# Patient Record
Sex: Male | Born: 1950
Health system: Southern US, Academic
[De-identification: ages and names within clinical notes are randomized; demographics above are authoritative.]

## PROBLEM LIST (undated history)

## (undated) ENCOUNTER — Encounter

## (undated) ENCOUNTER — Encounter: Attending: Family | Primary: Family

## (undated) ENCOUNTER — Ambulatory Visit: Payer: MEDICARE

## (undated) ENCOUNTER — Ambulatory Visit

## (undated) ENCOUNTER — Telehealth

## (undated) ENCOUNTER — Encounter
Attending: Pharmacist Clinician (PhC)/ Clinical Pharmacy Specialist | Primary: Pharmacist Clinician (PhC)/ Clinical Pharmacy Specialist

## (undated) ENCOUNTER — Ambulatory Visit: Payer: MEDICARE | Attending: Family | Primary: Family

## (undated) ENCOUNTER — Telehealth: Attending: Family | Primary: Family

## (undated) ENCOUNTER — Telehealth
Attending: Pharmacist Clinician (PhC)/ Clinical Pharmacy Specialist | Primary: Pharmacist Clinician (PhC)/ Clinical Pharmacy Specialist

## (undated) ENCOUNTER — Ambulatory Visit
Attending: Pharmacist Clinician (PhC)/ Clinical Pharmacy Specialist | Primary: Pharmacist Clinician (PhC)/ Clinical Pharmacy Specialist

## (undated) ENCOUNTER — Encounter: Attending: Internal Medicine | Primary: Internal Medicine

## (undated) ENCOUNTER — Ambulatory Visit: Attending: Family | Primary: Family

## (undated) DIAGNOSIS — K7030 Alcoholic cirrhosis of liver without ascites: Secondary | ICD-10-CM

## (undated) DIAGNOSIS — C649 Malignant neoplasm of unspecified kidney, except renal pelvis: Secondary | ICD-10-CM

## (undated) DIAGNOSIS — G4730 Sleep apnea, unspecified: Secondary | ICD-10-CM

## (undated) DIAGNOSIS — I2510 Atherosclerotic heart disease of native coronary artery without angina pectoris: Secondary | ICD-10-CM

## (undated) DIAGNOSIS — N289 Disorder of kidney and ureter, unspecified: Secondary | ICD-10-CM

## (undated) DIAGNOSIS — D696 Thrombocytopenia, unspecified: Secondary | ICD-10-CM

## (undated) DIAGNOSIS — C801 Malignant (primary) neoplasm, unspecified: Secondary | ICD-10-CM

## (undated) DIAGNOSIS — D691 Qualitative platelet defects: Secondary | ICD-10-CM

## (undated) DIAGNOSIS — K7460 Unspecified cirrhosis of liver: Secondary | ICD-10-CM

## (undated) DIAGNOSIS — I10 Essential (primary) hypertension: Secondary | ICD-10-CM

## (undated) DIAGNOSIS — E079 Disorder of thyroid, unspecified: Secondary | ICD-10-CM

## (undated) DIAGNOSIS — J449 Chronic obstructive pulmonary disease, unspecified: Secondary | ICD-10-CM

## (undated) HISTORY — DX: Thrombocytopenia, unspecified: D69.6

## (undated) HISTORY — PX: TESTICLE REMOVAL: SHX68

## (undated) HISTORY — PX: OTHER SURGICAL HISTORY: SHX169

## (undated) MED ORDER — FUROSEMIDE 40 MG TABLET
Freq: Every day | ORAL | 0.00000 days
Start: ? — End: 2020-08-10

## (undated) MED ORDER — OLMESARTAN 5 MG TABLET
0 days
Start: ? — End: 2020-07-27

---
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
## 1998-02-18 ENCOUNTER — Ambulatory Visit (HOSPITAL_COMMUNITY): Admission: RE | Admit: 1998-02-18 | Discharge: 1998-02-18 | Payer: Self-pay | Admitting: *Deleted

## 2000-07-04 ENCOUNTER — Encounter: Payer: Self-pay | Admitting: *Deleted

## 2000-07-04 ENCOUNTER — Encounter: Admission: RE | Admit: 2000-07-04 | Discharge: 2000-07-04 | Payer: Self-pay | Admitting: *Deleted

## 2004-11-16 ENCOUNTER — Encounter: Admission: RE | Admit: 2004-11-16 | Discharge: 2004-11-16 | Payer: Self-pay | Admitting: Gastroenterology

## 2005-05-31 ENCOUNTER — Encounter: Admission: RE | Admit: 2005-05-31 | Discharge: 2005-05-31 | Payer: Self-pay | Admitting: Family Medicine

## 2010-05-08 ENCOUNTER — Ambulatory Visit: Payer: Self-pay | Admitting: Oncology

## 2010-05-18 LAB — CBC WITH DIFFERENTIAL/PLATELET
BASO%: 0.6 % (ref 0.0–2.0)
Basophils Absolute: 0 10*3/uL (ref 0.0–0.1)
EOS%: 2.1 % (ref 0.0–7.0)
Eosinophils Absolute: 0.1 10*3/uL (ref 0.0–0.5)
HCT: 41 % (ref 38.4–49.9)
HGB: 14.5 g/dL (ref 13.0–17.1)
LYMPH%: 43.7 % (ref 14.0–49.0)
MCH: 34.5 pg — ABNORMAL HIGH (ref 27.2–33.4)
MCHC: 35.4 g/dL (ref 32.0–36.0)
MCV: 97.5 fL (ref 79.3–98.0)
MONO#: 0.4 10*3/uL (ref 0.1–0.9)
MONO%: 15.9 % — ABNORMAL HIGH (ref 0.0–14.0)
NEUT#: 1 10*3/uL — ABNORMAL LOW (ref 1.5–6.5)
NEUT%: 37.7 % — ABNORMAL LOW (ref 39.0–75.0)
Platelets: 75 10*3/uL — ABNORMAL LOW (ref 140–400)
RBC: 4.21 10*6/uL (ref 4.20–5.82)
RDW: 13.2 % (ref 11.0–14.6)
WBC: 2.7 10*3/uL — ABNORMAL LOW (ref 4.0–10.3)
lymph#: 1.2 10*3/uL (ref 0.9–3.3)

## 2010-05-18 LAB — PROTHROMBIN TIME
INR: 1.04 (ref <1.50)
Prothrombin Time: 13.8 seconds (ref 11.6–15.2)

## 2010-05-18 LAB — COMPREHENSIVE METABOLIC PANEL
AST: 46 U/L — ABNORMAL HIGH (ref 0–37)
Alkaline Phosphatase: 128 U/L — ABNORMAL HIGH (ref 39–117)
BUN: 12 mg/dL (ref 6–23)
Calcium: 8.9 mg/dL (ref 8.4–10.5)
Chloride: 105 mEq/L (ref 96–112)
Creatinine, Ser: 1.12 mg/dL (ref 0.40–1.50)
Total Bilirubin: 0.7 mg/dL (ref 0.3–1.2)

## 2010-05-18 LAB — APTT: aPTT: 36 seconds (ref 24–37)

## 2010-05-18 LAB — CHCC SMEAR

## 2010-05-25 ENCOUNTER — Ambulatory Visit (HOSPITAL_COMMUNITY): Admission: RE | Admit: 2010-05-25 | Discharge: 2010-05-25 | Payer: Self-pay | Admitting: Oncology

## 2011-10-05 ENCOUNTER — Other Ambulatory Visit: Payer: Self-pay | Admitting: *Deleted

## 2011-10-05 ENCOUNTER — Other Ambulatory Visit: Payer: Self-pay | Admitting: Oncology

## 2011-10-05 ENCOUNTER — Ambulatory Visit: Payer: Self-pay

## 2011-10-05 DIAGNOSIS — D649 Anemia, unspecified: Secondary | ICD-10-CM

## 2011-12-05 ENCOUNTER — Telehealth: Payer: Self-pay | Admitting: Oncology

## 2011-12-05 NOTE — Telephone Encounter (Signed)
S/w pt re appt for 5/21 @ 3 pm w/FS. Re-establish care - 30 min.

## 2011-12-11 ENCOUNTER — Other Ambulatory Visit (HOSPITAL_BASED_OUTPATIENT_CLINIC_OR_DEPARTMENT_OTHER): Payer: Self-pay | Admitting: Lab

## 2011-12-11 ENCOUNTER — Ambulatory Visit: Payer: Self-pay

## 2011-12-11 ENCOUNTER — Telehealth: Payer: Self-pay | Admitting: Oncology

## 2011-12-11 ENCOUNTER — Encounter: Payer: Self-pay | Admitting: Oncology

## 2011-12-11 ENCOUNTER — Ambulatory Visit (HOSPITAL_BASED_OUTPATIENT_CLINIC_OR_DEPARTMENT_OTHER): Payer: Self-pay | Admitting: Oncology

## 2011-12-11 ENCOUNTER — Other Ambulatory Visit: Payer: Self-pay | Admitting: Oncology

## 2011-12-11 VITALS — BP 161/83 | HR 80 | Temp 98.0°F | Ht 68.0 in | Wt 150.1 lb

## 2011-12-11 DIAGNOSIS — D696 Thrombocytopenia, unspecified: Secondary | ICD-10-CM

## 2011-12-11 DIAGNOSIS — D709 Neutropenia, unspecified: Secondary | ICD-10-CM

## 2011-12-11 DIAGNOSIS — R7989 Other specified abnormal findings of blood chemistry: Secondary | ICD-10-CM

## 2011-12-11 HISTORY — DX: Thrombocytopenia, unspecified: D69.6

## 2011-12-11 LAB — CBC WITH DIFFERENTIAL/PLATELET
Eosinophils Absolute: 0 10*3/uL (ref 0.0–0.5)
HGB: 13.8 g/dL (ref 13.0–17.1)
LYMPH%: 40.3 % (ref 14.0–49.0)
MONO#: 0.6 10*3/uL (ref 0.1–0.9)
NEUT#: 1.6 10*3/uL (ref 1.5–6.5)
Platelets: 64 10*3/uL — ABNORMAL LOW (ref 140–400)
RBC: 4.05 10*6/uL — ABNORMAL LOW (ref 4.20–5.82)
WBC: 3.8 10*3/uL — ABNORMAL LOW (ref 4.0–10.3)
nRBC: 0 % (ref 0–0)

## 2011-12-11 LAB — COMPREHENSIVE METABOLIC PANEL
ALT: 33 U/L (ref 0–53)
CO2: 20 mEq/L (ref 19–32)
Calcium: 8.8 mg/dL (ref 8.4–10.5)
Chloride: 103 mEq/L (ref 96–112)
Creatinine, Ser: 1.28 mg/dL (ref 0.50–1.35)
Sodium: 134 mEq/L — ABNORMAL LOW (ref 135–145)
Total Protein: 7.4 g/dL (ref 6.0–8.3)

## 2011-12-11 LAB — CHCC SMEAR

## 2011-12-11 NOTE — Telephone Encounter (Signed)
appts made and printed for pt aom °

## 2011-12-11 NOTE — Progress Notes (Signed)
Hematology and Oncology Follow Up Visit  NAHJEE KARSTETTER [id number] May 28, 1951 61 y.o. 12/11/2011 4:19 PM   Principle Diagnosis: This is a 61 year old gentleman with thrombocytopenia and neutropenia most likely related to his chronic liver disease.   Interim History: Mr. Hoobler presents today for a follow-up visit.  I saw him for the first time on 04/2010 to evaluation for chronic fluctuating thrombocytopenia and mild neutropenia.  Upon my evaluation at that time, his platelet counts was 75.  His absolute neutrophil count was around 1000.  His coagulation parameters all within normal range.  His chemistries showed his AST is slightly elevated at 46 and his alkaline phosphatase is elevated at 128 and also his workup included abdominal ultrasound was done on 11/03/2011the ultrasound did not show any evidence of any splenic abnormalities.  There is no acute process.  He still has a mildly heterogenous appearance of the hepatic parenchyma without any focal abnormalities.  The size of the spleen is measuring 8 x 9 x 4 cm.  Mr. Egeland has continued to be asymptomatic from this.  He has not had any bleeding.  He did not report any major complaints.  He has not had any opportunistic infection.  He still drinks about three to four drinks at night predominantly wine. No bleeding noted since the last visit.   Medications: I have reviewed the patient's current medications. Current outpatient prescriptions:diltiazem (CARDIZEM) 90 MG tablet, Take 90 mg by mouth 2 (two) times daily., Disp: , Rfl: ;  levothyroxine (SYNTHROID, LEVOTHROID) 50 MCG tablet, Take 50 mcg by mouth Daily., Disp: , Rfl: ;  lisinopril (PRINIVIL,ZESTRIL) 40 MG tablet, Take 40 mg by mouth daily., Disp: , Rfl:   Allergies: No Known Allergies  Past Medical History, Surgical history, Social history, and Family History were reviewed and updated.  Review of Systems: Constitutional:  Negative for fever, chills, night sweats, anorexia, weight loss,  pain. Cardiovascular: no chest pain or dyspnea on exertion Respiratory: negative Neurological: negative Dermatological: negative ENT: negative Skin: Negative. Gastrointestinal: negative Genito-Urinary: negative Hematological and Lymphatic: negative Breast: negative Musculoskeletal: negative Remaining ROS negative. Physical Exam: Blood pressure 161/83, pulse 80, temperature 98 F (36.7 C), temperature source Oral, height 5\' 8"  (1.727 m), weight 150 lb 1.6 oz (68.085 kg). ECOG: 1 General appearance: alert Head: Normocephalic, without obvious abnormality, atraumatic Neck: no adenopathy, no carotid bruit, no JVD, supple, symmetrical, trachea midline and thyroid not enlarged, symmetric, no tenderness/mass/nodules Lymph nodes: Cervical, supraclavicular, and axillary nodes normal. Heart:regular rate and rhythm, S1, S2 normal, no murmur, click, rub or gallop Lung:chest clear, no wheezing, rales, normal symmetric air entry Abdomin: soft, non-tender, without masses or organomegaly EXT:no erythema, induration, or nodules   Lab Results: Lab Results  Component Value Date   WBC 3.8* 12/11/2011   HGB 13.8 12/11/2011   HCT 40.3 12/11/2011   MCV 99.5* 12/11/2011   PLT 64* 12/11/2011     Chemistry      Component Value Date/Time   NA 135 05/18/2010 1033   K 3.6 05/18/2010 1033   CL 105 05/18/2010 1033   CO2 22 05/18/2010 1033   BUN 12 05/18/2010 1033   CREATININE 1.12 05/18/2010 1033      Component Value Date/Time   CALCIUM 8.9 05/18/2010 1033   ALKPHOS 128* 05/18/2010 1033   AST 46* 05/18/2010 1033   ALT 41 05/18/2010 1033   BILITOT 0.7 05/18/2010 1033       Impression and Plan:  This is a pleasant 61 year old gentleman with the following issues:  1.  Mild neutropenia and thrombocytopenia.  The etiology at this time most likely related to his chronic alcohol consumption presumably autoimmune phenomenon as well as chronic myelosuppression related to alcohol or any other medications.   Other conditions such as myelodysplastic syndrome are unlikeliy.  For the time being we will continue observe him closely and if his counts show any deterioration, we will consider a bone marrow biopsy and for the time being I instructed him to try to cut down his drinks as much as possible and see if there is any improvement in his myelosuppression and I will follow up with him in about 6 months.   2.  Elevation in liver function test likely related to liver disease.   Zola Button, MD 5/21/20134:19 PM

## 2012-06-10 ENCOUNTER — Ambulatory Visit: Payer: PRIVATE HEALTH INSURANCE | Admitting: Oncology

## 2012-06-10 ENCOUNTER — Other Ambulatory Visit: Payer: PRIVATE HEALTH INSURANCE | Admitting: Lab

## 2014-03-10 ENCOUNTER — Other Ambulatory Visit: Payer: Self-pay | Admitting: Gastroenterology

## 2014-03-10 DIAGNOSIS — R7989 Other specified abnormal findings of blood chemistry: Secondary | ICD-10-CM

## 2014-03-10 DIAGNOSIS — R945 Abnormal results of liver function studies: Principal | ICD-10-CM

## 2014-03-16 ENCOUNTER — Ambulatory Visit
Admission: RE | Admit: 2014-03-16 | Discharge: 2014-03-16 | Disposition: A | Discharge status: Home / Self Care | Payer: Medicare Other | Source: Ambulatory Visit | Attending: Gastroenterology | Admitting: Gastroenterology

## 2014-03-16 DIAGNOSIS — R945 Abnormal results of liver function studies: Principal | ICD-10-CM

## 2014-03-16 DIAGNOSIS — R7989 Other specified abnormal findings of blood chemistry: Secondary | ICD-10-CM

## 2014-03-17 ENCOUNTER — Other Ambulatory Visit: Payer: Self-pay | Admitting: Gastroenterology

## 2014-03-17 DIAGNOSIS — R935 Abnormal findings on diagnostic imaging of other abdominal regions, including retroperitoneum: Secondary | ICD-10-CM

## 2014-03-24 ENCOUNTER — Ambulatory Visit
Admission: RE | Admit: 2014-03-24 | Discharge: 2014-03-24 | Disposition: A | Discharge status: Home / Self Care | Payer: Medicare Other | Source: Ambulatory Visit | Attending: Gastroenterology | Admitting: Gastroenterology

## 2014-03-24 DIAGNOSIS — R935 Abnormal findings on diagnostic imaging of other abdominal regions, including retroperitoneum: Secondary | ICD-10-CM

## 2014-03-24 MED ORDER — GADOBENATE DIMEGLUMINE 529 MG/ML IV SOLN
14.0000 mL | Freq: Once | INTRAVENOUS | Status: AC | PRN
  Administered 2014-03-24: 14 mL via INTRAVENOUS

## 2014-07-02 DIAGNOSIS — N179 Acute kidney failure, unspecified: Secondary | ICD-10-CM | POA: Insufficient documentation

## 2014-07-02 DIAGNOSIS — I10 Essential (primary) hypertension: Secondary | ICD-10-CM | POA: Insufficient documentation

## 2014-07-02 DIAGNOSIS — E039 Hypothyroidism, unspecified: Secondary | ICD-10-CM | POA: Insufficient documentation

## 2014-07-20 ENCOUNTER — Emergency Department (HOSPITAL_COMMUNITY)
Admission: EM | Admit: 2014-07-20 | Discharge: 2014-07-20 | Disposition: A | Discharge status: Home / Self Care | Payer: Medicare Other | Attending: Emergency Medicine | Admitting: Emergency Medicine

## 2014-07-20 ENCOUNTER — Emergency Department (HOSPITAL_COMMUNITY): Payer: Medicare Other

## 2014-07-20 ENCOUNTER — Encounter (HOSPITAL_COMMUNITY): Payer: Self-pay | Admitting: Emergency Medicine

## 2014-07-20 DIAGNOSIS — Z79899 Other long term (current) drug therapy: Secondary | ICD-10-CM | POA: Diagnosis not present

## 2014-07-20 DIAGNOSIS — Y836 Removal of other organ (partial) (total) as the cause of abnormal reaction of the patient, or of later complication, without mention of misadventure at the time of the procedure: Secondary | ICD-10-CM | POA: Diagnosis not present

## 2014-07-20 DIAGNOSIS — Z792 Long term (current) use of antibiotics: Secondary | ICD-10-CM | POA: Diagnosis not present

## 2014-07-20 DIAGNOSIS — E079 Disorder of thyroid, unspecified: Secondary | ICD-10-CM | POA: Insufficient documentation

## 2014-07-20 DIAGNOSIS — L24A9 Irritant contact dermatitis due friction or contact with other specified body fluids: Secondary | ICD-10-CM

## 2014-07-20 DIAGNOSIS — Z8669 Personal history of other diseases of the nervous system and sense organs: Secondary | ICD-10-CM | POA: Diagnosis not present

## 2014-07-20 DIAGNOSIS — T148XXA Other injury of unspecified body region, initial encounter: Secondary | ICD-10-CM

## 2014-07-20 DIAGNOSIS — Z8719 Personal history of other diseases of the digestive system: Secondary | ICD-10-CM | POA: Insufficient documentation

## 2014-07-20 DIAGNOSIS — I10 Essential (primary) hypertension: Secondary | ICD-10-CM | POA: Diagnosis not present

## 2014-07-20 DIAGNOSIS — Z87448 Personal history of other diseases of urinary system: Secondary | ICD-10-CM | POA: Insufficient documentation

## 2014-07-20 DIAGNOSIS — I2510 Atherosclerotic heart disease of native coronary artery without angina pectoris: Secondary | ICD-10-CM | POA: Insufficient documentation

## 2014-07-20 DIAGNOSIS — T8189XA Other complications of procedures, not elsewhere classified, initial encounter: Secondary | ICD-10-CM | POA: Diagnosis not present

## 2014-07-20 DIAGNOSIS — Z85528 Personal history of other malignant neoplasm of kidney: Secondary | ICD-10-CM | POA: Diagnosis not present

## 2014-07-20 HISTORY — DX: Qualitative platelet defects: D69.1

## 2014-07-20 HISTORY — DX: Unspecified cirrhosis of liver: K74.60

## 2014-07-20 HISTORY — DX: Essential (primary) hypertension: I10

## 2014-07-20 HISTORY — DX: Disorder of kidney and ureter, unspecified: N28.9

## 2014-07-20 HISTORY — DX: Malignant (primary) neoplasm, unspecified: C80.1

## 2014-07-20 HISTORY — DX: Disorder of thyroid, unspecified: E07.9

## 2014-07-20 HISTORY — DX: Atherosclerotic heart disease of native coronary artery without angina pectoris: I25.10

## 2014-07-20 HISTORY — DX: Chronic obstructive pulmonary disease, unspecified: J44.9

## 2014-07-20 HISTORY — DX: Sleep apnea, unspecified: G47.30

## 2014-07-20 LAB — CBC
HEMATOCRIT: 27.4 % — AB (ref 39.0–52.0)
Hemoglobin: 8.7 g/dL — ABNORMAL LOW (ref 13.0–17.0)
MCH: 30.2 pg (ref 26.0–34.0)
MCHC: 31.8 g/dL (ref 30.0–36.0)
MCV: 95.1 fL (ref 78.0–100.0)
PLATELETS: 238 10*3/uL (ref 150–400)
RBC: 2.88 MIL/uL — ABNORMAL LOW (ref 4.22–5.81)
RDW: 16.8 % — AB (ref 11.5–15.5)
WBC: 8.8 10*3/uL (ref 4.0–10.5)

## 2014-07-20 LAB — BASIC METABOLIC PANEL
Anion gap: 2 — ABNORMAL LOW (ref 5–15)
BUN: 27 mg/dL — ABNORMAL HIGH (ref 6–23)
CO2: 18 mmol/L — ABNORMAL LOW (ref 19–32)
Calcium: 7.7 mg/dL — ABNORMAL LOW (ref 8.4–10.5)
Chloride: 116 mEq/L — ABNORMAL HIGH (ref 96–112)
Creatinine, Ser: 2.29 mg/dL — ABNORMAL HIGH (ref 0.50–1.35)
GFR, EST AFRICAN AMERICAN: 33 mL/min — AB (ref >90)
GFR, EST NON AFRICAN AMERICAN: 28 mL/min — AB (ref >90)
Glucose, Bld: 95 mg/dL (ref 70–99)
POTASSIUM: 4.6 mmol/L (ref 3.5–5.1)
Sodium: 136 mmol/L (ref 135–145)

## 2014-07-20 NOTE — ED Notes (Signed)
Notified [facility] of transportation to for patient back to facility . Provided patient a ham sandwich and ice coke. Informed patient and pt's spouse about the wait for transportation.

## 2014-07-20 NOTE — Progress Notes (Signed)
CSW spoke with Heather(Social Worker) at [facility], who states that the patient's bed will be held until midnight.   Willette Brace [phone] ED CSW 07/20/2014 6:32 PM

## 2014-07-20 NOTE — ED Notes (Addendum)
Per EMS pt comes from [place] where he was new admit on yesterday for PT rehab to regain his strength after going through kidney removal at [facility].  Pt has JP drain sites that are draining clear fluid since they were removed.  Pt saturated 7 ABD pads over 16 hours. [facility] facility sent pt for evaluation of drainage and told EMS that they are discharging patient from there facility due not being able to provide this type of care.  Pt doesn't know where he is supposed to go after being discharged from our facility.

## 2014-07-20 NOTE — ED Notes (Signed)
Bed: [id number] Expected date:  Expected time:  Means of arrival:  Comments: EMS- abdominal drainage, recent procedure

## 2014-07-20 NOTE — ED Provider Notes (Signed)
CSN: [id number]     Arrival date & time 07/20/14  1402 History   First MD Initiated Contact with Patient 07/20/14 1504     Chief Complaint  Patient presents with  . Post-op Problem  . abd drainage      (Consider location/radiation/quality/duration/timing/severity/associated sxs/prior Treatment) Patient is a 63 y.o. male presenting with general illness. The history is provided by the patient.  Illness Location:  Abdomen Quality:  Drainage Severity:  Moderate Onset quality:  Gradual Timing:  Constant Progression:  Unchanged Chronicity:  New Context:  Had JP drains removed after nephrectomy, is now having moderate amount of serous drainage Relieved by:  Nothing Worsened by:  Nothing Associated symptoms: no abdominal pain, no cough, no fever, no nausea, no shortness of breath and no vomiting     Past Medical History  Diagnosis Date  . Thrombocytopenia 12/11/2011  . Cancer     kidney cancer  . Thyroid disease   . Hypertension   . Coronary artery disease   . COPD (chronic obstructive pulmonary disease)   . Sleep apnea   . Renal disorder   . Cirrhosis   . Thrombocytopathia    Past Surgical History  Procedure Laterality Date  . Kidney removed Right   . Testicle removal     No family history on file. History  Substance Use Topics  . Smoking status: Never Smoker   . Smokeless tobacco: Never Used  . Alcohol Use: No    Review of Systems  Constitutional: Negative for fever.  Respiratory: Negative for cough and shortness of breath.   Gastrointestinal: Negative for nausea, vomiting and abdominal pain.  All other systems reviewed and are negative.     Allergies  Review of patient's allergies indicates no known allergies.  Home Medications   Prior to Admission medications   Medication Sig Start Date End Date Taking? Authorizing Provider  acetaminophen (TYLENOL) 160 MG/5ML liquid Take 650 mg by mouth every 4 (four) hours as needed for pain.   Yes Historical  Provider, MD  cholestyramine Lucrezia Starch) 4 G packet Take 4 g by mouth 2 (two) times daily.   Yes Historical Provider, MD  ciprofloxacin (CIPRO) 500 MG tablet Take 500 mg by mouth 2 (two) times daily.   Yes Historical Provider, MD  docusate sodium (COLACE) 100 MG capsule Take 100 mg by mouth 2 (two) times daily.   Yes Historical Provider, MD  levothyroxine (SYNTHROID, LEVOTHROID) 50 MCG tablet Take 50 mcg by mouth Daily. 11/20/11  Yes Historical Provider, MD  metroNIDAZOLE (FLAGYL) 500 MG tablet Take 500 mg by mouth 3 (three) times daily.   Yes Historical Provider, MD  oxyCODONE (OXY IR/ROXICODONE) 5 MG immediate release tablet Take 5 mg by mouth every 4 (four) hours as needed for severe pain.   Yes Historical Provider, MD  pantoprazole (PROTONIX) 40 MG tablet Take 40 mg by mouth daily.   Yes Historical Provider, MD  senna (SENOKOT) 8.6 MG tablet Take 1 tablet by mouth at bedtime.   Yes Historical Provider, MD  tamsulosin (FLOMAX) 0.4 MG CAPS capsule Take 0.4 mg by mouth daily.   Yes Historical Provider, MD   BP 142/73 mmHg  Temp(Src) 98.2 F (36.8 C) (Oral)  Resp 14  SpO2 100% Physical Exam  Constitutional: He is oriented to person, place, and time. He appears well-developed and well-nourished. No distress.  HENT:  Head: Normocephalic and atraumatic.  Mouth/Throat: No oropharyngeal exudate.  Eyes: EOM are normal. Pupils are equal, round, and reactive to light.  Neck:  Normal range of motion. Neck supple.  Cardiovascular: Normal rate and regular rhythm.  Exam reveals no friction rub.   No murmur heard. Pulmonary/Chest: Effort normal and breath sounds normal. No respiratory distress. He has no wheezes. He has no rales.  Abdominal: He exhibits no distension. There is no tenderness. There is no rebound.    Musculoskeletal: Normal range of motion. He exhibits no edema.  Neurological: He is alert and oriented to person, place, and time.  Skin: He is not diaphoretic.    ED Course  Procedures  (including critical care time) Labs Review Labs Reviewed  CBC - Abnormal; Notable for the following:    RBC 2.88 (*)    Hemoglobin 8.7 (*)    HCT 27.4 (*)    RDW 16.8 (*)    All other components within normal limits  BASIC METABOLIC PANEL - Abnormal; Notable for the following:    Chloride 116 (*)    CO2 18 (*)    BUN 27 (*)    Creatinine, Ser 2.29 (*)    Calcium 7.7 (*)    GFR calc non Af Amer 28 (*)    GFR calc Af Amer 33 (*)    Anion gap 2 (*)    All other components within normal limits    Imaging Review Ct Abdomen Pelvis Wo Contrast  07/20/2014   CLINICAL DATA:  Recent right nephrectomy. Clear fluid draining from prior JP drain site.  EXAM: CT ABDOMEN AND PELVIS WITHOUT CONTRAST  TECHNIQUE: Multidetector CT imaging of the abdomen and pelvis was performed following the standard protocol without IV contrast.  COMPARISON:  03/24/2014  FINDINGS: Lower chest: No pleural effusion identified. Subsegmental atelectasis is noted within both lung bases.  Hepatobiliary: There are morphologic features of the liver compatible with cirrhosis. The gallbladder is unremarkable. No biliary dilatation.  Pancreas: Appears normal.  Spleen: Appears normal.  Adrenals/Urinary Tract: Left adrenal gland and left kidney are unremarkable. The right adrenal gland is normal. Postoperative changes from right nephrectomy identified. Low-attenuation fluid within the nephrectomy bed measures 2.8 x 5.9 x7.9 cm. This may be loculated. The urinary bladder appears normal.  Stomach/Bowel: The stomach and the small bowel loops have a normal appearance. No evidence for bowel obstruction or ileus. The appendix is visualized and appears normal. Normal appearance of the colon with mild stool burden.  Vascular/Lymphatic: Calcified atherosclerotic disease involves the abdominal aorta. No aneurysm. No enlarged retroperitoneal or mesenteric adenopathy. No enlarged pelvic or inguinal lymph nodes.  Reproductive: Prostate gland and seminal  vesicles are unremarkable.  Other: There is a mild amount of ascites overlying the right hepatic lobe, image number 16/series 2.  Musculoskeletal: Review of the visualized bony structures is unremarkable. No aggressive lytic or sclerotic bone lesions noted.  IMPRESSION: 1. Postoperative changes from right nephrectomy. 2. Nonspecific, low-attenuation fluid collection identified within the right nephrectomy site. This may represent postoperative seroma, hematoma or ascites secondary to patient's known cirrhosis. 3. Cirrhosis. A small amount of fluid extends over the anterior aspect of the liver which is likely ascites.   Electronically Signed   By: Kerby Moors M.D.   On: 07/20/2014 18:46     EKG Interpretation None      MDM   Final diagnoses:  Drainage from wound    89M s/p nephrectomy here from [place]. Had JP drains removed from his surgical wounds, and is now having some serous drainage from the wound to the R of his umbilicus. No purulent drainage. No pain, fever,  vomiting. JP was pulled yesterday, drainage from JP grew out Klebsiella and he is subsequently on cipro, flagyl until 07/29/14. Of note, patient was dischaged from his nursing home since he was having the drainage and his nursing home was unable to care for that. Social work consulted.  CT shows fluid collection in nephrectomy bed, otherwise ok. No evidence of intra-abdominal infection noted, but scan was noncontrasted. No white count, no fevers, no abdominal pain.  I spoke with Dr. Glo Herring at [facility] with Urology, who stated this draining JP site will close on it's own with patient's medium-chain fatty acid diet and with time.   I spoke directly with [facility] who was comfortable receiving patient back.  Patient can f/u with [facility] in 2 weeks as scheduled.   I have reviewed all labs and imaging and considered them in my medical decision making.   Evelina Bucy, MD 07/21/14 [phone]

## 2014-07-20 NOTE — ED Notes (Signed)
This Agricultural consultant received a call from the facility.  Sts "I think, they removed the drains too soon.  He saturated several ABD pads last night and two this morning."

## 2014-07-20 NOTE — ED Notes (Signed)
Patients abd pain that located on his right lower abd was saturated with clear liquid. Removed bandage. Using clean technique, applied an abd pad to right lower abd. Secured with paper tape. Pt tolerated it well.

## 2014-07-20 NOTE — ED Notes (Signed)
Pt's wife stated that she just got off the phone with [facility], as long as pt is discharge with no serious problems causing the drainage and can be back at [facility] before 12am, then pt can go back to their facility.

## 2014-07-20 NOTE — Progress Notes (Signed)
CSW met with patient at bedside. Wife was present. Per wife, patient has been accepted in [facility]. However, patient states he has concerns about one of their nurses. CSW called [facility] to speak with the social worker on duty. However, there was no answer. CSW left voicemail informing social worker to call back.  Willette Brace [phone] ED CSW 07/20/2014 5:56 PM

## 2014-07-21 NOTE — Progress Notes (Signed)
  CARE MANAGEMENT ED NOTE 07/21/2014  Patient:  Stephen Grimes, Stephen Grimes   Account Number:  [id number]  Date Initiated:  07/21/2014  Documentation initiated by:  Livia Snellen  Subjective/Objective Assessment:   Patient presents to Ed with increased abdominal drainage post drain removal     Subjective/Objective Assessment Detail:     Action/Plan:   Action/Plan Detail:   Anticipated DC Date:  07/21/2014     Status Recommendation to Physician:   Result of Recommendation:    Other ED [place]  Other  PCP issues    Choice offered to / List presented to:            Status of service:  Completed, signed off  ED Comments:   ED Comments Detail:  Patient from [facility].  Patent reports his pcp is Dr. Jerilynn Birkenhead of [facility]. System updated.

## 2014-07-22 ENCOUNTER — Emergency Department (HOSPITAL_COMMUNITY)
Admission: EM | Admit: 2014-07-22 | Discharge: 2014-07-22 | Disposition: A | Discharge status: Home / Self Care | Payer: Medicare Other | Attending: Emergency Medicine | Admitting: Emergency Medicine

## 2014-07-22 ENCOUNTER — Encounter (HOSPITAL_COMMUNITY): Payer: Self-pay | Admitting: Emergency Medicine

## 2014-07-22 DIAGNOSIS — Z8719 Personal history of other diseases of the digestive system: Secondary | ICD-10-CM | POA: Diagnosis not present

## 2014-07-22 DIAGNOSIS — Z792 Long term (current) use of antibiotics: Secondary | ICD-10-CM | POA: Diagnosis not present

## 2014-07-22 DIAGNOSIS — Z79899 Other long term (current) drug therapy: Secondary | ICD-10-CM | POA: Diagnosis not present

## 2014-07-22 DIAGNOSIS — Z905 Acquired absence of kidney: Secondary | ICD-10-CM | POA: Diagnosis not present

## 2014-07-22 DIAGNOSIS — Z8669 Personal history of other diseases of the nervous system and sense organs: Secondary | ICD-10-CM | POA: Insufficient documentation

## 2014-07-22 DIAGNOSIS — T814XXD Infection following a procedure, subsequent encounter: Secondary | ICD-10-CM | POA: Insufficient documentation

## 2014-07-22 DIAGNOSIS — Z862 Personal history of diseases of the blood and blood-forming organs and certain disorders involving the immune mechanism: Secondary | ICD-10-CM | POA: Diagnosis not present

## 2014-07-22 DIAGNOSIS — J449 Chronic obstructive pulmonary disease, unspecified: Secondary | ICD-10-CM | POA: Diagnosis not present

## 2014-07-22 DIAGNOSIS — Y836 Removal of other organ (partial) (total) as the cause of abnormal reaction of the patient, or of later complication, without mention of misadventure at the time of the procedure: Secondary | ICD-10-CM | POA: Diagnosis not present

## 2014-07-22 DIAGNOSIS — Z85528 Personal history of other malignant neoplasm of kidney: Secondary | ICD-10-CM | POA: Diagnosis not present

## 2014-07-22 DIAGNOSIS — E079 Disorder of thyroid, unspecified: Secondary | ICD-10-CM | POA: Insufficient documentation

## 2014-07-22 DIAGNOSIS — I10 Essential (primary) hypertension: Secondary | ICD-10-CM | POA: Insufficient documentation

## 2014-07-22 DIAGNOSIS — I2510 Atherosclerotic heart disease of native coronary artery without angina pectoris: Secondary | ICD-10-CM | POA: Diagnosis not present

## 2014-07-22 DIAGNOSIS — Z87448 Personal history of other diseases of urinary system: Secondary | ICD-10-CM | POA: Insufficient documentation

## 2014-07-22 DIAGNOSIS — T8189XD Other complications of procedures, not elsewhere classified, subsequent encounter: Secondary | ICD-10-CM

## 2014-07-22 NOTE — Discharge Instructions (Signed)
The patient's dressing needs to be a bulky absorbent dressing may need to be changed 2-3 times per shift.  There is no signs of infection at this time.  The patient will need to follow up with his doctors at [facility] as well

## 2014-07-22 NOTE — ED Notes (Signed)
Pt from [facility] on [address]. States he had R kidney removed recently due to kidney cancer and his JP drain has been putting out more fluid than expected. Sent in for eval. Alert and oriented.

## 2014-07-22 NOTE — ED Notes (Addendum)
Called and gave report to  Hurley, LPN at [facility]. Verbalized understanding.

## 2014-07-22 NOTE — ED Notes (Signed)
Awake. Verbally responsive. A/O x4. Resp even and unlabored. No audible adventitious breath sounds noted. ABC's intact. Bulky dsg intact. Family at bedside.

## 2014-07-22 NOTE — ED Notes (Signed)
Awake. Verbally responsive. A/O x4. Resp even and unlabored. No audible adventitious breath sounds noted. ABC's intact. NAD noted. 

## 2014-07-22 NOTE — ED Provider Notes (Signed)
CSN: [id number]     Arrival date & time 07/22/14  1744 History   First MD Initiated Contact with Patient 07/22/14 1809     Chief Complaint  Patient presents with  . Post-op Problem     (Consider location/radiation/quality/duration/timing/severity/associated sxs/prior Treatment) HPI Patient was sent back to the emergency department for reevaluation of a draining a wound to his abdomen.  The patient had a recent removal of a kidney and he had a Jackson-Pratt drain in that area is still draining a small amount of clear fluid.  The staff at the nursing facility feel like it is draining too much and they are uncomfortable A.  She has no complaints at this time.  He denies fevers, nausea, vomiting, chest pain, shortness breath, weakness, dizziness, headache, blurred vision, or syncope.  They do not change the dressing as often as they stated they did patient states that is due to follow-up at [name] on January 15 Past Medical History  Diagnosis Date  . Thrombocytopenia 12/11/2011  . Cancer     kidney cancer  . Thyroid disease   . Hypertension   . Coronary artery disease   . COPD (chronic obstructive pulmonary disease)   . Sleep apnea   . Renal disorder   . Cirrhosis   . Thrombocytopathia    Past Surgical History  Procedure Laterality Date  . Kidney removed Right   . Testicle removal     History reviewed. No pertinent family history. History  Substance Use Topics  . Smoking status: Never Smoker   . Smokeless tobacco: Never Used  . Alcohol Use: No    Review of Systems All other systems negative except as documented in the HPI. All pertinent positives and negatives as reviewed in the HPI.   Allergies  Review of patient's allergies indicates no known allergies.  Home Medications   Prior to Admission medications   Medication Sig Start Date End Date Taking? Authorizing Provider  acetaminophen (TYLENOL) 160 MG/5ML liquid Take 650 mg by mouth every 4 (four) hours as needed for pain.    Yes Historical Provider, MD  cholestyramine Lucrezia Starch) 4 G packet Take 4 g by mouth 2 (two) times daily.   Yes Historical Provider, MD  ciprofloxacin (CIPRO) 500 MG tablet Take 500 mg by mouth 2 (two) times daily.   Yes Historical Provider, MD  docusate sodium (COLACE) 100 MG capsule Take 100 mg by mouth 2 (two) times daily.   Yes Historical Provider, MD  levothyroxine (SYNTHROID, LEVOTHROID) 50 MCG tablet Take 50 mcg by mouth Daily. 11/20/11  Yes Historical Provider, MD  metroNIDAZOLE (FLAGYL) 500 MG tablet Take 500 mg by mouth 3 (three) times daily.   Yes Historical Provider, MD  oxyCODONE (OXY IR/ROXICODONE) 5 MG immediate release tablet Take 5 mg by mouth every 4 (four) hours as needed for severe pain.   Yes Historical Provider, MD  pantoprazole (PROTONIX) 40 MG tablet Take 40 mg by mouth daily.   Yes Historical Provider, MD  senna (SENOKOT) 8.6 MG tablet Take 1 tablet by mouth at bedtime.   Yes Historical Provider, MD  tamsulosin (FLOMAX) 0.4 MG CAPS capsule Take 0.4 mg by mouth daily.   Yes Historical Provider, MD   BP 134/72 mmHg  Pulse 91  Temp(Src) 98.7 F (37.1 C) (Oral)  Resp 16  SpO2 100% Physical Exam  Constitutional: He is oriented to person, place, and time. He appears well-developed and well-nourished. No distress.  HENT:  Head: Normocephalic and atraumatic.  Mouth/Throat: Oropharynx is clear  and moist.  Cardiovascular: Normal rate and regular rhythm.  Exam reveals no gallop and no friction rub.   No murmur heard. Pulmonary/Chest: Effort normal and breath sounds normal. No respiratory distress.  Abdominal: Bowel sounds are normal. There is no tenderness. There is no rigidity, no rebound and no guarding.    Musculoskeletal: Normal range of motion. He exhibits no edema.  Neurological: He is alert and oriented to person, place, and time. He exhibits normal muscle tone. Coordination normal.  Skin: Skin is warm and dry. No erythema.  Psychiatric: He has a normal mood and  affect.    ED Course  Procedures (including critical care time) Labs Review Labs Reviewed - No data to display  Imaging Review No results found. I reviewed the patient's visit 2 nights ago and do not feel this time he warrants any further workup and is well-appearing and in no acute distress.  He has normal vital signs.  I will give explicit instructions have the dressing changed 2-3 times per shift as needed.    Brent General, PA-C 07/22/14 2008  Richarda Blade, MD 07/22/14 2034

## 2014-07-22 NOTE — ED Notes (Signed)
MD at bedside. EDPA CHRIS PRESENT

## 2014-07-22 NOTE — ED Notes (Signed)
Bed: [id number] Expected date:  Expected time:  Means of arrival:  Comments: Ems

## 2016-03-19 DIAGNOSIS — N529 Male erectile dysfunction, unspecified: Secondary | ICD-10-CM | POA: Insufficient documentation

## 2016-03-19 DIAGNOSIS — C649 Malignant neoplasm of unspecified kidney, except renal pelvis: Secondary | ICD-10-CM | POA: Insufficient documentation

## 2016-03-19 DIAGNOSIS — K7030 Alcoholic cirrhosis of liver without ascites: Secondary | ICD-10-CM | POA: Insufficient documentation

## 2016-07-24 ENCOUNTER — Other Ambulatory Visit (HOSPITAL_COMMUNITY): Payer: Self-pay | Admitting: Internal Medicine

## 2016-07-24 DIAGNOSIS — I10 Essential (primary) hypertension: Secondary | ICD-10-CM

## 2016-07-25 ENCOUNTER — Ambulatory Visit (HOSPITAL_COMMUNITY)
Admission: RE | Admit: 2016-07-25 | Discharge: 2016-07-25 | Disposition: A | Discharge status: Home / Self Care | Payer: Medicare Other | Source: Ambulatory Visit | Attending: Family Medicine | Admitting: Family Medicine

## 2016-07-25 DIAGNOSIS — I10 Essential (primary) hypertension: Secondary | ICD-10-CM | POA: Diagnosis not present

## 2016-08-31 DIAGNOSIS — C649 Malignant neoplasm of unspecified kidney, except renal pelvis: Secondary | ICD-10-CM | POA: Insufficient documentation

## 2016-08-31 DIAGNOSIS — C779 Secondary and unspecified malignant neoplasm of lymph node, unspecified: Secondary | ICD-10-CM

## 2017-01-21 ENCOUNTER — Ambulatory Visit
Admission: RE | Admit: 2017-01-21 | Discharge: 2017-01-21 | Disposition: A | Discharge status: Home / Self Care | Payer: MEDICARE

## 2017-01-21 ENCOUNTER — Ambulatory Visit
Admission: RE | Admit: 2017-01-21 | Discharge: 2017-01-21 | Disposition: A | Discharge status: Home / Self Care | Payer: MEDICARE | Attending: Family | Admitting: Family

## 2017-01-21 ENCOUNTER — Ambulatory Visit: Admission: RE | Admit: 2017-01-21 | Discharge: 2017-01-21 | Disposition: A | Discharge status: Home / Self Care

## 2017-01-21 DIAGNOSIS — C779 Secondary and unspecified malignant neoplasm of lymph node, unspecified: Principal | ICD-10-CM

## 2017-01-21 DIAGNOSIS — C649 Malignant neoplasm of unspecified kidney, except renal pelvis: Secondary | ICD-10-CM

## 2017-01-21 MED ORDER — LISINOPRIL 10 MG TABLET: 10 mg | tablet | Freq: Every day | 3 refills | 0 days

## 2017-01-21 MED ORDER — LISINOPRIL 10 MG TABLET
ORAL_TABLET | Freq: Every day | ORAL | 3 refills | 0.00000 days
Start: 2017-01-21 — End: 2017-01-21

## 2017-02-06 ENCOUNTER — Ambulatory Visit
Admission: RE | Admit: 2017-02-06 | Discharge: 2017-02-06 | Disposition: A | Discharge status: Home / Self Care | Payer: MEDICARE

## 2017-02-06 ENCOUNTER — Ambulatory Visit
Admission: RE | Admit: 2017-02-06 | Discharge: 2017-02-06 | Disposition: A | Discharge status: Home / Self Care | Payer: MEDICARE | Attending: Family | Admitting: Family

## 2017-02-06 DIAGNOSIS — C649 Malignant neoplasm of unspecified kidney, except renal pelvis: Secondary | ICD-10-CM

## 2017-02-06 DIAGNOSIS — C779 Secondary and unspecified malignant neoplasm of lymph node, unspecified: Principal | ICD-10-CM

## 2017-02-20 ENCOUNTER — Ambulatory Visit
Admission: RE | Admit: 2017-02-20 | Discharge: 2017-02-20 | Disposition: A | Discharge status: Home / Self Care | Payer: MEDICARE

## 2017-02-20 ENCOUNTER — Ambulatory Visit
Admission: RE | Admit: 2017-02-20 | Discharge: 2017-02-20 | Disposition: A | Discharge status: Home / Self Care | Payer: MEDICARE | Attending: Family | Admitting: Family

## 2017-02-20 DIAGNOSIS — C649 Malignant neoplasm of unspecified kidney, except renal pelvis: Secondary | ICD-10-CM

## 2017-02-20 DIAGNOSIS — C779 Secondary and unspecified malignant neoplasm of lymph node, unspecified: Principal | ICD-10-CM

## 2017-02-20 DIAGNOSIS — Z5112 Encounter for antineoplastic immunotherapy: Principal | ICD-10-CM

## 2017-03-05 ENCOUNTER — Ambulatory Visit: Admission: RE | Admit: 2017-03-05 | Discharge: 2017-03-05 | Disposition: A | Discharge status: Home / Self Care

## 2017-03-05 DIAGNOSIS — C649 Malignant neoplasm of unspecified kidney, except renal pelvis: Principal | ICD-10-CM

## 2017-03-06 ENCOUNTER — Ambulatory Visit
Admission: RE | Admit: 2017-03-06 | Discharge: 2017-03-06 | Disposition: A | Discharge status: Home / Self Care | Payer: MEDICARE

## 2017-03-06 ENCOUNTER — Ambulatory Visit
Admission: RE | Admit: 2017-03-06 | Discharge: 2017-03-06 | Disposition: A | Discharge status: Home / Self Care | Payer: MEDICARE | Attending: Family | Admitting: Family

## 2017-03-06 DIAGNOSIS — C779 Secondary and unspecified malignant neoplasm of lymph node, unspecified: Principal | ICD-10-CM

## 2017-03-06 DIAGNOSIS — C649 Malignant neoplasm of unspecified kidney, except renal pelvis: Secondary | ICD-10-CM

## 2017-03-06 MED ORDER — OMEPRAZOLE MAGNESIUM 20 MG TABLET,DELAYED RELEASE
ORAL_TABLET | Freq: Every day | ORAL | 3 refills | 0.00000 days | Status: CP
Start: 2017-03-06 — End: 2017-07-29

## 2017-03-20 ENCOUNTER — Ambulatory Visit
Admission: RE | Admit: 2017-03-20 | Discharge: 2017-03-20 | Disposition: A | Discharge status: Home / Self Care | Payer: MEDICARE

## 2017-03-20 ENCOUNTER — Ambulatory Visit
Admission: RE | Admit: 2017-03-20 | Discharge: 2017-03-20 | Disposition: A | Discharge status: Home / Self Care | Payer: MEDICARE | Attending: Family | Admitting: Family

## 2017-03-20 DIAGNOSIS — C779 Secondary and unspecified malignant neoplasm of lymph node, unspecified: Principal | ICD-10-CM

## 2017-03-20 DIAGNOSIS — C649 Malignant neoplasm of unspecified kidney, except renal pelvis: Secondary | ICD-10-CM

## 2017-03-20 MED ORDER — VITAMIN B COMPLEX-VITAMIN C-FOLIC ACID 0.8 MG TABLET
ORAL_TABLET | Freq: Every morning | ORAL | 3 refills | 0 days | Status: CP
Start: 2017-03-20 — End: 2017-08-01

## 2017-03-25 MED ORDER — AMLODIPINE 5 MG TABLET
ORAL_TABLET | 3 refills | 0 days | Status: CP
Start: 2017-03-25 — End: 2017-07-17

## 2017-04-02 ENCOUNTER — Ambulatory Visit: Admission: RE | Admit: 2017-04-02 | Discharge: 2017-04-02 | Disposition: A | Discharge status: Home / Self Care

## 2017-04-02 DIAGNOSIS — C649 Malignant neoplasm of unspecified kidney, except renal pelvis: Principal | ICD-10-CM

## 2017-04-03 ENCOUNTER — Ambulatory Visit
Admission: RE | Admit: 2017-04-03 | Discharge: 2017-04-03 | Disposition: A | Discharge status: Home / Self Care | Payer: MEDICARE | Attending: Family | Admitting: Family

## 2017-04-03 ENCOUNTER — Ambulatory Visit
Admission: RE | Admit: 2017-04-03 | Discharge: 2017-04-03 | Disposition: A | Discharge status: Home / Self Care | Payer: MEDICARE

## 2017-04-03 DIAGNOSIS — C779 Secondary and unspecified malignant neoplasm of lymph node, unspecified: Principal | ICD-10-CM

## 2017-04-03 DIAGNOSIS — C649 Malignant neoplasm of unspecified kidney, except renal pelvis: Secondary | ICD-10-CM

## 2017-04-24 ENCOUNTER — Ambulatory Visit
Admission: RE | Admit: 2017-04-24 | Discharge: 2017-04-24 | Disposition: A | Discharge status: Home / Self Care | Payer: MEDICARE

## 2017-04-24 ENCOUNTER — Ambulatory Visit
Admission: RE | Admit: 2017-04-24 | Discharge: 2017-04-24 | Disposition: A | Discharge status: Home / Self Care | Payer: MEDICARE | Attending: Family | Admitting: Family

## 2017-04-24 ENCOUNTER — Ambulatory Visit: Admission: RE | Admit: 2017-04-24 | Discharge: 2017-04-24 | Disposition: A | Discharge status: Home / Self Care

## 2017-04-24 DIAGNOSIS — C649 Malignant neoplasm of unspecified kidney, except renal pelvis: Secondary | ICD-10-CM

## 2017-04-24 DIAGNOSIS — C779 Secondary and unspecified malignant neoplasm of lymph node, unspecified: Principal | ICD-10-CM

## 2017-05-06 ENCOUNTER — Ambulatory Visit
Admission: RE | Admit: 2017-05-06 | Discharge: 2017-05-06 | Disposition: A | Discharge status: Home / Self Care | Payer: MEDICARE

## 2017-05-16 ENCOUNTER — Ambulatory Visit
Admission: RE | Admit: 2017-05-16 | Discharge: 2017-05-16 | Disposition: A | Discharge status: Home / Self Care | Payer: MEDICARE

## 2017-05-16 ENCOUNTER — Ambulatory Visit
Admission: RE | Admit: 2017-05-16 | Discharge: 2017-05-16 | Disposition: A | Discharge status: Home / Self Care | Payer: MEDICARE | Attending: Family | Admitting: Family

## 2017-05-16 DIAGNOSIS — C779 Secondary and unspecified malignant neoplasm of lymph node, unspecified: Principal | ICD-10-CM

## 2017-05-16 DIAGNOSIS — C649 Malignant neoplasm of unspecified kidney, except renal pelvis: Secondary | ICD-10-CM

## 2017-05-29 ENCOUNTER — Ambulatory Visit
Admission: RE | Admit: 2017-05-29 | Discharge: 2017-05-29 | Disposition: A | Discharge status: Home / Self Care | Payer: MEDICARE

## 2017-05-29 ENCOUNTER — Ambulatory Visit
Admission: RE | Admit: 2017-05-29 | Discharge: 2017-05-29 | Disposition: A | Discharge status: Home / Self Care | Payer: MEDICARE | Attending: Family | Admitting: Family

## 2017-05-29 DIAGNOSIS — Z79899 Other long term (current) drug therapy: Principal | ICD-10-CM

## 2017-05-29 DIAGNOSIS — C779 Secondary and unspecified malignant neoplasm of lymph node, unspecified: Principal | ICD-10-CM

## 2017-05-29 DIAGNOSIS — C649 Malignant neoplasm of unspecified kidney, except renal pelvis: Secondary | ICD-10-CM

## 2017-06-24 ENCOUNTER — Ambulatory Visit: Admission: RE | Admit: 2017-06-24 | Discharge: 2017-06-24 | Disposition: A | Discharge status: Home / Self Care

## 2017-06-24 DIAGNOSIS — C649 Malignant neoplasm of unspecified kidney, except renal pelvis: Principal | ICD-10-CM

## 2017-06-25 MED ORDER — CABOZANTINIB 40 MG TABLET: 40 mg | tablet | Freq: Every day | 0 refills | 0 days | Status: AC

## 2017-06-25 MED ORDER — CABOZANTINIB 40 MG TABLET
ORAL_TABLET | Freq: Every day | ORAL | 0 refills | 0.00000 days | Status: CP
Start: 2017-06-25 — End: 2017-07-31

## 2017-06-26 ENCOUNTER — Ambulatory Visit
Admission: RE | Admit: 2017-06-26 | Discharge: 2017-06-26 | Disposition: A | Discharge status: Home / Self Care | Payer: MEDICARE | Attending: Family | Admitting: Family

## 2017-06-26 ENCOUNTER — Ambulatory Visit
Admission: RE | Admit: 2017-06-26 | Discharge: 2017-06-26 | Disposition: A | Discharge status: Home / Self Care | Payer: MEDICARE

## 2017-06-26 DIAGNOSIS — C649 Malignant neoplasm of unspecified kidney, except renal pelvis: Secondary | ICD-10-CM

## 2017-06-26 DIAGNOSIS — C779 Secondary and unspecified malignant neoplasm of lymph node, unspecified: Secondary | ICD-10-CM

## 2017-06-26 DIAGNOSIS — Z79899 Other long term (current) drug therapy: Principal | ICD-10-CM

## 2017-06-26 LAB — CBC W/ AUTO DIFF
EOSINOPHILS ABSOLUTE COUNT: 0.4 10*9/L (ref 0.0–0.4)
HEMATOCRIT: 37.5 % — ABNORMAL LOW (ref 41.0–53.0)
HEMOGLOBIN: 12.6 g/dL — ABNORMAL LOW (ref 13.5–17.5)
LARGE UNSTAINED CELLS: 4 % (ref 0–4)
LYMPHOCYTES ABSOLUTE COUNT: 1.3 10*9/L — ABNORMAL LOW (ref 1.5–5.0)
MEAN CORPUSCULAR HEMOGLOBIN CONC: 33.5 g/dL (ref 31.0–37.0)
MEAN CORPUSCULAR HEMOGLOBIN: 30.5 pg (ref 26.0–34.0)
MEAN CORPUSCULAR VOLUME: 91.1 fL (ref 80.0–100.0)
MEAN PLATELET VOLUME: 7.3 fL (ref 7.0–10.0)
MONOCYTES ABSOLUTE COUNT: 0.5 10*9/L (ref 0.2–0.8)
NEUTROPHILS ABSOLUTE COUNT: 2.7 10*9/L (ref 2.0–7.5)
RED BLOOD CELL COUNT: 4.12 10*12/L — ABNORMAL LOW (ref 4.50–5.90)
RED CELL DISTRIBUTION WIDTH: 14.8 % (ref 12.0–15.0)

## 2017-06-26 LAB — COMPREHENSIVE METABOLIC PANEL
ALBUMIN: 4.3 g/dL (ref 3.5–5.0)
ALKALINE PHOSPHATASE: 106 U/L (ref 38–126)
ALT (SGPT): 35 U/L (ref 19–72)
AST (SGOT): 47 U/L (ref 19–55)
BILIRUBIN TOTAL: 0.6 mg/dL (ref 0.0–1.2)
BLOOD UREA NITROGEN: 21 mg/dL (ref 7–21)
BUN / CREAT RATIO: 13
CALCIUM: 9.7 mg/dL (ref 8.5–10.2)
CHLORIDE: 109 mmol/L — ABNORMAL HIGH (ref 98–107)
CO2: 20 mmol/L — ABNORMAL LOW (ref 22.0–30.0)
EGFR MDRD AF AMER: 53 mL/min/{1.73_m2} — ABNORMAL LOW (ref >=60)
GLUCOSE RANDOM: 96 mg/dL (ref 65–179)
POTASSIUM: 4.8 mmol/L (ref 3.5–5.0)
PROTEIN TOTAL: 7.6 g/dL (ref 6.5–8.3)
SODIUM: 140 mmol/L (ref 135–145)

## 2017-06-26 LAB — EGFR MDRD NON AF AMER
Glomerular filtration rate/1.73 sq M.predicted.non black:ArVRat:Pt:Ser/Plas/Bld:Qn:Creatinine-based formula (MDRD): 44 — ABNORMAL LOW

## 2017-06-26 LAB — WBC ADJUSTED: Lab: 5.2

## 2017-06-26 NOTE — Unmapped (Addendum)
You were seen in the [facility] today. We will plan to:    1. At this time we will stop treatment with Nivolumab via OMNIVORE clinical trial in favor of changing treatment to Cabozantinib based on CT imaging that reported progression of disease.   2. Cabozantinib 40mg  has been ordered. You will start taking once delivered to you home. Please contact us and inform us to the date you begin   3. Call if you experience any issues            [name], FNP-BC  [facility]  [email or web address]          Contact information:    In the event of an emergency call 911    Cancer Hospital:  Phone: [phone]  Fax: [phone]    After hours/nights/weekends:  Hospital Operator: [phone]           cabozantinib  Pronunciation:  KA boe ZAN ti nib  Brand:  Cabometyx, Cometriq  What is the most important information I should know about cabozantinib?  Cabozantinib may cause a perforation (a hole or tear) or a fistula (an abnormal passageway) within your stomach or intestines. Cabozantinib can also increase your risk of serious bleeding.  Call your doctor if you have:  severe stomach pain, choking or gagging when you eat or drink, unusual bleeding, bloody or tarry stools, heavy menstrual bleeding, or if you cough up blood.  What is cabozantinib?  Cabozantinib interferes with the growth of some cancer cells.  Cabozantinib is used to treat advanced kidney cancer, or thyroid cancer that has spread to other parts of the body.  Cabozantinib may also be used for purposes not listed in this medication guide.  What should I discuss with my healthcare provider before taking cabozantinib?  You should not use cabozantinib if you are allergic to it.  Tell your doctor if you have:  ?? an open wound or sore on your skin;  ?? high blood pressure;  ?? bleeding problems;  ?? liver disease; o  ?? a pre-existing dental problem.  Cabozantinib may harm an unborn baby. Use effective birth control to prevent pregnancy while you are using this medicine and for at least 4 months after your last dose.  You should not breast-feed while using this medicine, and for at least 4 months after your last dose.  How should I take cabozantinib?  Follow all directions on your prescription label and read all medication guides or instruction sheets. Use the medicine exactly as directed.  Do not use cabozantinib tablets in place of cabozantinib capsules. Take only the pill form your doctor has prescribed. Avoid medication errors by always checking the medicine you receive at the pharmacy.  Take cabozantinib on an empty stomach, at least 1 hour before or 2 hours after you eat anything.  Take this medicine with a full glass of water.  Do not crush, chew, or break a tablet, and do not open a capsule. Swallow the pill whole.  Your blood pressure will need to be checked often.  If you need surgery or dental work, stop taking cabozantinib at least 28 days ahead of time.  If you have stopped taking cabozantinib for any reason,  talk with your doctor before you start taking it again.  Store at room temperature away from moisture and heat.  What happens if I miss a dose?  Use the medicine as soon as you can, but skip the missed dose if your next  dose is due in less than 12 hours. Do not use two doses at one time.  What happens if I overdose?  Seek emergency medical attention or call the Poison Help line at [phone].  What should I avoid while taking cabozantinib?  Grapefruit may interact with cabozantinib and lead to unwanted side effects. Avoid the use of grapefruit products while taking cabozantinib.  This medicine can pass into body fluids (urine, feces, vomit). Caregivers should wear rubber gloves while cleaning up a patient's body fluids, handling contaminated trash or laundry or changing diapers. Wash hands before and after removing gloves. Wash soiled clothing and linens separately from other laundry.  What are the possible side effects of cabozantinib?  Get emergency medical help if you have signs of an allergic reaction: hives; difficult breathing; swelling of your face, lips, tongue, or throat.  Cabozantinib may cause a perforation (a hole or tear) or a fistula (an abnormal passageway) within your stomach or intestines. Call your doctor if you have severe stomach pain, or if you feel like you are choking and gagging when you eat or drink.  Call your doctor at once if you have:  ?? severe or ongoing vomiting or diarrhea;  ?? bloody or tarry stools, coughing up blood or vomit that looks like coffee grounds;  ?? heavy menstrual bleeding, or any other bleeding that will not stop;  ?? pain, tenderness, redness, swelling, blistering, or peeling skin on the palms of your hands or the soles of your feet;  ?? a light-headed feeling, like you might pass out;  ?? confusion, change in mental status, seizure (convulsions);  ?? increased blood pressure --severe headache, blurred vision, pounding in your neck or ears;  ?? heart attack symptoms --chest pain or pressure, pain spreading to your jaw or shoulder, sweating;  ?? signs of a stroke --sudden numbness or weakness (especially on one side of the body), sudden severe headache, slurred speech, problems with vision or balance; or  ?? signs of a blood clot --chest pain, cough, trouble breathing, swelling or warmth in your arms or legs.  Your future doses of cabozantinib may be delayed or permanently discontinued if you have certain side effects.  Common side effects may include:  ?? nausea, vomiting, stomach pain, loss of appetite;  ?? diarrhea, constipation;  ?? mouth sores or redness, changes in your sense of taste;  ?? feeling tired;  ?? weight loss; or  ?? abnormal liver function tests or other blood tests.  This is not a complete list of side effects and others may occur. Call your doctor for medical advice about side effects. You may report side effects to FDA at 1-800-FDA-1088.  What other drugs will affect cabozantinib? Sometimes it is not safe to use certain medications at the same time. Some drugs can affect your blood levels of other drugs you take, which may increase side effects or make the medications less effective.  Other drugs may interact with cabozantinib, including prescription and over-the-counter medicines, vitamins, and herbal products. Tell your doctor about all your current medicines and any medicine you start or stop using.  Where can I get more information?  Your pharmacist can provide more information about cabozantinib.    Remember, keep this and all other medicines out of the reach of children, never share your medicines with others, and use this medication only for the indication prescribed.  Every effort has been made to ensure that the information provided by [employer] ('[employer]') is accurate, up-to-date, and complete,  but no guarantee is made to that effect. Drug information contained herein may be time sensitive. [employer] information has been compiled for use by healthcare practitioners and consumers in the [place] and therefore [employer] does not warrant that uses outside of the [place] are appropriate, unless specifically indicated otherwise. Mult[employer]'s drug information does not endorse drugs, diagnose patients or recommend therapy. [employer]'s drug information is an Investment [employer], corporate to assist licensed healthcare practitioners in caring for their patients and/or to serve consumers viewing this service as a supplement to, and not a substitute for, the expertise, skill, knowledge and judgment of healthcare practitioners. The absence of a warning for a given drug or drug combination in no way should be construed to indicate that the drug or drug combination is safe, effective or appropriate for any given patient. Mult[employer] does not assume any responsibility for any aspect of healthcare administered with the aid of information Mult[employer] provides. The information contained herein is not intended to cover all possible uses, directions, precautions, warnings, drug interactions, allergic reactions, or adverse effects. If you have questions about the drugs you are taking, check with your doctor, nurse or pharmacist.  Copyright [phone] [employer] Version: 2.01. Revision date: 08/17/2016.  Care instructions adapted under license by [facility]. If you have questions about a medical condition or this instruction, always ask your healthcare professional. [employer] disclaims any warranty or liability for your use of this information.  Make sure udderly smooth cream with 20% Urea (Extra Care 20) or any cream with Urea 20% apply to hands and feet twice daily

## 2017-06-26 NOTE — Unmapped (Signed)
Lab drawn and sent for analysis.

## 2017-06-26 NOTE — Unmapped (Signed)
Clinical Pharmacist Practitioner: [facility]    New Start Cabozantinib Education    Sergio Horton is a 66 y.o. male with mRCC who I am counseling today on initiation of oral chemotherapy.    Oral chemotherapy regimen: Cabozantinib 40 mg daily, starting at a dose reduction due to history of cirrhosis    Sergio Horton was previously on sunitinib and experienced hand foot syndrome. He did not experience any other side effects but this was very bothersome for him.     Side effects discussed included but were not limited to: fatigue, diarrhea, HTN, hand foot syndrome, myelosuppression, hepatotoxicity, N/V, lyte abnormalities, bleeding, cardiac effects, thrombotic events as well as others. Side effect prevention and management were also reviewed including CBC and LFT monitoring periodically during treatment.    Administration: I reviewed the importance of taking without food i.e. 1h before of 2h after a meal, oral chemotherapy handling precautions, as well as the importance of medication adherence.    Drug Interactions: Instructed the patient that this medication has potential drug interactions. The patient should inform me of any new prescriptions so that I may evaluate for potential drug interactions.other medications reviewed and up to date in Epic.  No drug interactions identified.    Storage requirements: this medicine should be stored at room temperature.     Handling precautions reviewed    Comorbidities/Allergies: reviewed and up to date in Epic.    Handout provided: Chemotherapy handout from [facility]    Patient verbalized understanding of the above information as well as how to contact the Team with any questions/concerns.    Assessment/ Plan:  1. Cabozantinib monitoring and side effect management- will start Urea 20% cream once starting on the medication. He will also monitor blood pressure at home and report if BP's are above 150/90. Currently takes lisinopril 10 mg and amlodipine 5 mg with good blood pressure control with home measurements in the 130/80s.    2. Cabozantinib access- will require manufacturer assistance. [name] will see Sergio Horton today for MAP services.     3. Medication Management- reviewed and updated med list.     F/u: Will see at 1 month f/u visit.     Time with patient: 10 min    Laverna Peace PharmD, BCOP, CPP  Hematology/Oncology Pharmacist  P: [phone]

## 2017-06-26 NOTE — Unmapped (Signed)
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 Will plan to start cabozantinb at this time given POD noted on recent CT scan.     1. ccRCC: On treatment via Omnivore clinical trial, Arm B (ipi + nivo x 2 cycles with return to single agent Nivo). He has done well on treatment. No additional reportable SE's. At this time we will end treatment on trial 2/2 POD. We reviewed his recent imaging performed on 06/24/2017 that reported interval growth by RECIST measurement in the soft tissue mass in the nephrectomy bed, as well as an iliac LN and abdominal LN. After reviewing the results, we had a lengthy discussion regarding treatment recommendations.    I explained that Cabozantinib is indicated in patients with advanced clear cell RCC after prior treatment with a TKI. I reviewed the significant improvement in PFS compared to everolimus (7.4 mo vs 3.8 mo, HR=0.58 (95% CI: 0.45-0.74); p<0.0001), improvement in overall survival (21.4 mo vs 16.5 mo, HR=0.66 (95% CI: 0.53-0.83), p=0.0003) and objective response rate (17% vs 3%, p<0.0001). I discussed potential side effects including but not limited to diarrhea, fatigue, nausea, decreased appetite, PPES, HTN, vomiting, weight loss, and constipation. I discussed the potential need for dose reduction (60%) and dose withholds (70%) and the discontinuation rate of 10%.     I also explained that we would start dosing at 40mg  daily given liver disease and monitor LFT's closely.     I explained the need to take without food - no food 2 hours prior and 1 hour after. I also reviewed other potential therapies including everolimus, axitinib and lenvatinib and everolimus.         2. Cirrhosis: Has intermittent baseline leukopenia and thrombocytopia, although reports no episodes of decompensated cirrhosis and currently in normal range. Stable and well compensated.    3. HTN: better controled. Currently on amlodipine only. Discontinued lisinopril previously due to cough     4. Asymmetric LE edema: No DVT per recent doppler    5. Elevated TSH: PCP increased levothyroxine recently for subclinical hypothyroidism. Well controlled with normal free T4 and will monitor.    6. Ventral hernia: easily reduces. Advised wearing abdominal binder if is becomes bothersome. He is meeting with  Dr. Carlynn Purl on 07/11/2017. I will send my note to her today.      7. Elevated sCr: To note sCr ranges from 1.2- 1.6. 1.5 today      8. GERD: prilosec          Plan:  -- Start Cabozantinib 40mg  daily   --Met with pharmacist Katina Degree for drug counseling    --RTC 2 weeks after starting [place]   --Referral placed to Dr. Carlynn Purl regarding ventral hernia  --He understands to call with any issues          History of Present Illness:    Sergio Horton is a 66 y.o. male with h/o right nephrectomy and caval thrombectomy for ccRCC who is seen in follow-up for locally recurrent RCC, on treatment via Omnivore     He originally presented in 2015 with a large right renal mass. He underwent nephrectomy in 06/2014 and  pathology showed a 11.5cm grade 2 ccRCC with invasion into the perirenal adipose tissue (pT3a). He also had a retrocaval mass resected that also was ccRCC. Post-op course was complicated by respiratory failure requiring ECMO. He was in the hospital for a month after his surgery. Routine imaging demonstrated likely local recurrence and biopsy confirmed RCC. He initiated sunitinib on 07/27/16 and dose reduced 08/22/16 due to neutropenia, restarted 09/03/16, the dose reduced again for HFS. CT on 12/03/16 showed POD and sunitinib was stopped. He transitioned to treatment via OMNIVORE trial (01/07/2017-06/24/2017), that utilized single agent nivolumab, until POD was noted, then he received 2 cycles of Ipi + Nivo. Repeat imaging reported POD.     Given POD he will start Cabozantinib.     Interval History:  The patient is here in follow up to review his most recent images. He continues to work and remain active, his appetite is good, weight is stable. His reflux has improved with the prilosec. No diarrhea, CP, SOB, HA, vision changes      Past Medical History:  Past Medical History:   Diagnosis Date   ??? Cirrhosis of liver (CMS-HCC)    ??? Hypertension    ??? Hypothyroidism    ??? Renal cancer (CMS-HCC)    ??? Thrombocytopathia (CMS-HCC)    Alcoholic Cirrhosis - denies any ascites or h/o encephalopathy  HTN   No history of autoimmune disorder.    Medications:    Current Outpatient Prescriptions on File Prior to Visit   Medication Sig Dispense Refill   ??? amLODIPine (NORVASC) 5 MG tablet take 1 tablet by mouth once daily 30 tablet 3   ??? B complex-vitamin C-folic acid (RENA-VITE) 0.8 mg Tab Take 1 tablet by mouth every morning. 30 tablet 3   ??? levothyroxine (SYNTHROID, LEVOTHROID) 100 MCG tablet Take 100 mcg by mouth daily at 0600.     ??? lisinopril (PRINIVIL,ZESTRIL) 10 MG tablet Take 1 tablet (10 mg total) by mouth daily. 90 tablet 3   ??? omeprazole (PRILOSEC OTC) 20 MG tablet Take 1 tablet (20 mg total) by mouth daily. 90 tablet 3   ??? cabozantinib 40 mg Tab Take 40 mg by mouth daily at 0600. (Patient not taking: Reported on 06/26/2017) 30 tablet 0   ??? omeprazole (PRILOSEC) 20 MG capsule Take 20 mg by mouth daily.  0     No current facility-administered medications on file prior to visit.      Allergies:  No Known Allergies    Social History:  Lives in [place] with wife. He has a h/o cirrhosis from alcohol use but does not currently use alcohol. No tobacco use. No other drugs. Works as a Financial risk analyst.    Review of Systems: A 10 system review was completed and was negative except per HPI.    Physical Exam:  Vitals:    06/26/17 1009   Pulse: 60   Temp: 36.4 ??C (97.6 ??F)   SpO2: 97%     ECOG PS 1    General:   No acute distress, alert and interactive   Eyes:    Extra ocular muscles intact.  Sclera not icteric.   ENT:  Oropharynx clear.   Neck:  Supple   Lymph Nodes:  No cervical adenopathy   Cardiovascular:  RRR without murmurs, rubs, gallops   Lungs:  Clear to auscultation bilaterally, without wheezes/crackles/rhonchi.   Skin:    Healing hand blisters, no other rash.    Abdomen:   Abdomen soft, not tender and not distended, +  incisional hernia, well-healed incision   Extremities:   Warm and well-perfused with L>R edema.   Neurological:  Alert and oriented to person, place and time.     Labs:  Reviewed in Epic    Imaging:    05/25/16 CT CAP:  -- Status post right nephrectomy.  -- Irregular soft tissue density foci with peripheral enhancement in the right nephrectomy bed and extending inferiorly along the psoas muscle, concerning for recurrent malignancy.  -- Aortocaval lymphadenopathy.    07/04/16 Bone scan  -No evidence of metastatic osseous disease.    07/20/16 Echo:  ?? Normal left ventricular systolic function, ejection fraction > 55%  ?? Diastolic dysfunction - grade I (normal filling pressures)  ?? Normal right ventricular systolic function      09/24/16 CT CAP:  -- Status post right nephrectomy. There has been interval response to therapy, with decreasing size of recurrent tumor within the nephrectomy bed, and decreasing aortocaval nodal tissue.  -- Unchanged ultrasound morphology of liver compatible with history of cirrhosis.    12/03/16 CT CAP:  -Worsening metastatic disease in the right nephrectomy bed with enlarging aortocaval nodes/nodules and new perihepatic implant.    For reference pericaval nodes/nodules now measure up to 2 cm, previously 1.5 cm (2:93). There is new enhancing lesion in/along the right psoas musculature measuring up to 1.8 cm (2:99-106). 03/05/2017 CT CAP s/p 4C Nivo on Omnivore:  - Increase appearance of metastatic implants along the inferior right hepatic lobe. Increased size of nodularity in the right nephrectomy and increased pericaval node/nodule compatible with worsening metastatic disease.  --Increased size of pericaval nodes/nodules measure up to 2.4 cm (2:93), previously 2.0 cm  --Slightly increased size of nodularity in the right nephrectomy bed measuring 3.6 by 3.4 x 2.0 cm (2:90)  -Small-volume ascites.      06/24/2017 CT CAP  -Progressive disease with increasing nephrectomy bed soft tissue, adjacent retroperitoneal adenopathy, right psoas/paraspinal soft tissue implants, and posterior perihepatic implant. No evidence of metastatic disease in the chest.  -Small amount of nonspecific fluid/small collection adjacent to the ventral abdominal hernia sac on the right measuring 1.6 cm. ??This appears increased compared to 04/02/17 but similar to 03/05/2017.  ????-Mild circumferential bladder wall thickening which may be due to contraction versus cystitis. Recommend correlation with urinalysis.  -Cirrhotic liver morphology.      Pathology:    06/29/14:  Diagnosis:  A: ??Liver, segment 5, core needle biopsy  - Cirrhosis, etiology uncertain (see Light Microscopy) ??    B: ??Kidney and adrenal, right, radical nephrectomy, adrenalectomy, and caval  thrombectomy   Tumor histologic type/subtype: ??clear cell carcinoma  Sarcomatoid features:?????????? not identified   Histologic grade: Grade 2 (of 4) Fuhrman classification??????????  Tumor size: 11.5 cm diameter   Tumor focality: unifocal     Extent of invasion:??????????  ???? ?? Extra-capsular invasion into perirenal adipose tissue: not identified  ???? ?? Gerota's fascia: not involved   ???? ?? Renal sinus: not involved ??????????  ???? ?? Major veins (renal vein or segmental branches, IVC): involved (macro and  micro)  ???? ?? Ureter: not involved   ???? ?? Venous: ??involved   ???? ?? Lymphatic:??????????not involved   ???? ??   Histologic assessment of surgical margins:??????????  ???? ?? Peri-nephric adipose tissue margin (partial nephrectomy only): negative   ???? ?? Gerota's fascia: negative   ???? ?? Renal vein margin:??????????no adherent carcinoma identified on initial or  recuts of en face margin (B2) ??  ???? ?? Ureter margin: ??negative  Adrenal gland:?????????? negative   Lymph nodes:??????????none received     Pathologic findings in non-neoplastic kidney: focal global glomerulosclerosis   AJCC Stage (renal primary): ??pT3b (intrahepatic caval involvement) ?? pNx ?? pMx    C: ??Mass, retrocaval, excision   - Renal clear cell carcinoma, partially encapsulated, presumed confined by  Gerota's fascia, extending to uninked tissue edges    06/27/16:   Right nephrectomy bed, core biopsy and touch preparation:  - Diagnostic of malignancy.  - Morphology consistent with recurrence of patient's known clear cell renal cell carcinoma (Grade 2 of 4 Fuhrman Nuclear Grade).

## 2017-06-26 NOTE — Unmapped (Signed)
Per test claim for Cabometyx at the [facility], patient needs Medication Assistance Program for High Copay.

## 2017-06-27 NOTE — Unmapped (Signed)
Medication Assistance Program Update:    Met with patient in clinic today.   ??  Introduced myself and spoke with patient about MAP services.    Had patient sign a signature waiver to keep on file.     Also provided patient with welcome packet, which includes my contact information, a list of necessary documents to start gathering now should we need manufacturer assistance, as well as an envelope addressed to me should he need it to mail in documents.    Signature waiver can be found in patient's media tab.    Time: 10 min  ??  [name]  Pharmacy Transition Specialist  Medication Assistance Program

## 2017-06-28 NOTE — Unmapped (Signed)
Per test claim for Cabometyx at the [facility], approved for $ 0 with grant until 07/11/17. PA for medication approved for $939.27, new assistance will need to be obtained after 07/11/17.

## 2017-07-01 NOTE — Unmapped (Signed)
Protocol/Clinical Study:DFCI17-064/OMNIVORE: Phase II study of Optimized Management of NIVOlumab based on REsponse in patients with advanced renal cell carcinoma  Study Treatment: Arm B   Cycle / Day: End of Treatment  Date: 06/26/2017  Performance Status:ECOG=0  ??  Adverse Event Log  AE ?? Date/Time Started ?? Date/Time Ended ?? Grade ?? Causality to StudyTx (Unr/Unlikely/  Poss/Prob/Def) ?? Action Taken ?? Clinically significant? (yes/no)   ANC decreased 06 Feb 2017 ??03/20/2017 1 ??probable none no   ??AST increased 06 Feb 2017 ??20 Feb 2017 1 ??probable none no   Gastroesophageal reflux disease 06 Mar 2017 ongoing 1 ??unlikely none no   Creatinine increased 15Aug2018 05/29/2017 1 possible Dose held yes   ANC decreased 04/24/2017 05/16/2017 1 possible CTM no   ??  Clinical significance of labs: Unless otherwise noted, all labs are to be considered not clinically significant.  ??  Narrative: Patient presents in clinic today to discuss CT results with provider and complete EOT procedures. Patient CT results confirmed disease progression. Pt stated he feels okay; denies any AEs or changes to medications since last clinic visit.   ??   Assessment: Physical exam completed by [name], NP; labs reviewed. MH/AEs and ConMeds reviewed and updated.   ??  Plan: Patient will complete EOT study procedures for DFCI17-064/Omnivore. Pt will initiate Cabozantinib 40 mg regimen per provider report. Coordinator plan to contact pt in 6 months for follow-up. Patient advised to call if any??questions/concerns arise in the interim.   ??  Concomitant Medication Log  Medication ?? Dose ?? Frequency ?? Route ?? Start Date ?? Stop Date ?? Indication (related AE or MH) ??   ??amLODIPine (Norvasc) 10mg  QD PO 08/13/2016 ?? HTN   amLODIPine (Norvasc) 5mg  QD PO 08/13/2016 ?? HTN   RENA-VITE 0.8mg  QD PO Jan2016 ?? MH   ??levothyroxine QD PO ??04/29/2014 ?? MH/Thyroid   lisinopril 10mg  QD PO 01/21/2017 ?? HTN   priolosec 20mg  QD PO 03/06/2017 ?? GERD   ??  Medical History Medical History ?? Date Started ?? Date Ended ?? Grade Medication ??   ??cirrhosis ??[id number] ??ongoing n/a baseline   ??hypertension ??[id number] ongoing 1 ??amlodipine   Asymmetric LE edema 08/27/2016 ongoing 1 ??   thyroid disorder 04/29/2014 ongoing 1 synthyroid/levothyroxine    anemia [id number] ongoing 1 ??   ??  Bernardo Heater [phone]

## 2017-07-02 NOTE — Unmapped (Signed)
Note from July says pt discontinued d/t cough, but was also prescribed that same day. Please send if appropriate

## 2017-07-03 MED FILL — CABOMETYX/40MG/TABS: CABOMETYX/40MG/TABS | 30 days supply | Qty: 30 | Fill #0

## 2017-07-03 NOTE — Unmapped (Signed)
[name] with [facility] was calling because they are receiving a denial for the Lisinopril. She can be reached at [phone].  Thanks in advance,  [name]  [facility]  [phone]

## 2017-07-03 NOTE — Unmapped (Signed)
[facility] Onboarding (see previous note for clinical onboarding)    Specialty Medication(s): Cabometyx 40 mg daily  Diagnosis: mRCC  Refills: 30 day    Sergio Horton, DOB: 30-Aug-1950  Phone: [phone] (home) [phone] (work)  Confirmed Shipping address: [address]  [name]  [address]  All above HIPAA information was verified with patient.    Next Scheduled Delivery Date: 07/04/17 from [facility]     Financial/Shipment   Primary Billing: Medicare  Secondary Billing [name], Medicaid, CoPay Card, Secondary Commercial): [name]  Anticipated copay of $0 reviewed with patient (See full details under Referrals tab in EPIC).    Verified delivery address in FSI and reviewed medication storage requirement.    The following was explained to the patient:  Advised patient of the following:  -A Welcome packet will be sent to the patient   -Assignment of Benefit for the patient to review and return before next refill  -Arrangement of payment method can be done by contacting the pharmacy  -Take medications with during travel, have doctor's appointments, or if being admitted to the hospital.    Advised patient of refill order process:  Specialty pharmacy process for medication shipment was also reviewed.  Discussed the service provided by [facility] with respects to monthly outbound calls to the patient to set up refill deliveries 7-10 days prior to their subsequent needed refill.  Emphasized need for patient to be reachable in order to schedule medication shipment and that shipment will be shipped to the deliverable address provided via UPS.  Informed patient that welcome packet will be sent.        Provided the [facility] pharmacy contact information below:  [facility] [phone], option 4)    Medication Education   Patient was counseled on this medication previously please see prior note.     Patient verbalized understanding of the above information. Answered all patient questions.  [facility] team will follow up with patient monthly for standard refill processing and delivery.      Laverna Peace PharmD, BCOP, CPP  Hematology/Oncology Pharmacist  P: [phone]

## 2017-07-04 MED ORDER — LISINOPRIL 10 MG TABLET: 10 mg | tablet | 3 refills | 0 days

## 2017-07-04 MED ORDER — LISINOPRIL 10 MG TABLET
ORAL_TABLET | Freq: Every day | ORAL | 3 refills | 0.00000 days | Status: CP
Start: 2017-07-04 — End: 2019-01-08

## 2017-07-05 NOTE — Unmapped (Signed)
Spoke to pt. He says he has been taking the lisinopril since his 12/5 appt with [facility], but he also confirms that it is still coughing. He will stop taking it now unless he hears otherwise or any further instructions from [place].

## 2017-07-05 NOTE — Unmapped (Signed)
See refill encounter dated 12/11.

## 2017-07-05 NOTE — Unmapped (Signed)
Patient called today and left voicemail at 10:28 stating that he was returning a call from [employer].

## 2017-07-09 NOTE — Unmapped (Signed)
Called patient to clarify what BP medications he was taking. He said currently he is taking amlodipine 5 mg daily. He had been taking lisiniopril 10 mg daily for several years and has had a dry cough the entire time. He has stopped since he was instructed to on 12/14.     He just received the cabozantinib from the company the other day and today is day 2 of treatment.     He is retuning to clinic on 07/17/17 for follow up with [name], NP.     Laverna Peace PharmD, BCOP, CPP  Hematology/Oncology Pharmacist  P: [phone]

## 2017-07-09 NOTE — Unmapped (Deleted)
Return Visit Exam   66 y.o. male ventral incisional hernia eval at the request of  Dr. Nicanor Horton of [facility]. 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 His post-operative course following his nephrectomy in 2015 was complicated by AKI, septic shock and three days on ECMO. Omnivore clinical trial. Please see note dated 06/26/17 by Sergio Natal FNP for most recent onc hx.         Assessment:             Plan:             Subjective:       Sergio Horton is seen in followup for the evaluation of .  He was last seen in the clinic on 09/06/16    History of Present Illness:  Sergio Horton is a 66 y.o. year old male      Objective:   Objective   Past Medical History:  Past Medical History:   Diagnosis Date   ??? Cirrhosis of liver (CMS-HCC)    ??? Hypertension    ??? Hypothyroidism    ??? Renal cancer (CMS-HCC)    ??? Thrombocytopathia (CMS-HCC)        Past Surgical History:  Past Surgical History:   Procedure Laterality Date   ??? partial orchiectomy Left     for infection   ??? PR EXPLORATORY OF ABDOMEN N/A 07/02/2014    Procedure: EXPLORATORY LAPAROTOMY, EXPLORATORY CELIOTOMY WITH OR WITHOUT BIOPSY(S);  Surgeon: Sergio Edward, MD;  Location: MAIN OR [facility];  Service: Urology   ??? PR REMV KIDNEY,RADICAL Right 06/29/2014    Procedure: NEPHRECTOMY, INCL PART URETERECT, ANY OPEN APPR W/RIB RESECT; RADICAL W/REGION LYMPHADENECT/VENA CAVA THROM;  Surgeon: Sergio Edward, MD;  Location: MAIN OR [facility];  Service: Urology       Medications:  @ACTMED @    Allergies:  No Known Allergies    Family History:  Family History   Problem Relation Age of Onset   ??? GU problems Neg Hx    ??? Kidney cancer Neg Hx    ??? Kidney disease Neg Hx    ??? Cancer Neg Hx    ??? Liver disease Neg Hx        Social History:  Social History   Substance Use Topics   ??? Smoking status: Never Smoker   ??? Smokeless tobacco: Never Used   ??? Alcohol use No      Comment: Pt quit drinking 3 months ago.  He was drinking 28 glasses of wine a week.       Review of Systems:        Physical Exam:   VITAL SIGNS: There were no vitals filed for this visit.

## 2017-07-11 ENCOUNTER — Ambulatory Visit: Admission: RE | Admit: 2017-07-11 | Discharge: 2017-07-11 | Payer: MEDICARE | Attending: Surgery | Admitting: Surgery

## 2017-07-11 DIAGNOSIS — K439 Ventral hernia without obstruction or gangrene: Principal | ICD-10-CM

## 2017-07-11 NOTE — Unmapped (Signed)
You were seen at the [facility] by Dr. Thomes Cake for evaluation of hernia.    ?? Follow up with your oncology doctor   ?? Wear abdominal binder for comfort  ?? Call us to schedule a follow-up appointment            If you have any questions, please don't hesitate to call our office:  [facility] Department of Surgery, Division of General and Acute Care Surgery  [address]  [place], [place]  [address]  Phone: [phone]

## 2017-07-11 NOTE — Unmapped (Signed)
[facility] HERNIA CENTER FOLLOW-UP APPOINTMENT  07/11/2017    Patient: Sergio Horton  DOB: [birth year]  Previously seen in clinic on: 09/06/16  Primary Care Provider:  Deirdre Peer POLITE, MD    HPI: This is a 66 y.o. M w/hx of pT3a RCC s/p R nephrectomy in 06/2014, who presents for a discussion of elective repair of his ventral hernia.     Interval events:   Pt was on OMNIVORE trial for local recurrence but found to have progression of disease on recent visit to Dr. Leticia Clas office and has since been initiated on Cabozantinib as part of an experimental trial. He has taken 4 doses of it and feels well. Is due for another assessment in the office 4 weeks after starting it to re-evaluate for conitnuation of therapy vs progression. He understands he would need to hold this medication in the immediate perioperative period and up to 6 weeks afterwards. His hernia currently mainly bothers him because of the way his abdomen looks. No pain, obstructive symptoms or associated skin changes.     BP 182/89 (BP Site: L Arm, BP Position: Sitting, BP Cuff Size: Medium)  - Pulse 60  - Temp 36.4 ??C (97.5 ??F) (Oral)  - Ht 170.2 cm (5' 7)  - Wt 73.6 kg (162 lb 4.8 oz)  - SpO2 98%  - BMI 25.42 kg/m??       Wt Readings from Last 12 Encounters:   07/11/17 73.6 kg (162 lb 4.8 oz)   06/26/17 76.2 kg (167 lb 14.4 oz)   05/29/17 75.5 kg (166 lb 7.2 oz)   05/29/17 75 kg (165 lb 5 oz)   05/16/17 76.1 kg (167 lb 12.8 oz)   04/24/17 76.2 kg (167 lb 15.9 oz)   04/24/17 75.8 kg (167 lb)   04/03/17 77.9 kg (171 lb 11.8 oz)   04/03/17 76.8 kg (169 lb 5 oz)   03/20/17 78.1 kg (172 lb 2.9 oz)   03/20/17 77.6 kg (171 lb 1.6 oz)   03/06/17 77.2 kg (170 lb 1.4 oz)     GEN: AOx3, pleasant  HEENT: Normocephalic, atraumatic, trachea midline.   NEURO: CN II-XII grossly intact.   CV: RRR, no peripheral edema, 2+DP pulses bilaterally  PULM: EWOB on RA, diminished BS @ bases  ABD: soft, nt/nd, right hemi-chevron incision well healed, easily palpable fascial defect and a reducible ventral hernia associated with the midline portion of the incision. Defect approximately 7 cm , +BS, no rebound/guarding  GU: Normal external male genitalia.     Assessment & Plan:    We discussed with Mr. Deardorff that his cancer needs to take precedence over hernia repair at this point, as fixing it would require holding his treatment in the perioperative period. Especially given the purely cosmetic nature of this process at this point, we believe it would be difficult to justify putting his oncologic outcome at risk and perform the surgery today. He understands and concurs. He will return to see [place] after his appointment with the urology clinic after he has a better sense of his progression and tolerance of the medication.     Kristen Loader, MD, MPH   PGY 5, General Surgery  [phone]  9:52 PM          ATTESTATION signed by Kristopher Oppenheim, MD, MPH at 07/12/17 1:21 PM   I saw and evaluated the patient with the resident after the initial interview.   I reviewed the resident???s note, have edited where appropriate, and agree  with the resident???s findings and plan.  -- Foster Simpson, MD, MPH    Patient with mildly symptomatic but nonobstructive ventral incisional hernia.  He is currently on an experimental trial of chemotherapy due to progression of disease.  Patient should continue to focus on treatment for his cancer, and we will hold off on any intervention for his hernia repair at this time.  Patient should continue to use an abdominal binder or compression garment for comfort.

## 2017-07-17 ENCOUNTER — Ambulatory Visit
Admission: RE | Admit: 2017-07-17 | Discharge: 2017-07-17 | Disposition: A | Discharge status: Home / Self Care | Payer: MEDICARE

## 2017-07-17 ENCOUNTER — Ambulatory Visit
Admission: RE | Admit: 2017-07-17 | Discharge: 2017-07-17 | Disposition: A | Discharge status: Home / Self Care | Payer: MEDICARE | Attending: Family | Admitting: Family

## 2017-07-17 DIAGNOSIS — E039 Hypothyroidism, unspecified: Principal | ICD-10-CM

## 2017-07-17 DIAGNOSIS — C649 Malignant neoplasm of unspecified kidney, except renal pelvis: Principal | ICD-10-CM

## 2017-07-17 DIAGNOSIS — C779 Secondary and unspecified malignant neoplasm of lymph node, unspecified: Secondary | ICD-10-CM

## 2017-07-17 LAB — THYROID STIMULATING HORMONE: Thyrotropin:ACnc:Pt:Ser/Plas:Qn:: 1.017

## 2017-07-17 LAB — COMPREHENSIVE METABOLIC PANEL
ALBUMIN: 4.5 g/dL (ref 3.5–5.0)
ALKALINE PHOSPHATASE: 127 U/L — ABNORMAL HIGH (ref 38–126)
ALT (SGPT): 58 U/L (ref 19–72)
ANION GAP: 11 mmol/L (ref 9–15)
AST (SGOT): 85 U/L — ABNORMAL HIGH (ref 19–55)
BLOOD UREA NITROGEN: 19 mg/dL (ref 7–21)
BUN / CREAT RATIO: 14
CALCIUM: 9.4 mg/dL (ref 8.5–10.2)
CHLORIDE: 103 mmol/L (ref 98–107)
CO2: 23 mmol/L (ref 22.0–30.0)
CREATININE: 1.32 mg/dL — ABNORMAL HIGH (ref 0.70–1.30)
EGFR MDRD AF AMER: >=60 mL/min/{1.73_m2} (ref >=60)
EGFR MDRD NON AF AMER: 54 mL/min/{1.73_m2} — ABNORMAL LOW (ref >=60)
GLUCOSE RANDOM: 112 mg/dL (ref 65–179)
PROTEIN TOTAL: 7.9 g/dL (ref 6.5–8.3)

## 2017-07-17 LAB — CBC W/ AUTO DIFF
BASOPHILS ABSOLUTE COUNT: 0 10*9/L (ref 0.0–0.1)
EOSINOPHILS ABSOLUTE COUNT: 0.2 10*9/L (ref 0.0–0.4)
HEMATOCRIT: 38.3 % — ABNORMAL LOW (ref 41.0–53.0)
HEMOGLOBIN: 13 g/dL — ABNORMAL LOW (ref 13.5–17.5)
LYMPHOCYTES ABSOLUTE COUNT: 0.7 10*9/L — ABNORMAL LOW (ref 1.5–5.0)
MEAN CORPUSCULAR HEMOGLOBIN CONC: 33.9 g/dL (ref 31.0–37.0)
MEAN CORPUSCULAR HEMOGLOBIN: 30.5 pg (ref 26.0–34.0)
MEAN CORPUSCULAR VOLUME: 89.9 fL (ref 80.0–100.0)
MEAN PLATELET VOLUME: 7.5 fL (ref 7.0–10.0)
MONOCYTES ABSOLUTE COUNT: 0.3 10*9/L (ref 0.2–0.8)
NEUTROPHILS ABSOLUTE COUNT: 5.4 10*9/L (ref 2.0–7.5)
RED BLOOD CELL COUNT: 4.26 10*12/L — ABNORMAL LOW (ref 4.50–5.90)
RED CELL DISTRIBUTION WIDTH: 14.9 % (ref 12.0–15.0)
WBC ADJUSTED: 6.8 10*9/L (ref 4.5–11.0)

## 2017-07-17 LAB — FREE T4: Thyroxine.free:MCnc:Pt:Ser/Plas:Qn:: 1.73 — ABNORMAL HIGH

## 2017-07-17 LAB — ANION GAP: Anion gap 3:SCnc:Pt:Ser/Plas:Qn:: 11

## 2017-07-17 LAB — MONOCYTES ABSOLUTE COUNT: Lab: 0.3

## 2017-07-17 MED ORDER — BENZONATATE 100 MG CAPSULE
ORAL_CAPSULE | Freq: Four times a day (QID) | ORAL | 1 refills | 0 days | Status: CP | PRN
Start: 2017-07-17 — End: 2017-08-14

## 2017-07-17 MED ORDER — AMLODIPINE 10 MG TABLET
ORAL_TABLET | Freq: Every day | ORAL | 6 refills | 0 days | Status: CP
Start: 2017-07-17 — End: 2017-10-16

## 2017-07-17 NOTE — Unmapped (Addendum)
Plan:  --Tessalon perels sent to you local pharmacy for cough. AVOID pseudophed given BP elevation.   --You may take benadryl as directed   --Afrin x 3 days for nasal congestion  --Motrin 600mg  every 6hours as needed for cough  --Call if no improvement or if you experience fever/chills  --Repeat imaging in 6 weeks  --RTC in 2 weeks with labs

## 2017-07-17 NOTE — Unmapped (Signed)
Genitourinary Oncology Clinic Follow-Up Note    Patient Name: Sergio Horton  Encounter Date: 07/17/2017    Referring Physician: Renford Dills, [place], [place] [facility])  Urologist: Gean Quint    Assessment/Plan:    66 y.o.M with alcoholic cirrhosis, HTN, and h/o pT3a ccRCC s/p 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 He has progressed through Sunitnib, Nivolumab via OMNIVORE clinical trial, and is now on treatment via Cabozantinb 40mg  (      1. ccRCC: On treatment via Cabozantinib 40mg  (started at 40 given liver disease)  ?? Previously treated with Sunitnib (07/27/2016-12/03/2016) Nivolumab and Ipilimumab + Nivo via OMNIVORE clinical trial (01/21/2017-05/29/2017)  ?? Cabozantinib (07/03/2017-present)        2. Cirrhosis: Has intermittent baseline leukopenia and thrombocytopia, although reports no episodes of decompensated cirrhosis and currently in normal range. Stable and well compensated.   ?? Grade 1 transaminitis today, patient asymptomatic-- will CTM closely, as he may need a dose reduction if LFTs continue to increase   ?? Educated again on the importance of avoiding tylenol    3. HTN: better controled. Currently on amlodipine only. Increased to 10mg  today given elevated BP. Will CTM.  ?? Discontinued lisinopril previously due to cough     4. Asymmetric LE edema: No DVT per recent doppler    5. Elevated TSH: PCP increased levothyroxine recently for subclinical hypothyroidism. Well controlled with normal free T4 and will monitor.    6. Ventral hernia: easily reduces. Advised wearing abdominal binder if is becomes bothersome. He is meeting with  Dr. Carlynn Purl on 07/11/2017. I will send my note to her today.      7. Elevated sCr: To note sCr ranges from 1.2- 1.6. 1.5 today  1.32    8. GERD: prilosec    9. URI: symptoms began 2 days ago, no fever/chills. Dry cough, sinus pressure and congestion. Taking OTC medications.          Plan:  -- Continue treatment on Cabozantib 40mg   -- CTM LFT's closely as patient may need dose reduction   -- Amlodipine increased to 10mg  daily  -- He will continue to use urea cream as prophylaxis to prevent PPE  -- He will call if URI does not improve  -- RTC in 2 weeks with labs        History of Present Illness:    MARCIO Horton is a 66 y.o. male with h/o right nephrectomy and caval thrombectomy for ccRCC who is seen in follow-up for locally recurrent RCC, on treatment via Omnivore     He originally presented in 2015 with a large right renal mass. He underwent nephrectomy in 06/2014 and pathology showed a 11.5cm grade 2 ccRCC with invasion into the perirenal adipose tissue (pT3a). He also had a retrocaval mass resected that also was ccRCC. Post-op course was complicated by respiratory failure requiring ECMO. He was in the hospital for a month after his surgery. Routine imaging demonstrated likely local recurrence and biopsy confirmed RCC. He initiated sunitinib on 07/27/16 and dose reduced 08/22/16 due to neutropenia, restarted 09/03/16, the dose reduced again for HFS. CT on 12/03/16 showed POD and sunitinib was stopped.  He transitioned to treatment via OMNIVORE trial (01/07/2017-06/24/2017), that utilized single agent nivolumab, until POD was noted, then he received 2 cycles of Ipi + Nivo. Repeat imaging reported POD.     Given POD he started Cabozantinib on 07/03/2017.     Interval History:  The patient is here in follow up after starting Cabozantinib. He denies any SE's related  to [place], but does endorse a 2 day hx of an URI. He denies fever/chills/body aches. (+) Sinus congestion and pressure, fatigue. BP is elevated today, he has been on amlodipine 5mg  daily.       Past Medical History:  Past Medical History:   Diagnosis Date   ??? Cirrhosis of liver (CMS-HCC)    ??? Hypertension    ??? Hypothyroidism    ??? Renal cancer (CMS-HCC)    ??? Thrombocytopathia (CMS-HCC)    Alcoholic Cirrhosis - denies any ascites or h/o encephalopathy  HTN   No history of autoimmune disorder.    Medications:    Current Outpatient Prescriptions on File Prior to Visit   Medication Sig Dispense Refill   ??? B complex-vitamin C-folic acid (RENA-VITE) 0.8 mg Tab Take 1 tablet by mouth every morning. 30 tablet 3   ??? cabozantinib 40 mg Tab Take 40 mg by mouth daily at 0600. 30 tablet 0   ??? levothyroxine (SYNTHROID, LEVOTHROID) 100 MCG tablet Take 100 mcg by mouth daily at 0600.     ??? omeprazole (PRILOSEC OTC) 20 MG tablet Take 1 tablet (20 mg total) by mouth daily. 90 tablet 3   ??? omeprazole (PRILOSEC) 20 MG capsule Take 20 mg by mouth daily.  0     No current facility-administered medications on file prior to visit.      Allergies:  No Known Allergies    Social History:  Lives in [place] with wife. He has a h/o cirrhosis from alcohol use but does not currently use alcohol. No tobacco use. No other drugs. Works as a Financial risk analyst.    Review of Systems: A 10 system review was completed and was negative except per HPI.    Physical Exam:  Vitals:    07/17/17 0914   BP: 160/97   Pulse:    Resp:    Temp:    SpO2:      ECOG PS 1    General:   No acute distress, alert and interactive   Eyes:    Extra ocular muscles intact.  Sclera not icteric.   ENT:  Oropharynx clear.   Neck:  Supple   Lymph Nodes:  No cervical adenopathy   Cardiovascular:  RRR without murmurs, rubs, gallops   Lungs:  Clear to auscultation bilaterally, without wheezes/crackles/rhonchi.   Skin:    Healing hand blisters, no other rash.    Abdomen:   Abdomen soft, not tender and not distended, +incisional hernia, well-healed incision   Extremities:   Warm and well-perfused with L>R edema.   Neurological:  Alert and oriented to person, place and time.     Labs:  Reviewed in Epic    Imaging:    05/25/16 CT CAP:  -- Status post right nephrectomy.  -- Irregular soft tissue density foci with peripheral enhancement in the right nephrectomy bed and extending inferiorly along the psoas muscle, concerning for recurrent malignancy.  -- Aortocaval lymphadenopathy.    07/04/16 Bone scan  -No evidence of metastatic osseous disease.    07/20/16 Echo:  ?? Normal left ventricular systolic function, ejection fraction > 55%  ?? Diastolic dysfunction - grade I (normal filling pressures)  ?? Normal right ventricular systolic function      09/24/16 CT CAP:  -- Status post right nephrectomy. There has been interval response to therapy, with decreasing size of recurrent tumor within the nephrectomy bed, and decreasing aortocaval nodal tissue.  -- Unchanged ultrasound morphology of liver compatible with history of cirrhosis.  12/03/16 CT CAP:  -Worsening metastatic disease in the right nephrectomy bed with enlarging aortocaval nodes/nodules and new perihepatic implant.    For reference pericaval nodes/nodules now measure up to 2 cm, previously 1.5 cm (2:93). There is new enhancing lesion in/along the right psoas musculature measuring up to 1.8 cm (2:99-106).     03/05/2017 CT CAP s/p 4C Nivo on Omnivore:  - Increase appearance of metastatic implants along the inferior right hepatic lobe. Increased size of nodularity in the right nephrectomy and increased pericaval node/nodule compatible with worsening metastatic disease.  --Increased size of pericaval nodes/nodules measure up to 2.4 cm (2:93), previously 2.0 cm  --Slightly increased size of nodularity in the right nephrectomy bed measuring 3.6 by 3.4 x 2.0 cm (2:90)  -Small-volume ascites.      06/24/2017 CT CAP  -Progressive disease with increasing nephrectomy bed soft tissue, adjacent retroperitoneal adenopathy, right psoas/paraspinal soft tissue implants, and posterior perihepatic implant. No evidence of metastatic disease in the chest.  -Small amount of nonspecific fluid/small collection adjacent to the ventral abdominal hernia sac on the right measuring 1.6 cm. ??This appears increased compared to 04/02/17 but similar to 03/05/2017.  ????-Mild circumferential bladder wall thickening which may be due to contraction versus cystitis. Recommend correlation with urinalysis.  -Cirrhotic liver morphology.      Pathology:    06/29/14:  Diagnosis:  A: ??Liver, segment 5, core needle biopsy  - Cirrhosis, etiology uncertain (see Light Microscopy) ??    B: ??Kidney and adrenal, right, radical nephrectomy, adrenalectomy, and caval  thrombectomy   Tumor histologic type/subtype: ??clear cell carcinoma  Sarcomatoid features:?????????? not identified   Histologic grade: Grade 2 (of 4) Fuhrman classification??????????  Tumor size: 11.5 cm diameter   Tumor focality: unifocal     Extent of invasion:??????????  ???? ?? Extra-capsular invasion into perirenal adipose tissue: not identified  ???? ?? Gerota's fascia: not involved   ???? ?? Renal sinus: not involved ??????????  ???? ?? Major veins (renal vein or segmental branches, IVC): involved (macro and  micro)  ???? ?? Ureter: not involved   ???? ?? Venous: ??involved   ???? ?? Lymphatic:??????????not involved   ???? ??   Histologic assessment of surgical margins:??????????  ???? ?? Peri-nephric adipose tissue margin (partial nephrectomy only): negative   ???? ?? Gerota's fascia: negative   ???? ?? Renal vein margin:??????????no adherent carcinoma identified on initial or  recuts of en face margin (B2) ??  ???? ?? Ureter margin: ??negative   Adrenal gland:?????????? negative   Lymph nodes:??????????none received     Pathologic findings in non-neoplastic kidney: focal global glomerulosclerosis   AJCC Stage (renal primary): ??pT3b (intrahepatic caval involvement) ?? pNx ?? pMx    C: ??Mass, retrocaval, excision   - Renal clear cell carcinoma, partially encapsulated, presumed confined by  Gerota's fascia, extending to uninked tissue edges    06/27/16:   Right nephrectomy bed, core biopsy and touch preparation:  - Diagnostic of malignancy.  - Morphology consistent with recurrence of patient's known clear cell renal cell carcinoma (Grade 2 of 4 Fuhrman Nuclear Grade).

## 2017-07-17 NOTE — Unmapped (Signed)
Clinical Pharmacist Practitioner: [facility]    Oral Chemotherapy Follow-Up    Assessment and recommendations:  1. Cabozantinib monitoring and side effect management- Sergio Horton is doing well since seeing starting cabozantinib on 07/08/17. He has been taking his tablets daily and not missed any doses. Reports some tenderness is left bunion. He is using creams on his hands and feet daily.   - Continue cabozantinib 40 mg daily  - Continue applying cream to hands and feet daily    2. Hypertension- BP today elevated to 160/97. Has been uncontolled the last few visits. Unlikely due to cabozantinib at this time. He currently is only taking amlodipine 5 mg daily. He reports inconsistent home BP readings.   - Increase amlodipine to 10 mg daily    3. Grade 1 AST elevation- 1.5 x ULN  - Continue to monitor with h/o cirrhosis.     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 Will plan to start cabozantinb at this time given POD noted on recent CT scan.     Interim History: Sergio Horton is doing well so far on cabozantinib, he does not note any new symptoms since starting. He is paying close attention to his hands and feet since he did have PPE when he was on [place]. He does have a cold today with cough and congestion. He started feeling poorly about 2 days ago. With regards to his blood pressure he is no longer taking lisinopril as instructed and is only taking amlodipine 5 mg daily. He says he checks his blood pressures at home and reports they have been high but can't really tell me what they were.     Adherence: Takes 1 tablet with water about 7 am and waits 1-2 hours later before he eats breakfast, he reports no missed doses, his routine in the morning helps remind him to take his medicine.     Toxicities: none    Drug-Drug Interactions: none    Medications:  Current Outpatient Prescriptions   Medication Sig Dispense Refill   ??? B complex-vitamin C-folic acid (RENA-VITE) 0.8 mg Tab Take 1 tablet by mouth every morning. 30 tablet 3   ??? cabozantinib 40 mg Tab Take 40 mg by mouth daily at 0600. 30 tablet 0   ??? levothyroxine (SYNTHROID, LEVOTHROID) 100 MCG tablet Take 100 mcg by mouth daily at 0600.     ??? omeprazole (PRILOSEC OTC) 20 MG tablet Take 1 tablet (20 mg total) by mouth daily. 90 tablet 3   ??? amLODIPine (NORVASC) 10 MG tablet Take 1 tablet (10 mg total) by mouth daily. 30 tablet 6   ??? benzonatate (TESSALON PERLES) 100 MG capsule Take 1 capsule (100 mg total) by mouth every six (6) hours as needed for cough. 30 capsule 1   ??? omeprazole (PRILOSEC) 20 MG capsule Take 20 mg by mouth daily.  0     No current facility-administered medications for this visit.      I spent 10 minutes with SergioMalmstrom in direct patient care.     Laverna Peace PharmD, BCOP, CPP  Hematology/Oncology Pharmacist  P: [phone]

## 2017-07-17 NOTE — Unmapped (Signed)
Labs drawn and sent for analysis.  Care provided by  Adolph Pollack, RN

## 2017-07-22 NOTE — Unmapped (Signed)
Refill filled on 07/17/2017 by Daylene Posey CPP

## 2017-07-29 ENCOUNTER — Ambulatory Visit: Admit: 2017-07-29 | Discharge: 2017-07-30 | Payer: MEDICARE

## 2017-07-29 ENCOUNTER — Ambulatory Visit: Admit: 2017-07-29 | Discharge: 2017-07-30 | Payer: MEDICARE | Attending: Family | Primary: Family

## 2017-07-29 DIAGNOSIS — C779 Secondary and unspecified malignant neoplasm of lymph node, unspecified: Principal | ICD-10-CM

## 2017-07-29 DIAGNOSIS — C649 Malignant neoplasm of unspecified kidney, except renal pelvis: Secondary | ICD-10-CM

## 2017-07-29 DIAGNOSIS — E039 Hypothyroidism, unspecified: Secondary | ICD-10-CM

## 2017-07-29 LAB — CBC W/ AUTO DIFF
BASOPHILS ABSOLUTE COUNT: 0 10*9/L (ref 0.0–0.1)
EOSINOPHILS ABSOLUTE COUNT: 0.1 10*9/L (ref 0.0–0.4)
HEMATOCRIT: 40.9 % — ABNORMAL LOW (ref 41.0–53.0)
HEMOGLOBIN: 13.6 g/dL (ref 13.5–17.5)
LARGE UNSTAINED CELLS: 3 % (ref 0–4)
LYMPHOCYTES ABSOLUTE COUNT: 1.5 10*9/L (ref 1.5–5.0)
MEAN CORPUSCULAR HEMOGLOBIN CONC: 33.3 g/dL (ref 31.0–37.0)
MEAN CORPUSCULAR VOLUME: 91 fL (ref 80.0–100.0)
MEAN PLATELET VOLUME: 7.3 fL (ref 7.0–10.0)
MONOCYTES ABSOLUTE COUNT: 0.3 10*9/L (ref 0.2–0.8)
NEUTROPHILS ABSOLUTE COUNT: 1.8 10*9/L — ABNORMAL LOW (ref 2.0–7.5)
PLATELET COUNT: 171 10*9/L (ref 150–440)
RED BLOOD CELL COUNT: 4.49 10*12/L — ABNORMAL LOW (ref 4.50–5.90)
RED CELL DISTRIBUTION WIDTH: 15.8 % — ABNORMAL HIGH (ref 12.0–15.0)

## 2017-07-29 LAB — BASIC METABOLIC PANEL
ANION GAP: 10 mmol/L (ref 9–15)
BLOOD UREA NITROGEN: 12 mg/dL (ref 7–21)
BUN / CREAT RATIO: 11
CALCIUM: 9.2 mg/dL (ref 8.5–10.2)
CHLORIDE: 104 mmol/L (ref 98–107)
CO2: 25 mmol/L (ref 22.0–30.0)
CREATININE: 1.13 mg/dL (ref 0.70–1.30)
EGFR MDRD AF AMER: >=60 mL/min/{1.73_m2} (ref >=60)
EGFR MDRD NON AF AMER: >=60 mL/min/{1.73_m2} (ref >=60)
GLUCOSE RANDOM: 95 mg/dL (ref 65–179)

## 2017-07-29 LAB — HEPATIC FUNCTION PANEL
ALBUMIN: 4.2 g/dL (ref 3.5–5.0)
ALT (SGPT): 70 U/L (ref 19–72)
BILIRUBIN DIRECT: 0.2 mg/dL (ref 0.00–0.40)
BILIRUBIN TOTAL: 0.5 mg/dL (ref 0.0–1.2)
PROTEIN TOTAL: 7.4 g/dL (ref 6.5–8.3)

## 2017-07-29 LAB — CALCIUM: Calcium:MCnc:Pt:Ser/Plas:Qn:: 9.2

## 2017-07-29 LAB — HEMATOCRIT: Lab: 40.9 — ABNORMAL LOW

## 2017-07-29 LAB — LACTATE DEHYDROGENASE: Lactate dehydrogenase:CCnc:Pt:Ser/Plas:Qn:: 992 — ABNORMAL HIGH

## 2017-07-29 LAB — BILIRUBIN DIRECT: Bilirubin.glucuronidated:MCnc:Pt:Ser/Plas:Qn:: 0.2

## 2017-07-29 MED ORDER — UREA 40 % TOPICAL CREAM
1 refills | 0 days | Status: CP
Start: 2017-07-29 — End: 2017-12-11

## 2017-07-29 NOTE — Unmapped (Signed)
[facility]itour[facility] Oncology Clinic Follow-Up Note    Patient Name: Sergio Horton  Encounter Date: 07/29/2017    Referring Physician: Renford Dills, [place], [place] [facility])  Urologist: Gean Quint    Assessment/Plan:    67 y.o.M with alcoholic cirrhosis, HTN, and h/o pT3a ccRCC s/p 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 He has progressed through Sunitnib, Nivolumab via OMNIVORE clinical trial, and is now on treatment via Cabozantinb 40mg        1. ccRCC: On treatment via Cabozantinib 40mg  (started at 40 given liver disease)  ?? Previously treated with Sunitnib (07/27/2016-12/03/2016) Nivolumab and Ipilimumab + Nivo via OMNIVORE clinical trial (01/21/2017-05/29/2017)  ?? Cabozantinib 40mg  (07/08/2017-present)        2. Cirrhosis: Has intermittent baseline leukopenia and thrombocytopia, although reports no episodes of decompensated cirrhosis and currently in normal range. Stable and well compensated.   ?? Grade 1 transaminitis today, patient asymptomatic-- will CTM closely, as he may need a dose reduction if LFTs continue to increase   ?? Educated again on the importance of avoiding tylenol    3. HTN: better controled. Currently on amlodipine only. Increased to 10mg  today given elevated BP. Will CTM.  ?? Discontinued lisinopril previously due to cough     4. Asymmetric LE edema: No DVT per recent doppler    5. Elevated TSH: PCP increased levothyroxine recently for subclinical hypothyroidism. Well controlled with normal free T4 and will monitor.    6. Ventral hernia: easily reduces. Advised wearing abdominal binder if is becomes bothersome. He is meeting with  Dr. Carlynn Purl on 07/11/2017. I will send my note to her today.      7. Elevated sCr: continue    8. GERD: prilosec    9. URI: Resolved     10. Health Maintenance: Has received Flu and pneumovax this year         Plan:  -- Continue treatment on Cabozantib 40mg   -- CTM LFT's closely as patient may need dose reduction   -- Continue Amlodipine 10mg  daily  -- He will continue to use urea cream as prophylaxis to prevent PPE  -- RTC in 2.5 weeks with labs        History of Present Illness:    Sergio Horton is a 67 y.o. male with h/o right nephrectomy and caval thrombectomy for ccRCC who is seen in follow-up for locally recurrent RCC, on treatment via cabozantinib.     He originally presented in 2015 with a large right renal mass. He underwent nephrectomy in 06/2014 and pathology showed a 11.5cm grade 2 ccRCC with invasion into the perirenal adipose tissue (pT3a). He also had a retrocaval mass resected that also was ccRCC. Post-op course was complicated by respiratory failure requiring ECMO. He was in the hospital for a month after his surgery. Routine imaging demonstrated likely local recurrence and biopsy confirmed RCC. He initiated sunitinib on 07/27/16 and dose reduced 08/22/16 due to neutropenia, restarted 09/03/16, the dose reduced again for HFS. CT on 12/03/16 showed POD and sunitinib was stopped.  He transitioned to treatment via OMNIVORE trial (01/07/2017-06/24/2017), that utilized single agent nivolumab, until POD was noted, then he received 2 cycles of Ipi + Nivo. Repeat imaging reported POD.     Given POD he started Cabozantinib on 07/03/2017.     Interval History:  The patient is here in follow up. He is doing well on his Cabozantinib. His BP is better controlled on 10mg  amlodipine, and his foot soreness has decreased with urea cream use. He denies diarrhea, mucositis, rash,  or focal pain. No HA or vision changes. He continues to work, and is recovering well from recent URI.       Past Medical History:  Past Medical History:   Diagnosis Date   ??? Cirrhosis of liver (CMS-HCC)    ??? Hypertension    ??? Hypothyroidism    ??? Renal cancer (CMS-HCC)    ??? Thrombocytopathia (CMS-HCC)    Alcoholic Cirrhosis - denies any ascites or h/o encephalopathy  HTN   No history of autoimmune disorder.    Medications:    Current Outpatient Prescriptions on File Prior to Visit   Medication Sig Dispense Refill   ??? amLODIPine (NORVASC) 10 MG tablet Take 1 tablet (10 mg total) by mouth daily. 30 tablet 6   ??? B complex-vitamin C-folic acid (RENA-VITE) 0.8 mg Tab Take 1 tablet by mouth every morning. 30 tablet 3   ??? benzonatate (TESSALON PERLES) 100 MG capsule Take 1 capsule (100 mg total) by mouth every six (6) hours as needed for cough. 30 capsule 1   ??? cabozantinib 40 mg Tab Take 40 mg by mouth daily at 0600. 30 tablet 0   ??? levothyroxine (SYNTHROID, LEVOTHROID) 100 MCG tablet Take 100 mcg by mouth daily at 0600.     ??? omeprazole (PRILOSEC) 20 MG capsule Take 20 mg by mouth daily.  0   ??? [DISCONTINUED] omeprazole (PRILOSEC OTC) 20 MG tablet Take 1 tablet (20 mg total) by mouth daily. (Patient not taking: Reported on 07/29/2017) 90 tablet 3     No current facility-administered medications on file prior to visit.      Allergies:  No Known Allergies    Social History:  Lives in [place] with wife. He has a h/o cirrhosis from alcohol use but does not currently use alcohol. No tobacco use. No other drugs. Works as a Financial risk analyst.    Review of Systems: A 10 system review was completed and was negative except per HPI.    Physical Exam:  Vitals:    07/29/17 0952   BP: 160/88   Pulse: 75   Resp: 16   Temp: 37.2 ??C (98.9 ??F)   SpO2: 95%     ECOG PS 1    General:   No acute distress, alert and interactive   Eyes:    Extra ocular muscles intact.  Sclera not icteric.   ENT:  Oropharynx clear.   Neck:  Supple   Lymph Nodes:  No cervical adenopathy   Cardiovascular:  RRR without murmurs, rubs, gallops   Lungs:  Clear to auscultation bilaterally, without wheezes/crackles/rhonchi.   Skin:    Healing hand blisters, no other rash.    Abdomen:   Abdomen soft, not tender and not distended, +incisional hernia, well-healed incision   Extremities:   Warm and well-perfused with L>R edema.   Neurological:  Alert and oriented to person, place and time.     Labs:  Reviewed in Epic    Imaging:    05/25/16 CT CAP:  -- Status post right nephrectomy.  -- Irregular soft tissue density foci with peripheral enhancement in the right nephrectomy bed and extending inferiorly along the psoas muscle, concerning for recurrent malignancy.  -- Aortocaval lymphadenopathy.    07/04/16 Bone scan  -No evidence of metastatic osseous disease.    07/20/16 Echo:  ?? Normal left ventricular systolic function, ejection fraction > 55%  ?? Diastolic dysfunction - grade I (normal filling pressures)  ?? Normal right ventricular systolic function      09/24/16  CT CAP:  -- Status post right nephrectomy. There has been interval response to therapy, with decreasing size of recurrent tumor within the nephrectomy bed, and decreasing aortocaval nodal tissue.  -- Unchanged ultrasound morphology of liver compatible with history of cirrhosis.    12/03/16 CT CAP:  -Worsening metastatic disease in the right nephrectomy bed with enlarging aortocaval nodes/nodules and new perihepatic implant.    For reference pericaval nodes/nodules now measure up to 2 cm, previously 1.5 cm (2:93). There is new enhancing lesion in/along the right psoas musculature measuring up to 1.8 cm (2:99-106).     03/05/2017 CT CAP s/p 4C Nivo on Omnivore:  - Increase appearance of metastatic implants along the inferior right hepatic lobe. Increased size of nodularity in the right nephrectomy and increased pericaval node/nodule compatible with worsening metastatic disease.  --Increased size of pericaval nodes/nodules measure up to 2.4 cm (2:93), previously 2.0 cm  --Slightly increased size of nodularity in the right nephrectomy bed measuring 3.6 by 3.4 x 2.0 cm (2:90)  -Small-volume ascites.      06/24/2017 CT CAP  -Progressive disease with increasing nephrectomy bed soft tissue, adjacent retroperitoneal adenopathy, right psoas/paraspinal soft tissue implants, and posterior perihepatic implant. No evidence of metastatic disease in the chest.  -Small amount of nonspecific fluid/small collection adjacent to the ventral abdominal hernia sac on the right measuring 1.6 cm. ??This appears increased compared to 04/02/17 but similar to 03/05/2017.  ????-Mild circumferential bladder wall thickening which may be due to contraction versus cystitis. Recommend correlation with urinalysis.  -Cirrhotic liver morphology.      Pathology:    06/29/14:  Diagnosis:  A: ??Liver, segment 5, core needle biopsy  - Cirrhosis, etiology uncertain (see Light Microscopy) ??    B: ??Kidney and adrenal, right, radical nephrectomy, adrenalectomy, and caval  thrombectomy   Tumor histologic type/subtype: ??clear cell carcinoma  Sarcomatoid features:?????????? not identified   Histologic grade: Grade 2 (of 4) Fuhrman classification??????????  Tumor size: 11.5 cm diameter   Tumor focality: unifocal     Extent of invasion:??????????  ???? ?? Extra-capsular invasion into perirenal adipose tissue: not identified  ???? ?? Gerota's fascia: not involved   ???? ?? Renal sinus: not involved ??????????  ???? ?? Major veins (renal vein or segmental branches, IVC): involved (macro and  micro)  ???? ?? Ureter: not involved   ???? ?? Venous: ??involved   ???? ?? Lymphatic:??????????not involved   ???? ??   Histologic assessment of surgical margins:??????????  ???? ?? Peri-nephric adipose tissue margin (partial nephrectomy only): negative   ???? ?? Gerota's fascia: negative   ???? ?? Renal vein margin:??????????no adherent carcinoma identified on initial or  recuts of en face margin (B2) ??  ???? ?? Ureter margin: ??negative   Adrenal gland:?????????? negative   Lymph nodes:??????????none received     Pathologic findings in non-neoplastic kidney: focal global glomerulosclerosis   AJCC Stage (renal primary): ??pT3b (intrahepatic caval involvement) ?? pNx ?? pMx    C: ??Mass, retrocaval, excision   - Renal clear cell carcinoma, partially encapsulated, presumed confined by  Gerota's fascia, extending to uninked tissue edges    06/27/16:   Right nephrectomy bed, core biopsy and touch preparation:  - Diagnostic of malignancy.  - Morphology consistent with recurrence of patient's known clear cell renal cell carcinoma (Grade 2 of 4 Fuhrman Nuclear Grade).

## 2017-07-29 NOTE — Unmapped (Addendum)
Plan:   --Continue Cabo 40mg  daily  --RTC on 1/23 with labs  --will Check your Thyroid level on your follow up   --Call with any issues      Lab Results   Component Value Date    WBC 3.9 (L) 07/29/2017    HGB 13.6 07/29/2017    HCT 40.9 (L) 07/29/2017    PLT 171 07/29/2017       Lab Results   Component Value Date    NA 139 07/29/2017    K 4.8 07/29/2017    CL 104 07/29/2017    CO2 25.0 07/29/2017    BUN 12 07/29/2017    CREATININE 1.13 07/29/2017    CALCIUM 9.2 07/29/2017    MG 1.8 07/19/2014    PHOS 3.5 07/19/2014     ENDOCRINE  TSH 1.017  Free T4 1.7

## 2017-07-31 MED ORDER — CABOZANTINIB 40 MG TABLET: 40 mg | each | 2 refills | 0 days

## 2017-07-31 MED ORDER — CABOZANTINIB 40 MG TABLET: 40 mg | tablet | Freq: Every day | 2 refills | 0 days | Status: AC

## 2017-07-31 MED ORDER — CABOZANTINIB 40 MG TABLET
Freq: Every day | ORAL | 2 refills | 0.00000 days | Status: CP
Start: 2017-07-31 — End: 2017-07-31

## 2017-07-31 NOTE — Unmapped (Signed)
[facility] Refill Coordination Note  Specialty Medication(s): CABOMETYX 40MG   Additional Medications shipped: NO    Sergio Horton, DOB: 12-12-1950  Phone: [phone] (home) [phone] (work), Alternate phone contact: N/A  Phone or address changes today?: No  All above HIPAA information was verified with patient.  Shipping Address: [address]  [name] [address]   Insurance changes? No    Completed refill call assessment today to schedule patient's medication shipment from the [facility] [phone]).      Confirmed the medication and dosage are correct and have not changed: Yes, regimen is correct and unchanged.    Confirmed patient started or stopped the following medications in the past month:  No, there are no changes reported at this time.    Are you tolerating your medication?:  Sergio Horton reports side effects of muscle cramps and sore feet. He has made clinic aware..    ADHERENCE    Did you miss any doses in the past 4 weeks? No missed doses reported.    FINANCIAL/SHIPPING    Delivery Scheduled: Yes, Expected medication delivery date: 08/02/17     Sergio Horton did not have any additional questions at this time.    Delivery address validated in [facility] scheduling system: Yes, address listed in [facility] is correct.    We will follow up with patient monthly for standard refill processing and delivery.      Thank you,  Marletta Lor   [facility] Specialty Pharmacist

## 2017-08-01 MED FILL — CABOMETYX/40MG/TABS: CABOMETYX/40MG/TABS | 30 days supply | Qty: 30 | Fill #0

## 2017-08-02 MED ORDER — RENA-VITE 0.8 MG TABLET
ORAL_TABLET | 3 refills | 0 days | Status: CP
Start: 2017-08-02 — End: 2017-09-11

## 2017-08-13 NOTE — Unmapped (Signed)
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 He has progressed through Sunitnib, Nivolumab + Ipilimumab via OMNIVORE clinical trial, and is now on treatment via Cabozantinb 40mg .       1. ccRCC: On treatment via Cabozantinib 40mg  (started at 40 mg given liver disease)  ?? Previously treated with Sunitnib (07/27/2016-12/03/2016), Nivolumab + Ipilimumab + Nivo via OMNIVORE clinical trial (01/21/2017-05/29/2017)  ?? Cabozantinib 40mg  (07/08/2017-present). SE's reported: PPE to bilateral toes (right worse than left), bilateral finger creases, decreased appetite, BP elevating, and LFT's mildly elevated. See below for management  ?? Will repeat imaging week of February 18      2. Cirrhosis: Has intermittent baseline leukopenia and thrombocytopia, although reports no episodes of decompensated cirrhosis and currently in normal range. Stable and well compensated.   ?? Grade 1 transaminitis stable, patient asymptomatic-- will CTM closely, as he may need a dose reduction if LFTs continue to increase   ?? Educated again on the importance of avoiding tylenol    3. HTN: better controled. Currently on amlodipine 10mg   only. Home reads 170-180/90's  ?? Discontinued lisinopril previously due to cough   ?? Will start losartan 25mg  daily for better BP control     4. Asymmetric LE edema: No DVT per recent doppler    5. Elevated TSH: Will increase synthroid to daily and repeat TSH in 4-6 weeks    6. Ventral hernia: easily reduces. Advised wearing abdominal binder if is becomes bothersome. He is meeting with  Dr. Carlynn Purl on 07/11/2017.   7. Elevated sCr: continue    8. GERD: prilosec    9. Health Maintenance: Has received Flu and pneumovax this year     10. PPE grade 2  ?? Will hold cabo x 5-7 days  ?? Continue urea cream  ?? Will send a prescription in for Diclofenac gel for discomfort        Plan:  -- Hold Cabozantinib until BP normalizes and blisters on feet improve  -- CTM LFT's closely as patient may need dose reduction   -- Continue Amlodipine 10mg  daily and will start losartan for better BP control  -- CTM BP closely at home  -- He will continue to use urea cream to blisters and will add diclofenac for pain relief  -- RTC in 2  weeks with labs and follow up        History of Present Illness:    Sergio Horton is a 67 y.o. male with h/o right nephrectomy and caval thrombectomy for ccRCC who is seen in follow-up for locally recurrent RCC, on treatment via cabozantinib.     He originally presented in 2015 with a large right renal mass. He underwent nephrectomy in 06/2014 and pathology showed a 11.5cm grade 2 ccRCC with invasion into the perirenal adipose tissue (pT3a). He also had a retrocaval mass resected that also was ccRCC. Post-op course was complicated by respiratory failure requiring ECMO. He was in the hospital for a month after his surgery. Routine imaging demonstrated likely local recurrence and biopsy confirmed RCC. He initiated sunitinib on 07/27/16 and dose reduced 08/22/16 due to neutropenia, restarted 09/03/16, the dose reduced again for HFS. CT on 12/03/16 showed POD and sunitinib was stopped.  He transitioned to treatment  via OMNIVORE trial (01/07/2017-06/24/2017), that utilized single agent nivolumab, until POD was noted, then he received 2 cycles of Ipi + Nivo. Repeat imaging reported POD.     Given POD he started Cabozantinib on 07/03/2017. He had been tolerating treatment well until the last week. He has noticed some elevation in his blood pressure as well as PPE to his right foot and bilateral hands. He is continuing on his antihypertensives as prescribed, and he continues to use the urea cream to the blister areas. He also reports decreased appetite, but his weight is stable. He denies diarrhea, HA, vision change, focal bone pain, SOB, or CP.     Interval History:  The patient is here in follow up on cabozantinib.      Past Medical History:  Past Medical History:   Diagnosis Date   ??? Cirrhosis of liver (CMS-HCC)    ??? Hypertension    ??? Hypothyroidism    ??? Renal cancer (CMS-HCC)    ??? Thrombocytopathia (CMS-HCC)    Alcoholic Cirrhosis - denies any ascites or h/o encephalopathy  HTN   No history of autoimmune disorder.    Medications:    Current Outpatient Prescriptions on File Prior to Visit   Medication Sig Dispense Refill   ??? amLODIPine (NORVASC) 10 MG tablet Take 1 tablet (10 mg total) by mouth daily. 30 tablet 6   ??? cabozantinib 40 mg Tab Take 40 mg by mouth daily. 30 tablet 2   ??? RENA-VITE 0.8 mg Tab take 1 tablet by mouth once daily every morning 30 tablet 3   ??? urea (CARMOL) 40 % cream Apply twice daily to sore areas on hands and feet 198.6 g 1   ??? [DISCONTINUED] levothyroxine (SYNTHROID, LEVOTHROID) 100 MCG tablet Take 100 mcg by mouth daily at 0600.     ??? [DISCONTINUED] benzonatate (TESSALON PERLES) 100 MG capsule Take 1 capsule (100 mg total) by mouth every six (6) hours as needed for cough. 30 capsule 1   ??? [DISCONTINUED] omeprazole (PRILOSEC) 20 MG capsule Take 20 mg by mouth daily.  0     No current facility-administered medications on file prior to visit.      Allergies:  No Known Allergies    Social History:  Lives in [place] with wife. He has a h/o cirrhosis from alcohol use but does not currently use alcohol. No tobacco use. No other drugs. Works as a Financial risk analyst.    Review of Systems: A 10 system review was completed and was negative except per HPI.    Physical Exam:  Vitals:    08/14/17 1055   BP: 176/92   Pulse:    Resp:    Temp:    SpO2:      ECOG PS 1    General:   No acute distress, alert and interactive   Eyes:    Extra ocular muscles intact.  Sclera not icteric.   ENT:  Oropharynx clear.   Neck:  Supple   Lymph Nodes:  No cervical adenopathy Cardiovascular:  RRR without murmurs, rubs, gallops   Lungs:  Clear to auscultation bilaterally, without wheezes/crackles/rhonchi.   Skin:    Healing hand blisters, no other rash.    Abdomen:   Abdomen soft, not tender and not distended, +incisional hernia, well-healed incision   Extremities:   Warm and well-perfused with L>R edema.   Neurological:  Alert and oriented to person, place and time.     Labs:  Reviewed in Epic    Imaging:  05/25/16 CT CAP:  -- Status post right nephrectomy.  -- Irregular soft tissue density foci with peripheral enhancement in the right nephrectomy bed and extending inferiorly along the psoas muscle, concerning for recurrent malignancy.  -- Aortocaval lymphadenopathy.    07/04/16 Bone scan  -No evidence of metastatic osseous disease.    07/20/16 Echo:  ?? Normal left ventricular systolic function, ejection fraction > 55%  ?? Diastolic dysfunction - grade I (normal filling pressures)  ?? Normal right ventricular systolic function      09/24/16 CT CAP:  -- Status post right nephrectomy. There has been interval response to therapy, with decreasing size of recurrent tumor within the nephrectomy bed, and decreasing aortocaval nodal tissue.  -- Unchanged ultrasound morphology of liver compatible with history of cirrhosis.    12/03/16 CT CAP:  -Worsening metastatic disease in the right nephrectomy bed with enlarging aortocaval nodes/nodules and new perihepatic implant.    For reference pericaval nodes/nodules now measure up to 2 cm, previously 1.5 cm (2:93). There is new enhancing lesion in/along the right psoas musculature measuring up to 1.8 cm (2:99-106).     03/05/2017 CT CAP s/p 4C Nivo on Omnivore:  - Increase appearance of metastatic implants along the inferior right hepatic lobe. Increased size of nodularity in the right nephrectomy and increased pericaval node/nodule compatible with worsening metastatic disease.  --Increased size of pericaval nodes/nodules measure up to 2.4 cm (2:93), previously 2.0 cm  --Slightly increased size of nodularity in the right nephrectomy bed measuring 3.6 by 3.4 x 2.0 cm (2:90)  -Small-volume ascites.      06/24/2017 CT CAP  -Progressive disease with increasing nephrectomy bed soft tissue, adjacent retroperitoneal adenopathy, right psoas/paraspinal soft tissue implants, and posterior perihepatic implant. No evidence of metastatic disease in the chest.  -Small amount of nonspecific fluid/small collection adjacent to the ventral abdominal hernia sac on the right measuring 1.6 cm. ??This appears increased compared to 04/02/17 but similar to 03/05/2017.  ????-Mild circumferential bladder wall thickening which may be due to contraction versus cystitis. Recommend correlation with urinalysis.  -Cirrhotic liver morphology.      Pathology:    06/29/14:  Diagnosis:  A: ??Liver, segment 5, core needle biopsy  - Cirrhosis, etiology uncertain (see Light Microscopy) ??    B: ??Kidney and adrenal, right, radical nephrectomy, adrenalectomy, and caval  thrombectomy   Tumor histologic type/subtype: ??clear cell carcinoma  Sarcomatoid features:?????????? not identified   Histologic grade: Grade 2 (of 4) Fuhrman classification??????????  Tumor size: 11.5 cm diameter   Tumor focality: unifocal     Extent of invasion:??????????  ???? ?? Extra-capsular invasion into perirenal adipose tissue: not identified  ???? ?? Gerota's fascia: not involved   ???? ?? Renal sinus: not involved ??????????  ???? ?? Major veins (renal vein or segmental branches, IVC): involved (macro and  micro)  ???? ?? Ureter: not involved   ???? ?? Venous: ??involved   ???? ?? Lymphatic:??????????not involved   ???? ??   Histologic assessment of surgical margins:??????????  ???? ?? Peri-nephric adipose tissue margin (partial nephrectomy only): negative   ???? ?? Gerota's fascia: negative   ???? ?? Renal vein margin:??????????no adherent carcinoma identified on initial or  recuts of en face margin (B2) ??  ???? ?? Ureter margin: ??negative   Adrenal gland:?????????? negative   Lymph nodes:??????????none received Pathologic findings in non-neoplastic kidney: focal global glomerulosclerosis   AJCC Stage (renal primary): ??pT3b (intrahepatic caval involvement) ?? pNx ?? pMx    C: ??Mass, retrocaval, excision   -  Renal clear cell carcinoma, partially encapsulated, presumed confined by  Gerota's fascia, extending to uninked tissue edges    06/27/16:   Right nephrectomy bed, core biopsy and touch preparation:  - Diagnostic of malignancy.  - Morphology consistent with recurrence of patient's known clear cell renal cell carcinoma (Grade 2 of 4 Fuhrman Nuclear Grade).

## 2017-08-14 ENCOUNTER — Other Ambulatory Visit: Admit: 2017-08-14 | Discharge: 2017-08-15 | Payer: MEDICARE

## 2017-08-14 ENCOUNTER — Ambulatory Visit: Admit: 2017-08-14 | Discharge: 2017-08-15 | Payer: MEDICARE | Attending: Family | Primary: Family

## 2017-08-14 DIAGNOSIS — C649 Malignant neoplasm of unspecified kidney, except renal pelvis: Secondary | ICD-10-CM

## 2017-08-14 DIAGNOSIS — C779 Secondary and unspecified malignant neoplasm of lymph node, unspecified: Principal | ICD-10-CM

## 2017-08-14 DIAGNOSIS — E039 Hypothyroidism, unspecified: Secondary | ICD-10-CM

## 2017-08-14 LAB — BASIC METABOLIC PANEL
ANION GAP: 9 mmol/L (ref 9–15)
BLOOD UREA NITROGEN: 17 mg/dL (ref 7–21)
BUN / CREAT RATIO: 15
CALCIUM: 9.4 mg/dL (ref 8.5–10.2)
CHLORIDE: 105 mmol/L (ref 98–107)
CO2: 22 mmol/L (ref 22.0–30.0)
CREATININE: 1.13 mg/dL (ref 0.70–1.30)
EGFR MDRD AF AMER: >=60 mL/min/{1.73_m2} (ref >=60)
POTASSIUM: 4.5 mmol/L (ref 3.5–5.0)
SODIUM: 136 mmol/L (ref 135–145)

## 2017-08-14 LAB — HEPATIC FUNCTION PANEL
ALT (SGPT): 65 U/L (ref 19–72)
AST (SGOT): 76 U/L — ABNORMAL HIGH (ref 19–55)
BILIRUBIN DIRECT: 0.3 mg/dL (ref 0.00–0.40)
BILIRUBIN TOTAL: 0.7 mg/dL (ref 0.0–1.2)
PROTEIN TOTAL: 7.4 g/dL (ref 6.5–8.3)

## 2017-08-14 LAB — WBC ADJUSTED: Lab: 4.4 — ABNORMAL LOW

## 2017-08-14 LAB — CBC W/ AUTO DIFF
BASOPHILS ABSOLUTE COUNT: 0 10*9/L (ref 0.0–0.1)
EOSINOPHILS ABSOLUTE COUNT: 0.1 10*9/L (ref 0.0–0.4)
HEMATOCRIT: 40.2 % — ABNORMAL LOW (ref 41.0–53.0)
HEMOGLOBIN: 13.9 g/dL (ref 13.5–17.5)
LYMPHOCYTES ABSOLUTE COUNT: 1.4 10*9/L — ABNORMAL LOW (ref 1.5–5.0)
MEAN CORPUSCULAR HEMOGLOBIN CONC: 34.5 g/dL (ref 31.0–37.0)
MEAN CORPUSCULAR HEMOGLOBIN: 31.6 pg (ref 26.0–34.0)
MEAN CORPUSCULAR VOLUME: 91.6 fL (ref 80.0–100.0)
MONOCYTES ABSOLUTE COUNT: 0.2 10*9/L (ref 0.2–0.8)
NEUTROPHILS ABSOLUTE COUNT: 2.6 10*9/L (ref 2.0–7.5)
PLATELET COUNT: 118 10*9/L — ABNORMAL LOW (ref 150–440)
RED BLOOD CELL COUNT: 4.39 10*12/L — ABNORMAL LOW (ref 4.50–5.90)
RED CELL DISTRIBUTION WIDTH: 18.1 % — ABNORMAL HIGH (ref 12.0–15.0)

## 2017-08-14 LAB — THYROID STIMULATING HORMONE: Thyrotropin:ACnc:Pt:Ser/Plas:Qn:: 13.45 — ABNORMAL HIGH

## 2017-08-14 LAB — ALBUMIN: Albumin:MCnc:Pt:Ser/Plas:Qn:: 4.2

## 2017-08-14 LAB — CHLORIDE: Chloride:SCnc:Pt:Ser/Plas:Qn:: 105

## 2017-08-14 LAB — FREE T4: Thyroxine.free:MCnc:Pt:Ser/Plas:Qn:: 1.19

## 2017-08-14 MED ORDER — LOSARTAN 25 MG TABLET
ORAL_TABLET | Freq: Every day | ORAL | 3 refills | 0 days | Status: CP
Start: 2017-08-14 — End: 2018-08-12

## 2017-08-14 MED ORDER — LEVOTHYROXINE 125 MCG TABLET
ORAL_TABLET | Freq: Every day | ORAL | 11 refills | 0.00000 days | Status: CP
Start: 2017-08-14 — End: 2017-09-11

## 2017-08-14 MED ORDER — DICLOFENAC 1 % TOPICAL GEL
Freq: Four times a day (QID) | TOPICAL | 0 refills | 0.00000 days | Status: CP | PRN
Start: 2017-08-14 — End: 2018-08-14

## 2017-08-14 NOTE — Unmapped (Signed)
Clinical Pharmacist Practitioner: [facility]    Oral Chemotherapy Follow-Up    Assessment and recommendations:  1. Cabozantinib monitoring and side effect management- Sergio Horton is not feeling well today. Reports pain on both heels and sores on hands. He is using urea cream on his hands and feet daily.   - Hold cabozantinib 40 mg daily for 1 week    2. Hypertension- BP today elevated to 182/98 and repeat 176/92. He currently is only taking amlodipine 10 mg daily. He reports home BP readings in the 160's/90's.   - Continue amlodpine 10 mg daily  - Start losartan 25 mg daily  - Monitor BP at home    3. Grade 2 Palmer-plantar erythrodysesthesia: edema and erythema with pain when walking. Some erythema on the hands with minimal pain.   - Holding cabozantinib, continue cream to hands and feet  - Started diclofenac gel for symptomatic pain relief on feet every 6 hour prn    4. Grade 1 AST elevation- 1.5 x ULN  - Continue to monitor with h/o cirrhosis    Follow- up: Call in one week to assess symptoms and will see at next follow up visit 08/28/17    ______________________________________________________________________    Oral Chemotherapy: Cabozantinib 40 mg daily (empirically dose adjusted due to h/o cirrhosis), held 08/15/17 for hypertension and PPE  Start date: 07/08/17    HPI: 67 y.o.M with alcoholic cirrhosis, HTN, and h/o pT3a ccRCC s/p right nephrectomy 06/2014, with local recurrence, was started on sunitinib 07/27/16-12/03/16, transitioined to treatment via OMNIVORE trial (01/07/2017-06/24/2017) with POD. Will plan to start cabozantinb at this time given POD noted on recent CT scan.     Interim History: Sergio Horton is not feeling well today. His feet are very painful especially the heels of his feet. He also has some red spots on both hands which are tender. He reports his pain is 7-8 on his feet. He says he had diarrhea for a few days last week which resolved on its own with no intervention, he had about 3 loose stools per day. He also reports what he describes as muscle spasms. He says they come in his hands and his legs and is more like a cramp. His blood pressure has been high at home on home blood pressure checks.     Adherence: Takes 1 tablet with water about 7 am and waits 1-2 hours later before he eats breakfast, he reports no missed doses, his routine in the morning helps remind him to take his medicine.      Toxicities: none    Drug-Drug Interactions: none    Medications:  Current Outpatient Prescriptions   Medication Sig Dispense Refill   ??? amLODIPine (NORVASC) 10 MG tablet Take 1 tablet (10 mg total) by mouth daily. 30 tablet 6   ??? cabozantinib 40 mg Tab Take 40 mg by mouth daily. 30 tablet 2   ??? RENA-VITE 0.8 mg Tab take 1 tablet by mouth once daily every morning 30 tablet 3   ??? urea (CARMOL) 40 % cream Apply twice daily to sore areas on hands and feet 198.6 g 1   ??? diclofenac sodium (VOLTAREN) 1 % gel Apply 2 g topically every six (6) hours as needed for arthritis. 100 g 0   ??? levothyroxine (SYNTHROID) 125 MCG tablet Take 1 tablet (125 mcg total) by mouth daily. 30 tablet 11   ??? losartan (COZAAR) 25 MG tablet Take 1 tablet (25 mg total) by mouth daily. 90 tablet 3  No current facility-administered medications for this visit.      I spent 10 minutes with Sergio Horton in direct patient care.     Laverna Peace PharmD, BCOP, CPP  Hematology/Oncology Pharmacist  P: [phone]

## 2017-08-14 NOTE — Unmapped (Addendum)
Plan:  --HOLD Cabozantinib  --Continue Amlodipine 10mg  daily  --Prescriptions for Losartan 25mg  daily (blood pressure medication), Voltaren gel 1% (pain relief cream for blisters), and increased synthroid dose daily, were sent to your local pharmacacy  --Continue to monitor blood pressure readings at home and notify us when they return to < 150/ < 90. If they do no decrease also notify us.   --RTC in 2 weeks for labs and Blood pressure check  --Will plan for repeat CT scan the week of Feb 18

## 2017-08-28 ENCOUNTER — Ambulatory Visit: Admit: 2017-08-28 | Discharge: 2017-08-29 | Payer: MEDICARE | Attending: Family | Primary: Family

## 2017-08-28 ENCOUNTER — Other Ambulatory Visit: Admit: 2017-08-28 | Discharge: 2017-08-29 | Payer: MEDICARE

## 2017-08-28 DIAGNOSIS — C779 Secondary and unspecified malignant neoplasm of lymph node, unspecified: Secondary | ICD-10-CM

## 2017-08-28 DIAGNOSIS — C649 Malignant neoplasm of unspecified kidney, except renal pelvis: Principal | ICD-10-CM

## 2017-08-28 DIAGNOSIS — I10 Essential (primary) hypertension: Secondary | ICD-10-CM

## 2017-08-28 DIAGNOSIS — C652 Malignant neoplasm of left renal pelvis: Secondary | ICD-10-CM

## 2017-08-28 DIAGNOSIS — E039 Hypothyroidism, unspecified: Secondary | ICD-10-CM

## 2017-08-28 LAB — COMPREHENSIVE METABOLIC PANEL
ALBUMIN: 3.7 g/dL (ref 3.5–5.0)
ALT (SGPT): 45 U/L (ref 19–72)
ANION GAP: 8 mmol/L — ABNORMAL LOW (ref 9–15)
AST (SGOT): 53 U/L (ref 19–55)
BLOOD UREA NITROGEN: 13 mg/dL (ref 7–21)
BUN / CREAT RATIO: 9
CALCIUM: 9.4 mg/dL (ref 8.5–10.2)
CHLORIDE: 106 mmol/L (ref 98–107)
CO2: 24 mmol/L (ref 22.0–30.0)
CREATININE: 1.37 mg/dL — ABNORMAL HIGH (ref 0.70–1.30)
EGFR MDRD AF AMER: >=60 mL/min/{1.73_m2} (ref >=60)
EGFR MDRD NON AF AMER: 52 mL/min/{1.73_m2} — ABNORMAL LOW (ref >=60)
GLUCOSE RANDOM: 95 mg/dL (ref 65–179)
POTASSIUM: 4.5 mmol/L (ref 3.5–5.0)
PROTEIN TOTAL: 6.6 g/dL (ref 6.5–8.3)
SODIUM: 138 mmol/L (ref 135–145)

## 2017-08-28 LAB — NEUTROPHILS ABSOLUTE COUNT: Lab: 2.2

## 2017-08-28 LAB — CBC W/ AUTO DIFF
BASOPHILS ABSOLUTE COUNT: 0 10*9/L (ref 0.0–0.1)
EOSINOPHILS ABSOLUTE COUNT: 0.1 10*9/L (ref 0.0–0.4)
HEMATOCRIT: 36.3 % — ABNORMAL LOW (ref 41.0–53.0)
HEMOGLOBIN: 12.2 g/dL — ABNORMAL LOW (ref 13.5–17.5)
LYMPHOCYTES ABSOLUTE COUNT: 1 10*9/L — ABNORMAL LOW (ref 1.5–5.0)
MEAN CORPUSCULAR HEMOGLOBIN CONC: 33.6 g/dL (ref 31.0–37.0)
MEAN CORPUSCULAR HEMOGLOBIN: 31.9 pg (ref 26.0–34.0)
MONOCYTES ABSOLUTE COUNT: 0.3 10*9/L (ref 0.2–0.8)
NEUTROPHILS ABSOLUTE COUNT: 2.2 10*9/L (ref 2.0–7.5)
PLATELET COUNT: 159 10*9/L (ref 150–440)
RED BLOOD CELL COUNT: 3.82 10*12/L — ABNORMAL LOW (ref 4.50–5.90)
RED CELL DISTRIBUTION WIDTH: 19.5 % — ABNORMAL HIGH (ref 12.0–15.0)
WBC ADJUSTED: 3.8 10*9/L — ABNORMAL LOW (ref 4.5–11.0)

## 2017-08-28 LAB — HEPATIC FUNCTION PANEL
ALBUMIN: 3.7 g/dL (ref 3.5–5.0)
BILIRUBIN DIRECT: 0.2 mg/dL (ref 0.00–0.40)
BILIRUBIN TOTAL: 0.5 mg/dL (ref 0.0–1.2)
PROTEIN TOTAL: 6.5 g/dL (ref 6.5–8.3)

## 2017-08-28 LAB — ALBUMIN: Albumin:MCnc:Pt:Ser/Plas:Qn:: 3.7

## 2017-08-28 LAB — PROTEIN TOTAL: Protein:MCnc:Pt:Ser/Plas:Qn:: 6.6

## 2017-08-28 MED ORDER — CABOZANTINIB 20 MG TABLET
Freq: Every day | ORAL | 3 refills | 0.00000 days | Status: CP
Start: 2017-08-28 — End: 2017-08-28

## 2017-08-28 MED ORDER — CABOZANTINIB 20 MG TABLET: each | 3 refills | 0 days

## 2017-08-28 NOTE — Unmapped (Signed)
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 He has progressed through Sunitnib, Nivolumab + Ipilimumab via OMNIVORE clinical trial, and is now on treatment via Cabozantinb 40mg .       1. ccRCC: On treatment via Cabozantinib 40mg  (started at 40 mg given liver disease)  ?? Previously treated with Sunitnib (07/27/2016-12/03/2016), Nivolumab + Ipilimumab + Nivo via OMNIVORE clinical trial (01/21/2017-05/29/2017)  ?? Cabozantinib 40mg  (07/08/2017-present). Dose reduced 20mg  given constellation of SE's including PPE, fatigue, HTN, mild transaminitis.   ?? Will repeat imaging week of February 18      2. Cirrhosis: Has intermittent baseline leukopenia and thrombocytopia, although reports no episodes of decompensated cirrhosis and currently in normal range. Stable and well compensated.   ?? Grade 1 transaminitis stable, patient asymptomatic-- will CTM closely, as he may need a dose reduction if LFTs continue to increase   ?? Educated again on the importance of avoiding tylenol    3. HTN: better controled. Currently on amlodipine 10mg   only. Home reads 170-180/90's  ?? Discontinued lisinopril previously due to cough   ?? Will start losartan 25mg  daily for better BP control     4. Asymmetric LE edema: No DVT per recent doppler    5. Elevated TSH: Will increase synthroid to daily and repeat TSH in 4-6 weeks    6. Ventral hernia: easily reduces. Advised wearing abdominal binder if is becomes bothersome. He is meeting with  Dr. Carlynn Purl on 07/11/2017.   7. Elevated sCr: continue    8. GERD: prilosec    9. Health Maintenance: Has received Flu and pneumovax this year     10. PPE --resoved  ?? Continue urea cream          Plan:  --Restart Cabozantinib 20mg  daily   -- CTM LFT's closely as patient may need dose reduction   -- Continue Amlodipine 10mg  daily and will increase losartan to 50mg  daily for better BP control  -- CTM BP closely at home  -- He will continue to use urea cream to hands and feet  -- RTC in 2  weeks with labs and follow up with CT scan         History of Present Illness:    Sergio Horton is a 67 y.o. male with h/o right nephrectomy and caval thrombectomy for ccRCC who is seen in follow-up for locally recurrent RCC, on treatment via cabozantinib.     He originally presented in 2015 with a large right renal mass. He underwent nephrectomy in 06/2014 and pathology showed a 11.5cm grade 2 ccRCC with invasion into the perirenal adipose tissue (pT3a). He also had a retrocaval mass resected that also was ccRCC. Post-op course was complicated by respiratory failure requiring ECMO. He was in the hospital for a month after his surgery. Routine imaging demonstrated likely local recurrence and biopsy confirmed RCC. He initiated sunitinib on 07/27/16 and dose reduced 08/22/16 due to neutropenia, restarted 09/03/16, the dose reduced again for HFS. CT on 12/03/16 showed POD and sunitinib was stopped.  He transitioned to treatment via OMNIVORE trial (01/07/2017-06/24/2017), that utilized single agent nivolumab, until POD was noted, then he received 2 cycles of Ipi + Nivo. Repeat imaging reported POD.     Given POD he started  Cabozantinib on 07/03/2017. He had been tolerating treatment well until the last week. He has noticed some elevation in his blood pressure as well as PPE to his right foot and bilateral hands. He is continuing on his antihypertensives as prescribed, and he continues to use the urea cream to the blister areas. He also reports decreased appetite, but his weight is stable. He denies diarrhea, HA, vision change, focal bone pain, SOB, or CP.     Interval History:  The patient is here in follow up on cabozantinib. His cabo has been on hold x 7 days, and he has had much improvement in previously reported SEs (PPE, HTN, elevated LFT's). His blisters have resolve, skin is peeling, but grossly intact. His appetite remains good, weight is stable. URI has resolved. No issues with diarrhea. No new issues to report.       Past Medical History:  Past Medical History:   Diagnosis Date   ??? Cirrhosis of liver (CMS-HCC)    ??? Hypertension    ??? Hypothyroidism    ??? Renal cancer (CMS-HCC)    ??? Thrombocytopathia (CMS-HCC)    Alcoholic Cirrhosis - denies any ascites or h/o encephalopathy  HTN   No history of autoimmune disorder.    Medications:    Current Outpatient Prescriptions on File Prior to Visit   Medication Sig Dispense Refill   ??? amLODIPine (NORVASC) 10 MG tablet Take 1 tablet (10 mg total) by mouth daily. 30 tablet 6   ??? levothyroxine (SYNTHROID) 125 MCG tablet Take 1 tablet (125 mcg total) by mouth daily. 30 tablet 11   ??? losartan (COZAAR) 25 MG tablet Take 1 tablet (25 mg total) by mouth daily. 90 tablet 3   ??? RENA-VITE 0.8 mg Tab take 1 tablet by mouth once daily every morning 30 tablet 3   ??? diclofenac sodium (VOLTAREN) 1 % gel Apply 2 g topically every six (6) hours as needed for arthritis. (Patient not taking: Reported on 08/28/2017) 100 g 0   ??? urea (CARMOL) 40 % cream Apply twice daily to sore areas on hands and feet (Patient not taking: Reported on 08/28/2017) 198.6 g 1     No current facility-administered medications on file prior to visit.      Allergies:  No Known Allergies    Social History:  Lives in [place] with wife. He has a h/o cirrhosis from alcohol use but does not currently use alcohol. No tobacco use. No other drugs. Works as a Financial risk analyst.    Review of Systems: A 10 system review was completed and was negative except per HPI.    Physical Exam:  Vitals:    08/28/17 0829   BP: 153/82   Pulse: 62   Resp: 16   Temp: 36.6 ??C (97.8 ??F)   SpO2: 96%     ECOG PS 1    General:   No acute distress, alert and interactive   Eyes:    Extra ocular muscles intact.  Sclera not icteric.   ENT:  Oropharynx clear.   Neck:  Supple   Lymph Nodes:  No cervical adenopathy   Cardiovascular:  RRR without murmurs, rubs, gallops   Lungs:  Clear to auscultation bilaterally, without wheezes/crackles/rhonchi.   Skin:    Healing hand blisters, no other rash.    Abdomen:   Abdomen soft, not tender and not distended, +incisional hernia, well-healed incision   Extremities:   Warm and well-perfused with L>R edema.   Neurological:  Alert and oriented to person,  place and time.     Labs:  Reviewed in Epic    Imaging:    05/25/16 CT CAP:  -- Status post right nephrectomy.  -- Irregular soft tissue density foci with peripheral enhancement in the right nephrectomy bed and extending inferiorly along the psoas muscle, concerning for recurrent malignancy.  -- Aortocaval lymphadenopathy.    07/04/16 Bone scan  -No evidence of metastatic osseous disease.    07/20/16 Echo:  ?? Normal left ventricular systolic function, ejection fraction > 55%  ?? Diastolic dysfunction - grade I (normal filling pressures)  ?? Normal right ventricular systolic function      09/24/16 CT CAP:  -- Status post right nephrectomy. There has been interval response to therapy, with decreasing size of recurrent tumor within the nephrectomy bed, and decreasing aortocaval nodal tissue.  -- Unchanged ultrasound morphology of liver compatible with history of cirrhosis.    12/03/16 CT CAP:  -Worsening metastatic disease in the right nephrectomy bed with enlarging aortocaval nodes/nodules and new perihepatic implant.    For reference pericaval nodes/nodules now measure up to 2 cm, previously 1.5 cm (2:93). There is new enhancing lesion in/along the right psoas musculature measuring up to 1.8 cm (2:99-106).     03/05/2017 CT CAP s/p 4C Nivo on Omnivore:  - Increase appearance of metastatic implants along the inferior right hepatic lobe. Increased size of nodularity in the right nephrectomy and increased pericaval node/nodule compatible with worsening metastatic disease.  --Increased size of pericaval nodes/nodules measure up to 2.4 cm (2:93), previously 2.0 cm  --Slightly increased size of nodularity in the right nephrectomy bed measuring 3.6 by 3.4 x 2.0 cm (2:90)  -Small-volume ascites.      06/24/2017 CT CAP  -Progressive disease with increasing nephrectomy bed soft tissue, adjacent retroperitoneal adenopathy, right psoas/paraspinal soft tissue implants, and posterior perihepatic implant. No evidence of metastatic disease in the chest.  -Small amount of nonspecific fluid/small collection adjacent to the ventral abdominal hernia sac on the right measuring 1.6 cm. ??This appears increased compared to 04/02/17 but similar to 03/05/2017.  ????-Mild circumferential bladder wall thickening which may be due to contraction versus cystitis. Recommend correlation with urinalysis.  -Cirrhotic liver morphology.      Pathology:    06/29/14:  Diagnosis:  A: ??Liver, segment 5, core needle biopsy  - Cirrhosis, etiology uncertain (see Light Microscopy) ??    B: ??Kidney and adrenal, right, radical nephrectomy, adrenalectomy, and caval  thrombectomy   Tumor histologic type/subtype: ??clear cell carcinoma  Sarcomatoid features:?????????? not identified   Histologic grade: Grade 2 (of 4) Fuhrman classification??????????  Tumor size: 11.5 cm diameter   Tumor focality: unifocal     Extent of invasion:??????????  ???? ?? Extra-capsular invasion into perirenal adipose tissue: not identified  ???? ?? Gerota's fascia: not involved   ???? ?? Renal sinus: not involved ??????????  ???? ?? Major veins (renal vein or segmental branches, IVC): involved (macro and  micro)  ???? ?? Ureter: not involved   ???? ?? Venous: ??involved   ???? ?? Lymphatic:??????????not involved   ???? ??   Histologic assessment of surgical margins:??????????  ???? ?? Peri-nephric adipose tissue margin (partial nephrectomy only): negative   ???? ?? Gerota's fascia: negative   ???? ?? Renal vein margin:??????????no adherent carcinoma identified on initial or  recuts of en face margin (B2) ???? ?? Ureter margin: ??negative   Adrenal gland:?????????? negative   Lymph nodes:??????????none received     Pathologic findings in non-neoplastic kidney: focal global glomerulosclerosis  AJCC Stage (renal primary): ??pT3b (intrahepatic caval involvement) ?? pNx ?? pMx    C: ??Mass, retrocaval, excision   - Renal clear cell carcinoma, partially encapsulated, presumed confined by  Gerota's fascia, extending to uninked tissue edges    06/27/16:   Right nephrectomy bed, core biopsy and touch preparation:  - Diagnostic of malignancy.  - Morphology consistent with recurrence of patient's known clear cell renal cell carcinoma (Grade 2 of 4 Fuhrman Nuclear Grade).

## 2017-08-28 NOTE — Unmapped (Signed)
Clinical Pharmacist Practitioner: [facility]    Oral Chemotherapy Follow-Up    Assessment and recommendations:  1. Cabozantinib monitoring and side effect management- Mr. Hohmann is feeling well today. Since stopping his cabozantinib his feet have felt much improved and his blood pressure has decreased.   - Restart cabozantinib 20 mg daily    2. Hypertension- BP today still slightly elevated to 153/82. He currently is taking amlodipine 10 mg daily and losartan 25 mg daily. He reports home BP readings within range  - Continue amlodpine 10 mg daily  - Increase losartan to 50 mg daily  - Monitor BP at home    3. Grade 2 Palmer-plantar erythrodysesthesia: resolved while holding cabozantinib   - Continues urea cream twice daily  - He was unable to pick up diclofenac gel for symptomatic pain relief, will follow up with pharmacy so he can get this medication for the future.     4. Grade 1 AST elevation- 1.5 x ULN  - Continue to monitor with h/o cirrhosis    Follow- up: Returning to clinic 09/11/17    ______________________________________________________________________    Oral Chemotherapy: Cabozantinib 40 mg daily (empirically dose adjusted due to h/o cirrhosis), held 08/15/17-08/28/17 for hypertension and PPE restarting at 20 mg daily   Start date: 07/08/17    HPI: 67 y.o.M with alcoholic cirrhosis, HTN, and h/o pT3a ccRCC s/p right nephrectomy 06/2014, with local recurrence, was started on sunitinib 07/27/16-12/03/16, transitioined to treatment via OMNIVORE trial (01/07/2017-06/24/2017) with POD. Will plan to start cabozantinb at this time given POD noted on recent CT scan.     Interim History: Mr. Poffenberger is feeling much better today. His The pain in his feet has resolved, all of the callus blisters have dried up he has continues to use urea cream on hands and feet. He reports his home blood pressure readings have been improved since stopping the cabozantinib.     Adherence: He will resume his cabozantinib 20 mg (1 tablet) each morning on a empty stomach      Toxicities: HTN, PPE    Drug-Drug Interactions: none    Medications:  Current Outpatient Prescriptions   Medication Sig Dispense Refill   ??? amLODIPine (NORVASC) 10 MG tablet Take 1 tablet (10 mg total) by mouth daily. 30 tablet 6   ??? levothyroxine (SYNTHROID) 125 MCG tablet Take 1 tablet (125 mcg total) by mouth daily. 30 tablet 11   ??? losartan (COZAAR) 25 MG tablet Take 1 tablet (25 mg total) by mouth daily. 90 tablet 3   ??? RENA-VITE 0.8 mg Tab take 1 tablet by mouth once daily every morning 30 tablet 3   ??? cabozantinib 20 mg Tab Take 20 mg by mouth daily at 0600. 30 tablet 3   ??? diclofenac sodium (VOLTAREN) 1 % gel Apply 2 g topically every six (6) hours as needed for arthritis. (Patient not taking: Reported on 08/28/2017) 100 g 0   ??? urea (CARMOL) 40 % cream Apply twice daily to sore areas on hands and feet (Patient not taking: Reported on 08/28/2017) 198.6 g 1     No current facility-administered medications for this visit.      I spent 10 minutes with Mr.Marsiglia in direct patient care.     Laverna Peace PharmD, BCOP, CPP  Hematology/Oncology Pharmacist  P: [phone]

## 2017-08-28 NOTE — Unmapped (Signed)
[facility] Specialty Medication Referral: No PA required    Medication (Brand/Generic): Cabometyx 20mg     Initial FSI Test Claim completed with resulted information below:  No PA required  Patient ABLE to fill at [facility]  Insurance Company:  [employer] / grant  Anticipated Copay: $0    As Co-pay is under $100 defined limit, per policy there will be no further investigation of need for financial assistance at this time unless patient requests. This referral has been communicated to the provider and handed off to the [facility] team for further processing and filling of prescribed medication.   ______________________________________________________________________  Please utilize this referral for viewing purposes as it will serve as the central location for all relevant documentation and updates.

## 2017-08-28 NOTE — Unmapped (Signed)
Labs drawn and sent for analysis.  Care provided by  D Maxwell, RN

## 2017-08-28 NOTE — Unmapped (Addendum)
Plan:  -- Increase Losartan 50mg  daily  -- Continue Synthroid daily  -- Continue Cabozantinib 40mg  daily  -- RTC in 2 weeks with CT scan and labs      Lab Results   Component Value Date    WBC 3.8 (L) 08/28/2017    HGB 12.2 (L) 08/28/2017    HCT 36.3 (L) 08/28/2017    PLT 159 08/28/2017       Lab Results   Component Value Date    NA 138 08/28/2017    K 4.5 08/28/2017    CL 106 08/28/2017    CO2 24.0 08/28/2017    BUN 13 08/28/2017    CREATININE 1.37 (H) 08/28/2017    CALCIUM 9.4 08/28/2017    MG 1.8 07/19/2014    PHOS 3.5 07/19/2014

## 2017-08-29 MED FILL — CABOMETYX 20MG TAB/20MG/TABS: CABOMETYX 20MG TAB/20MG/TABS | 30 days supply | Qty: 30 | Fill #0

## 2017-08-29 NOTE — Unmapped (Signed)
[facility] Refill Coordination Note  Specialty Medication(s): Cabometyx 20mg   Additional Medications shipped: none    Sergio Horton, DOB: 04/22/1951  Phone: [phone] (home) [phone] (work), Alternate phone contact: N/A  Phone or address changes today?: No  All above HIPAA information was verified with patient.  Shipping Address: [address]  [name] [address]   Insurance changes? No    Completed refill call assessment today to schedule patient's medication shipment from the [facility] [phone]).      Confirmed the medication and dosage are correct and have not changed: No, patient reports changes to the regimen as follows: new dose is 20mg  daily, pt is aware    Confirmed patient started or stopped the following medications in the past month:  No, there are no changes reported at this time.    Are you tolerating your medication?:  Sergio Horton reports tolerating the medication.    ADHERENCE    Did you miss any doses in the past 4 weeks? No missed doses reported.    FINANCIAL/SHIPPING    Delivery Scheduled: Yes, Expected medication delivery date: 08/30/17     Sergio Horton did not have any additional questions at this time.    Delivery address validated in FSI scheduling system: Yes, address listed in FSI is correct.    We will follow up with patient monthly for standard refill processing and delivery.      Thank you,  Sergio Horton   [facility] Specialty Pharmacist

## 2017-09-09 ENCOUNTER — Ambulatory Visit: Admit: 2017-09-09 | Discharge: 2017-09-09 | Payer: MEDICARE

## 2017-09-09 DIAGNOSIS — C649 Malignant neoplasm of unspecified kidney, except renal pelvis: Secondary | ICD-10-CM

## 2017-09-09 DIAGNOSIS — C652 Malignant neoplasm of left renal pelvis: Principal | ICD-10-CM

## 2017-09-11 ENCOUNTER — Other Ambulatory Visit: Admit: 2017-09-11 | Discharge: 2017-09-12 | Payer: MEDICARE

## 2017-09-11 ENCOUNTER — Ambulatory Visit: Admit: 2017-09-11 | Discharge: 2017-09-12 | Payer: MEDICARE | Attending: Family | Primary: Family

## 2017-09-11 DIAGNOSIS — C779 Secondary and unspecified malignant neoplasm of lymph node, unspecified: Principal | ICD-10-CM

## 2017-09-11 DIAGNOSIS — E079 Disorder of thyroid, unspecified: Secondary | ICD-10-CM

## 2017-09-11 DIAGNOSIS — C649 Malignant neoplasm of unspecified kidney, except renal pelvis: Secondary | ICD-10-CM

## 2017-09-11 LAB — CBC W/ AUTO DIFF
BASOPHILS ABSOLUTE COUNT: 0 10*9/L (ref 0.0–0.1)
BASOPHILS RELATIVE PERCENT: 0.7 %
EOSINOPHILS ABSOLUTE COUNT: 0.4 10*9/L (ref 0.0–0.4)
EOSINOPHILS RELATIVE PERCENT: 7.2 %
HEMATOCRIT: 40.2 % — ABNORMAL LOW (ref 41.0–53.0)
LARGE UNSTAINED CELLS: 4 % (ref 0–4)
LYMPHOCYTES ABSOLUTE COUNT: 1.6 10*9/L (ref 1.5–5.0)
LYMPHOCYTES RELATIVE PERCENT: 33.6 %
MEAN CORPUSCULAR HEMOGLOBIN CONC: 33.1 g/dL (ref 31.0–37.0)
MEAN CORPUSCULAR HEMOGLOBIN: 32 pg (ref 26.0–34.0)
MEAN CORPUSCULAR VOLUME: 96.7 fL (ref 80.0–100.0)
MEAN PLATELET VOLUME: 7.7 fL (ref 7.0–10.0)
MONOCYTES ABSOLUTE COUNT: 0.3 10*9/L (ref 0.2–0.8)
MONOCYTES RELATIVE PERCENT: 6.3 %
NEUTROPHILS ABSOLUTE COUNT: 2.3 10*9/L (ref 2.0–7.5)
NEUTROPHILS RELATIVE PERCENT: 47.9 %
PLATELET COUNT: 213 10*9/L (ref 150–440)
RED CELL DISTRIBUTION WIDTH: 18.3 % — ABNORMAL HIGH (ref 12.0–15.0)
WBC ADJUSTED: 4.8 10*9/L (ref 4.5–11.0)

## 2017-09-11 LAB — BASIC METABOLIC PANEL
ANION GAP: 11 mmol/L (ref 9–15)
BLOOD UREA NITROGEN: 13 mg/dL (ref 7–21)
BUN / CREAT RATIO: 10
CALCIUM: 9.1 mg/dL (ref 8.5–10.2)
CHLORIDE: 102 mmol/L (ref 98–107)
CO2: 25 mmol/L (ref 22.0–30.0)
CREATININE: 1.26 mg/dL (ref 0.70–1.30)
EGFR MDRD AF AMER: >=60 mL/min/{1.73_m2} (ref >=60)
GLUCOSE RANDOM: 79 mg/dL (ref 65–179)
POTASSIUM: 4.8 mmol/L (ref 3.5–5.0)
SODIUM: 138 mmol/L (ref 135–145)

## 2017-09-11 LAB — EOSINOPHILS RELATIVE PERCENT: Lab: 7.2

## 2017-09-11 LAB — HEPATIC FUNCTION PANEL
ALBUMIN: 4.2 g/dL (ref 3.5–5.0)
ALKALINE PHOSPHATASE: 95 U/L (ref 38–126)
ALT (SGPT): 50 U/L (ref 19–72)
AST (SGOT): 50 U/L (ref 19–55)
BILIRUBIN DIRECT: 0.3 mg/dL (ref 0.00–0.40)

## 2017-09-11 LAB — ALBUMIN: Albumin:MCnc:Pt:Ser/Plas:Qn:: 4.2

## 2017-09-11 LAB — THYROID STIMULATING HORMONE: Thyrotropin:ACnc:Pt:Ser/Plas:Qn:: 0.457 — ABNORMAL LOW

## 2017-09-11 LAB — ANION GAP: Anion gap 3:SCnc:Pt:Ser/Plas:Qn:: 11

## 2017-09-11 MED ORDER — VITAMIN B COMPLEX-VITAMIN C-FOLIC ACID 0.8 MG TABLET
ORAL_TABLET | Freq: Every day | ORAL | 12 refills | 0.00000 days | Status: CP
Start: 2017-09-11 — End: 2017-12-11

## 2017-09-11 MED ORDER — LEVOTHYROXINE 100 MCG TABLET
ORAL_TABLET | Freq: Every day | ORAL | 11 refills | 0.00000 days | Status: CP
Start: 2017-09-11 — End: 2018-01-08

## 2017-09-11 MED ORDER — VITAMIN B COMPLEX-VITAMIN C-FOLIC ACID 0.8 MG TABLET: 1 | tablet | Freq: Every day | 6 refills | 0 days | Status: AC

## 2017-09-11 MED ORDER — VITAMIN B COMPLEX-VITAMIN C-FOLIC ACID 0.8 MG TABLET: tablet | 12 refills | 0 days | Status: AC

## 2017-09-11 NOTE — Unmapped (Signed)
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 He has progressed through Sunitnib, Nivolumab + Ipilimumab via OMNIVORE clinical trial, and is now on treatment via Cabozantinb 40mg .       1. ccRCC: On treatment via Cabozantinib 40mg  (started at 40 mg given liver disease)  ?? Previously treated with Sunitnib (07/27/2016-12/03/2016), Nivolumab + Ipilimumab + Nivo via OMNIVORE clinical trial (01/21/2017-05/29/2017)  ?? Cabozantinib 40mg  (07/08/2017-present). Dose reduced 20mg  given constellation of SE's including PPE, fatigue, HTN, mild transaminitis.   ?? We reviewed his most recent imaging today from 2/18 reporting overall disease stability with some interval decrease noted in adenopathy and retroperitoneal mass. We discussed that this is a good initial response to Cabozantinib      2. Cirrhosis: Has intermittent baseline leukopenia and thrombocytopia, although reports no episodes of decompensated cirrhosis and currently in normal range. Stable and well compensated.   ?? Grade 1 transaminitis stable, patient asymptomatic-- will CTM closely, as he may need a dose reduction if LFTs continue to increase   ?? Educated again on the importance of avoiding tylenol    3. HTN: better controled. Currently on amlodipine 10mg   only. Home reads 170-180/90's  ?? Discontinued lisinopril previously due to cough   ?? Will start losartan 25mg  daily for better BP control     4. Asymmetric LE edema: No DVT per recent doppler    5. Elevated TSH: TSH decreased to 0.4 today. Will decrease synthroid    6. Ventral hernia: easily reduces. Advised wearing abdominal binder if is becomes bothersome. He is met with  Dr. Carlynn Purl on 07/11/2017, who advised holding off on surgery until he is in a stable place in regards to renal cancer response to treatment, as he will have to hold drug before and after surgery.     7. Elevated sCr: stable; CTM     8. GERD: prilosec    9. Health Maintenance: Has received Flu and pneumovax this year     10. PPE --resoved  ?? Continue urea cream          Plan:  -- Continue Cabozantinib 20mg  daily   -- CTM LFT's closely   -- Decrease synthroid to -- new prescription sent. Will CTM TSH/ free T4  -- Renewed Rena vite  -- CTM BP closely at home  -- He will continue to use urea cream to hands and feet  -- RTC in 4 weeks with labs        History of Present Illness:    Sergio Horton is a 67 y.o. male with h/o right nephrectomy and caval thrombectomy for ccRCC who is seen in follow-up for locally recurrent RCC, on treatment via cabozantinib.     He originally presented in 2015 with a large right renal mass. He underwent nephrectomy in 06/2014 and pathology showed a 11.5cm grade 2 ccRCC with invasion into the perirenal adipose tissue (pT3a). He also had a retrocaval mass resected that also was ccRCC. Post-op course was complicated by respiratory failure requiring ECMO. He was in the hospital for a month after his surgery. Routine imaging demonstrated likely local recurrence and biopsy confirmed RCC. He initiated sunitinib on 07/27/16 and dose reduced 08/22/16 due to neutropenia, restarted 09/03/16, the dose reduced again for  HFS. CT on 12/03/16 showed POD and sunitinib was stopped.  He transitioned to treatment via OMNIVORE trial (01/07/2017-06/24/2017), that utilized single agent nivolumab, until POD was noted, then he received 2 cycles of Ipi + Nivo. Repeat imaging reported POD.     Given POD he started Cabozantinib on 07/03/2017. He had been tolerating treatment well until the last week. He has noticed some elevation in his blood pressure as well as PPE to his right foot and bilateral hands. He is continuing on his antihypertensives as prescribed, and he continues to use the urea cream to the blister areas. He also reports decreased appetite, but his weight is stable. He denies diarrhea, HA, vision change, focal bone pain, SOB, or CP.     Interval History:  The patient is here in follow up on cabozantinib. He is doing well with no reportable SEs today.       Past Medical History:  Past Medical History:   Diagnosis Date   ??? Cirrhosis of liver (CMS-HCC)    ??? Hypertension    ??? Hypothyroidism    ??? Renal cancer (CMS-HCC)    ??? Thrombocytopathia (CMS-HCC)    Alcoholic Cirrhosis - denies any ascites or h/o encephalopathy  HTN   No history of autoimmune disorder.    Medications:    Current Outpatient Prescriptions on File Prior to Visit   Medication Sig Dispense Refill   ??? amLODIPine (NORVASC) 10 MG tablet Take 1 tablet (10 mg total) by mouth daily. 30 tablet 6   ??? cabozantinib 20 mg Tab Take 20 mg by mouth daily at 0600. 30 tablet 3   ??? levothyroxine (SYNTHROID) 125 MCG tablet Take 1 tablet (125 mcg total) by mouth daily. 30 tablet 11   ??? losartan (COZAAR) 25 MG tablet Take 1 tablet (25 mg total) by mouth daily. 90 tablet 3   ??? urea (CARMOL) 40 % cream Apply twice daily to sore areas on hands and feet 198.6 g 1   ??? [DISCONTINUED] RENA-VITE 0.8 mg Tab take 1 tablet by mouth once daily every morning 30 tablet 3   ??? diclofenac sodium (VOLTAREN) 1 % gel Apply 2 g topically every six (6) hours as needed for arthritis. (Patient not taking: Reported on 08/28/2017) 100 g 0     No current facility-administered medications on file prior to visit.      Allergies:  No Known Allergies    Social History:  Lives in [place] with wife. He has a h/o cirrhosis from alcohol use but does not currently use alcohol. No tobacco use. No other drugs. Works as a Financial risk analyst.    Review of Systems: A 10 system review was completed and was negative except per HPI.    Physical Exam:  Vitals:    09/11/17 0920   BP: 125/72   Pulse: 62   Resp: 16   Temp: 36.5 ??C (97.7 ??F)   SpO2: 96%     ECOG PS 1    General:   No acute distress, alert and interactive   Eyes:    Extra ocular muscles intact.  Sclera not icteric.   ENT:  Oropharynx clear.   Neck:  Supple   Lymph Nodes:  No cervical adenopathy   Cardiovascular:  RRR without murmurs, rubs, gallops   Lungs:  Clear to auscultation bilaterally, without wheezes/crackles/rhonchi.   Skin:    Healing hand blisters, no other rash.    Abdomen:   Abdomen soft, not tender and not distended, +incisional hernia, well-healed incision  Extremities:   Warm and well-perfused with L>R edema.   Neurological:  Alert and oriented to person, place and time.     Labs:  Reviewed in Epic    Imaging:    05/25/16 CT CAP:  -- Status post right nephrectomy.  -- Irregular soft tissue density foci with peripheral enhancement in the right nephrectomy bed and extending inferiorly along the psoas muscle, concerning for recurrent malignancy.  -- Aortocaval lymphadenopathy.    07/04/16 Bone scan  -No evidence of metastatic osseous disease.    07/20/16 Echo:  ?? Normal left ventricular systolic function, ejection fraction > 55%  ?? Diastolic dysfunction - grade I (normal filling pressures)  ?? Normal right ventricular systolic function      09/24/16 CT CAP:  -- Status post right nephrectomy. There has been interval response to therapy, with decreasing size of recurrent tumor within the nephrectomy bed, and decreasing aortocaval nodal tissue.  -- Unchanged ultrasound morphology of liver compatible with history of cirrhosis.    12/03/16 CT CAP:  -Worsening metastatic disease in the right nephrectomy bed with enlarging aortocaval nodes/nodules and new perihepatic implant.    For reference pericaval nodes/nodules now measure up to 2 cm, previously 1.5 cm (2:93). There is new enhancing lesion in/along the right psoas musculature measuring up to 1.8 cm (2:99-106).     03/05/2017 CT CAP s/p 4C Nivo on Omnivore:  - Increase appearance of metastatic implants along the inferior right hepatic lobe. Increased size of nodularity in the right nephrectomy and increased pericaval node/nodule compatible with worsening metastatic disease.  --Increased size of pericaval nodes/nodules measure up to 2.4 cm (2:93), previously 2.0 cm  --Slightly increased size of nodularity in the right nephrectomy bed measuring 3.6 by 3.4 x 2.0 cm (2:90)  -Small-volume ascites.      06/24/2017 CT CAP  -Progressive disease with increasing nephrectomy bed soft tissue, adjacent retroperitoneal adenopathy, right psoas/paraspinal soft tissue implants, and posterior perihepatic implant. No evidence of metastatic disease in the chest.  -Small amount of nonspecific fluid/small collection adjacent to the ventral abdominal hernia sac on the right measuring 1.6 cm. ??This appears increased compared to 04/02/17 but similar to 03/05/2017.  ????-Mild circumferential bladder wall thickening which may be due to contraction versus cystitis. Recommend correlation with urinalysis.  -Cirrhotic liver morphology.    09/11/2017 CT CAP s/p 8 weeks Cabozantinib  1. Stable to slight interval decrease in necrotic metastatic implants in the right renal fossa and necrotic aortocaval adenopathy. Stable perihepatic implant.   **Centrally necrotic 2.4 x 2.1 cm mass abutting and likely invading the adjacent right psoas muscle. This is not significantly changed from prior examination which time it measured 2.5 x 2.3 cm on remeasurement.   **3.1 x 1.9 cm necrotic mass (1:37), previously previously 3.8 x 2.2 cm.   **Aortocaval adenopathy measures up to 2.6 cm in short axis, previously 2.8 cm (1:30). A more inferior aortocaval node measures 2.5 cm, previously 2.7 cm (1:44).   2. Small amount of fluid along the right lateral aspect of the small and large bowel containing ventral hernia, nonspecific. No evidence of obstruction at the hernia site.-- he has seen a general surgeon recently regarding possible repair  3. Cirrhotic liver with small caliber gastrohepatic and esophageal varices.  4. No evidence of thoracic metastatic disease.      Pathology:    06/29/14:  Diagnosis:  A: ??Liver, segment 5, core needle biopsy  - Cirrhosis, etiology uncertain (see Light Microscopy) ??    B: ??Kidney and  adrenal, right, radical nephrectomy, adrenalectomy, and caval  thrombectomy   Tumor histologic type/subtype: ??clear cell carcinoma  Sarcomatoid features:?????????? not identified   Histologic grade: Grade 2 (of 4) Fuhrman classification??????????  Tumor size: 11.5 cm diameter   Tumor focality: unifocal     Extent of invasion:??????????  ???? ?? Extra-capsular invasion into perirenal adipose tissue: not identified  ???? ?? Gerota's fascia: not involved   ???? ?? Renal sinus: not involved ??????????  ???? ?? Major veins (renal vein or segmental branches, IVC): involved (macro and  micro)  ???? ?? Ureter: not involved   ???? ?? Venous: ??involved   ???? ?? Lymphatic:??????????not involved   ???? ??   Histologic assessment of surgical margins:??????????  ???? ?? Peri-nephric adipose tissue margin (partial nephrectomy only): negative   ???? ?? Gerota's fascia: negative   ???? ?? Renal vein margin:??????????no adherent carcinoma identified on initial or  recuts of en face margin (B2) ??  ???? ?? Ureter margin: ??negative   Adrenal gland:?????????? negative   Lymph nodes:??????????none received     Pathologic findings in non-neoplastic kidney: focal global glomerulosclerosis   AJCC Stage (renal primary): ??pT3b (intrahepatic caval involvement) ?? pNx ?? pMx    C: ??Mass, retrocaval, excision   - Renal clear cell carcinoma, partially encapsulated, presumed confined by  Gerota's fascia, extending to uninked tissue edges    06/27/16:   Right nephrectomy bed, core biopsy and touch preparation:  - Diagnostic of malignancy.  - Morphology consistent with recurrence of patient's known clear cell renal cell carcinoma (Grade 2 of 4 Fuhrman Nuclear Grade).

## 2017-09-11 NOTE — Unmapped (Addendum)
You were seen in the [facility] Oncology Clinic today. We will plan to:    1. Continue cabozantinib 20mg  daily  2. RTC in 4 weeks for follow up and labs  3. We will repeat imaging in 8 weeks          [name], FNP-BC  GU Medical Oncology  [email or web address]          Contact information:    In the event of an emergency call 911    [facility]:  Phone: [phone]  Fax: [phone]    After hours/nights/weekends:  Hospital Operator: [phone]    Lab Results   Component Value Date    WBC 4.8 09/11/2017    HGB 13.3 (L) 09/11/2017    HCT 40.2 (L) 09/11/2017    PLT 213 09/11/2017       Lab Results   Component Value Date    NA 138 09/11/2017    K 4.8 09/11/2017    CL 102 09/11/2017    CO2 25.0 09/11/2017    BUN 13 09/11/2017    CREATININE 1.26 09/11/2017    CALCIUM 9.1 09/11/2017    MG 1.8 07/19/2014    PHOS 3.5 07/19/2014

## 2017-09-11 NOTE — Unmapped (Signed)
Labs drawn and sent for analysis.  Care provided by  Wyman Songster, RN

## 2017-09-17 NOTE — Unmapped (Signed)
[facility] Refill Coordination Note  Specialty Medication(s): Cabometyx 20mg       HOA DERISO, DOB: 1950/09/02  Phone: [phone] (home) [phone] (work), Alternate phone contact: N/A  Phone or address changes today?: No  All above HIPAA information was verified with patient.  Shipping Address: [address]  [name] [address]   Insurance changes? No    Completed refill call assessment today to schedule patient's medication shipment from the [facility] [phone]).      Confirmed the medication and dosage are correct and have not changed: Yes, regimen is correct and unchanged.    Confirmed patient started or stopped the following medications in the past month:  No, there are no changes reported at this time.    Are you tolerating your medication?:  Yvonne reports tolerating the medication.    ADHERENCE    Is this medicine transplant or covered by Medicare Part B? No.        Did you miss any doses in the past 4 weeks? No missed doses reported.    FINANCIAL/SHIPPING    Delivery Scheduled: Yes, Expected medication delivery date: 09/24/17     The patient will receive an FSI print out for each medication shipped and additional FDA Medication Guides as required.  Patient education from [place] or Robet Leu may also be included in the shipment.    Suede did not have any additional questions at this time.    Delivery address validated in FSI scheduling system: Yes, address listed in FSI is correct.    We will follow up with patient monthly for standard refill processing and delivery.      Thank you,  [employer]   [facility] Specialty Pharmacist

## 2017-09-23 MED FILL — CABOMETYX 20MG TAB/20MG/TABS: CABOMETYX 20MG TAB/20MG/TABS | 30 days supply | Qty: 30 | Fill #1

## 2017-10-16 ENCOUNTER — Other Ambulatory Visit: Admit: 2017-10-16 | Discharge: 2017-10-16 | Payer: MEDICARE

## 2017-10-16 ENCOUNTER — Ambulatory Visit: Admit: 2017-10-16 | Discharge: 2017-10-16 | Payer: MEDICARE | Attending: Family | Primary: Family

## 2017-10-16 DIAGNOSIS — I10 Essential (primary) hypertension: Secondary | ICD-10-CM

## 2017-10-16 DIAGNOSIS — C649 Malignant neoplasm of unspecified kidney, except renal pelvis: Secondary | ICD-10-CM

## 2017-10-16 DIAGNOSIS — C779 Secondary and unspecified malignant neoplasm of lymph node, unspecified: Principal | ICD-10-CM

## 2017-10-16 DIAGNOSIS — E039 Hypothyroidism, unspecified: Secondary | ICD-10-CM

## 2017-10-16 LAB — COMPREHENSIVE METABOLIC PANEL
ALBUMIN: 4.1 g/dL (ref 3.5–5.0)
ALKALINE PHOSPHATASE: 97 U/L (ref 38–126)
ANION GAP: 10 mmol/L (ref 9–15)
AST (SGOT): 65 U/L — ABNORMAL HIGH (ref 19–55)
BILIRUBIN TOTAL: 0.6 mg/dL (ref 0.0–1.2)
BLOOD UREA NITROGEN: 13 mg/dL (ref 7–21)
BUN / CREAT RATIO: 12
CALCIUM: 9 mg/dL (ref 8.5–10.2)
CHLORIDE: 108 mmol/L — ABNORMAL HIGH (ref 98–107)
CO2: 21 mmol/L — ABNORMAL LOW (ref 22.0–30.0)
CREATININE: 1.11 mg/dL (ref 0.70–1.30)
EGFR MDRD AF AMER: >=60 mL/min/{1.73_m2} (ref >=60)
GLUCOSE RANDOM: 98 mg/dL (ref 65–179)
POTASSIUM: 4.2 mmol/L (ref 3.5–5.0)
PROTEIN TOTAL: 7.3 g/dL (ref 6.5–8.3)
SODIUM: 139 mmol/L (ref 135–145)

## 2017-10-16 LAB — CBC W/ AUTO DIFF
BASOPHILS ABSOLUTE COUNT: 0 10*9/L (ref 0.0–0.1)
EOSINOPHILS ABSOLUTE COUNT: 0.3 10*9/L (ref 0.0–0.4)
EOSINOPHILS RELATIVE PERCENT: 6.7 %
HEMATOCRIT: 42.1 % (ref 41.0–53.0)
HEMOGLOBIN: 14.3 g/dL (ref 13.5–17.5)
LYMPHOCYTES ABSOLUTE COUNT: 1.3 10*9/L — ABNORMAL LOW (ref 1.5–5.0)
MEAN CORPUSCULAR HEMOGLOBIN CONC: 33.9 g/dL (ref 31.0–37.0)
MEAN CORPUSCULAR HEMOGLOBIN: 33 pg (ref 26.0–34.0)
MEAN CORPUSCULAR VOLUME: 97.5 fL (ref 80.0–100.0)
MEAN PLATELET VOLUME: 7.8 fL (ref 7.0–10.0)
MONOCYTES ABSOLUTE COUNT: 0.3 10*9/L (ref 0.2–0.8)
MONOCYTES RELATIVE PERCENT: 5.6 %
NEUTROPHILS ABSOLUTE COUNT: 2.3 10*9/L (ref 2.0–7.5)
NEUTROPHILS RELATIVE PERCENT: 51.4 %
PLATELET COUNT: 186 10*9/L (ref 150–440)
RED BLOOD CELL COUNT: 4.32 10*12/L — ABNORMAL LOW (ref 4.50–5.90)
RED CELL DISTRIBUTION WIDTH: 18 % — ABNORMAL HIGH (ref 12.0–15.0)
WBC ADJUSTED: 4.4 10*9/L — ABNORMAL LOW (ref 4.5–11.0)

## 2017-10-16 LAB — CREATININE: Creatinine:MCnc:Pt:Ser/Plas:Qn:: 1.11

## 2017-10-16 LAB — RED BLOOD CELL COUNT: Lab: 4.32 — ABNORMAL LOW

## 2017-10-16 MED ORDER — AMLODIPINE 10 MG TABLET
ORAL_TABLET | Freq: Every day | ORAL | 6 refills | 0 days | Status: CP
Start: 2017-10-16 — End: 2018-05-28

## 2017-10-16 NOTE — Unmapped (Addendum)
You were seen in the [facility] Oncology Clinic today. We will plan to:    1. Continue Cabozantinib 20mg  daily   2. RTC in 1 month with CT scan   3. Call with any questions or concerns        [name], FNP-BC  [facility] Medical Oncology  [email or web address]          Contact information:    In the event of an emergency call 911    [facility] Hospital:  Phone: [phone]  Fax: [phone]    After hours/nights/weekends:  Hospital Operator: [phone]    Lab Results   Component Value Date    WBC 4.4 (L) 10/16/2017    HGB 14.3 10/16/2017    HCT 42.1 10/16/2017    PLT 186 10/16/2017       Lab Results   Component Value Date    NA 139 10/16/2017    K 4.2 10/16/2017    CL 108 (H) 10/16/2017    CO2 21.0 (L) 10/16/2017    BUN 13 10/16/2017    CREATININE 1.11 10/16/2017    CALCIUM 9.0 10/16/2017    MG 1.8 07/19/2014    PHOS 3.5 07/19/2014       Lab Results   Component Value Date    PT 12.4 12/24/2016    INR 1.06 12/24/2016    APTT 31.2 12/24/2016

## 2017-10-16 NOTE — Unmapped (Signed)
Labs drawn and sent for analysis.  Care provided by  Leota Sauers.

## 2017-10-16 NOTE — Unmapped (Signed)
Genitourinary Oncology Clinic Follow-Up Note    Patient Name: Sergio Horton  Encounter Date: 10/16/2017    Referring Physician: Renford Dills, [place], [place] [facility])  Urologist: Gean Quint    Assessment/Plan:    67 y.o.M with alcoholic cirrhosis, HTN, and h/o pT3a ccRCC s/p 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 He has progressed through Sunitnib, Nivolumab + Ipilimumab via OMNIVORE clinical trial, and is now on treatment via Cabozantinb 20mg .       1. ccRCC: On treatment via Cabozantinib 40mg  (started at 40 mg given liver disease)  ?? Previously treated with Sunitnib (07/27/2016-12/03/2016), Nivolumab + Ipilimumab + Nivo via OMNIVORE clinical trial (01/21/2017-05/29/2017)  ?? Cabozantinib 40mg  (07/08/2017-present). Dose reduced 20mg  given constellation of SE's including PPE, fatigue, HTN, mild transaminitis.   ?? We reviewed his most recent imaging today from 2/18 reporting overall disease stability with some interval decrease noted in adenopathy and retroperitoneal mass. We discussed that this is a good initial response to Cabozantinib      2. Cirrhosis: Has intermittent baseline leukopenia and thrombocytopia, although reports no episodes of decompensated cirrhosis and currently in normal range. Stable and well compensated.   ?? Grade 1 transaminitis stable, patient asymptomatic-- will CTM closely, as he may need a dose reduction if LFTs continue to increase   ?? Educated again on the importance of avoiding tylenol    3. HTN: better controled. Currently on amlodipine 10mg   only. Home reads 170-180/90's  ?? Discontinued lisinopril previously due to cough   ?? Will start losartan 25mg  daily for better BP control     4. Asymmetric LE edema: No DVT per recent doppler    5. Elevated TSH: TSH decreased to 0.4 today. Will decrease synthroid    6. Ventral hernia: easily reduces. Advised wearing abdominal binder if is becomes bothersome. He is met with  Dr. Carlynn Purl on 07/11/2017, who advised holding off on surgery until he is in a stable place in regards to renal cancer response to treatment, as he will have to hold drug before and after surgery.     7. Elevated sCr: stable; CTM     8. GERD: prilosec    9. Health Maintenance: Has received Flu and pneumovax this year     10. PPE --resoved  ?? Continue urea cream          Plan:  -- Continue Cabozantinib 20mg  daily   -- CTM LFT's closely   -- Decrease synthroid to -- new prescription sent. Will CTM TSH/ free T4  -- Renewed amlodipine today   -- CTM BP closely at home  -- He will continue to use urea cream to hands and feet  -- RTC in 4 weeks with labs and CT scan         History of Present Illness:    Sergio Horton is a 67 y.o. male with h/o right nephrectomy and caval thrombectomy for ccRCC who is seen in follow-up for locally recurrent RCC, on treatment via cabozantinib.     He originally presented in 2015 with a large right renal mass. He underwent nephrectomy in 06/2014 and pathology showed a 11.5cm grade 2 ccRCC with invasion into the perirenal adipose tissue (pT3a). He also had a retrocaval mass resected that also was ccRCC. Post-op course was complicated by respiratory failure requiring ECMO. He was in the hospital for a month after his surgery. Routine imaging demonstrated likely local recurrence and biopsy confirmed RCC. He initiated sunitinib on 07/27/16 and dose reduced 08/22/16 due to neutropenia, restarted 09/03/16,  the dose reduced again for HFS. CT on 12/03/16 showed POD and sunitinib was stopped.  He transitioned to treatment via OMNIVORE trial (01/07/2017-06/24/2017), that utilized single agent nivolumab, until POD was noted, then he received 2 cycles of Ipi + Nivo. Repeat imaging reported POD.     Given POD he started Cabozantinib on 07/03/2017. He had been tolerating treatment well until the last week. He has noticed some elevation in his blood pressure as well as PPE to his right foot and bilateral hands. He is continuing on his antihypertensives as prescribed, and he continues to use the urea cream to the blister areas. He also reports decreased appetite, but his weight is stable. He denies diarrhea, HA, vision change, focal bone pain, SOB, or CP.     Interval History:  The patient is here in follow up on cabozantinib. He is doing well with no reportable SEs today.       Past Medical History:  Past Medical History:   Diagnosis Date   ??? Cirrhosis of liver (CMS-HCC)    ??? Hypertension    ??? Hypothyroidism    ??? Renal cancer (CMS-HCC)    ??? Thrombocytopathia (CMS-HCC)    Alcoholic Cirrhosis - denies any ascites or h/o encephalopathy  HTN   No history of autoimmune disorder.    Medications:    Current Outpatient Prescriptions on File Prior to Visit   Medication Sig Dispense Refill   ??? B complex-vitamin C-folic acid (RENA-VITE) 0.8 mg Tab Take 1 tablet daily 30 tablet 12   ??? B complex-vitamin C-folic acid (RENA-VITE) 0.8 mg Tab Take 1 tablet by mouth daily. 90 tablet 0   ??? cabozantinib 20 mg Tab Take 20 mg by mouth daily at 0600. 30 tablet 3   ??? diclofenac sodium (VOLTAREN) 1 % gel Apply 2 g topically every six (6) hours as needed for arthritis. 100 g 0   ??? levothyroxine (SYNTHROID) 100 MCG tablet Take 1 tablet (100 mcg total) by mouth daily. 30 tablet 11   ??? losartan (COZAAR) 25 MG tablet Take 1 tablet (25 mg total) by mouth daily. 90 tablet 3   ??? urea (CARMOL) 40 % cream Apply twice daily to sore areas on hands and feet 198.6 g 1   ??? [DISCONTINUED] amLODIPine (NORVASC) 10 MG tablet Take 1 tablet (10 mg total) by mouth daily. 30 tablet 6   ??? B complex-vitamin C-folic acid (RENA-VITE) 0.8 mg Tab Take 1 tablet by mouth daily. 90 tablet 6     No current facility-administered medications on file prior to visit.      Allergies:  No Known Allergies    Social History:  Lives in [place] with wife. He has a h/o cirrhosis from alcohol use but does not currently use alcohol. No tobacco use. No other drugs. Works as a Financial risk analyst.    Review of Systems: A 10 system review was completed and was negative except per HPI.    Physical Exam:  Vitals:    10/16/17 0840   BP: 138/78   Pulse: 59   Resp: 16   Temp: 36.4 ??C (97.6 ??F)   SpO2: 99%     ECOG PS 1    General:   No acute distress, alert and interactive   Eyes:    Extra ocular muscles intact.  Sclera not icteric.   ENT:  Oropharynx clear.   Neck:  Supple   Lymph Nodes:  No cervical adenopathy   Cardiovascular:  RRR without murmurs, rubs, gallops  Lungs:  Clear to auscultation bilaterally, without wheezes/crackles/rhonchi.   Skin:    Healing hand blisters, no other rash.    Abdomen:   Abdomen soft, not tender and not distended, +incisional hernia, well-healed incision   Extremities:   Warm and well-perfused with L>R edema.   Neurological:  Alert and oriented to person, place and time.     Labs:  Reviewed in Epic    Imaging:    05/25/16 CT CAP:  -- Status post right nephrectomy.  -- Irregular soft tissue density foci with peripheral enhancement in the right nephrectomy bed and extending inferiorly along the psoas muscle, concerning for recurrent malignancy.  -- Aortocaval lymphadenopathy.    07/04/16 Bone scan  -No evidence of metastatic osseous disease.    07/20/16 Echo:  ?? Normal left ventricular systolic function, ejection fraction > 55%  ?? Diastolic dysfunction - grade I (normal filling pressures)  ?? Normal right ventricular systolic function      09/24/16 CT CAP:  -- Status post right nephrectomy. There has been interval response to therapy, with decreasing size of recurrent tumor within the nephrectomy bed, and decreasing aortocaval nodal tissue.  -- Unchanged ultrasound morphology of liver compatible with history of cirrhosis.    12/03/16 CT CAP:  -Worsening metastatic disease in the right nephrectomy bed with enlarging aortocaval nodes/nodules and new perihepatic implant.    For reference pericaval nodes/nodules now measure up to 2 cm, previously 1.5 cm (2:93). There is new enhancing lesion in/along the right psoas musculature measuring up to 1.8 cm (2:99-106).     03/05/2017 CT CAP s/p 4C Nivo on Omnivore:  - Increase appearance of metastatic implants along the inferior right hepatic lobe. Increased size of nodularity in the right nephrectomy and increased pericaval node/nodule compatible with worsening metastatic disease.  --Increased size of pericaval nodes/nodules measure up to 2.4 cm (2:93), previously 2.0 cm  --Slightly increased size of nodularity in the right nephrectomy bed measuring 3.6 by 3.4 x 2.0 cm (2:90)  -Small-volume ascites.      06/24/2017 CT CAP  -Progressive disease with increasing nephrectomy bed soft tissue, adjacent retroperitoneal adenopathy, right psoas/paraspinal soft tissue implants, and posterior perihepatic implant. No evidence of metastatic disease in the chest.  -Small amount of nonspecific fluid/small collection adjacent to the ventral abdominal hernia sac on the right measuring 1.6 cm. ??This appears increased compared to 04/02/17 but similar to 03/05/2017.  ????-Mild circumferential bladder wall thickening which may be due to contraction versus cystitis. Recommend correlation with urinalysis.  -Cirrhotic liver morphology.    09/11/2017 CT CAP s/p 8 weeks Cabozantinib  1. Stable to slight interval decrease in necrotic metastatic implants in the right renal fossa and necrotic aortocaval adenopathy. Stable perihepatic implant.   **Centrally necrotic 2.4 x 2.1 cm mass abutting and likely invading the adjacent right psoas muscle. This is not significantly changed from prior examination which time it measured 2.5 x 2.3 cm on remeasurement.   **3.1 x 1.9 cm necrotic mass (1:37), previously previously 3.8 x 2.2 cm.   **Aortocaval adenopathy measures up to 2.6 cm in short axis, previously 2.8 cm (1:30). A more inferior aortocaval node measures 2.5 cm, previously 2.7 cm (1:44).   2. Small amount of fluid along the right lateral aspect of the small and large bowel containing ventral hernia, nonspecific. No evidence of obstruction at the hernia site.-- he has seen a general surgeon recently regarding possible repair  3. Cirrhotic liver with small caliber gastrohepatic and esophageal varices.  4. No evidence  of thoracic metastatic disease.      Pathology:    06/29/14:  Diagnosis:  A: ??Liver, segment 5, core needle biopsy  - Cirrhosis, etiology uncertain (see Light Microscopy) ??    B: ??Kidney and adrenal, right, radical nephrectomy, adrenalectomy, and caval  thrombectomy   Tumor histologic type/subtype: ??clear cell carcinoma  Sarcomatoid features:?????????? not identified   Histologic grade: Grade 2 (of 4) Fuhrman classification??????????  Tumor size: 11.5 cm diameter   Tumor focality: unifocal     Extent of invasion:??????????  ???? ?? Extra-capsular invasion into perirenal adipose tissue: not identified  ???? ?? Gerota's fascia: not involved   ???? ?? Renal sinus: not involved ??????????  ???? ?? Major veins (renal vein or segmental branches, IVC): involved (macro and  micro)  ???? ?? Ureter: not involved   ???? ?? Venous: ??involved   ???? ?? Lymphatic:??????????not involved   ???? ??   Histologic assessment of surgical margins:??????????  ???? ?? Peri-nephric adipose tissue margin (partial nephrectomy only): negative   ???? ?? Gerota's fascia: negative   ???? ?? Renal vein margin:??????????no adherent carcinoma identified on initial or  recuts of en face margin (B2) ??  ???? ?? Ureter margin: ??negative   Adrenal gland:?????????? negative   Lymph nodes:??????????none received     Pathologic findings in non-neoplastic kidney: focal global glomerulosclerosis   AJCC Stage (renal primary): ??pT3b (intrahepatic caval involvement) ?? pNx ?? pMx    C: ??Mass, retrocaval, excision   - Renal clear cell carcinoma, partially encapsulated, presumed confined by  Gerota's fascia, extending to uninked tissue edges    06/27/16:   Right nephrectomy bed, core biopsy and touch preparation:  - Diagnostic of malignancy.  - Morphology consistent with recurrence of patient's known clear cell renal cell carcinoma (Grade 2 of 4 Fuhrman Nuclear Grade).

## 2017-10-17 NOTE — Unmapped (Signed)
[facility] Refill Coordination Note  Specialty Medication(s): Cabometyx 20mg     Sergio Horton, DOB: Sep 26, 1950  Phone: [phone] (home) [phone] (work), Alternate phone contact: N/A  Phone or address changes today?: No  All above HIPAA information was verified with patient.  Shipping Address: [address]  [place] [address]   Insurance changes? No    Completed refill call assessment today to schedule patient's medication shipment from the [facility] [phone]).      Confirmed the medication and dosage are correct and have not changed: Yes, regimen is correct and unchanged.    Confirmed patient started or stopped the following medications in the past month:  No, there are no changes reported at this time.    Are you tolerating your medication?:  Sergio Horton reports tolerating the medication.    ADHERENCE    Did you miss any doses in the past 4 weeks? No missed doses reported.    FINANCIAL/SHIPPING    Delivery Scheduled: Yes, Expected medication delivery date: 10/22/17     The patient will receive an FSI print out for each medication shipped and additional FDA Medication Guides as required.  Patient education from [place] or Robet Leu may also be included in the shipment    [place] did not have any additional questions at this time.    Delivery address validated in FSI scheduling system: Yes, address listed in FSI is correct.    We will follow up with patient monthly for standard refill processing and delivery.      Thank you,  [employer]   [facility] Specialty Pharmacist

## 2017-10-21 MED FILL — CABOMETYX 20MG TAB/20MG/TABS: CABOMETYX 20MG TAB/20MG/TABS | 30 days supply | Qty: 30 | Fill #2

## 2017-11-13 ENCOUNTER — Ambulatory Visit: Admit: 2017-11-13 | Discharge: 2017-11-13 | Payer: MEDICARE | Attending: Family | Primary: Family

## 2017-11-13 ENCOUNTER — Other Ambulatory Visit: Admit: 2017-11-13 | Discharge: 2017-11-13 | Payer: MEDICARE

## 2017-11-13 DIAGNOSIS — C779 Secondary and unspecified malignant neoplasm of lymph node, unspecified: Secondary | ICD-10-CM

## 2017-11-13 DIAGNOSIS — C649 Malignant neoplasm of unspecified kidney, except renal pelvis: Secondary | ICD-10-CM

## 2017-11-13 DIAGNOSIS — E039 Hypothyroidism, unspecified: Secondary | ICD-10-CM

## 2017-11-13 DIAGNOSIS — C651 Malignant neoplasm of right renal pelvis: Secondary | ICD-10-CM

## 2017-11-13 LAB — COMPREHENSIVE METABOLIC PANEL
ALBUMIN: 3.5 g/dL (ref 3.5–5.0)
ALKALINE PHOSPHATASE: 81 U/L (ref 38–126)
ALT (SGPT): 49 U/L (ref 19–72)
ANION GAP: 6 mmol/L — ABNORMAL LOW (ref 9–15)
AST (SGOT): 55 U/L (ref 19–55)
BILIRUBIN TOTAL: 0.5 mg/dL (ref 0.0–1.2)
BUN / CREAT RATIO: 11
CALCIUM: 8.8 mg/dL (ref 8.5–10.2)
CHLORIDE: 109 mmol/L — ABNORMAL HIGH (ref 98–107)
CO2: 21 mmol/L — ABNORMAL LOW (ref 22.0–30.0)
EGFR MDRD AF AMER: >=60 mL/min/{1.73_m2} (ref >=60)
EGFR MDRD NON AF AMER: >=60 mL/min/{1.73_m2} (ref >=60)
GLUCOSE RANDOM: 102 mg/dL (ref 65–179)
POTASSIUM: 4 mmol/L (ref 3.5–5.0)
PROTEIN TOTAL: 6.6 g/dL (ref 6.5–8.3)
SODIUM: 136 mmol/L (ref 135–145)

## 2017-11-13 LAB — CBC W/ AUTO DIFF
BASOPHILS ABSOLUTE COUNT: 0 10*9/L (ref 0.0–0.1)
EOSINOPHILS ABSOLUTE COUNT: 0.2 10*9/L (ref 0.0–0.4)
EOSINOPHILS RELATIVE PERCENT: 4.5 %
HEMATOCRIT: 38.2 % — ABNORMAL LOW (ref 41.0–53.0)
HEMOGLOBIN: 13.1 g/dL — ABNORMAL LOW (ref 13.5–17.5)
LARGE UNSTAINED CELLS: 3 % (ref 0–4)
LYMPHOCYTES ABSOLUTE COUNT: 1.5 10*9/L (ref 1.5–5.0)
LYMPHOCYTES RELATIVE PERCENT: 34.1 %
MEAN CORPUSCULAR HEMOGLOBIN CONC: 34.3 g/dL (ref 31.0–37.0)
MEAN CORPUSCULAR HEMOGLOBIN: 33.7 pg (ref 26.0–34.0)
MEAN CORPUSCULAR VOLUME: 98.2 fL (ref 80.0–100.0)
MEAN PLATELET VOLUME: 7.5 fL (ref 7.0–10.0)
MONOCYTES ABSOLUTE COUNT: 0.3 10*9/L (ref 0.2–0.8)
MONOCYTES RELATIVE PERCENT: 7.5 %
NEUTROPHILS ABSOLUTE COUNT: 2.2 10*9/L (ref 2.0–7.5)
NEUTROPHILS RELATIVE PERCENT: 50.7 %
PLATELET COUNT: 178 10*9/L (ref 150–440)
RED BLOOD CELL COUNT: 3.89 10*12/L — ABNORMAL LOW (ref 4.50–5.90)
RED CELL DISTRIBUTION WIDTH: 17.6 % — ABNORMAL HIGH (ref 12.0–15.0)

## 2017-11-13 LAB — CO2: Carbon dioxide:SCnc:Pt:Ser/Plas:Qn:: 21 — ABNORMAL LOW

## 2017-11-13 LAB — RED BLOOD CELL COUNT: Lab: 3.89 — ABNORMAL LOW

## 2017-11-13 LAB — THYROID STIMULATING HORMONE: Thyrotropin:ACnc:Pt:Ser/Plas:Qn:: 6.52 — ABNORMAL HIGH

## 2017-11-13 LAB — FREE T4: Thyroxine.free:MCnc:Pt:Ser/Plas:Qn:: 1.32

## 2017-11-13 LAB — T4, FREE: FREE T4: 1.32 ng/dL (ref 0.71–1.40)

## 2017-11-13 NOTE — Unmapped (Signed)
Clinical Pharmacist Practitioner: [facility]    Oral Chemotherapy Follow-Up  Assessment and recommendations:    1. Cabozantinib monitoring and side effect management- Mr. Nyce is tolerating the lower dose of cabozantinib 20 mg well. - Continue cabozantinib 20 mg daily     2. Hypertension- BP today elevated at 156/82. Repeat BP 142/78. Home BP readings WNL at 130s/70s though sometimes elevated at 170s/80s. Losartan 25 mg was not increased to 50 mg as recommended previously.   - Continue amlodipine 10 mg daily  - Continue losartan 25 mg daily  - Monitor BP at home     3. Grade 1 Palmer-plantar erythrodysesthesia: resolved with decreased dose of cabozantinib   - Continue urea cream twice daily  - Continue diclofenac gel PRN for symptomatic pain relief    4. Diarrhea- Unlikely diarrhea related to cabozantinib administration given restart date of 08/28/17 is earlier than his reported onset of diarrhea episodes.   - Instructed patient to contact the clinic if diarrhea returns.     5. Monitoring previous AST elevation- WNL  - Continue to monitor with h/o cirrhosis    Follow- up: Returning to clinic 12/11/17    ______________________________________________________________________    Oral Chemotherapy: Cabozantinib 40 mg daily (empirically dose adjusted due to h/o cirrhosis), held 08/15/17-08/28/17 for hypertension and PPE restarting at 20 mg daily   Start date: 07/08/17    HPI: 67 y.o.M with alcoholic cirrhosis, HTN, and h/o pT3a ccRCC s/p right nephrectomy 06/2014, with local recurrence, was started on sunitinib 07/27/16-12/03/16, transitioined to treatment via OMNIVORE trial (01/07/2017-06/24/2017) with POD. Will plan to start cabozantinb at this time given POD noted on recent CT scan.     Interm History: Mr. Pargas reports he is doing well today. He did experience a week of diarrhea episodes (3-5 loose BM/daily) earlier this month that has independently resolved. This was accompanied by sharp stomach pain. He reports the pain is localized in the stomach area (not esophagus pain) and now occurs at a decreased frequency. There have been no changes in his appetite or diet. The tenderness/blistering in his hands and feet have resolved. He continues to use urea cream daily as well as diclofenac gel PRN for mild soreness. He reports home blood pressure readings typically in the 130-140/70s range with a few readings elevated at 170/80. Of note, the patient continues to take losartan 25 mg despite previous recommendations to increase to losartan 50 mg.      Adherence: Takes cabozantinib 20 mg (1 tablet) each morning (7AM) on an empty stomach. Reports no missed doses.    Toxicities: HTN, PPE    Drug-Drug Interactions: none    Medications:  Current Outpatient Medications   Medication Sig Dispense Refill   ??? amLODIPine (NORVASC) 10 MG tablet Take 1 tablet (10 mg total) by mouth daily. 30 tablet 6   ??? B complex-vitamin C-folic acid (RENA-VITE) 0.8 mg Tab Take 1 tablet daily 30 tablet 12   ??? cabozantinib 20 mg Tab Take 20 mg by mouth daily at 0600. 30 tablet 3   ??? diclofenac sodium (VOLTAREN) 1 % gel Apply 2 g topically every six (6) hours as needed for arthritis. 100 g 0   ??? levothyroxine (SYNTHROID) 100 MCG tablet Take 1 tablet (100 mcg total) by mouth daily. 30 tablet 11   ??? losartan (COZAAR) 25 MG tablet Take 1 tablet (25 mg total) by mouth daily. 90 tablet 3   ??? omeprazole 20 mg tablet Take 20 mg by mouth daily.     ???  urea (CARMOL) 40 % cream Apply twice daily to sore areas on hands and feet 198.6 g 1     No current facility-administered medications for this visit.      I spent 10 minutes with Mr.Zbikowski in direct patient care.    Entered by Carole Binning, PharmD Candidate, acting as scribe for Girtha Rm, PharmD CPP 69 24, 2019 2:31 PM    The documentation recorded by the scribe accurately reflects the service I personally performed and the decisions made by me.    Laverna Peace PharmD, BCOP, CPP  Hematology/Oncology Pharmacist P: [phone]

## 2017-11-13 NOTE — Unmapped (Signed)
0830:  Labs drawn and sent for analysis.  Care provided by  Will Bonnet.

## 2017-11-13 NOTE — Unmapped (Signed)
Genitourinary Oncology Clinic Follow-Up Note    Patient Name: Sergio Horton  Encounter Date: 11/13/2017    Referring Physician: Renford Dills, [place], [place] [facility])  Urologist: Gean Quint    Assessment/Plan:    67 y.o.M with alcoholic cirrhosis, HTN, and h/o pT3a ccRCC s/p 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 He has progressed through Sunitnib, Nivolumab + Ipilimumab via OMNIVORE clinical trial, and is now on treatment via Cabozantinb 20mg .       1. ccRCC: On treatment via Cabozantinib 40mg  (started at 40 mg given liver disease)  ?? Previously treated with Sunitnib (07/27/2016-12/03/2016), Nivolumab + Ipilimumab + Nivo via OMNIVORE clinical trial (01/21/2017-05/29/2017)  ?? Cabozantinib 40mg  (07/08/2017-present). Dose reduced 20mg  given constellation of SE's including PPE, fatigue, HTN, mild transaminitis.   ?? Most recent imaging  from 09/09/2017 reported overall disease stability with some interval decrease noted in adenopathy and retroperitoneal mass. We previously discussed that this is a good initial response to Cabozantinib. We will repeat imaging every 8 weeks      2. Cirrhosis: Has intermittent baseline leukopenia and thrombocytopia, although reports no episodes of decompensated cirrhosis and currently in normal range. Stable and well compensated.   ?? Grade 1 transaminitis--resolved  ?? Educated again on the importance of avoiding tylenol    3. HTN: better controled. Currently on amlodipine 10mg   only. Home reads 170-180/90's  ?? Discontinued lisinopril previously due to cough   ?? Will start losartan 25mg  daily for better BP control     4. Asymmetric LE edema: No DVT per recent doppler    5. Elevated TSH: On synthroid, CTM    6. Ventral hernia: easily reduces. Advised wearing abdominal binder if is becomes bothersome. He is met with  Dr. Carlynn Purl on 07/11/2017, who advised holding off on surgery until he is in a stable place in regards to renal cancer response to treatment, as he will have to hold drug before and after surgery.     7. Elevated sCr: stable; CTM     8. GERD: prilosec    9. Health Maintenance: Has received Flu and pneumovax this year     10. PPE --resoved  ?? Continue urea cream          Plan:  -- Continue Cabozantinib 20mg  daily   -- CTM LFT's and TSH closely   -- CTM BP closely at home  -- He will continue to use urea cream to hands and feet  -- RTC in 4 weeks with labs and CT scan         History of Present Illness:    Sergio Horton is a 67 y.o. male with h/o right nephrectomy and caval thrombectomy for ccRCC who is seen in follow-up for locally recurrent RCC, on treatment via cabozantinib.     He originally presented in 2015 with a large right renal mass. He underwent nephrectomy in 06/2014 and pathology showed a 11.5cm grade 2 ccRCC with invasion into the perirenal adipose tissue (pT3a). He also had a retrocaval mass resected that also was ccRCC. Post-op course was complicated by respiratory failure requiring ECMO. He was in the hospital for a month after his surgery. Routine imaging demonstrated likely local recurrence and biopsy confirmed RCC. He initiated sunitinib on 07/27/16 and dose reduced 08/22/16 due to neutropenia, restarted 09/03/16, the dose reduced again for HFS. CT on 12/03/16 showed POD and sunitinib was stopped.  He transitioned to treatment via OMNIVORE trial (01/07/2017-06/24/2017), that utilized single agent nivolumab, until POD was noted, then he received 2 cycles  of Ipi + Nivo. Repeat imaging reported POD.     Given POD he started Cabozantinib on 07/03/2017.     Interval History:  The patient is here in follow up on cabozantinib. He continues to do well, with resolution of all SEs. BP is stable, he denies diarrhea, rash, PPE. Parastomal hernia is still the biggest complaint.       Past Medical History:  Past Medical History:   Diagnosis Date   ??? Cirrhosis of liver (CMS-HCC)    ??? Hypertension    ??? Hypothyroidism    ??? Renal cancer (CMS-HCC)    ??? Thrombocytopathia (CMS-HCC)    Alcoholic Cirrhosis - denies any ascites or h/o encephalopathy  HTN   No history of autoimmune disorder.    Medications:    Current Outpatient Medications on File Prior to Visit   Medication Sig Dispense Refill   ??? amLODIPine (NORVASC) 10 MG tablet Take 1 tablet (10 mg total) by mouth daily. 30 tablet 6   ??? B complex-vitamin C-folic acid (RENA-VITE) 0.8 mg Tab Take 1 tablet daily 30 tablet 12   ??? cabozantinib 20 mg Tab Take 20 mg by mouth daily at 0600. 30 tablet 3   ??? diclofenac sodium (VOLTAREN) 1 % gel Apply 2 g topically every six (6) hours as needed for arthritis. 100 g 0   ??? levothyroxine (SYNTHROID) 100 MCG tablet Take 1 tablet (100 mcg total) by mouth daily. 30 tablet 11   ??? losartan (COZAAR) 25 MG tablet Take 1 tablet (25 mg total) by mouth daily. 90 tablet 3   ??? omeprazole 20 mg tablet Take 20 mg by mouth daily.     ??? urea (CARMOL) 40 % cream Apply twice daily to sore areas on hands and feet 198.6 g 1     No current facility-administered medications on file prior to visit.      Allergies:  No Known Allergies    Social History:  Lives in [place] with wife. He has a h/o cirrhosis from alcohol use but does not currently use alcohol. No tobacco use. No other drugs. Works as a Financial risk analyst.    Review of Systems: A 10 system review was completed and was negative except per HPI.    Physical Exam:  Vitals:    11/13/17 0917   BP: 156/82   Pulse: 58   Resp: 16   Temp: 36.7 ??C (98.1 ??F)   SpO2: 98%     ECOG PS 1    General:   No acute distress, alert and interactive   Eyes:    Extra ocular muscles intact.  Sclera not icteric.   ENT:  Oropharynx clear.   Neck:  Supple   Lymph Nodes:  No cervical adenopathy   Cardiovascular:  RRR without murmurs, rubs, gallops   Lungs:  Clear to auscultation bilaterally, without wheezes/crackles/rhonchi.   Skin:    Healing hand blisters, no other rash.    Abdomen:   Abdomen soft, not tender and not distended, +incisional hernia, well-healed incision   Extremities:   Warm and well-perfused with L>R edema.   Neurological:  Alert and oriented to person, place and time.     Labs:  Reviewed in Epic    Imaging:    05/25/16 CT CAP:  -- Status post right nephrectomy.  -- Irregular soft tissue density foci with peripheral enhancement in the right nephrectomy bed and extending inferiorly along the psoas muscle, concerning for recurrent malignancy.  -- Aortocaval lymphadenopathy.    07/04/16 Bone  scan  -No evidence of metastatic osseous disease.    07/20/16 Echo:  ?? Normal left ventricular systolic function, ejection fraction > 55%  ?? Diastolic dysfunction - grade I (normal filling pressures)  ?? Normal right ventricular systolic function      09/24/16 CT CAP:  -- Status post right nephrectomy. There has been interval response to therapy, with decreasing size of recurrent tumor within the nephrectomy bed, and decreasing aortocaval nodal tissue.  -- Unchanged ultrasound morphology of liver compatible with history of cirrhosis.    12/03/16 CT CAP:  -Worsening metastatic disease in the right nephrectomy bed with enlarging aortocaval nodes/nodules and new perihepatic implant.    For reference pericaval nodes/nodules now measure up to 2 cm, previously 1.5 cm (2:93). There is new enhancing lesion in/along the right psoas musculature measuring up to 1.8 cm (2:99-106).     03/05/2017 CT CAP s/p 4C Nivo on Omnivore:  - Increase appearance of metastatic implants along the inferior right hepatic lobe. Increased size of nodularity in the right nephrectomy and increased pericaval node/nodule compatible with worsening metastatic disease.  --Increased size of pericaval nodes/nodules measure up to 2.4 cm (2:93), previously 2.0 cm  --Slightly increased size of nodularity in the right nephrectomy bed measuring 3.6 by 3.4 x 2.0 cm (2:90)  -Small-volume ascites.      06/24/2017 CT CAP  -Progressive disease with increasing nephrectomy bed soft tissue, adjacent retroperitoneal adenopathy, right psoas/paraspinal soft tissue implants, and posterior perihepatic implant. No evidence of metastatic disease in the chest.  -Small amount of nonspecific fluid/small collection adjacent to the ventral abdominal hernia sac on the right measuring 1.6 cm. ??This appears increased compared to 04/02/17 but similar to 03/05/2017.  ????-Mild circumferential bladder wall thickening which may be due to contraction versus cystitis. Recommend correlation with urinalysis.  -Cirrhotic liver morphology.    09/11/2017 CT CAP s/p 8 weeks Cabozantinib  1. Stable to slight interval decrease in necrotic metastatic implants in the right renal fossa and necrotic aortocaval adenopathy. Stable perihepatic implant.   **Centrally necrotic 2.4 x 2.1 cm mass abutting and likely invading the adjacent right psoas muscle. This is not significantly changed from prior examination which time it measured 2.5 x 2.3 cm on remeasurement.   **3.1 x 1.9 cm necrotic mass (1:37), previously previously 3.8 x 2.2 cm.   **Aortocaval adenopathy measures up to 2.6 cm in short axis, previously 2.8 cm (1:30). A more inferior aortocaval node measures 2.5 cm, previously 2.7 cm (1:44).   2. Small amount of fluid along the right lateral aspect of the small and large bowel containing ventral hernia, nonspecific. No evidence of obstruction at the hernia site.-- he has seen a general surgeon recently regarding possible repair  3. Cirrhotic liver with small caliber gastrohepatic and esophageal varices.  4. No evidence of thoracic metastatic disease.      Pathology:    06/29/14:  Diagnosis:  A: ??Liver, segment 5, core needle biopsy  - Cirrhosis, etiology uncertain (see Light Microscopy) ??    B: ??Kidney and adrenal, right, radical nephrectomy, adrenalectomy, and caval  thrombectomy   Tumor histologic type/subtype: ??clear cell carcinoma  Sarcomatoid features:?????????? not identified   Histologic grade: Grade 2 (of 4) Fuhrman classification??????????  Tumor size: 11.5 cm diameter   Tumor focality: unifocal     Extent of invasion: Extra-capsular invasion into perirenal adipose tissue: not identified  ???? ?? Gerota's fascia: not involved   ???? ?? Renal sinus: not involved ??????????  ???? ?? Major veins (renal vein  or segmental branches, IVC): involved (macro and  micro)  ???? ?? Ureter: not involved   ???? ?? Venous: ??involved   ???? ?? Lymphatic:??????????not involved   ???? ??   Histologic assessment of surgical margins:??????????  ???? ?? Peri-nephric adipose tissue margin (partial nephrectomy only): negative   ???? ?? Gerota's fascia: negative   ???? ?? Renal vein margin:??????????no adherent carcinoma identified on initial or  recuts of en face margin (B2) ??  ???? ?? Ureter margin: ??negative   Adrenal gland:?????????? negative   Lymph nodes:??????????none received     Pathologic findings in non-neoplastic kidney: focal global glomerulosclerosis   AJCC Stage (renal primary): ??pT3b (intrahepatic caval involvement) ?? pNx ?? pMx    C: ??Mass, retrocaval, excision   - Renal clear cell carcinoma, partially encapsulated, presumed confined by  Gerota's fascia, extending to uninked tissue edges    06/27/16:   Right nephrectomy bed, core biopsy and touch preparation:  - Diagnostic of malignancy.  - Morphology consistent with recurrence of patient's known clear cell renal cell carcinoma (Grade 2 of 4 Fuhrman Nuclear Grade).

## 2017-11-13 NOTE — Unmapped (Addendum)
You were seen in the [facility] On[facility] Clinic today. We will plan to:    1. Continue Cabozantinib 20mg  daily   2. RTC in 4 weeks with CT scan             [name], FNP-BC  GU Medical Oncology  [email or web address]        Contact information:    In the event of an emergency call 911    [facility]:  Phone: [phone]  Fax: [phone]    After hours/nights/weekends:  Hospital Operator: [phone]    Lab Results   Component Value Date    WBC 4.4 (L) 11/13/2017    HGB 13.1 (L) 11/13/2017    HCT 38.2 (L) 11/13/2017    PLT 178 11/13/2017       Lab Results   Component Value Date    NA 136 11/13/2017    K 4.0 11/13/2017    CL 109 (H) 11/13/2017    CO2 21.0 (L) 11/13/2017    BUN 13 11/13/2017    CREATININE 1.16 11/13/2017    CALCIUM 8.8 11/13/2017    MG 1.8 07/19/2014    PHOS 3.5 07/19/2014

## 2017-11-14 NOTE — Unmapped (Signed)
Patient reports the 20mg  is much more tolerable than the 40mg  tablets.   He's having much less side effects on this dosing    [facility] Refill Coordination Note    Specialty Medication(s) to be Shipped:   Hematology/Oncology: Cabometyx 20mg     Other medication(s) to be shipped: n/a     Sergio Horton, DOB: [birth year]  Phone: [phone] (home) [phone] (work)  Shipping Address: [address]  [name] [address]    All above HIPAA information was verified with patient.     Completed refill call assessment today to schedule patient's medication shipment from the [facility] [phone]).       Specialty medication(s) and dose(s) confirmed: Regimen is correct and unchanged.   Changes to medications: Sergio Horton reports no changes reported at this time.  Changes to insurance: No  Questions for the pharmacist: No    The patient will receive an FSI print out for each medication shipped and additional FDA Medication Guides as required.  Patient education from [place] or Robet Leu may also be included in the shipment.    DISEASE-SPECIFIC INFORMATION        patient didn't relaize he was running low said to send it out when i thought it was best. sending out based on last refill date in FSI    ADHERENCE     Medication Adherence    Patient reported X missed doses in the last month:  0  Specialty Medication:  CABOMETYX 20MG   Patient is on additional specialty medications:  No  Patient is on more than two specialty medications:  No  Any gaps in refill history greater than 2 weeks in the last 3 months:  no  Demonstrates understanding of importance of adherence:  yes  Informant:  patient  Adherence tools used:  directed education   Other adherence tool:  morning routine   Support network for adherence:  family member  Confirmed plan for next specialty medication refill:  delivery by pharmacy  Refills needed for supportive medications:  not needed          Refill Coordination    Has the Patients' Contact Information Changed:  No  Is the Shipping Address Different:  No         SHIPPING     Shipping address confirmed in FSI.     Delivery Scheduled: Yes, Expected medication delivery date: 11/19/17 tuesday via UPS or courier.     Sergio Horton   [facility] Specialty Technician

## 2017-11-17 MED FILL — CABOMETYX 20MG TAB/20MG/TABS: CABOMETYX 20MG TAB/20MG/TABS | 30 days supply | Qty: 30 | Fill #3

## 2017-12-03 ENCOUNTER — Ambulatory Visit: Admit: 2017-12-03 | Discharge: 2017-12-03 | Payer: MEDICARE

## 2017-12-03 DIAGNOSIS — E039 Hypothyroidism, unspecified: Secondary | ICD-10-CM

## 2017-12-03 DIAGNOSIS — C651 Malignant neoplasm of right renal pelvis: Principal | ICD-10-CM

## 2017-12-05 NOTE — Unmapped (Signed)
Pt reports having 25 tablets at home (takes one daily).  Rescheduled refill call for 5/29.

## 2017-12-11 ENCOUNTER — Ambulatory Visit: Admit: 2017-12-11 | Discharge: 2017-12-11 | Payer: MEDICARE | Attending: Family | Primary: Family

## 2017-12-11 ENCOUNTER — Other Ambulatory Visit: Admit: 2017-12-11 | Discharge: 2017-12-11 | Payer: MEDICARE

## 2017-12-11 DIAGNOSIS — E039 Hypothyroidism, unspecified: Principal | ICD-10-CM

## 2017-12-11 DIAGNOSIS — C779 Secondary and unspecified malignant neoplasm of lymph node, unspecified: Principal | ICD-10-CM

## 2017-12-11 DIAGNOSIS — C649 Malignant neoplasm of unspecified kidney, except renal pelvis: Secondary | ICD-10-CM

## 2017-12-11 LAB — COMPREHENSIVE METABOLIC PANEL
ALBUMIN: 3.2 g/dL — ABNORMAL LOW (ref 3.5–5.0)
ALKALINE PHOSPHATASE: 76 U/L (ref 38–126)
ALT (SGPT): 41 U/L (ref 19–72)
ANION GAP: 9 mmol/L (ref 9–15)
AST (SGOT): 61 U/L — ABNORMAL HIGH (ref 19–55)
BILIRUBIN TOTAL: 0.6 mg/dL (ref 0.0–1.2)
BLOOD UREA NITROGEN: 8 mg/dL (ref 7–21)
BUN / CREAT RATIO: 8
CALCIUM: 8.7 mg/dL (ref 8.5–10.2)
CHLORIDE: 107 mmol/L (ref 98–107)
CO2: 22 mmol/L (ref 22.0–30.0)
EGFR MDRD NON AF AMER: >=60 mL/min/{1.73_m2} (ref >=60)
GLUCOSE RANDOM: 85 mg/dL (ref 65–179)
POTASSIUM: 4.6 mmol/L (ref 3.5–5.0)
PROTEIN TOTAL: 6 g/dL — ABNORMAL LOW (ref 6.5–8.3)
SODIUM: 138 mmol/L (ref 135–145)

## 2017-12-11 LAB — CBC W/ AUTO DIFF
BASOPHILS ABSOLUTE COUNT: 0 10*9/L (ref 0.0–0.1)
BASOPHILS RELATIVE PERCENT: 0.3 %
EOSINOPHILS RELATIVE PERCENT: 3.4 %
HEMATOCRIT: 38.3 % — ABNORMAL LOW (ref 41.0–53.0)
HEMOGLOBIN: 12.8 g/dL — ABNORMAL LOW (ref 13.5–17.5)
LARGE UNSTAINED CELLS: 3 % (ref 0–4)
LYMPHOCYTES ABSOLUTE COUNT: 1.3 10*9/L — ABNORMAL LOW (ref 1.5–5.0)
LYMPHOCYTES RELATIVE PERCENT: 30.3 %
MEAN CORPUSCULAR HEMOGLOBIN CONC: 33.6 g/dL (ref 31.0–37.0)
MEAN CORPUSCULAR HEMOGLOBIN: 33.7 pg (ref 26.0–34.0)
MEAN CORPUSCULAR VOLUME: 100.5 fL — ABNORMAL HIGH (ref 80.0–100.0)
MEAN PLATELET VOLUME: 7.7 fL (ref 7.0–10.0)
MONOCYTES RELATIVE PERCENT: 8.4 %
NEUTROPHILS ABSOLUTE COUNT: 2.4 10*9/L (ref 2.0–7.5)
NEUTROPHILS RELATIVE PERCENT: 54.4 %
PLATELET COUNT: 170 10*9/L (ref 150–440)
RED BLOOD CELL COUNT: 3.81 10*12/L — ABNORMAL LOW (ref 4.50–5.90)

## 2017-12-11 LAB — CO2: Carbon dioxide:SCnc:Pt:Ser/Plas:Qn:: 22

## 2017-12-11 LAB — MAGNESIUM: Magnesium:MCnc:Pt:Ser/Plas:Qn:: 1.9

## 2017-12-11 LAB — MACROCYTES

## 2017-12-11 MED ORDER — VITAMIN B COMPLEX-VITAMIN C-FOLIC ACID 0.8 MG TABLET
ORAL_TABLET | 12 refills | 0 days | Status: CP
Start: 2017-12-11 — End: ?

## 2017-12-11 MED ORDER — UREA 40 % TOPICAL CREAM
1 refills | 0 days | Status: CP
Start: 2017-12-11 — End: 2018-05-28

## 2017-12-11 MED ORDER — UREA 40 % LOTION
Freq: Every day | TOPICAL | 2 refills | 0 days | Status: CP
Start: 2017-12-11 — End: 2018-01-08

## 2017-12-11 NOTE — Unmapped (Signed)
0840:  Labs drawn and sent for analysis.  Care provided by  Will Bonnet

## 2017-12-11 NOTE — Unmapped (Signed)
Clinical Pharmacist Practitioner: [facility] Oncology Clinic    Oral Chemotherapy Follow-Up  Assessment and recommendations:    1. Cabozantinib monitoring and side effect management- Sergio Horton is tolerating the lower dose of cabozantinib 20 mg, but has experienced some blisters on hands and diarrhea (noted below).   - Continue cabozantinib 20 mg daily     2. Hypertension- BP today in clinic was 138/73. Home BP readings WNL.   - Continue amlodipine 10 mg daily  - Continue losartan 25 mg daily  - Monitor BP at home     3. 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 Diarrhea- A few weeks ago, he was experiencing 4-5 loose stools a day, but this has subsided without intervention, and he is now back to his baseline of having ~ 2 bowel movements a day.   - Instructed patient to contact the clinic if diarrhea returns.     5. Monitoring previous AST elevation- LFTs from 5/22 mostly WNL (AST slightly elevated)  - With history of cirrhosis, will continue to monitor    Follow- up: Returning to clinic 01/08/18 with [name].    ______________________________________________________________________    Oral Chemotherapy: Cabozantinib 40 mg daily (empirically dose adjusted due to h/o cirrhosis), held 08/15/17-08/28/17 for hypertension and PPE, and restarted at 20 mg daily on 08/28/17  Start date: 07/08/17    HPI: 67 y.o.m with alcoholic cirrhosis, HTN, and h/o pT3a ccRCC s/p right nephrectomy 06/2014, with local recurrence, was started on sunitinib 07/27/16-12/03/16, transitioined to treatment via OMNIVORE trial (01/07/2017-06/24/2017) with POD. On cabozantinib since 07/08/17, with plan to continue at reduced dose of 20 mg daily.    Interm History:  Sergio Horton reports feeling well today in clinic. He continues to take only 1 tablet of cabozantinib each morning on an empty stomach. His BP in clinic today is controlled, and he checks it at home regularly (which typically runs 130s/70s). A few weeks ago he starting experiencing 4-5 episodes of diarrhea a day, but this resolved on it's own. Lastly, he has some blisters on his hands that are improving. They don't cause much pain and he is still able to perform his daily functions. 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 We reviewed that consistently holding a paintbrush and wearing gloves (potentially causing friction) are likely precipitating the blisters.      Adherence: Takes cabozantinib 20 mg (1 tablet) each morning on an empty stomach.    Toxicities: PPE, diarrhea    Drug-Drug Interactions: none    Medications:  Current Outpatient Medications   Medication Sig Dispense Refill   ??? amLODIPine (NORVASC) 10 MG tablet Take 1 tablet (10 mg total) by mouth daily. 30 tablet 6   ??? B complex-vitamin C-folic acid (RENA-VITE) 0.8 mg Tab Take 1 tablet daily 30 tablet 12   ??? cabozantinib 20 mg Tab Take 20 mg by mouth daily at 0600. 30 tablet 3   ??? diclofenac sodium (VOLTAREN) 1 % gel Apply 2 g topically every six (6) hours as needed for arthritis. 100 g 0   ??? levothyroxine (SYNTHROID) 100 MCG tablet Take 1 tablet (100 mcg total) by mouth daily. 30 tablet 11   ??? losartan (COZAAR) 25 MG tablet Take 1 tablet (  25 mg total) by mouth daily. 90 tablet 3   ??? omeprazole 20 mg tablet Take 20 mg by mouth daily.     ??? urea (CARMOL) 40 % cream Apply twice daily to sore areas on hands and feet 198.6 g 1   ??? urea 40 % Lotn Apply 1 application topically daily. 236.6 Bottle 2     No current facility-administered medications for this visit.      I spent 20 minutes with SergioApgar in direct patient care.    Rozanna Boer, PharmD, MSPS  PGY2 Hematology/Oncology Pharmacy Resident  Pager: [phone]

## 2017-12-11 NOTE — Unmapped (Addendum)
Plan:  -- Continue with Cabozantinib 20mg  daily  -- Continue urea cream to hands and feet  -- RTC in 1 month  -- We will plan to repeat imaging in 8 weeks    Lab Results   Component Value Date    WBC 4.3 (L) 12/11/2017    HGB 12.8 (L) 12/11/2017    HCT 38.3 (L) 12/11/2017    PLT 170 12/11/2017       Lab Results   Component Value Date    NA 138 12/11/2017    K 4.6 12/11/2017    CL 107 12/11/2017    CO2 22.0 12/11/2017    BUN 8 12/11/2017    CREATININE 1.03 12/11/2017    CALCIUM 8.7 12/11/2017    MG 1.9 12/11/2017    PHOS 3.5 07/19/2014       CT CAP 12/03/2017  -- Implant in the right psoas muscle measures 2.2 x 1.5 cm (1:49), previously 2.4 x 2.1 cm  -- Implant posterior to the right hepatic lobe measures 3.6 x 2.2 cm (1:42), previously 4.3 x 2.2 cm  -- Aortocaval lymph node measures 3.5 x 2.1 cm (1:44), previously 4.4 x 2.5 cm  -- More inferior necrotic appearing retrocaval lymph node measures 2.1 x 1.4 cm (1:55), previously 2.2 x 1.8 cm.

## 2017-12-11 NOTE — Unmapped (Signed)
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 He has progressed through Sunitnib, Nivolumab + Ipilimumab via OMNIVORE clinical trial, and is now on treatment via Cabozantinb 20mg .       1. ccRCC: On treatment via Cabozantinib 40mg  (started at 40 mg given liver disease)  ?? Previously treated with Sunitnib (07/27/2016-12/03/2016), Nivolumab + Ipilimumab + Nivo via OMNIVORE clinical trial (01/21/2017-05/29/2017)  ?? Cabozantinib 40mg  (07/08/2017-present). Dose reduced 20mg  given constellation of SE's including PPE, fatigue, HTN, mild transaminitis. HE is tolerating 20mg  well at this time and overall continues to have a good response  ?? Most recent imaging from 12/03/2017 reviewed today reporting overall decrease in all disease sites.     2. Cirrhosis: Has intermittent baseline leukopenia and thrombocytopia, although reports no episodes of decompensated cirrhosis and currently in normal range. Stable and well compensated.   ?? Grade 1 transaminitis--resolved  ?? Avoids tylenol    3. HTN: better controled. Currently on amlodipine 10mg   only. Home reads 170-180/90's  ?? Discontinued lisinopril previously due to cough   ?? Will start losartan 25mg  daily for better BP control     4. Asymmetric LE edema: No DVT per recent doppler    5. Elevated TSH: On synthroid, CTM    6. Ventral hernia: easily reduces. Advised wearing abdominal binder if is becomes bothersome. He is met with  Dr. Carlynn Purl on 07/11/2017, who advised holding off on surgery until he is in a stable place in regards to renal cancer response to treatment, as he will have to hold drug before and after surgery.     7. Elevated sCr: stable; CTM     8. GERD: prilosec    9. Health Maintenance: Has received Flu and pneumovax this year     10. PPE -- returned 2 weeks ago, but resolved today  ?? Continue urea cream          Plan:  -- Continue Cabozantinib 20mg  daily   -- CTM LFT's and TSH closely   -- CTM BP closely at home  -- He will continue to use urea cream to hands and feet  -- RTC in 4 weeks with follow up visit and labs    Greater than 50% of today's 60 minute office visit was dedicated to face-to-face counseling on test results, diagnosis, treatment and prognosis-- [name], FNP-BC    History of Present Illness:    Sergio Horton is a 67 y.o. male with h/o right nephrectomy and caval thrombectomy for ccRCC who is seen in follow-up for locally recurrent RCC, on treatment via cabozantinib.     He originally presented in 2015 with a large right renal mass. He underwent nephrectomy in 06/2014 and pathology showed a 11.5cm grade 2 ccRCC with invasion into the perirenal adipose tissue (pT3a). He also had a retrocaval mass resected that also was ccRCC. Post-op course was complicated by respiratory failure requiring ECMO. He was in the hospital for a month after his surgery. Routine imaging demonstrated likely local recurrence and biopsy confirmed RCC. He initiated sunitinib on 07/27/16 and dose reduced 08/22/16 due to neutropenia, restarted 09/03/16, the dose reduced again for HFS. CT on 12/03/16 showed POD and sunitinib was stopped.  He transitioned to treatment via OMNIVORE trial (01/07/2017-06/24/2017), that  utilized single agent nivolumab, until POD was noted, then he received 2 cycles of Ipi + Nivo. Repeat imaging reported POD.     Given POD he started Cabozantinib on 07/03/2017.     Interval History:  The patient is here in follow up on cabozantinib. He continues to do well, with resolution of all SEs. BP is stable, he denies diarrhea, rash, PPE. Parastomal hernia is still the biggest complaint.       Past Medical History:  Past Medical History:   Diagnosis Date   ??? Cirrhosis of liver (CMS-HCC)    ??? Hypertension    ??? Hypothyroidism    ??? Renal cancer (CMS-HCC)    ??? Thrombocytopathia (CMS-HCC)    Alcoholic Cirrhosis - denies any ascites or h/o encephalopathy  HTN   No history of autoimmune disorder.    Medications:    Current Outpatient Medications on File Prior to Visit   Medication Sig Dispense Refill   ??? amLODIPine (NORVASC) 10 MG tablet Take 1 tablet (10 mg total) by mouth daily. 30 tablet 6   ??? diclofenac sodium (VOLTAREN) 1 % gel Apply 2 g topically every six (6) hours as needed for arthritis. 100 g 0   ??? levothyroxine (SYNTHROID) 100 MCG tablet Take 1 tablet (100 mcg total) by mouth daily. 30 tablet 11   ??? losartan (COZAAR) 25 MG tablet Take 1 tablet (25 mg total) by mouth daily. 90 tablet 3   ??? omeprazole 20 mg tablet Take 20 mg by mouth daily.       No current facility-administered medications on file prior to visit.      Allergies:  No Known Allergies    Social History:  Lives in [place] with wife. He has a h/o cirrhosis from alcohol use but does not currently use alcohol. No tobacco use. No other drugs. Works as a Financial risk analyst.    Review of Systems: A 10 system review was completed and was negative except per HPI.    Physical Exam:  Vitals:    12/11/17 0937   BP: 138/73   Pulse: 55   Resp: 16   Temp: 36.6 ??C (97.9 ??F)     ECOG PS 1    General:   No acute distress, alert and interactive   Eyes:    Extra ocular muscles intact.  Sclera not icteric.   ENT:  Oropharynx clear.   Neck:  Supple   Lymph Nodes:  No cervical adenopathy   Cardiovascular:  RRR without murmurs, rubs, gallops   Lungs:  Clear to auscultation bilaterally, without wheezes/crackles/rhonchi.   Skin:    Healing hand blisters, no other rash.    Abdomen:   Abdomen soft, not tender and not distended, +incisional hernia, well-healed incision   Extremities:   Warm and well-perfused with L>R edema.   Neurological:  Alert and oriented to person, place and time.     Labs:  Reviewed in Epic    Imaging:    05/25/16 CT CAP:  -- Status post right nephrectomy. -- Irregular soft tissue density foci with peripheral enhancement in the right nephrectomy bed and extending inferiorly along the psoas muscle, concerning for recurrent malignancy.  -- Aortocaval lymphadenopathy.    07/04/16 Bone scan  -No evidence of metastatic osseous disease.    07/20/16 Echo:  ?? Normal left ventricular systolic function, ejection fraction > 55%  ?? Diastolic dysfunction - grade I (normal filling pressures)  ?? Normal right ventricular systolic function      09/24/16 CT CAP:  --  Status post right nephrectomy. There has been interval response to therapy, with decreasing size of recurrent tumor within the nephrectomy bed, and decreasing aortocaval nodal tissue.  -- Unchanged ultrasound morphology of liver compatible with history of cirrhosis.    12/03/16 CT CAP:  -Worsening metastatic disease in the right nephrectomy bed with enlarging aortocaval nodes/nodules and new perihepatic implant.    For reference pericaval nodes/nodules now measure up to 2 cm, previously 1.5 cm (2:93). There is new enhancing lesion in/along the right psoas musculature measuring up to 1.8 cm (2:99-106).     03/05/2017 CT CAP s/p 4C Nivo on Omnivore:  - Increase appearance of metastatic implants along the inferior right hepatic lobe. Increased size of nodularity in the right nephrectomy and increased pericaval node/nodule compatible with worsening metastatic disease.  --Increased size of pericaval nodes/nodules measure up to 2.4 cm (2:93), previously 2.0 cm  --Slightly increased size of nodularity in the right nephrectomy bed measuring 3.6 by 3.4 x 2.0 cm (2:90)  -Small-volume ascites.      06/24/2017 CT CAP  -Progressive disease with increasing nephrectomy bed soft tissue, adjacent retroperitoneal adenopathy, right psoas/paraspinal soft tissue implants, and posterior perihepatic implant. No evidence of metastatic disease in the chest.  -Small amount of nonspecific fluid/small collection adjacent to the ventral abdominal hernia sac on the right measuring 1.6 cm. ??This appears increased compared to 04/02/17 but similar to 03/05/2017.  ????-Mild circumferential bladder wall thickening which may be due to contraction versus cystitis. Recommend correlation with urinalysis.  -Cirrhotic liver morphology.    09/11/2017 CT CAP s/p 8 weeks Cabozantinib  1. Stable to slight interval decrease in necrotic metastatic implants in the right renal fossa and necrotic aortocaval adenopathy. Stable perihepatic implant.   **Centrally necrotic 2.4 x 2.1 cm mass abutting and likely invading the adjacent right psoas muscle. This is not significantly changed from prior examination which time it measured 2.5 x 2.3 cm on remeasurement.   **3.1 x 1.9 cm necrotic mass (1:37), previously previously 3.8 x 2.2 cm.   **Aortocaval adenopathy measures up to 2.6 cm in short axis, previously 2.8 cm (1:30). A more inferior aortocaval node measures 2.5 cm, previously 2.7 cm (1:44).   2. Small amount of fluid along the right lateral aspect of the small and large bowel containing ventral hernia, nonspecific. No evidence of obstruction at the hernia site.-- he has seen a general surgeon recently regarding possible repair  3. Cirrhotic liver with small caliber gastrohepatic and esophageal varices.  4. No evidence of thoracic metastatic disease.    12/03/2017 CT CAP on Cabozantinib  1. Stable to slight interval decrease in necrotic metastatic implants in the right renal fossa and necrotic aortocaval adenopathy. Stable perihepatic implant.  2. Small amount of fluid along the right lateral aspect of the small and large bowel containing ventral hernia, nonspecific. No evidence of obstruction at the hernia site.  3. Cirrhotic liver with small caliber gastrohepatic and esophageal varices.  4. No evidence of thoracic metastatic disease.      Pathology:    06/29/14:  Diagnosis:  A: ??Liver, segment 5, core needle biopsy  - Cirrhosis, etiology uncertain (see Light Microscopy) ??    B: ??Kidney and adrenal, right, radical nephrectomy, adrenalectomy, and caval  thrombectomy   Tumor histologic type/subtype: ??clear cell carcinoma  Sarcomatoid features:?????????? not identified   Histologic grade: Grade 2 (of 4) Fuhrman classification??????????  Tumor size: 11.5 cm diameter   Tumor focality: unifocal     Extent of invasion:??????????  ???? ??  Extra-capsular invasion into perirenal adipose tissue: not identified  ???? ?? Gerota's fascia: not involved   ???? ?? Renal sinus: not involved ??????????  ???? ?? Major veins (renal vein or segmental branches, IVC): involved (macro and  micro)  ???? ?? Ureter: not involved   ???? ?? Venous: ??involved   ???? ?? Lymphatic:??????????not involved   ???? ??   Histologic assessment of surgical margins:??????????  ???? ?? Peri-nephric adipose tissue margin (partial nephrectomy only): negative   ???? ?? Gerota's fascia: negative   ???? ?? Renal vein margin:??????????no adherent carcinoma identified on initial or  recuts of en face margin (B2) ??  ???? ?? Ureter margin: ??negative   Adrenal gland:?????????? negative   Lymph nodes:??????????none received     Pathologic findings in non-neoplastic kidney: focal global glomerulosclerosis   AJCC Stage (renal primary): ??pT3b (intrahepatic caval involvement) ?? pNx ?? pMx    C: ??Mass, retrocaval, excision   - Renal clear cell carcinoma, partially encapsulated, presumed confined by  Gerota's fascia, extending to uninked tissue edges    06/27/16:   Right nephrectomy bed, core biopsy and touch preparation:  - Diagnostic of malignancy.  - Morphology consistent with recurrence of patient's known clear cell renal cell carcinoma (Grade 2 of 4 Fuhrman Nuclear Grade).

## 2017-12-20 MED ORDER — CABOZANTINIB 20 MG TABLET: 20 mg | tablet | Freq: Every day | 3 refills | 0 days | Status: AC

## 2017-12-20 MED ORDER — CABOZANTINIB 20 MG TABLET
ORAL_TABLET | Freq: Every day | ORAL | 3 refills | 0.00000 days | Status: CP
Start: 2017-12-20 — End: 2017-12-20

## 2017-12-20 MED ORDER — CABOZANTINIB 20 MG TABLET: each | 3 refills | 0 days

## 2017-12-20 NOTE — Unmapped (Signed)
[facility] Refill Coordination Note  Specialty Medication(s): CABOMETYX 20MG   Additional Medications shipped: NO    Sergio Horton, DOB: [birth year]  Phone: [phone] (home) [phone] (work), Alternate phone contact: N/A  Phone or address changes today?: No  All above HIPAA information was verified with patient.  Shipping Address: [address]  [name] [address]   Insurance changes? No    Completed refill call assessment today to schedule patient's medication shipment from the [facility] [phone]).      Confirmed the medication and dosage are correct and have not changed: Yes, regimen is correct and unchanged.    Confirmed patient started or stopped the following medications in the past month:  No, there are no changes reported at this time.    Are you tolerating your medication?:  Sergio Horton reports tolerating the medication.    ADHERENCE    Did you miss any doses in the past 4 weeks? No missed doses reported.    FINANCIAL/SHIPPING    Delivery Scheduled: Yes, Expected medication delivery date: 12/24/17.  However, Rx request for refills was sent to the provider as there are none remaining.     The patient will receive an FSI print out for each medication shipped and additional FDA Medication Guides as required.  Patient education from [place] or Sergio Horton may also be included in the shipment    [name] did not have any additional questions at this time.    Delivery address validated in FSI scheduling system: Yes, address listed in FSI is correct.    We will follow up with patient monthly for standard refill processing and delivery.      Thank you,  Sergio Horton   [facility] Specialty Pharmacist

## 2017-12-21 MED FILL — CABOMETYX 20MG TAB/20MG/TABS: CABOMETYX 20MG TAB/20MG/TABS | 30 days supply | Qty: 30 | Fill #0

## 2018-01-08 ENCOUNTER — Ambulatory Visit: Admit: 2018-01-08 | Discharge: 2018-01-08 | Payer: MEDICARE | Attending: Family | Primary: Family

## 2018-01-08 ENCOUNTER — Other Ambulatory Visit: Admit: 2018-01-08 | Discharge: 2018-01-08 | Payer: MEDICARE

## 2018-01-08 DIAGNOSIS — C779 Secondary and unspecified malignant neoplasm of lymph node, unspecified: Principal | ICD-10-CM

## 2018-01-08 DIAGNOSIS — K7460 Unspecified cirrhosis of liver: Secondary | ICD-10-CM

## 2018-01-08 DIAGNOSIS — C649 Malignant neoplasm of unspecified kidney, except renal pelvis: Secondary | ICD-10-CM

## 2018-01-08 DIAGNOSIS — D691 Qualitative platelet defects: Secondary | ICD-10-CM

## 2018-01-08 LAB — COMPREHENSIVE METABOLIC PANEL
ALBUMIN: 3.3 g/dL — ABNORMAL LOW (ref 3.5–5.0)
ALKALINE PHOSPHATASE: 72 U/L (ref 38–126)
ALT (SGPT): 48 U/L (ref 19–72)
ANION GAP: 7 mmol/L — ABNORMAL LOW (ref 9–15)
AST (SGOT): 53 U/L (ref 19–55)
BLOOD UREA NITROGEN: 11 mg/dL (ref 7–21)
BUN / CREAT RATIO: 10
CALCIUM: 9 mg/dL (ref 8.5–10.2)
CHLORIDE: 106 mmol/L (ref 98–107)
CO2: 22 mmol/L (ref 22.0–30.0)
CREATININE: 1.07 mg/dL (ref 0.70–1.30)
EGFR MDRD AF AMER: >=60 mL/min/{1.73_m2} (ref >=60)
EGFR MDRD NON AF AMER: >=60 mL/min/{1.73_m2} (ref >=60)
GLUCOSE RANDOM: 121 mg/dL (ref 65–179)
POTASSIUM: 4.4 mmol/L (ref 3.5–5.0)
PROTEIN TOTAL: 6.2 g/dL — ABNORMAL LOW (ref 6.5–8.3)
SODIUM: 135 mmol/L (ref 135–145)

## 2018-01-08 LAB — CBC W/ AUTO DIFF
BASOPHILS ABSOLUTE COUNT: 0 10*9/L (ref 0.0–0.1)
EOSINOPHILS ABSOLUTE COUNT: 0.1 10*9/L (ref 0.0–0.4)
EOSINOPHILS RELATIVE PERCENT: 3.4 %
HEMATOCRIT: 38.8 % — ABNORMAL LOW (ref 41.0–53.0)
HEMOGLOBIN: 12.8 g/dL — ABNORMAL LOW (ref 13.5–17.5)
LYMPHOCYTES ABSOLUTE COUNT: 1.5 10*9/L (ref 1.5–5.0)
LYMPHOCYTES RELATIVE PERCENT: 38.2 %
MEAN CORPUSCULAR HEMOGLOBIN CONC: 33.1 g/dL (ref 31.0–37.0)
MEAN CORPUSCULAR HEMOGLOBIN: 33.5 pg (ref 26.0–34.0)
MEAN CORPUSCULAR VOLUME: 101.3 fL — ABNORMAL HIGH (ref 80.0–100.0)
MEAN PLATELET VOLUME: 7.8 fL (ref 7.0–10.0)
MONOCYTES ABSOLUTE COUNT: 0.3 10*9/L (ref 0.2–0.8)
MONOCYTES RELATIVE PERCENT: 7.6 %
NEUTROPHILS ABSOLUTE COUNT: 1.8 10*9/L — ABNORMAL LOW (ref 2.0–7.5)
NEUTROPHILS RELATIVE PERCENT: 46.3 %
PLATELET COUNT: 179 10*9/L (ref 150–440)
RED BLOOD CELL COUNT: 3.83 10*12/L — ABNORMAL LOW (ref 4.50–5.90)
RED CELL DISTRIBUTION WIDTH: 15.4 % — ABNORMAL HIGH (ref 12.0–15.0)
WBC ADJUSTED: 3.8 10*9/L — ABNORMAL LOW (ref 4.5–11.0)

## 2018-01-08 LAB — LACTATE DEHYDROGENASE: Lactate dehydrogenase:CCnc:Pt:Ser/Plas:Qn:: 631 — ABNORMAL HIGH

## 2018-01-08 LAB — MAGNESIUM: Magnesium:MCnc:Pt:Ser/Plas:Qn:: 1.9

## 2018-01-08 LAB — POTASSIUM: Potassium:SCnc:Pt:Ser/Plas:Qn:: 4.4

## 2018-01-08 LAB — EOSINOPHILS RELATIVE PERCENT: Lab: 3.4

## 2018-01-08 MED ORDER — UREA 40 % LOTION
Freq: Every day | TOPICAL | 2 refills | 0 days | Status: CP
Start: 2018-01-08 — End: 2018-04-02

## 2018-01-08 NOTE — Unmapped (Signed)
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 He has progressed through Sunitnib, Nivolumab + Ipilimumab via OMNIVORE clinical trial, and is now on treatment via Cabozantinb 20mg .       1. ccRCC: On treatment via Cabozantinib 40mg  (started at 40 mg given liver disease)  ?? Previously treated with Sunitnib (07/27/2016-12/03/2016), Nivolumab + Ipilimumab + Nivo via OMNIVORE clinical trial (01/21/2017-05/29/2017)  ?? Cabozantinib 40mg  (07/08/2017-present). Dose reduced 20mg  given constellation of SE's including PPE, fatigue, HTN, mild transaminitis. HE is tolerating 20mg  well at this time and overall continues to have a good response  ?? Most recent imaging from 12/03/2017 reported overall decrease in all disease sites.     2. Cirrhosis: Has intermittent baseline leukopenia and thrombocytopia, although reports no episodes of decompensated cirrhosis and currently in normal range. Stable and well compensated.   ?? Grade 1 transaminitis--resolved  ?? Avoids tylenol    3. HTN: better controled. Currently on amlodipine 10mg  and losartan 25mg .   ?? Home readings 130-140's/80's  ?? Discontinued lisinopril previously due to cough     4. Asymmetric LE edema: No DVT per recent doppler    5. Elevated TSH: On synthroid, dose changed to 112 mcg on 12/27/2017 by PCP    6. Ventral hernia: easily reduces. Advised wearing abdominal binder if is becomes bothersome. He is met with  Dr. Carlynn Purl on 07/11/2017, who advised holding off on surgery until he is in a stable place in regards to renal cancer response to treatment, as he will have to hold drug before and after surgery.     7. Elevated sCr: stable; CTM     8. GERD: prilosec    9. Health Maintenance: Has received Flu and pneumovax this year 10. PPE --mild to left heel and bilateral fingers, manageable.  ?? Continue urea cream      Plan:  -- Continue Cabozantinib 20mg  daily   -- CTM LFT's and TSH closely   -- CTM BP closely at home  -- He will continue to use urea cream to hands and feet  -- RTC in 4 weeks with follow up visit and labs  -- Repeat imaging in 6-8 weeks     Greater than 50% of today's 60 minute office visit was dedicated to face-to-face counseling on test results, diagnosis, treatment and prognosis-- [name], FNP-BC    History of Present Illness:    Sergio Horton is a 67 y.o. male with h/o right nephrectomy and caval thrombectomy for ccRCC who is seen in follow-up for locally recurrent RCC, on treatment via cabozantinib.     He originally presented in 2015 with a large right renal mass. He underwent nephrectomy in 06/2014 and pathology showed a 11.5cm grade 2 ccRCC with invasion into the perirenal adipose tissue (pT3a). He also had a retrocaval mass resected that also was ccRCC. Post-op course was complicated by respiratory failure requiring ECMO. He was in the hospital for a month after his surgery. Routine imaging demonstrated likely local recurrence and biopsy confirmed RCC. He initiated sunitinib on 07/27/16 and dose reduced 08/22/16 due to neutropenia, restarted 09/03/16, the dose reduced again for HFS. CT on 12/03/16 showed POD and sunitinib was stopped.  He transitioned to treatment via OMNIVORE trial (01/07/2017-06/24/2017), that utilized single  agent nivolumab, until POD was noted, then he received 2 cycles of Ipi + Nivo. Repeat imaging reported POD.     Given POD he started Cabozantinib on 07/03/2017.     Interval History:  The patient is here in follow up on cabozantinib. He continues to do well, with resolution of all SEs. BP is stable, he denies diarrhea, rash, PPE. Parastomal hernia is still the biggest complaint.       Past Medical History:  Past Medical History:   Diagnosis Date   ??? Cirrhosis of liver (CMS-HCC)    ??? Hypertension    ??? Hypothyroidism    ??? Renal cancer (CMS-HCC)    ??? Thrombocytopathia (CMS-HCC)    Alcoholic Cirrhosis - denies any ascites or h/o encephalopathy  HTN   No history of autoimmune disorder.    Medications:    Current Outpatient Medications on File Prior to Visit   Medication Sig Dispense Refill   ??? amLODIPine (NORVASC) 10 MG tablet Take 1 tablet (10 mg total) by mouth daily. 30 tablet 6   ??? B complex-vitamin C-folic acid (RENA-VITE) 0.8 mg Tab Take 1 tablet daily 30 tablet 12   ??? cabozantinib 20 mg Tab Take 20 mg by mouth daily at 0600. 30 tablet 3   ??? diclofenac sodium (VOLTAREN) 1 % gel Apply 2 g topically every six (6) hours as needed for arthritis. 100 g 0   ??? losartan (COZAAR) 25 MG tablet Take 1 tablet (25 mg total) by mouth daily. 90 tablet 3   ??? omeprazole 20 mg tablet Take 20 mg by mouth daily.     ??? urea (CARMOL) 40 % cream Apply twice daily to sore areas on hands and feet 198.6 g 1   ??? levothyroxine (SYNTHROID, LEVOTHROID) 112 MCG tablet TK 1 T PO QAM ON AN EMPTY STOMACH  1     No current facility-administered medications on file prior to visit.      Allergies:  No Known Allergies    Social History:  Lives in [place] with wife. He has a h/o cirrhosis from alcohol use but does not currently use alcohol. No tobacco use. No other drugs. Works as a Financial risk analyst.    Review of Systems: A 10 system review was completed and was negative except per HPI.    Physical Exam:  Vitals:    01/08/18 0928   BP: 145/71   Pulse: 54   Resp: 16   Temp: 36.4 ??C (97.5 ??F)   SpO2: 97%     ECOG PS 1    General:   No acute distress, alert and interactive   Eyes:    Extra ocular muscles intact.  Sclera not icteric.   ENT:  Oropharynx clear.   Neck:  Supple   Lymph Nodes:  No cervical adenopathy   Cardiovascular:  RRR without murmurs, rubs, gallops   Lungs:  Clear to auscultation bilaterally, without wheezes/crackles/rhonchi.   Skin:    Healing hand blisters, no other rash.    Abdomen:   Abdomen soft, not tender and not distended, +incisional hernia, well-healed incision   Extremities:   Warm and well-perfused with L>R edema.   Neurological:  Alert and oriented to person, place and time.     Labs:  Reviewed in Epic    Imaging:    05/25/16 CT CAP:  -- Status post right nephrectomy.  -- Irregular soft tissue density foci with peripheral enhancement in the right nephrectomy bed and extending inferiorly along the psoas muscle, concerning for  recurrent malignancy.  -- Aortocaval lymphadenopathy.    07/04/16 Bone scan  -No evidence of metastatic osseous disease.    07/20/16 Echo:  ?? Normal left ventricular systolic function, ejection fraction > 55%  ?? Diastolic dysfunction - grade I (normal filling pressures)  ?? Normal right ventricular systolic function      09/24/16 CT CAP:  -- Status post right nephrectomy. There has been interval response to therapy, with decreasing size of recurrent tumor within the nephrectomy bed, and decreasing aortocaval nodal tissue.  -- Unchanged ultrasound morphology of liver compatible with history of cirrhosis.    12/03/16 CT CAP:  -Worsening metastatic disease in the right nephrectomy bed with enlarging aortocaval nodes/nodules and new perihepatic implant.    For reference pericaval nodes/nodules now measure up to 2 cm, previously 1.5 cm (2:93). There is new enhancing lesion in/along the right psoas musculature measuring up to 1.8 cm (2:99-106).     03/05/2017 CT CAP s/p 4C Nivo on Omnivore:  - Increase appearance of metastatic implants along the inferior right hepatic lobe. Increased size of nodularity in the right nephrectomy and increased pericaval node/nodule compatible with worsening metastatic disease.  --Increased size of pericaval nodes/nodules measure up to 2.4 cm (2:93), previously 2.0 cm  --Slightly increased size of nodularity in the right nephrectomy bed measuring 3.6 by 3.4 x 2.0 cm (2:90)  -Small-volume ascites.      06/24/2017 CT CAP  -Progressive disease with increasing nephrectomy bed soft tissue, adjacent retroperitoneal adenopathy, right psoas/paraspinal soft tissue implants, and posterior perihepatic implant. No evidence of metastatic disease in the chest.  -Small amount of nonspecific fluid/small collection adjacent to the ventral abdominal hernia sac on the right measuring 1.6 cm. ??This appears increased compared to 04/02/17 but similar to 03/05/2017.  ????-Mild circumferential bladder wall thickening which may be due to contraction versus cystitis. Recommend correlation with urinalysis.  -Cirrhotic liver morphology.    09/11/2017 CT CAP s/p 8 weeks Cabozantinib  1. Stable to slight interval decrease in necrotic metastatic implants in the right renal fossa and necrotic aortocaval adenopathy. Stable perihepatic implant.   **Centrally necrotic 2.4 x 2.1 cm mass abutting and likely invading the adjacent right psoas muscle. This is not significantly changed from prior examination which time it measured 2.5 x 2.3 cm on remeasurement.   **3.1 x 1.9 cm necrotic mass (1:37), previously previously 3.8 x 2.2 cm.   **Aortocaval adenopathy measures up to 2.6 cm in short axis, previously 2.8 cm (1:30). A more inferior aortocaval node measures 2.5 cm, previously 2.7 cm (1:44).   2. Small amount of fluid along the right lateral aspect of the small and large bowel containing ventral hernia, nonspecific. No evidence of obstruction at the hernia site.-- he has seen a general surgeon recently regarding possible repair  3. Cirrhotic liver with small caliber gastrohepatic and esophageal varices.  4. No evidence of thoracic metastatic disease.    12/03/2017 CT CAP on Cabozantinib  1. Stable to slight interval decrease in necrotic metastatic implants in the right renal fossa and necrotic aortocaval adenopathy. Stable perihepatic implant.  2. Small amount of fluid along the right lateral aspect of the small and large bowel containing ventral hernia, nonspecific. No evidence of obstruction at the hernia site.  3. Cirrhotic liver with small caliber gastrohepatic and esophageal varices.  4. No evidence of thoracic metastatic disease.      Pathology:    06/29/14:  Diagnosis:  A: ??Liver, segment 5, core needle biopsy  - Cirrhosis, etiology uncertain (see  Light Microscopy) ??    B: ??Kidney and adrenal, right, radical nephrectomy, adrenalectomy, and caval  thrombectomy   Tumor histologic type/subtype: ??clear cell carcinoma  Sarcomatoid features:?????????? not identified   Histologic grade: Grade 2 (of 4) Fuhrman classification??????????  Tumor size: 11.5 cm diameter   Tumor focality: unifocal     Extent of invasion:??????????  ???? ?? Extra-capsular invasion into perirenal adipose tissue: not identified  ???? ?? Gerota's fascia: not involved   ???? ?? Renal sinus: not involved ??????????  ???? ?? Major veins (renal vein or segmental branches, IVC): involved (macro and  micro)  ???? ?? Ureter: not involved   ???? ?? Venous: ??involved   ???? ?? Lymphatic:??????????not involved   ???? ??   Histologic assessment of surgical margins:??????????  ???? ?? Peri-nephric adipose tissue margin (partial nephrectomy only): negative   ???? ?? Gerota's fascia: negative   ???? ?? Renal vein margin:??????????no adherent carcinoma identified on initial or  recuts of en face margin (B2) ??  ???? ?? Ureter margin: ??negative   Adrenal gland:?????????? negative   Lymph nodes:??????????none received     Pathologic findings in non-neoplastic kidney: focal global glomerulosclerosis   AJCC Stage (renal primary): ??pT3b (intrahepatic caval involvement) ?? pNx ?? pMx    C: ??Mass, retrocaval, excision   - Renal clear cell carcinoma, partially encapsulated, presumed confined by  Gerota's fascia, extending to uninked tissue edges    06/27/16:   Right nephrectomy bed, core biopsy and touch preparation:  - Diagnostic of malignancy.  - Morphology consistent with recurrence of patient's known clear cell renal cell carcinoma (Grade 2 of 4 Fuhrman Nuclear Grade).

## 2018-01-08 NOTE — Unmapped (Signed)
1610:  Labs drawn and sent for analysis.  Care provided by  Will Bonnet.

## 2018-01-08 NOTE — Unmapped (Addendum)
You were seen in the [facility] today. We will plan to:    1. RTC in 4 weeks with labs (July 15)  2. Imaging due in 6-8 weeks ( week of Aug 5th), follow up in clinic on Aug 7.  3. Call with any issues      [name], FNP-BC  [facility]  [email or web address]      Contact information:    In the event of an emergency call 911    [facility]:  Phone: [phone]  Fax: [phone]    After hours/nights/weekends:  Hospital Operator: [phone]

## 2018-01-16 NOTE — Unmapped (Signed)
[facility] Refill Coordination Note  Specialty Medication(s): Cabometyx 20mg       Sergio Horton, DOB: [birth year]  Phone: [phone] (home) [phone] (work), Alternate phone contact: N/A  Phone or address changes today?: No  All above HIPAA information was verified with patient.  Shipping Address: [address]  [place] [address]   Insurance changes? No    Completed refill call assessment today to schedule patient's medication shipment from the [facility] [phone]).      Confirmed the medication and dosage are correct and have not changed: Yes, regimen is correct and unchanged.    Confirmed patient started or stopped the following medications in the past month:  No, there are no changes reported at this time.    Are you tolerating your medication?:  [name] reports tolerating the medication.    ADHERENCE    Is this medicine transplant or covered by Medicare Part B? No.        Did you miss any doses in the past 4 weeks? No missed doses reported.    FINANCIAL/SHIPPING    Delivery Scheduled: Yes, Expected medication delivery date: 01/21/18     The patient will receive an FSI print out for each medication shipped and additional FDA Medication Guides as required.  Patient education from [place] or Robet Leu may also be included in the shipment.    Acie did not have any additional questions at this time.    Delivery address validated in FSI scheduling system: Yes, address listed in FSI is correct.    We will follow up with patient monthly for standard refill processing and delivery.      Thank you,  [employer]   [facility] Specialty Pharmacist

## 2018-01-20 MED FILL — CABOMETYX 20MG TAB/20MG/TABS: CABOMETYX 20MG TAB/20MG/TABS | 30 days supply | Qty: 30 | Fill #1

## 2018-02-03 ENCOUNTER — Other Ambulatory Visit: Admit: 2018-02-03 | Discharge: 2018-02-04 | Payer: MEDICARE

## 2018-02-03 ENCOUNTER — Ambulatory Visit: Admit: 2018-02-03 | Discharge: 2018-02-04 | Payer: MEDICARE | Attending: Family | Primary: Family

## 2018-02-03 DIAGNOSIS — C649 Malignant neoplasm of unspecified kidney, except renal pelvis: Secondary | ICD-10-CM

## 2018-02-03 DIAGNOSIS — C779 Secondary and unspecified malignant neoplasm of lymph node, unspecified: Principal | ICD-10-CM

## 2018-02-03 DIAGNOSIS — K7460 Unspecified cirrhosis of liver: Secondary | ICD-10-CM

## 2018-02-03 DIAGNOSIS — D691 Qualitative platelet defects: Secondary | ICD-10-CM

## 2018-02-03 LAB — COMPREHENSIVE METABOLIC PANEL
ALBUMIN: 3.2 g/dL — ABNORMAL LOW (ref 3.5–5.0)
ALKALINE PHOSPHATASE: 73 U/L (ref 38–126)
ALT (SGPT): 43 U/L (ref 19–72)
ANION GAP: 6 mmol/L — ABNORMAL LOW (ref 9–15)
AST (SGOT): 49 U/L (ref 19–55)
BILIRUBIN TOTAL: 0.5 mg/dL (ref 0.0–1.2)
BLOOD UREA NITROGEN: 9 mg/dL (ref 7–21)
BUN / CREAT RATIO: 9
CALCIUM: 8.9 mg/dL (ref 8.5–10.2)
CHLORIDE: 105 mmol/L (ref 98–107)
CREATININE: 1.03 mg/dL (ref 0.70–1.30)
EGFR CKD-EPI AA MALE: 85 mL/min/{1.73_m2} (ref >=60)
EGFR CKD-EPI NON-AA MALE: 74 mL/min/{1.73_m2} (ref >=60)
GLUCOSE RANDOM: 101 mg/dL (ref 65–179)
POTASSIUM: 4.1 mmol/L (ref 3.5–5.0)
PROTEIN TOTAL: 6.1 g/dL — ABNORMAL LOW (ref 6.5–8.3)
SODIUM: 135 mmol/L (ref 135–145)

## 2018-02-03 LAB — CBC W/ AUTO DIFF
BASOPHILS RELATIVE PERCENT: 0.2 %
EOSINOPHILS ABSOLUTE COUNT: 0.1 10*9/L (ref 0.0–0.4)
EOSINOPHILS RELATIVE PERCENT: 3.5 %
HEMOGLOBIN: 12.8 g/dL — ABNORMAL LOW (ref 13.5–17.5)
LARGE UNSTAINED CELLS: 3 % (ref 0–4)
LYMPHOCYTES ABSOLUTE COUNT: 1.1 10*9/L — ABNORMAL LOW (ref 1.5–5.0)
LYMPHOCYTES RELATIVE PERCENT: 29.1 %
MEAN CORPUSCULAR HEMOGLOBIN CONC: 32.9 g/dL (ref 31.0–37.0)
MEAN CORPUSCULAR HEMOGLOBIN: 33.6 pg (ref 26.0–34.0)
MEAN CORPUSCULAR VOLUME: 102 fL — ABNORMAL HIGH (ref 80.0–100.0)
MEAN PLATELET VOLUME: 8 fL (ref 7.0–10.0)
MONOCYTES ABSOLUTE COUNT: 0.3 10*9/L (ref 0.2–0.8)
MONOCYTES RELATIVE PERCENT: 9 %
NEUTROPHILS ABSOLUTE COUNT: 2 10*9/L (ref 2.0–7.5)
NEUTROPHILS RELATIVE PERCENT: 55.4 %
PLATELET COUNT: 155 10*9/L (ref 150–440)
RED BLOOD CELL COUNT: 3.83 10*12/L — ABNORMAL LOW (ref 4.50–5.90)
RED CELL DISTRIBUTION WIDTH: 16.3 % — ABNORMAL HIGH (ref 12.0–15.0)
WBC ADJUSTED: 3.7 10*9/L — ABNORMAL LOW (ref 4.5–11.0)

## 2018-02-03 LAB — ALT (SGPT): Alanine aminotransferase:CCnc:Pt:Ser/Plas:Qn:: 43

## 2018-02-03 LAB — SMEAR REVIEW

## 2018-02-03 LAB — LACTATE DEHYDROGENASE: Lactate dehydrogenase:CCnc:Pt:Ser/Plas:Qn:: 645 — ABNORMAL HIGH

## 2018-02-03 LAB — ANISOCYTOSIS

## 2018-02-03 NOTE — Unmapped (Signed)
6045:  Labs drawn and sent for analysis.  Care provided by  Will Bonnet.

## 2018-02-03 NOTE — Unmapped (Signed)
Plan:  Continue cabozantinib  RTC as scheduled

## 2018-02-03 NOTE — Unmapped (Signed)
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 He has progressed through Sunitnib, Nivolumab + Ipilimumab via OMNIVORE clinical trial, and is now on treatment via Cabozantinb 20mg .       1. ccRCC: On treatment via Cabozantinib 40mg  (started at 40 mg given liver disease)  ?? Previously treated with Sunitnib (07/27/2016-12/03/2016), Nivolumab + Ipilimumab + Nivo via OMNIVORE clinical trial (01/21/2017-05/29/2017)  ?? Cabozantinib 40mg  (07/08/2017-present). Dose reduced 20mg  given constellation of SE's including PPE, fatigue, HTN, mild transaminitis. HE is tolerating 20mg  well at this time and overall continues to have a good response  ?? Most recent imaging from 12/03/2017 reported overall decrease in all disease sites. Imaging due again prior to next appt. Scheduled for 8/7    2. Cirrhosis: Has intermittent baseline leukopenia and thrombocytopia, although reports no episodes of decompensated cirrhosis and currently in normal range. Stable and well compensated.   ?? Avoids tylenol    3. HTN: better controled. Currently on amlodipine 10mg  and losartan 25mg .   ?? Home readings 130-140's/80's  ?? Discontinued lisinopril previously due to cough     4. Asymmetric LE edema: No DVT per recent doppler    5. Elevated TSH: On synthroid, dose changed to 112 mcg on 12/27/2017 by PCP    6. Ventral hernia: easily reduces. Advised wearing abdominal binder if is becomes bothersome. He is met with  Dr. Carlynn Purl on 07/11/2017, who advised holding off on surgery until he is in a stable place in regards to renal cancer response to treatment, as he will have to hold drug before and after surgery.     7. Elevated sCr: stable; CTM     8. GERD: prilosec    9. Health Maintenance: Has received Flu and pneumovax this year     10. PPE --mild to left heel and bilateral fingers, manageable.  ?? Continue urea cream      Plan:  -- Continue Cabozantinib 20mg  daily   -- CTM LFT's and TSH closely   -- CTM BP closely at home  -- He will continue to use urea cream to hands and feet  -- RTC in 4 weeks with follow up visit and labs  -- Imaging scheduled for 8/7        History of Present Illness:    Sergio Horton is a 67 y.o. male with h/o right nephrectomy and caval thrombectomy for ccRCC who is seen in follow-up for locally recurrent RCC, on treatment via cabozantinib.     He originally presented in 2015 with a large right renal mass. He underwent nephrectomy in 06/2014 and pathology showed a 11.5cm grade 2 ccRCC with invasion into the perirenal adipose tissue (pT3a). He also had a retrocaval mass resected that also was ccRCC. Post-op course was complicated by respiratory failure requiring ECMO. He was in the hospital for a month after his surgery. Routine imaging demonstrated likely local recurrence and biopsy confirmed RCC. He initiated sunitinib on 07/27/16 and dose reduced 08/22/16 due to neutropenia, restarted 09/03/16, the dose reduced again for HFS. CT on 12/03/16 showed POD and sunitinib was stopped.  He transitioned to treatment via OMNIVORE trial (01/07/2017-06/24/2017), that utilized single agent nivolumab, until POD was noted, then he received 2 cycles of Ipi + Nivo. Repeat imaging  reported POD.     Given POD he started Cabozantinib on 07/03/2017.     Interval History:  The patient is here in follow up on cabozantinib. He continues to do well. Diarrhea last week x 2 episodes, but this has resolved. BP stable, PPE to hands similar to previous appt.. No blisters, skin peeling in the finger creases. Using urea cream BID. No sores noted on feet. No mucositis. Appetite is mildly decreased, weight is down some from previous visit, but patient states he is trying to lose weight.    Past Medical History:  Past Medical History: Diagnosis Date   ??? Cirrhosis of liver (CMS-HCC)    ??? Hypertension    ??? Hypothyroidism    ??? Renal cancer (CMS-HCC)    ??? Thrombocytopathia (CMS-HCC)    Alcoholic Cirrhosis - denies any ascites or h/o encephalopathy  HTN   No history of autoimmune disorder.    Medications:    Current Outpatient Medications on File Prior to Visit   Medication Sig Dispense Refill   ??? amLODIPine (NORVASC) 10 MG tablet Take 1 tablet (10 mg total) by mouth daily. 30 tablet 6   ??? B complex-vitamin C-folic acid (RENA-VITE) 0.8 mg Tab Take 1 tablet daily 30 tablet 12   ??? cabozantinib 20 mg Tab Take 20 mg by mouth daily at 0600. 30 tablet 3   ??? diclofenac sodium (VOLTAREN) 1 % gel Apply 2 g topically every six (6) hours as needed for arthritis. 100 g 0   ??? levothyroxine (SYNTHROID, LEVOTHROID) 112 MCG tablet TK 1 T PO QAM ON AN EMPTY STOMACH  1   ??? losartan (COZAAR) 25 MG tablet Take 1 tablet (25 mg total) by mouth daily. 90 tablet 3   ??? omeprazole 20 mg tablet Take 20 mg by mouth daily.     ??? urea (CARMOL) 40 % cream Apply twice daily to sore areas on hands and feet 198.6 g 1   ??? urea 40 % Lotn Apply 1 application topically daily. 236.6 Bottle 2     No current facility-administered medications on file prior to visit.      Allergies:  No Known Allergies    Social History:  Lives in [place] with wife. He has a h/o cirrhosis from alcohol use but does not currently use alcohol. No tobacco use. No other drugs. Works as a Financial risk analyst.    Review of Systems: A 10 system review was completed and was negative except per HPI.    Physical Exam:  Vitals:    02/03/18 1032   BP: 138/68   Pulse: 52   Resp: 18   Temp: 36.4 ??C (97.5 ??F)   SpO2: 98%     ECOG PS 1    General:   No acute distress, alert and interactive   Eyes:    Extra ocular muscles intact.  Sclera not icteric.   ENT:  Oropharynx clear.   Neck:  Supple   Lymph Nodes:  No cervical adenopathy   Cardiovascular:  RRR without murmurs, rubs, gallops   Lungs:  Clear to auscultation bilaterally, without wheezes/crackles/rhonchi.   Skin:    Healing hand blisters, no other rash.    Abdomen:   Abdomen soft, not tender and not distended, +incisional hernia, well-healed incision   Extremities:   Warm and well-perfused with L>R edema.   Neurological:  Alert and oriented to person, place and time.     Labs:  Reviewed in Epic    Imaging:    05/25/16 CT  CAP:  -- Status post right nephrectomy.  -- Irregular soft tissue density foci with peripheral enhancement in the right nephrectomy bed and extending inferiorly along the psoas muscle, concerning for recurrent malignancy.  -- Aortocaval lymphadenopathy.    07/04/16 Bone scan  -No evidence of metastatic osseous disease.    07/20/16 Echo:  ?? Normal left ventricular systolic function, ejection fraction > 55%  ?? Diastolic dysfunction - grade I (normal filling pressures)  ?? Normal right ventricular systolic function      09/24/16 CT CAP:  -- Status post right nephrectomy. There has been interval response to therapy, with decreasing size of recurrent tumor within the nephrectomy bed, and decreasing aortocaval nodal tissue.  -- Unchanged ultrasound morphology of liver compatible with history of cirrhosis.    12/03/16 CT CAP:  -Worsening metastatic disease in the right nephrectomy bed with enlarging aortocaval nodes/nodules and new perihepatic implant.    For reference pericaval nodes/nodules now measure up to 2 cm, previously 1.5 cm (2:93). There is new enhancing lesion in/along the right psoas musculature measuring up to 1.8 cm (2:99-106).     03/05/2017 CT CAP s/p 4C Nivo on Omnivore:  - Increase appearance of metastatic implants along the inferior right hepatic lobe. Increased size of nodularity in the right nephrectomy and increased pericaval node/nodule compatible with worsening metastatic disease.  --Increased size of pericaval nodes/nodules measure up to 2.4 cm (2:93), previously 2.0 cm  --Slightly increased size of nodularity in the right nephrectomy bed measuring 3.6 by 3.4 x 2.0 cm (2:90)  -Small-volume ascites.      06/24/2017 CT CAP  -Progressive disease with increasing nephrectomy bed soft tissue, adjacent retroperitoneal adenopathy, right psoas/paraspinal soft tissue implants, and posterior perihepatic implant. No evidence of metastatic disease in the chest.  -Small amount of nonspecific fluid/small collection adjacent to the ventral abdominal hernia sac on the right measuring 1.6 cm. ??This appears increased compared to 04/02/17 but similar to 03/05/2017.  ????-Mild circumferential bladder wall thickening which may be due to contraction versus cystitis. Recommend correlation with urinalysis.  -Cirrhotic liver morphology.    09/11/2017 CT CAP s/p 8 weeks Cabozantinib  1. Stable to slight interval decrease in necrotic metastatic implants in the right renal fossa and necrotic aortocaval adenopathy. Stable perihepatic implant.   **Centrally necrotic 2.4 x 2.1 cm mass abutting and likely invading the adjacent right psoas muscle. This is not significantly changed from prior examination which time it measured 2.5 x 2.3 cm on remeasurement.   **3.1 x 1.9 cm necrotic mass (1:37), previously previously 3.8 x 2.2 cm.   **Aortocaval adenopathy measures up to 2.6 cm in short axis, previously 2.8 cm (1:30). A more inferior aortocaval node measures 2.5 cm, previously 2.7 cm (1:44).   2. Small amount of fluid along the right lateral aspect of the small and large bowel containing ventral hernia, nonspecific. No evidence of obstruction at the hernia site.-- he has seen a general surgeon recently regarding possible repair  3. Cirrhotic liver with small caliber gastrohepatic and esophageal varices.  4. No evidence of thoracic metastatic disease.    12/03/2017 CT CAP on Cabozantinib  1. Stable to slight interval decrease in necrotic metastatic implants in the right renal fossa and necrotic aortocaval adenopathy. Stable perihepatic implant.  2. Small amount of fluid along the right lateral aspect of the small and large bowel containing ventral hernia, nonspecific. No evidence of obstruction at the hernia site.  3. Cirrhotic liver with small caliber gastrohepatic and esophageal varices.  4. No  evidence of thoracic metastatic disease.      Pathology:    06/29/14:  Diagnosis:  A: ??Liver, segment 5, core needle biopsy  - Cirrhosis, etiology uncertain (see Light Microscopy) ??    B: ??Kidney and adrenal, right, radical nephrectomy, adrenalectomy, and caval  thrombectomy   Tumor histologic type/subtype: ??clear cell carcinoma  Sarcomatoid features:?????????? not identified   Histologic grade: Grade 2 (of 4) Fuhrman classification??????????  Tumor size: 11.5 cm diameter   Tumor focality: unifocal     Extent of invasion:??????????  ???? ?? Extra-capsular invasion into perirenal adipose tissue: not identified  ???? ?? Gerota's fascia: not involved   ???? ?? Renal sinus: not involved ??????????  ???? ?? Major veins (renal vein or segmental branches, IVC): involved (macro and  micro)  ???? ?? Ureter: not involved   ???? ?? Venous: ??involved   ???? ?? Lymphatic:??????????not involved   ???? ??   Histologic assessment of surgical margins:??????????  ???? ?? Peri-nephric adipose tissue margin (partial nephrectomy only): negative   ???? ?? Gerota's fascia: negative   ???? ?? Renal vein margin:??????????no adherent carcinoma identified on initial or  recuts of en face margin (B2) ??  ???? ?? Ureter margin: ??negative   Adrenal gland:?????????? negative   Lymph nodes:??????????none received     Pathologic findings in non-neoplastic kidney: focal global glomerulosclerosis   AJCC Stage (renal primary): ??pT3b (intrahepatic caval involvement) ?? pNx ?? pMx    C: ??Mass, retrocaval, excision   - Renal clear cell carcinoma, partially encapsulated, presumed confined by  Gerota's fascia, extending to uninked tissue edges    06/27/16:   Right nephrectomy bed, core biopsy and touch preparation:  - Diagnostic of malignancy.  - Morphology consistent with recurrence of patient's known clear cell renal cell carcinoma (Grade 2 of 4 Fuhrman Nuclear Grade).

## 2018-02-12 NOTE — Unmapped (Signed)
[facility] Refill Coordination Note  Specialty Medication(s): Cabometyx  Additional Medications shipped: n/a    Sergio Horton, DOB: [birth year]  Phone: [phone] (home) [phone] (work), Alternate phone contact: N/A  Phone or address changes today?: No  All above HIPAA information was verified with patient.  Shipping Address: [address]  [name] [address]   Insurance changes? No    Completed refill call assessment today to schedule patient's medication shipment from the [facility] [phone]).      Confirmed the medication and dosage are correct and have not changed: Yes, regimen is correct and unchanged.    Confirmed patient started or stopped the following medications in the past month:  No, there are no changes reported at this time.    Are you tolerating your medication?:  Maximiano reports side effects of manageble dry skin .    ADHERENCE    Is this medicine transplant or covered by Medicare Part B? No.        Did you miss any doses in the past 4 weeks? No missed doses reported.    FINANCIAL/SHIPPING    Delivery Scheduled: Yes, Expected medication delivery date: 01/20/18     The patient will receive an FSI print out for each medication shipped and additional FDA Medication Guides as required.  Patient education from [place] or Robet Leu may also be included in the shipment.    [place]ain did not have any additional questions at this time.    Delivery address validated in FSI scheduling system: Yes, address listed in FSI is correct.    We will follow up with patient monthly for standard refill processing and delivery.      Thank you,  Genevieve Arbaugh Vangie Bicker   [facility] Specialty Pharmacist

## 2018-02-19 MED FILL — CABOMETYX 20MG TAB/20MG/TABS: CABOMETYX 20MG TAB/20MG/TABS | 30 days supply | Qty: 30 | Fill #2

## 2018-02-26 ENCOUNTER — Ambulatory Visit: Admit: 2018-02-26 | Discharge: 2018-02-26 | Payer: MEDICARE | Attending: Family | Primary: Family

## 2018-02-26 ENCOUNTER — Other Ambulatory Visit: Admit: 2018-02-26 | Discharge: 2018-02-26 | Payer: MEDICARE

## 2018-02-26 ENCOUNTER — Ambulatory Visit: Admit: 2018-02-26 | Discharge: 2018-02-26 | Payer: MEDICARE

## 2018-02-26 DIAGNOSIS — C779 Secondary and unspecified malignant neoplasm of lymph node, unspecified: Principal | ICD-10-CM

## 2018-02-26 DIAGNOSIS — C649 Malignant neoplasm of unspecified kidney, except renal pelvis: Secondary | ICD-10-CM

## 2018-02-26 LAB — CBC W/ AUTO DIFF
BASOPHILS ABSOLUTE COUNT: 0 10*9/L (ref 0.0–0.1)
BASOPHILS RELATIVE PERCENT: 0.4 %
EOSINOPHILS ABSOLUTE COUNT: 0.2 10*9/L (ref 0.0–0.4)
EOSINOPHILS RELATIVE PERCENT: 3.8 %
HEMATOCRIT: 41.6 % (ref 41.0–53.0)
HEMOGLOBIN: 13.8 g/dL (ref 13.5–17.5)
LARGE UNSTAINED CELLS: 6 % — ABNORMAL HIGH (ref 0–4)
LYMPHOCYTES ABSOLUTE COUNT: 1.6 10*9/L (ref 1.5–5.0)
LYMPHOCYTES RELATIVE PERCENT: 37.4 %
MEAN CORPUSCULAR HEMOGLOBIN CONC: 33.2 g/dL (ref 31.0–37.0)
MEAN CORPUSCULAR HEMOGLOBIN: 33.4 pg (ref 26.0–34.0)
MEAN CORPUSCULAR VOLUME: 100.6 fL — ABNORMAL HIGH (ref 80.0–100.0)
MEAN PLATELET VOLUME: 7.7 fL (ref 7.0–10.0)
MONOCYTES RELATIVE PERCENT: 8.1 %
NEUTROPHILS ABSOLUTE COUNT: 1.9 10*9/L — ABNORMAL LOW (ref 2.0–7.5)
NEUTROPHILS RELATIVE PERCENT: 44.6 %
RED BLOOD CELL COUNT: 4.13 10*12/L — ABNORMAL LOW (ref 4.50–5.90)
RED CELL DISTRIBUTION WIDTH: 15.3 % — ABNORMAL HIGH (ref 12.0–15.0)
WBC ADJUSTED: 4.2 10*9/L — ABNORMAL LOW (ref 4.5–11.0)

## 2018-02-26 LAB — PROTEIN TOTAL: Protein:MCnc:Pt:Ser/Plas:Qn:: 0

## 2018-02-26 LAB — COMPREHENSIVE METABOLIC PANEL
ANION GAP: 7 mmol/L — ABNORMAL LOW (ref 9–15)
BUN / CREAT RATIO: 9
CALCIUM: 9.3 mg/dL (ref 8.5–10.2)
CHLORIDE: 100 mmol/L (ref 98–107)
CREATININE: 1 mg/dL (ref 0.70–1.30)
EGFR CKD-EPI AA MALE: 88 mL/min/{1.73_m2} (ref >=60)
EGFR CKD-EPI NON-AA MALE: 76 mL/min/{1.73_m2} (ref >=60)
GLUCOSE RANDOM: 79 mg/dL (ref 65–179)
SODIUM: 132 mmol/L — ABNORMAL LOW (ref 135–145)

## 2018-02-26 LAB — EOSINOPHILS ABSOLUTE COUNT: Lab: 0.2

## 2018-02-26 NOTE — Unmapped (Addendum)
Plan:  RTC in 4 weeks with labs and in 8 weeks with CT scan

## 2018-02-26 NOTE — Unmapped (Signed)
1220:  Labs drawn and sent for analysis.  Care provided by  Niel Hummer, RN

## 2018-02-26 NOTE — Unmapped (Signed)
Genitourinary Oncology Clinic Follow-Up Note    Patient Name: Sergio Horton  Encounter Date: 02/26/2018    Referring Physician: Renford Dills, [place], [place] [facility])  Urologist: Gean Quint    Assessment/Plan:    67 y.o.M with alcoholic cirrhosis, HTN, and h/o pT3a ccRCC s/p 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 He has progressed through Sunitnib, Nivolumab + Ipilimumab via OMNIVORE clinical trial, and is now on treatment via Cabozantinb 20mg .       1. ccRCC: On treatment via Cabozantinib 40mg  (started at 40 mg given liver disease)  ?? Previously treated with Sunitnib (07/27/2016-12/03/2016), Nivolumab + Ipilimumab + Nivo via OMNIVORE clinical trial (01/21/2017-05/29/2017)  ?? Cabozantinib 40mg  (07/08/2017-present). Dose reduced 20mg  given constellation of SE's including PPE, fatigue, HTN, mild transaminitis. HE is tolerating 20mg  well at this time and overall continues to have a good response  ?? Imaging from this morning reviewed with overall disease stability, no new areas of concern.     2. Cirrhosis: Has intermittent baseline leukopenia and thrombocytopia, although reports no episodes of decompensated cirrhosis and currently in normal range. Stable and well compensated.   ?? Avoids tylenol    3. HTN: better controled. Currently on amlodipine 10mg  and losartan 25mg .   ?? Home readings 130-140's/80's  ?? Discontinued lisinopril previously due to cough     4. Asymmetric LE edema: No DVT per recent doppler    5. Elevated TSH: On synthroid, dose changed to 112 mcg on 12/27/2017 by PCP    6. Ventral hernia: easily reduces. Advised wearing abdominal binder if is becomes bothersome. He is met with  Dr. Carlynn Purl on 07/11/2017, who advised holding off on surgery until he is in a stable place in regards to renal cancer response to treatment, as he will have to hold drug before and after surgery.     7. Elevated sCr: stable; CTM     8. GERD: prilosec    9. Health Maintenance: Has received Flu and pneumovax this year     10. PPE --mild to left heel and bilateral fingers, manageable.  ?? Continue urea cream      Plan:  -- Continue Cabozantinib 20mg  daily   -- CTM LFT's and TSH closely   -- CTM BP closely at home  -- He will continue to use urea cream to hands and feet  -- RTC in 4 weeks with follow up visit and labs  -- Repeat imaging in 8 weeks    Greater than 50% of today's 30 minute office visit was dedicated to face-to-face counseling on test results, diagnosis, treatment and prognosis-- [name], FNP- BC    History of Present Illness:    Sergio Horton is a 67 y.o. male with h/o right nephrectomy and caval thrombectomy for ccRCC who is seen in follow-up for locally recurrent RCC, on treatment via cabozantinib.     He originally presented in 2015 with a large right renal mass. He underwent nephrectomy in 06/2014 and pathology showed a 11.5cm grade 2 ccRCC with invasion into the perirenal adipose tissue (pT3a). He also had a retrocaval mass resected that also was ccRCC. Post-op course was complicated by respiratory failure requiring ECMO. He was in the hospital for a month after his surgery. Routine imaging demonstrated likely local recurrence and biopsy confirmed RCC. He initiated sunitinib on 07/27/16 and dose reduced 08/22/16 due to neutropenia, restarted 09/03/16, the dose reduced again for HFS. CT on 12/03/16 showed POD and sunitinib was stopped.  He transitioned to treatment via OMNIVORE trial (01/07/2017-06/24/2017), that utilized  single agent nivolumab, until POD was noted, then he received 2 cycles of Ipi + Nivo. Repeat imaging reported POD.     Given POD he started Cabozantinib on 07/03/2017.     Interval History:  The patient is here in follow up on cabozantinib. He continues to do well. PPE has improved. No new areas. He denies any focal pain, no diarrhea. He continues to work. His appetite and weight are stable. He is planning on having his parastomal hernia repaired at the end of the year.     Past Medical History:  Past Medical History: Diagnosis Date   ??? Cirrhosis of liver (CMS-HCC)    ??? Hypertension    ??? Hypothyroidism    ??? Renal cancer (CMS-HCC)    ??? Thrombocytopathia (CMS-HCC)    Alcoholic Cirrhosis - denies any ascites or h/o encephalopathy  HTN   No history of autoimmune disorder.    Medications:    Current Outpatient Medications on File Prior to Visit   Medication Sig Dispense Refill   ??? amLODIPine (NORVASC) 10 MG tablet Take 1 tablet (10 mg total) by mouth daily. 30 tablet 6   ??? B complex-vitamin C-folic acid (RENA-VITE) 0.8 mg Tab Take 1 tablet daily 30 tablet 12   ??? cabozantinib 20 mg Tab Take 20 mg by mouth daily at 0600. 30 tablet 3   ??? diclofenac sodium (VOLTAREN) 1 % gel Apply 2 g topically every six (6) hours as needed for arthritis. 100 g 0   ??? levothyroxine (SYNTHROID, LEVOTHROID) 112 MCG tablet TK 1 T PO QAM ON AN EMPTY STOMACH  1   ??? losartan (COZAAR) 25 MG tablet Take 1 tablet (25 mg total) by mouth daily. 90 tablet 3   ??? omeprazole 20 mg tablet Take 20 mg by mouth daily.     ??? urea (CARMOL) 40 % cream Apply twice daily to sore areas on hands and feet 198.6 g 1   ??? urea 40 % Lotn Apply 1 application topically daily. 236.6 Bottle 2   ??? [DISCONTINUED] lisinopril (PRINIVIL,ZESTRIL) 10 MG tablet Take 1 tablet (10 mg total) by mouth daily. 90 tablet 3     Current Facility-Administered Medications on File Prior to Visit   Medication Dose Route Frequency Provider Last Rate Last Dose   ??? [COMPLETED] iohexol (OMNIPAQUE) 240 mg iodine/mL oral solution 50 mL  50 mL Oral Once Kennieth Francois, FNP   50 mL at 02/26/18 1000   ??? [COMPLETED] iohexol (OMNIPAQUE) 350 mg iodine/mL solution 100 mL  100 mL Intravenous Once in imaging Kennieth Francois, FNP   100 mL at 02/26/18 1037     Allergies:  No Known Allergies    Social History:  Lives in [place] with wife. He has a h/o cirrhosis from alcohol use but does not currently use alcohol. No tobacco use. No other drugs. Works as a Financial risk analyst.    Review of Systems: A 10 system review was completed and was negative except per HPI.    Physical Exam:  Vitals:    02/26/18 1236   BP: 151/78   Pulse: 52   Resp: 18   Temp: 36.9 ??C (98.4 ??F)   SpO2: 98%     ECOG PS 1    General:   No acute distress, alert and interactive   Eyes:    Extra ocular muscles intact.  Sclera not icteric.   ENT:  Oropharynx clear.   Neck:  Supple   Lymph Nodes:  No cervical adenopathy  Cardiovascular:  RRR without murmurs, rubs, gallops   Lungs:  Clear to auscultation bilaterally, without wheezes/crackles/rhonchi.   Skin:    Healing hand blisters, no other rash.    Abdomen:   Abdomen soft, not tender and not distended, +incisional hernia, well-healed incision   Extremities:   Warm and well-perfused with L>R edema.   Neurological:  Alert and oriented to person, place and time.     Labs:  Reviewed in Epic    Imaging:    05/25/16 CT CAP:  -- Status post right nephrectomy.  -- Irregular soft tissue density foci with peripheral enhancement in the right nephrectomy bed and extending inferiorly along the psoas muscle, concerning for recurrent malignancy.  -- Aortocaval lymphadenopathy.    07/04/16 Bone scan  -No evidence of metastatic osseous disease.    07/20/16 Echo:  ?? Normal left ventricular systolic function, ejection fraction > 55%  ?? Diastolic dysfunction - grade I (normal filling pressures)  ?? Normal right ventricular systolic function      09/24/16 CT CAP:  -- Status post right nephrectomy. There has been interval response to therapy, with decreasing size of recurrent tumor within the nephrectomy bed, and decreasing aortocaval nodal tissue.  -- Unchanged ultrasound morphology of liver compatible with history of cirrhosis.    12/03/16 CT CAP:  -Worsening metastatic disease in the right nephrectomy bed with enlarging aortocaval nodes/nodules and new perihepatic implant.    For reference pericaval nodes/nodules now measure up to 2 cm, previously 1.5 cm (2:93). There is new enhancing lesion in/along the right psoas musculature measuring up to 1.8 cm (2:99-106).     03/05/2017 CT CAP s/p 4C Nivo on Omnivore:  - Increase appearance of metastatic implants along the inferior right hepatic lobe. Increased size of nodularity in the right nephrectomy and increased pericaval node/nodule compatible with worsening metastatic disease.  --Increased size of pericaval nodes/nodules measure up to 2.4 cm (2:93), previously 2.0 cm  --Slightly increased size of nodularity in the right nephrectomy bed measuring 3.6 by 3.4 x 2.0 cm (2:90)  -Small-volume ascites.      06/24/2017 CT CAP  -Progressive disease with increasing nephrectomy bed soft tissue, adjacent retroperitoneal adenopathy, right psoas/paraspinal soft tissue implants, and posterior perihepatic implant. No evidence of metastatic disease in the chest.  -Small amount of nonspecific fluid/small collection adjacent to the ventral abdominal hernia sac on the right measuring 1.6 cm. ??This appears increased compared to 04/02/17 but similar to 03/05/2017.  ????-Mild circumferential bladder wall thickening which may be due to contraction versus cystitis. Recommend correlation with urinalysis.  -Cirrhotic liver morphology.    09/11/2017 CT CAP s/p 8 weeks Cabozantinib  1. Stable to slight interval decrease in necrotic metastatic implants in the right renal fossa and necrotic aortocaval adenopathy. Stable perihepatic implant.   **Centrally necrotic 2.4 x 2.1 cm mass abutting and likely invading the adjacent right psoas muscle. This is not significantly changed from prior examination which time it measured 2.5 x 2.3 cm on remeasurement.   **3.1 x 1.9 cm necrotic mass (1:37), previously previously 3.8 x 2.2 cm.   **Aortocaval adenopathy measures up to 2.6 cm in short axis, previously 2.8 cm (1:30). A more inferior aortocaval node measures 2.5 cm, previously 2.7 cm (1:44).   2. Small amount of fluid along the right lateral aspect of the small and large bowel containing ventral hernia, nonspecific. No evidence of obstruction at the hernia site.-- he has seen a general surgeon recently regarding possible repair  3. Cirrhotic liver with small  caliber gastrohepatic and esophageal varices.  4. No evidence of thoracic metastatic disease.    12/03/2017 CT CAP on Cabozantinib  1. Stable to slight interval decrease in necrotic metastatic implants in the right renal fossa and necrotic aortocaval adenopathy. Stable perihepatic implant.  2. Small amount of fluid along the right lateral aspect of the small and large bowel containing ventral hernia, nonspecific. No evidence of obstruction at the hernia site.  3. Cirrhotic liver with small caliber gastrohepatic and esophageal varices.  4. No evidence of thoracic metastatic disease.      Pathology:    06/29/14:  Diagnosis:  A: ??Liver, segment 5, core needle biopsy  - Cirrhosis, etiology uncertain (see Light Microscopy) ??    B: ??Kidney and adrenal, right, radical nephrectomy, adrenalectomy, and caval  thrombectomy   Tumor histologic type/subtype: ??clear cell carcinoma  Sarcomatoid features:?????????? not identified   Histologic grade: Grade 2 (of 4) Fuhrman classification??????????  Tumor size: 11.5 cm diameter   Tumor focality: unifocal     Extent of invasion:??????????  ???? ?? Extra-capsular invasion into perirenal adipose tissue: not identified  ???? ?? Gerota's fascia: not involved   ???? ?? Renal sinus: not involved ??????????  ???? ?? Major veins (renal vein or segmental branches, IVC): involved (macro and  micro)  ???? ?? Ureter: not involved   ???? ?? Venous: ??involved   ???? ?? Lymphatic:??????????not involved   ???? ??   Histologic assessment of surgical margins:??????????  ???? ?? Peri-nephric adipose tissue margin (partial nephrectomy only): negative   ???? ?? Gerota's fascia: negative   ???? ?? Renal vein margin:??????????no adherent carcinoma identified on initial or  recuts of en face margin (B2) ??  ???? ?? Ureter margin: ??negative   Adrenal gland:?????????? negative   Lymph nodes:??????????none received     Pathologic findings in non-neoplastic kidney: focal global glomerulosclerosis   AJCC Stage (renal primary): ??pT3b (intrahepatic caval involvement) ?? pNx ?? pMx    C: ??Mass, retrocaval, excision   - Renal clear cell carcinoma, partially encapsulated, presumed confined by  Gerota's fascia, extending to uninked tissue edges    06/27/16:   Right nephrectomy bed, core biopsy and touch preparation:  - Diagnostic of malignancy.  - Morphology consistent with recurrence of patient's known clear cell renal cell carcinoma (Grade 2 of 4 Fuhrman Nuclear Grade).

## 2018-03-20 NOTE — Unmapped (Signed)
[facility] Refill Coordination Note    Specialty Medication(s) to be Shipped: cabozantinib      Other medication(s) to be shipped:      Sergio Horton, DOB: April 27, 1951  Phone: [phone] (home) [phone] (work)  Shipping Address: [address]  [name] [address]    All above HIPAA information was verified with patient.     Completed refill call assessment today to schedule patient's medication shipment from the [facility] [phone]).       Specialty medication(s) and dose(s) confirmed: Regimen is correct and unchanged.   Changes to medications: Sergio Horton reports no changes reported at this time.  Changes to insurance: No  Questions for the pharmacist: No    The patient will receive a drug information handout for each medication shipped and additional FDA Medication Guides as required.      DISEASE/MEDICATION-SPECIFIC INFORMATION            ADHERENCE     Medication Adherence    Patient reported X missed doses in the last month:  0  Informant:  patient  Reliability of informant:  reliable  Adherence tools used:  directed education   Other adherence tool:  morning routine   Support network for adherence:  family member          Refill Coordination    Has the Patients' Contact Information Changed:  No  Is the Shipping Address Different:  No         MEDICARE PART B DOCUMENTATION         SHIPPING     Shipping address confirmed in Epic.     Delivery Scheduled: Yes, Expected medication delivery date: 03/26/18 via UPS or courier.     Sergio Horton [employer]   [facility] Specialty Technician

## 2018-03-26 MED ORDER — OMEPRAZOLE 20 MG CAPSULE,DELAYED RELEASE
ORAL_CAPSULE | 0 refills | 0 days | Status: CP
Start: 2018-03-26 — End: 2018-06-18

## 2018-03-26 MED FILL — CABOMETYX 20 MG TABLET: 30 days supply | Qty: 30 | Fill #0 | Status: AC

## 2018-03-26 MED FILL — CABOMETYX 20 MG TABLET: 30 days supply | Qty: 30 | Fill #0

## 2018-03-26 NOTE — Unmapped (Signed)
Please refill if appropriate.  THanks,  [employer]

## 2018-04-02 ENCOUNTER — Ambulatory Visit: Admit: 2018-04-02 | Discharge: 2018-04-03 | Payer: MEDICARE | Attending: Family | Primary: Family

## 2018-04-02 ENCOUNTER — Other Ambulatory Visit: Admit: 2018-04-02 | Discharge: 2018-04-03 | Payer: MEDICARE

## 2018-04-02 DIAGNOSIS — C649 Malignant neoplasm of unspecified kidney, except renal pelvis: Secondary | ICD-10-CM

## 2018-04-02 DIAGNOSIS — C779 Secondary and unspecified malignant neoplasm of lymph node, unspecified: Principal | ICD-10-CM

## 2018-04-02 LAB — CBC W/ AUTO DIFF
BASOPHILS ABSOLUTE COUNT: 0 10*9/L (ref 0.0–0.1)
BASOPHILS RELATIVE PERCENT: 0.2 %
EOSINOPHILS ABSOLUTE COUNT: 0.1 10*9/L (ref 0.0–0.4)
EOSINOPHILS RELATIVE PERCENT: 3.4 %
HEMATOCRIT: 37.6 % — ABNORMAL LOW (ref 41.0–53.0)
HEMOGLOBIN: 12.3 g/dL — ABNORMAL LOW (ref 13.5–17.5)
LYMPHOCYTES ABSOLUTE COUNT: 1.1 10*9/L — ABNORMAL LOW (ref 1.5–5.0)
LYMPHOCYTES RELATIVE PERCENT: 28.2 %
MEAN CORPUSCULAR HEMOGLOBIN CONC: 32.7 g/dL (ref 31.0–37.0)
MEAN CORPUSCULAR HEMOGLOBIN: 33.5 pg (ref 26.0–34.0)
MEAN CORPUSCULAR VOLUME: 102.5 fL — ABNORMAL HIGH (ref 80.0–100.0)
MEAN PLATELET VOLUME: 7.5 fL (ref 7.0–10.0)
MONOCYTES ABSOLUTE COUNT: 0.3 10*9/L (ref 0.2–0.8)
MONOCYTES RELATIVE PERCENT: 6.6 %
NEUTROPHILS ABSOLUTE COUNT: 2.3 10*9/L (ref 2.0–7.5)
PLATELET COUNT: 179 10*9/L (ref 150–440)
RED BLOOD CELL COUNT: 3.67 10*12/L — ABNORMAL LOW (ref 4.50–5.90)
RED CELL DISTRIBUTION WIDTH: 16.7 % — ABNORMAL HIGH (ref 12.0–15.0)
WBC ADJUSTED: 3.9 10*9/L — ABNORMAL LOW (ref 4.5–11.0)

## 2018-04-02 LAB — COMPREHENSIVE METABOLIC PANEL
ALBUMIN: 3.4 g/dL — ABNORMAL LOW (ref 3.5–5.0)
ALKALINE PHOSPHATASE: 67 U/L (ref 38–126)
ALT (SGPT): 37 U/L (ref 19–72)
ANION GAP: 7 mmol/L — ABNORMAL LOW (ref 9–15)
AST (SGOT): 45 U/L (ref 19–55)
BILIRUBIN TOTAL: 0.5 mg/dL (ref 0.0–1.2)
BLOOD UREA NITROGEN: 9 mg/dL (ref 7–21)
BUN / CREAT RATIO: 8
CALCIUM: 9.3 mg/dL (ref 8.5–10.2)
CHLORIDE: 110 mmol/L — ABNORMAL HIGH (ref 98–107)
CO2: 19 mmol/L — ABNORMAL LOW (ref 22.0–30.0)
CREATININE: 1.11 mg/dL (ref 0.70–1.30)
EGFR CKD-EPI AA MALE: 78 mL/min/{1.73_m2} (ref >=60)
EGFR CKD-EPI NON-AA MALE: 67 mL/min/{1.73_m2} (ref >=60)
GLUCOSE RANDOM: 84 mg/dL (ref 65–179)
POTASSIUM: 4.3 mmol/L (ref 3.5–5.0)
SODIUM: 136 mmol/L (ref 135–145)

## 2018-04-02 LAB — BILIRUBIN TOTAL: Bilirubin:MCnc:Pt:Ser/Plas:Qn:: 0.5

## 2018-04-02 LAB — SMEAR REVIEW

## 2018-04-02 LAB — MEAN CORPUSCULAR HEMOGLOBIN: Lab: 33.5

## 2018-04-02 MED ORDER — UREA 40 % LOTION
Freq: Every day | TOPICAL | 2 refills | 0.00000 days | Status: CP
Start: 2018-04-02 — End: 2018-04-30

## 2018-04-02 NOTE — Unmapped (Signed)
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 He has progressed through Sunitnib, Nivolumab + Ipilimumab via OMNIVORE clinical trial, and is now on treatment via Cabozantinb 20mg .       1. ccRCC: On treatment via Cabozantinib 40mg  (started at 40 mg given liver disease)  ?? Previously treated with Sunitnib (07/27/2016-12/03/2016), Nivolumab + Ipilimumab + Nivo via OMNIVORE clinical trial (01/21/2017-05/29/2017)  ?? Cabozantinib 40mg  (07/08/2017-present). Dose reduced 20mg  given constellation of SE's including PPE, fatigue, HTN, mild transaminitis. HE is tolerating 20mg  well at this time and overall continues to have a good response  ??  Previous imaging from 02/2018, with overall disease stability, no new areas of concern.     2. Cirrhosis: Has intermittent baseline leukopenia and thrombocytopia, although reports no episodes of decompensated cirrhosis and currently in normal range. Stable and well compensated.   ?? Avoids tylenol    3. HTN: better controled. Currently on amlodipine 10mg  and losartan 25mg .   ?? Home readings 130-140's/80's  ?? Discontinued lisinopril previously due to cough     4. Asymmetric LE edema: No DVT per recent doppler    5. Elevated TSH: On synthroid, dose changed to 112 mcg on 12/27/2017 by PCP    6. Ventral hernia: easily reduces. Advised wearing abdominal binder if is becomes bothersome. He is met with  Dr. Carlynn Purl and will follow up with her after imaging has been completed in October.     7. Elevated sCr: stable; CTM     8. GERD: prilosec    9. Health Maintenance: Influenza due    10. PPE --no blisters noted today   ?? Continue urea cream    11. Weight loss: He has lost 15lbs since Jan. He reports no appetite changes, and he has intentionally tried to lose weight in preparation for hernia surgery.       Plan:  -- Continue Cabozantinib 20mg  daily   -- CTM LFT's and TSH closely   -- CTM BP closely at home  -- He will continue to use urea cream to hands and feet  -- RTC in 4 weeks with follow up visit and labs  -- Repeat imaging in 4 weeks    Greater than 50% of today's 30 minute office visit was dedicated to face-to-face counseling on test results, diagnosis, treatment and prognosis-- [name], FNP- BC    History of Present Illness:    Sergio Horton is a 67 y.o. male with h/o right nephrectomy and caval thrombectomy for ccRCC who is seen in follow-up for locally recurrent RCC, on treatment via cabozantinib.     He originally presented in 2015 with a large right renal mass. He underwent nephrectomy in 06/2014 and pathology showed a 11.5cm grade 2 ccRCC with invasion into the perirenal adipose tissue (pT3a). He also had a retrocaval mass resected that also was ccRCC. Post-op course was complicated by respiratory failure requiring ECMO. He was in the hospital for a month after his surgery. Routine imaging demonstrated likely local recurrence and biopsy confirmed RCC. He initiated sunitinib on 07/27/16 and dose reduced 08/22/16 due to neutropenia, restarted 09/03/16, the dose reduced again for HFS. CT on 12/03/16 showed POD and sunitinib was stopped.  He transitioned to treatment via OMNIVORE trial (01/07/2017-06/24/2017), that  utilized single agent nivolumab, until POD was noted, then he received 2 cycles of Ipi + Nivo. Repeat imaging reported POD.     Given POD he started Cabozantinib on 07/03/2017.     Interval History:  The patient is here in follow up on cabozantinib. He continues to do well. PPE has improved. No new areas. He denies any focal pain, no diarrhea. He continues to work. His appetite and weight are stable. He is planning on having his parastomal hernia repaired at the end of the year.     Past Medical History:  Past Medical History: Diagnosis Date   ??? Cirrhosis of liver (CMS-HCC)    ??? Hypertension    ??? Hypothyroidism    ??? Renal cancer (CMS-HCC)    ??? Thrombocytopathia (CMS-HCC)    Alcoholic Cirrhosis - denies any ascites or h/o encephalopathy  HTN   No history of autoimmune disorder.    Medications:    Current Outpatient Medications on File Prior to Visit   Medication Sig Dispense Refill   ??? amLODIPine (NORVASC) 10 MG tablet Take 1 tablet (10 mg total) by mouth daily. 30 tablet 6   ??? B complex-vitamin C-folic acid (RENA-VITE) 0.8 mg Tab Take 1 tablet daily 30 tablet 12   ??? cabozantinib 20 mg Tab TAKE 1 TABLET BY MOUTH ONCE DAILY AT 6:00 AM 30 each 3   ??? diclofenac sodium (VOLTAREN) 1 % gel Apply 2 g topically every six (6) hours as needed for arthritis. 100 g 0   ??? levothyroxine (SYNTHROID, LEVOTHROID) 112 MCG tablet TK 1 T PO QAM ON AN EMPTY STOMACH  1   ??? losartan (COZAAR) 25 MG tablet Take 1 tablet (25 mg total) by mouth daily. 90 tablet 3   ??? omeprazole (PRILOSEC) 20 MG capsule TAKE 1 CAPSULE BY MOUTH ONCE DAILY 90 capsule 0   ??? omeprazole 20 mg tablet Take 20 mg by mouth daily.     ??? urea (CARMOL) 40 % cream Apply twice daily to sore areas on hands and feet 198.6 g 1   ??? [DISCONTINUED] lisinopril (PRINIVIL,ZESTRIL) 10 MG tablet Take 1 tablet (10 mg total) by mouth daily. 90 tablet 3     No current facility-administered medications on file prior to visit.      Allergies:  No Known Allergies    Social History:  Lives in [place] with wife. He has a h/o cirrhosis from alcohol use but does not currently use alcohol. No tobacco use. No other drugs. Works as a Financial risk analyst.    Review of Systems: A 10 system review was completed and was negative except per HPI.    Physical Exam:  Vitals:    04/02/18 1113   BP: 144/76   Pulse: 51   Resp: 16   Temp: 36.6 ??C (97.9 ??F)   SpO2: 99%     ECOG PS 1    General:   No acute distress, alert and interactive   Eyes:    Extra ocular muscles intact.  Sclera not icteric.   ENT:  Oropharynx clear.   Neck: Supple   Lymph Nodes:  No cervical adenopathy   Cardiovascular:  RRR without murmurs, rubs, gallops   Lungs:  Clear to auscultation bilaterally, without wheezes/crackles/rhonchi.   Skin:    Healing hand blisters, no other rash.    Abdomen:   Abdomen soft, not tender and not distended, +incisional hernia, well-healed incision   Extremities:   Warm and well-perfused with L>R edema.   Neurological:  Alert  and oriented to person, place and time.     Labs:  Reviewed in Epic    Imaging:    05/25/16 CT CAP:  -- Status post right nephrectomy.  -- Irregular soft tissue density foci with peripheral enhancement in the right nephrectomy bed and extending inferiorly along the psoas muscle, concerning for recurrent malignancy.  -- Aortocaval lymphadenopathy.    07/04/16 Bone scan  -No evidence of metastatic osseous disease.    07/20/16 Echo:  ?? Normal left ventricular systolic function, ejection fraction > 55%  ?? Diastolic dysfunction - grade I (normal filling pressures)  ?? Normal right ventricular systolic function      09/24/16 CT CAP:  -- Status post right nephrectomy. There has been interval response to therapy, with decreasing size of recurrent tumor within the nephrectomy bed, and decreasing aortocaval nodal tissue.  -- Unchanged ultrasound morphology of liver compatible with history of cirrhosis.    12/03/16 CT CAP:  -Worsening metastatic disease in the right nephrectomy bed with enlarging aortocaval nodes/nodules and new perihepatic implant.    For reference pericaval nodes/nodules now measure up to 2 cm, previously 1.5 cm (2:93). There is new enhancing lesion in/along the right psoas musculature measuring up to 1.8 cm (2:99-106).     03/05/2017 CT CAP s/p 4C Nivo on Omnivore:  - Increase appearance of metastatic implants along the inferior right hepatic lobe. Increased size of nodularity in the right nephrectomy and increased pericaval node/nodule compatible with worsening metastatic disease.  --Increased size of pericaval nodes/nodules measure up to 2.4 cm (2:93), previously 2.0 cm  --Slightly increased size of nodularity in the right nephrectomy bed measuring 3.6 by 3.4 x 2.0 cm (2:90)  -Small-volume ascites.      06/24/2017 CT CAP  -Progressive disease with increasing nephrectomy bed soft tissue, adjacent retroperitoneal adenopathy, right psoas/paraspinal soft tissue implants, and posterior perihepatic implant. No evidence of metastatic disease in the chest.  -Small amount of nonspecific fluid/small collection adjacent to the ventral abdominal hernia sac on the right measuring 1.6 cm. ??This appears increased compared to 04/02/17 but similar to 03/05/2017.  ????-Mild circumferential bladder wall thickening which may be due to contraction versus cystitis. Recommend correlation with urinalysis.  -Cirrhotic liver morphology.    09/11/2017 CT CAP s/p 8 weeks Cabozantinib  1. Stable to slight interval decrease in necrotic metastatic implants in the right renal fossa and necrotic aortocaval adenopathy. Stable perihepatic implant.   **Centrally necrotic 2.4 x 2.1 cm mass abutting and likely invading the adjacent right psoas muscle. This is not significantly changed from prior examination which time it measured 2.5 x 2.3 cm on remeasurement.   **3.1 x 1.9 cm necrotic mass (1:37), previously previously 3.8 x 2.2 cm.   **Aortocaval adenopathy measures up to 2.6 cm in short axis, previously 2.8 cm (1:30). A more inferior aortocaval node measures 2.5 cm, previously 2.7 cm (1:44).   2. Small amount of fluid along the right lateral aspect of the small and large bowel containing ventral hernia, nonspecific. No evidence of obstruction at the hernia site.-- he has seen a general surgeon recently regarding possible repair  3. Cirrhotic liver with small caliber gastrohepatic and esophageal varices.  4. No evidence of thoracic metastatic disease.    12/03/2017 CT CAP on Cabozantinib  1. Stable to slight interval decrease in necrotic metastatic implants in the right renal fossa and necrotic aortocaval adenopathy. Stable perihepatic implant.  2. Small amount of fluid along the right lateral aspect of the small and large bowel containing  ventral hernia, nonspecific. No evidence of obstruction at the hernia site.  3. Cirrhotic liver with small caliber gastrohepatic and esophageal varices.  4. No evidence of thoracic metastatic disease.      Pathology:    06/29/14:  Diagnosis:  A: ??Liver, segment 5, core needle biopsy  - Cirrhosis, etiology uncertain (see Light Microscopy) ??    B: ??Kidney and adrenal, right, radical nephrectomy, adrenalectomy, and caval  thrombectomy   Tumor histologic type/subtype: ??clear cell carcinoma  Sarcomatoid features:?????????? not identified   Histologic grade: Grade 2 (of 4) Fuhrman classification??????????  Tumor size: 11.5 cm diameter   Tumor focality: unifocal     Extent of invasion:??????????  ???? ?? Extra-capsular invasion into perirenal adipose tissue: not identified  ???? ?? Gerota's fascia: not involved   ???? ?? Renal sinus: not involved ??????????  ???? ?? Major veins (renal vein or segmental branches, IVC): involved (macro and  micro)  ???? ?? Ureter: not involved   ???? ?? Venous: ??involved   ???? ?? Lymphatic:??????????not involved   ???? ??   Histologic assessment of surgical margins:??????????  ???? ?? Peri-nephric adipose tissue margin (partial nephrectomy only): negative   ???? ?? Gerota's fascia: negative   ???? ?? Renal vein margin:??????????no adherent carcinoma identified on initial or  recuts of en face margin (B2) ??  ???? ?? Ureter margin: ??negative   Adrenal gland:?????????? negative   Lymph nodes:??????????none received     Pathologic findings in non-neoplastic kidney: focal global glomerulosclerosis   AJCC Stage (renal primary): ??pT3b (intrahepatic caval involvement) ?? pNx ?? pMx    C: ??Mass, retrocaval, excision   - Renal clear cell carcinoma, partially encapsulated, presumed confined by  Gerota's fascia, extending to uninked tissue edges    06/27/16:   Right nephrectomy bed, core biopsy and touch preparation:  - Diagnostic of malignancy.  - Morphology consistent with recurrence of patient's known clear cell renal cell carcinoma (Grade 2 of 4 Fuhrman Nuclear Grade).

## 2018-04-02 NOTE — Unmapped (Signed)
Venipuncture LAC by Adolph Pollack RN, labs drawn and sent.  To next appt.

## 2018-04-02 NOTE — Unmapped (Addendum)
You were seen in the [facility] Clinic today. We will plan to:    1. RTC in 4 weeks with labs and CT imaging  2. Call with any issues          [name], FNP-BC  GU Medical Oncology  [email or web address]          Contact information:    In the event of an emergency call 911    [facility]:  Phone: [phone]  Fax: [phone]    After hours/nights/weekends:  Hospital Operator: [phone]    Lab Results   Component Value Date    WBC 3.9 (L) 04/02/2018    HGB 12.3 (L) 04/02/2018    HCT 37.6 (L) 04/02/2018    PLT 179 04/02/2018       Lab Results   Component Value Date    NA 136 04/02/2018    K 4.3 04/02/2018    CL 110 (H) 04/02/2018    CO2 19.0 (L) 04/02/2018    BUN 9 04/02/2018    CREATININE 1.11 04/02/2018    CALCIUM 9.3 04/02/2018    MG 1.9 01/08/2018    PHOS 3.5 07/19/2014       Lab Results   Component Value Date    PT 12.4 12/24/2016    INR 1.06 12/24/2016    APTT 31.2 12/24/2016

## 2018-04-02 NOTE — Unmapped (Signed)
Clinical Pharmacist Practitioner: [facility]cology Clinic    Oral Chemotherapy Follow-Up  Assessment and recommendations:    1. Cabozantinib monitoring and side effect management- Mr. Fecher is tolerating the lower dose of cabozantinib 20 mg, but has experienced some blisters on hands and diarrhea (noted below).   - Continue cabozantinib 20 mg daily     2. Hypertension- BP today in clinic was 144/76. Checks BP at home occasionally, unable to recall average.    - Continue amlodipine 10 mg daily  - Continue losartan 25 mg daily  - Monitor BP at home     3. Grade 1 Palmer-plantar erythrodysesthesia: continues with decreased dose of cabozantinib, has blisters that are resolving, he is using his urea cream intermittently   - Use urea cream twice daily and diclofenac gel PRN for pain relief    4. Grade 1 Diarrhea- Reports 3-4 bowel movements a day for the past two weeks. No evidence of dehydration or electrolyte abnormalities.    -Recommend to stay well hydrated with 2L of water daily  - If continues without resolution consider loperamide prn    5. Monitoring previous AST elevation- LFTs from today were WNL  - With history of cirrhosis, will continue to monitor    Follow- up: 04/30/2018 with [name]  ______________________________________________________________________    Oral Chemotherapy: Cabozantinib 40 mg daily (empirically dose adjusted due to h/o cirrhosis), held 08/15/17-08/28/17 for hypertension and PPE, and restarted at 20 mg daily on 08/28/17    Start date: 07/08/17    HPI: 67 y.o.m with alcoholic cirrhosis, HTN, and h/o pT3a ccRCC s/p right nephrectomy 06/2014, with local recurrence, was started on sunitinib 07/27/16-12/03/16, transitioined to treatment via OMNIVORE trial (01/07/2017-06/24/2017) with POD. On cabozantinib since 07/08/17, with plan to continue at reduced dose of 20 mg daily.    Interm History:  Mr. Tafoya reports feeling well today in clinic. He continues to take only 1 tablet of cabozantinib each morning on an empty stomach. He denies missing any doses of medication. His BP in clinic today is 144/76, and he checks it at home occasionally but unable to report ranges. He denies any headaches or dizziness. Lastly, he has some blisters on his hands that are improving.They don't cause much pain and he is still able to perform his daily functions.  He reports using urea cream and diclofenac gel intermittently.      Adherence: Takes cabozantinib 20 mg (1 tablet) each morning on an empty stomach.    Toxicities: PPE, diarrhea    Drug-Drug Interactions: none    Medications:  Current Outpatient Medications   Medication Sig Dispense Refill   ??? amLODIPine (NORVASC) 10 MG tablet Take 1 tablet (10 mg total) by mouth daily. 30 tablet 6   ??? B complex-vitamin C-folic acid (RENA-VITE) 0.8 mg Tab Take 1 tablet daily 30 tablet 12   ??? cabozantinib 20 mg Tab TAKE 1 TABLET BY MOUTH ONCE DAILY AT 6:00 AM 30 each 3   ??? diclofenac sodium (VOLTAREN) 1 % gel Apply 2 g topically every six (6) hours as needed for arthritis. 100 g 0   ??? levothyroxine (SYNTHROID, LEVOTHROID) 112 MCG tablet TK 1 T PO QAM ON AN EMPTY STOMACH  1   ??? losartan (COZAAR) 25 MG tablet Take 1 tablet (25 mg total) by mouth daily. 90 tablet 3   ??? omeprazole (PRILOSEC) 20 MG capsule TAKE 1 CAPSULE BY MOUTH ONCE DAILY 90 capsule 0   ??? omeprazole 20 mg tablet Take 20 mg by mouth  daily.     ??? urea (CARMOL) 40 % cream Apply twice daily to sore areas on hands and feet 198.6 g 1   ??? urea 40 % Lotn Apply 1 application topically daily. 236.6 Bottle 2     No current facility-administered medications for this visit.      Entered by Britt Bolognese, PharmD Candidate, acting as scribe for Girtha Rm, PharmD.   April 02, 2018 11:55 AM    I spent 20 minutes with Mr.Barnard in direct patient care.    The documentation recorded by the scribe accurately reflects the service I personally performed and the decisions made by me. April 02, 2018 3:53 PM.    Laverna Peace PharmD, BCOP, CPP  Hematology/Oncology Pharmacist  P: [phone]

## 2018-04-16 MED ORDER — CABOZANTINIB 20 MG TABLET
5 refills | 0 days | Status: CP
Start: 2018-04-16 — End: 2018-07-24
  Filled 2018-04-24: qty 30, 30d supply, fill #0

## 2018-04-16 NOTE — Unmapped (Signed)
[facility] Refill Coordination Note    Specialty Medication(s) to be Shipped:   Hematology/Oncology: Cabozantinib 20mg      Sergio Horton, DOB: September 05, 1950  Phone: [phone] (home) [phone] (work)  Shipping Address: [address]  [name] [address]    All above HIPAA information was verified with patient.     Completed refill call assessment today to schedule patient's medication shipment from the [facility] [phone]).       Specialty medication(s) and dose(s) confirmed: Regimen is correct and unchanged.   Changes to medications: Sergio Horton reports no changes reported at this time.  Changes to insurance: No  Questions for the pharmacist: No    The patient will receive a drug information handout for each medication shipped and additional FDA Medication Guides as required.      DISEASE/MEDICATION-SPECIFIC INFORMATION        N/A    ADHERENCE     Medication Adherence    Patient reported X missed doses in the last month:  0  Adherence tools used:  directed education   Other adherence tool:  morning routine   Support network for adherence:  family member          MEDICARE PART B DOCUMENTATION     Cabozantinib 20mg : Patient has 13 tablets on hand.    SHIPPING     Shipping address confirmed in Epic.     Delivery Scheduled: Yes, Expected medication delivery date: 04/25/2018 via UPS or courier.     Sergio Horton [name]   [facility] Specialty Technician

## 2018-04-24 MED FILL — CABOMETYX 20 MG TABLET: 30 days supply | Qty: 30 | Fill #0 | Status: AC

## 2018-04-28 NOTE — Unmapped (Signed)
[name] was unavailable at time of call, left message to let [name] know that per the care team, he should wait for the new shipment to arrive; he should not take the 40mg  tablets, wait for the 20mg  tablets to arrive.

## 2018-04-28 NOTE — Unmapped (Signed)
AOC Triage Note     Patient: Sergio Horton     Reason for call: Prescriptions    Time call returned: 1413     Phone Assessment: Patient reported that he was supposed to get a delivery of the Cabozantinib on Friday, however the medication was lost in shipment and he is working on resolving this issue with pharmacy. Patient is out of the Cabozantinib 20mg  tablets, but has some 40mg  tablets left over, he would like to know if he should just take a 40 mg tablet tomorrow morning, or if he should just wait to take the medication after the delivery tomorrow afternoon.      Triage Recommendations: Will discuss with care team and let patient know what to do.     Patient Response: Verbalized understanding.     Outstanding tasks: Please advise what patient should do with his Cabozantinib.     Patient Pharmacy has been verified and primary pharmacy has been marked as preferred

## 2018-04-28 NOTE — Unmapped (Signed)
Hi,     Amilcar contacted the [employer] requesting to speak with the care team of Sergio Horton to discuss:    Clarification on the plan for his prescriptions    Please contact at [phone].    Thank you,   Vernie Ammons  [facility]   [phone]

## 2018-04-30 ENCOUNTER — Ambulatory Visit: Admit: 2018-04-30 | Discharge: 2018-04-30 | Payer: MEDICARE | Attending: Family | Primary: Family

## 2018-04-30 ENCOUNTER — Ambulatory Visit: Admit: 2018-04-30 | Discharge: 2018-04-30 | Payer: MEDICARE

## 2018-04-30 ENCOUNTER — Other Ambulatory Visit: Admit: 2018-04-30 | Discharge: 2018-04-30 | Payer: MEDICARE

## 2018-04-30 DIAGNOSIS — C779 Secondary and unspecified malignant neoplasm of lymph node, unspecified: Principal | ICD-10-CM

## 2018-04-30 DIAGNOSIS — C649 Malignant neoplasm of unspecified kidney, except renal pelvis: Secondary | ICD-10-CM

## 2018-04-30 DIAGNOSIS — E039 Hypothyroidism, unspecified: Principal | ICD-10-CM

## 2018-04-30 LAB — CBC W/ AUTO DIFF
BASOPHILS ABSOLUTE COUNT: 0 10*9/L (ref 0.0–0.1)
BASOPHILS RELATIVE PERCENT: 0.3 %
EOSINOPHILS ABSOLUTE COUNT: 0.1 10*9/L (ref 0.0–0.4)
EOSINOPHILS RELATIVE PERCENT: 2.9 %
HEMATOCRIT: 39.4 % — ABNORMAL LOW (ref 41.0–53.0)
HEMOGLOBIN: 12.9 g/dL — ABNORMAL LOW (ref 13.5–17.5)
LARGE UNSTAINED CELLS: 4 % (ref 0–4)
LYMPHOCYTES ABSOLUTE COUNT: 1.4 10*9/L — ABNORMAL LOW (ref 1.5–5.0)
LYMPHOCYTES RELATIVE PERCENT: 31.9 %
MEAN CORPUSCULAR HEMOGLOBIN CONC: 32.6 g/dL (ref 31.0–37.0)
MEAN CORPUSCULAR HEMOGLOBIN: 33.5 pg (ref 26.0–34.0)
MEAN CORPUSCULAR VOLUME: 102.6 fL — ABNORMAL HIGH (ref 80.0–100.0)
MEAN PLATELET VOLUME: 7.8 fL (ref 7.0–10.0)
MONOCYTES ABSOLUTE COUNT: 0.3 10*9/L (ref 0.2–0.8)
MONOCYTES RELATIVE PERCENT: 6.3 %
NEUTROPHILS RELATIVE PERCENT: 54.5 %
RED BLOOD CELL COUNT: 3.84 10*12/L — ABNORMAL LOW (ref 4.50–5.90)
RED CELL DISTRIBUTION WIDTH: 16 % — ABNORMAL HIGH (ref 12.0–15.0)
WBC ADJUSTED: 4.5 10*9/L (ref 4.5–11.0)

## 2018-04-30 LAB — AST (SGOT): Aspartate aminotransferase:CCnc:Pt:Ser/Plas:Qn:: 57 — ABNORMAL HIGH

## 2018-04-30 LAB — COMPREHENSIVE METABOLIC PANEL
ALBUMIN: 3.7 g/dL (ref 3.5–5.0)
ALKALINE PHOSPHATASE: 74 U/L (ref 38–126)
ALT (SGPT): 36 U/L (ref 19–72)
ANION GAP: 9 mmol/L (ref 7–15)
AST (SGOT): 57 U/L — ABNORMAL HIGH (ref 19–55)
BILIRUBIN TOTAL: 0.6 mg/dL (ref 0.0–1.2)
BLOOD UREA NITROGEN: 10 mg/dL (ref 7–21)
BUN / CREAT RATIO: 9
CALCIUM: 9.1 mg/dL (ref 8.5–10.2)
CHLORIDE: 103 mmol/L (ref 98–107)
CO2: 20 mmol/L — ABNORMAL LOW (ref 22.0–30.0)
CREATININE: 1.09 mg/dL (ref 0.70–1.30)
EGFR CKD-EPI NON-AA MALE: 69 mL/min/{1.73_m2} (ref >=60)
POTASSIUM: 4.9 mmol/L (ref 3.5–5.0)
SODIUM: 132 mmol/L — ABNORMAL LOW (ref 135–145)

## 2018-04-30 LAB — EOSINOPHILS ABSOLUTE COUNT: Lab: 0.1

## 2018-04-30 LAB — THYROID STIMULATING HORMONE: Thyrotropin:ACnc:Pt:Ser/Plas:Qn:: 5.15 — ABNORMAL HIGH

## 2018-04-30 LAB — SMEAR REVIEW

## 2018-04-30 LAB — FREE T4: Thyroxine.free:MCnc:Pt:Ser/Plas:Qn:: 1.93 — ABNORMAL HIGH

## 2018-04-30 LAB — MAGNESIUM: Magnesium:MCnc:Pt:Ser/Plas:Qn:: 1.9

## 2018-04-30 NOTE — Unmapped (Signed)
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 He has progressed through Sunitnib, Nivolumab + Ipilimumab via OMNIVORE clinical trial, and is now on treatment via Cabozantinb 20mg .       1. ccRCC: On treatment via Cabozantinib 40mg  (started at 40 mg given liver disease)  ?? Previously treated with Sunitnib (07/27/2016-12/03/2016), Nivolumab + Ipilimumab + Nivo via OMNIVORE clinical trial (01/21/2017-05/29/2017)  ?? Cabozantinib 40mg  (07/08/2017-present). Dose reduced 20mg  given constellation of SE's including PPE, fatigue, HTN, mild transaminitis. HE is tolerating 20mg  well at this time and overall continues to have a good response  ??  Previous imaging from 04/2018, with overall disease stability, no new areas of concern.     2. Cirrhosis: Has intermittent baseline leukopenia and thrombocytopia, although reports no episodes of decompensated cirrhosis and currently in normal range. Stable and well compensated.   ?? Avoids tylenol    3. HTN: better controled. Currently on amlodipine 10mg  and losartan 25mg .   ?? Home readings 130-140's/80's  ?? Discontinued lisinopril previously due to cough     4. Asymmetric LE edema: No DVT per recent doppler    5. Elevated TSH: On synthroid, dose changed to 112 mcg on 12/27/2017 by PCP    6. Ventral hernia: easily reduces. Advised wearing abdominal binder if is becomes bothersome. He is met with  Dr. Carlynn Purl and will follow up with her after imaging has been completed in October.     7. Elevated sCr: stable; CTM     8. GERD: prilosec    9. Health Maintenance: Influenza due    10. PPE --no blisters noted today   ?? Continue urea cream    11. Weight loss: He has lost 15lbs since Jan. He reports no appetite changes, and he has intentionally tried to lose weight in preparation for hernia surgery.   * To Note- If he proceeds with hernia surgery, he will need to hold cabozantinib 28 days prior to surgery.       Plan:  -- Continue Cabozantinib 20mg  daily   -- CTM LFT's and TSH closely   -- CTM BP closely at home  -- He will continue to use urea cream to hands and feet  -- RTC in 4 weeks with follow up visit and labs  -- Repeat imaging in 4 weeks    Greater than 50% of today's 30 minute office visit was dedicated to face-to-face counseling on test results, diagnosis, treatment and prognosis-- [name], FNP- BC    History of Present Illness:    Sergio Horton is a 67 y.o. male with h/o right nephrectomy and caval thrombectomy for ccRCC who is seen in follow-up for locally recurrent RCC, on treatment via cabozantinib.     He originally presented in 2015 with a large right renal mass. He underwent nephrectomy in 06/2014 and pathology showed a 11.5cm grade 2 ccRCC with invasion into the perirenal adipose tissue (pT3a). He also had a retrocaval mass resected that also was ccRCC. Post-op course was complicated by respiratory failure requiring ECMO. He was in the hospital for a month after his surgery. Routine imaging demonstrated likely local recurrence and biopsy confirmed RCC. He initiated sunitinib on 07/27/16 and dose reduced 08/22/16 due to neutropenia, restarted 09/03/16, the dose reduced  again for HFS. CT on 12/03/16 showed POD and sunitinib was stopped.  He transitioned to treatment via OMNIVORE trial (01/07/2017-06/24/2017), that utilized single agent nivolumab, until POD was noted, then he received 2 cycles of Ipi + Nivo. Repeat imaging reported POD.     Given POD he started Cabozantinib on 07/03/2017.     Interval History:  The patient is here in follow up on cabozantinib. He continues to do well. PPE has improved. No new areas. He denies any focal pain, no diarrhea. He continues to work. His appetite and weight are stable. He is planning on having his parastomal hernia repaired at the end of the year.     Past Medical History:  Past Medical History:   Diagnosis Date   ??? Cirrhosis of liver (CMS-HCC)    ??? Hypertension    ??? Hypothyroidism    ??? Renal cancer (CMS-HCC)    ??? Thrombocytopathia (CMS-HCC)    Alcoholic Cirrhosis - denies any ascites or h/o encephalopathy  HTN   No history of autoimmune disorder.    Medications:    Current Outpatient Medications on File Prior to Visit   Medication Sig Dispense Refill   ??? amLODIPine (NORVASC) 10 MG tablet Take 1 tablet (10 mg total) by mouth daily. 30 tablet 6   ??? B complex-vitamin C-folic acid (RENA-VITE) 0.8 mg Tab Take 1 tablet daily 30 tablet 12   ??? cabozantinib 20 mg Tab TAKE 1 TABLET BY MOUTH ONCE DAILY AT 6:00 AM 30 each 5   ??? levothyroxine (SYNTHROID, LEVOTHROID) 112 MCG tablet TK 1 T PO QAM ON AN EMPTY STOMACH  1   ??? losartan (COZAAR) 25 MG tablet Take 1 tablet (25 mg total) by mouth daily. 90 tablet 3   ??? omeprazole (PRILOSEC) 20 MG capsule TAKE 1 CAPSULE BY MOUTH ONCE DAILY 90 capsule 0   ??? urea (CARMOL) 40 % cream Apply twice daily to sore areas on hands and feet 198.6 g 1   ??? diclofenac sodium (VOLTAREN) 1 % gel Apply 2 g topically every six (6) hours as needed for arthritis. (Patient not taking: Reported on 04/30/2018) 100 g 0   ??? [DISCONTINUED] lisinopril (PRINIVIL,ZESTRIL) 10 MG tablet Take 1 tablet (10 mg total) by mouth daily. 90 tablet 3     No current facility-administered medications on file prior to visit.      Allergies:  No Known Allergies    Social History:  Lives in [place] with wife. He has a h/o cirrhosis from alcohol use but does not currently use alcohol. No tobacco use. No other drugs. Works as a Financial risk analyst.    Review of Systems: A 10 system review was completed and was negative except per HPI.    Physical Exam:  Vitals:    04/30/18 1141   BP: 149/73   Pulse: 56   Resp: 20   SpO2: 97%     ECOG PS 1    General:   No acute distress, alert and interactive   Eyes:    Extra ocular muscles intact.  Sclera not icteric.   ENT:  Oropharynx clear.   Neck:  Supple   Lymph Nodes:  No cervical adenopathy   Cardiovascular:  RRR without murmurs, rubs, gallops   Lungs:  Clear to auscultation bilaterally, without wheezes/crackles/rhonchi.   Skin:    Healing hand blisters, no other rash.    Abdomen:   Abdomen soft, not tender and not distended, +incisional hernia, well-healed incision   Extremities:   Warm and  well-perfused with L>R edema.   Neurological:  Alert and oriented to person, place and time.     Labs:  Reviewed in Epic    Imaging:    05/25/16 CT CAP:  -- Status post right nephrectomy.  -- Irregular soft tissue density foci with peripheral enhancement in the right nephrectomy bed and extending inferiorly along the psoas muscle, concerning for recurrent malignancy.  -- Aortocaval lymphadenopathy.    07/04/16 Bone scan  -No evidence of metastatic osseous disease.    07/20/16 Echo:  ?? Normal left ventricular systolic function, ejection fraction > 55%  ?? Diastolic dysfunction - grade I (normal filling pressures)  ?? Normal right ventricular systolic function      09/24/16 CT CAP:  -- Status post right nephrectomy. There has been interval response to therapy, with decreasing size of recurrent tumor within the nephrectomy bed, and decreasing aortocaval nodal tissue.  -- Unchanged ultrasound morphology of liver compatible with history of cirrhosis.    12/03/16 CT CAP:  -Worsening metastatic disease in the right nephrectomy bed with enlarging aortocaval nodes/nodules and new perihepatic implant.    For reference pericaval nodes/nodules now measure up to 2 cm, previously 1.5 cm (2:93). There is new enhancing lesion in/along the right psoas musculature measuring up to 1.8 cm (2:99-106).     03/05/2017 CT CAP s/p 4C Nivo on Omnivore:  - Increase appearance of metastatic implants along the inferior right hepatic lobe. Increased size of nodularity in the right nephrectomy and increased pericaval node/nodule compatible with worsening metastatic disease.  --Increased size of pericaval nodes/nodules measure up to 2.4 cm (2:93), previously 2.0 cm  --Slightly increased size of nodularity in the right nephrectomy bed measuring 3.6 by 3.4 x 2.0 cm (2:90)  -Small-volume ascites.      06/24/2017 CT CAP  -Progressive disease with increasing nephrectomy bed soft tissue, adjacent retroperitoneal adenopathy, right psoas/paraspinal soft tissue implants, and posterior perihepatic implant. No evidence of metastatic disease in the chest.  -Small amount of nonspecific fluid/small collection adjacent to the ventral abdominal hernia sac on the right measuring 1.6 cm. ??This appears increased compared to 04/02/17 but similar to 03/05/2017.  ????-Mild circumferential bladder wall thickening which may be due to contraction versus cystitis. Recommend correlation with urinalysis.  -Cirrhotic liver morphology.    09/11/2017 CT CAP s/p 8 weeks Cabozantinib  1. Stable to slight interval decrease in necrotic metastatic implants in the right renal fossa and necrotic aortocaval adenopathy. Stable perihepatic implant.   **Centrally necrotic 2.4 x 2.1 cm mass abutting and likely invading the adjacent right psoas muscle. This is not significantly changed from prior examination which time it measured 2.5 x 2.3 cm on remeasurement.   **3.1 x 1.9 cm necrotic mass (1:37), previously previously 3.8 x 2.2 cm.   **Aortocaval adenopathy measures up to 2.6 cm in short axis, previously 2.8 cm (1:30). A more inferior aortocaval node measures 2.5 cm, previously 2.7 cm (1:44).   2. Small amount of fluid along the right lateral aspect of the small and large bowel containing ventral hernia, nonspecific. No evidence of obstruction at the hernia site.-- he has seen a general surgeon recently regarding possible repair  3. Cirrhotic liver with small caliber gastrohepatic and esophageal varices.  4. No evidence of thoracic metastatic disease.    12/03/2017 CT CAP on Cabozantinib  1. Stable to slight interval decrease in necrotic metastatic implants in the right renal fossa and necrotic aortocaval adenopathy. Stable perihepatic implant.  2. Small amount of fluid along the right  lateral aspect of the small and large bowel containing ventral hernia, nonspecific. No evidence of obstruction at the hernia site.  3. Cirrhotic liver with small caliber gastrohepatic and esophageal varices.  4. No evidence of thoracic metastatic disease.      Pathology:    06/29/14:  Diagnosis:  A: ??Liver, segment 5, core needle biopsy  - Cirrhosis, etiology uncertain (see Light Microscopy) ??    B: ??Kidney and adrenal, right, radical nephrectomy, adrenalectomy, and caval  thrombectomy   Tumor histologic type/subtype: ??clear cell carcinoma  Sarcomatoid features:?????????? not identified   Histologic grade: Grade 2 (of 4) Fuhrman classification??????????  Tumor size: 11.5 cm diameter   Tumor focality: unifocal     Extent of invasion:??????????  ???? ?? Extra-capsular invasion into perirenal adipose tissue: not identified  ???? ?? Gerota's fascia: not involved   ???? ?? Renal sinus: not involved ??????????  ???? ?? Major veins (renal vein or segmental branches, IVC): involved (macro and  micro)  ???? ?? Ureter: not involved   ???? ?? Venous: ??involved   ???? ?? Lymphatic:??????????not involved   ???? ??   Histologic assessment of surgical margins:??????????  ???? ?? Peri-nephric adipose tissue margin (partial nephrectomy only): negative   ???? ?? Gerota's fascia: negative   ???? ?? Renal vein margin:??????????no adherent carcinoma identified on initial or  recuts of en face margin (B2) ??  ???? ?? Ureter margin: ??negative   Adrenal gland:?????????? negative   Lymph nodes:??????????none received     Pathologic findings in non-neoplastic kidney: focal global glomerulosclerosis   AJCC Stage (renal primary): ??pT3b (intrahepatic caval involvement) ?? pNx ?? pMx    C: ??Mass, retrocaval, excision   - Renal clear cell carcinoma, partially encapsulated, presumed confined by  Gerota's fascia, extending to uninked tissue edges 06/27/16:   Right nephrectomy bed, core biopsy and touch preparation:  - Diagnostic of malignancy.  - Morphology consistent with recurrence of patient's known clear cell renal cell carcinoma (Grade 2 of 4 Fuhrman Nuclear Grade).

## 2018-04-30 NOTE — Unmapped (Signed)
You were seen in the [facility] today. We will plan to:    1. RTC in 4 weeks with labs          [name], FNP-BC  [facility]  [email or web address]          Contact information:    In the event of an emergency call 911    [facility]:  Phone: [phone]  Fax: [phone]    After hours/nights/weekends:  Hospital Operator: [phone]    Lab Results   Component Value Date    WBC 4.5 04/30/2018    HGB 12.9 (L) 04/30/2018    HCT 39.4 (L) 04/30/2018    PLT 190 04/30/2018       Lab Results   Component Value Date    NA 132 (L) 04/30/2018    K 4.9 04/30/2018    CL 103 04/30/2018    CO2 20.0 (L) 04/30/2018    BUN 10 04/30/2018    CREATININE 1.09 04/30/2018    CALCIUM 9.1 04/30/2018    MG 1.9 04/30/2018    PHOS 3.5 07/19/2014       Lab Results   Component Value Date    PT 12.4 12/24/2016    INR 1.06 12/24/2016    APTT 31.2 12/24/2016

## 2018-04-30 NOTE — Unmapped (Signed)
Labs drawn and sent for analysis.  Care provided by  Y Cheek.

## 2018-05-16 MED FILL — CABOMETYX 20 MG TABLET: 30 days supply | Qty: 30 | Fill #1 | Status: AC

## 2018-05-16 MED FILL — CABOMETYX 20 MG TABLET: 30 days supply | Qty: 30 | Fill #1

## 2018-05-28 ENCOUNTER — Other Ambulatory Visit: Admit: 2018-05-28 | Discharge: 2018-05-28 | Payer: MEDICARE

## 2018-05-28 ENCOUNTER — Ambulatory Visit: Admit: 2018-05-28 | Discharge: 2018-05-28 | Payer: MEDICARE | Attending: Family | Primary: Family

## 2018-05-28 DIAGNOSIS — C779 Secondary and unspecified malignant neoplasm of lymph node, unspecified: Principal | ICD-10-CM

## 2018-05-28 DIAGNOSIS — C649 Malignant neoplasm of unspecified kidney, except renal pelvis: Secondary | ICD-10-CM

## 2018-05-28 DIAGNOSIS — E039 Hypothyroidism, unspecified: Secondary | ICD-10-CM

## 2018-05-28 DIAGNOSIS — I10 Essential (primary) hypertension: Secondary | ICD-10-CM

## 2018-05-28 LAB — CBC W/ AUTO DIFF
BASOPHILS RELATIVE PERCENT: 0.6 %
EOSINOPHILS ABSOLUTE COUNT: 0.1 10*9/L (ref 0.0–0.4)
EOSINOPHILS RELATIVE PERCENT: 3.4 %
HEMATOCRIT: 39.3 % — ABNORMAL LOW (ref 41.0–53.0)
LARGE UNSTAINED CELLS: 3 % (ref 0–4)
LYMPHOCYTES RELATIVE PERCENT: 29 %
MEAN CORPUSCULAR HEMOGLOBIN CONC: 32.4 g/dL (ref 31.0–37.0)
MEAN CORPUSCULAR HEMOGLOBIN: 33.5 pg (ref 26.0–34.0)
MEAN CORPUSCULAR VOLUME: 103.5 fL — ABNORMAL HIGH (ref 80.0–100.0)
MEAN PLATELET VOLUME: 8.2 fL (ref 7.0–10.0)
MONOCYTES ABSOLUTE COUNT: 0.3 10*9/L (ref 0.2–0.8)
MONOCYTES RELATIVE PERCENT: 7.2 %
NEUTROPHILS ABSOLUTE COUNT: 2.1 10*9/L (ref 2.0–7.5)
NEUTROPHILS RELATIVE PERCENT: 57.4 %
PLATELET COUNT: 169 10*9/L (ref 150–440)
RED BLOOD CELL COUNT: 3.8 10*12/L — ABNORMAL LOW (ref 4.50–5.90)
RED CELL DISTRIBUTION WIDTH: 15.9 % — ABNORMAL HIGH (ref 12.0–15.0)
WBC ADJUSTED: 3.7 10*9/L — ABNORMAL LOW (ref 4.5–11.0)

## 2018-05-28 LAB — COMPREHENSIVE METABOLIC PANEL
ALBUMIN: 3.4 g/dL — ABNORMAL LOW (ref 3.5–5.0)
ALKALINE PHOSPHATASE: 67 U/L (ref 38–126)
ALT (SGPT): 34 U/L (ref <50)
ANION GAP: 6 mmol/L — ABNORMAL LOW (ref 7–15)
AST (SGOT): 47 U/L (ref 19–55)
BILIRUBIN TOTAL: 0.4 mg/dL (ref 0.0–1.2)
BLOOD UREA NITROGEN: 11 mg/dL (ref 7–21)
CALCIUM: 9 mg/dL (ref 8.5–10.2)
CHLORIDE: 106 mmol/L (ref 98–107)
CO2: 25 mmol/L (ref 22.0–30.0)
CREATININE: 1.08 mg/dL (ref 0.70–1.30)
EGFR CKD-EPI AA MALE: 81 mL/min/{1.73_m2} (ref >=60)
GLUCOSE RANDOM: 86 mg/dL (ref 65–179)
POTASSIUM: 4.3 mmol/L (ref 3.5–5.0)
PROTEIN TOTAL: 6.3 g/dL — ABNORMAL LOW (ref 6.5–8.3)
SODIUM: 137 mmol/L (ref 135–145)

## 2018-05-28 LAB — SMEAR REVIEW

## 2018-05-28 LAB — EOSINOPHILS ABSOLUTE COUNT: Lab: 0.1

## 2018-05-28 LAB — CHLORIDE: Chloride:SCnc:Pt:Ser/Plas:Qn:: 106

## 2018-05-28 MED ORDER — UREA 40 % TOPICAL CREAM
1 refills | 0 days | Status: CP
Start: 2018-05-28 — End: 2018-08-20

## 2018-05-28 MED ORDER — AMLODIPINE 10 MG TABLET
ORAL_TABLET | Freq: Every day | ORAL | 6 refills | 0.00000 days | Status: CP
Start: 2018-05-28 — End: 2019-02-02

## 2018-05-28 NOTE — Unmapped (Addendum)
You were seen in the [facility] today. We will plan to:    1. RTC in 4 weeks with labs and CT imaging          [name], FNP-BC  [facility]  [email or web address]          Contact information:    In the event of an emergency call 911    [facility]:  Phone: [phone]  Fax: [phone]    After hours/nights/weekends:  Hospital Operator: [phone]

## 2018-05-28 NOTE — Unmapped (Signed)
Labs drawn and sent for analysis.  Care provided by  Y Nemtsev, RN

## 2018-05-28 NOTE — Unmapped (Signed)
Genitourinary Oncology Clinic Follow-Up Note    Patient Name: Sergio Horton  Encounter Date: 05/28/2018    Referring Physician: Renford Dills, [place], [place] [facility])  Urologist: Gean Quint    Assessment/Plan:    67 y.o.M with alcoholic cirrhosis, HTN, and h/o pT3a ccRCC s/p 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 He has progressed through Sunitnib, Nivolumab + Ipilimumab via OMNIVORE clinical trial, and is now on treatment via Cabozantinb 20mg .       1. ccRCC: On treatment via Cabozantinib 40mg  (started at 40 mg given liver disease)  ?? Previously treated with Sunitnib (07/27/2016-12/03/2016), Nivolumab + Ipilimumab + Nivo via OMNIVORE clinical trial (01/21/2017-05/29/2017)  ?? Cabozantinib 40mg  (07/08/2017-present). Dose reduced 20mg  given constellation of SE's including PPE, fatigue, HTN, mild transaminitis. HE is tolerating 20mg  well at this time and overall continues to have a good response  ??  Previous imaging from 04/2018, with overall disease stability, minimal increase in paraortic node, but they are also quite necrotic, likely 2/2 to treatment effects. no new areas of concern.     2. Cirrhosis: Has intermittent baseline leukopenia and thrombocytopia, although reports no episodes of decompensated cirrhosis and currently in normal range. Stable and well compensated.   ?? Avoids tylenol    3. HTN: better controled. Currently on amlodipine 10mg  and losartan 25mg .  ?? Home readings 130-140's/80's  ?? Discontinued lisinopril previously due to cough     4. Asymmetric LE edema: No DVT per recent doppler    5. Elevated TSH: On synthroid, follows with PCP    6. Ventral hernia: easily reduces. Advised wearing abdominal binder if is becomes bothersome. He has met with  Dr. Carlynn Purl and will follow up with her after imaging has been completed in October. To note, if he proceeds with surgery, he will need to HOLD cabozantinib for 28 days prior to surgery and he can restart once healed and risk of bleeding has passed.      7. Elevated sCr: stable; CTM     8. GERD: prilosec    9. Health Maintenance: Influenza given    10. PPE --no blisters, sores, peeling, noted today   ?? Continue urea cream    11. Weight loss: He has lost 15lbs since Jan. He reports no appetite changes, and he has intentionally tried to lose weight in preparation for hernia surgery.   * To Note- If he proceeds with hernia surgery, he will need to hold cabozantinib 28 days prior to surgery.       Plan:  -- Continue Cabozantinib 20mg  daily   -- CTM LFT's and TSH closely   -- CTM BP closely at home  -- He will continue to use urea cream to hands and feet  -- RTC in 4 weeks with follow up visit and labs  -- Repeat imaging in 4 weeks    Greater than 50% of today's 30 minute office visit was dedicated to face-to-face counseling on test results, diagnosis, treatment and prognosis-- [name], FNP- BC    History of Present Illness:    Sergio Horton is a 67 y.o. male with h/o right nephrectomy and caval thrombectomy for ccRCC who is seen in follow-up for locally recurrent RCC, on treatment via cabozantinib.     He originally presented in 2015 with a large right renal mass. He underwent nephrectomy in 06/2014 and pathology showed a 11.5cm grade 2 ccRCC with invasion into the perirenal adipose tissue (pT3a). He also had a retrocaval mass resected that also was ccRCC. Post-op course was complicated  by respiratory failure requiring ECMO. He was in the hospital for a month after his surgery. Routine imaging demonstrated likely local recurrence and biopsy confirmed RCC. He initiated sunitinib on 07/27/16 and dose reduced 08/22/16 due to neutropenia, restarted 09/03/16, the dose reduced again for HFS. CT on 12/03/16 showed POD and sunitinib was stopped.  He transitioned to treatment via OMNIVORE trial (01/07/2017-06/24/2017), that utilized single agent nivolumab, until POD was noted, then he received 2 cycles of Ipi + Nivo. Repeat imaging reported POD.     Given POD he started Cabozantinib on 07/03/2017.     Interval History:  The patient is here in follow up on cabozantinib. He continues to do well. PPE has improved. No new areas. He denies any focal pain, no diarrhea. He continues to work. His appetite and weight are stable.     Past Medical History:  Past Medical History:   Diagnosis Date   ??? Cirrhosis of liver (CMS-HCC)    ??? Hypertension    ??? Hypothyroidism    ??? Renal cancer (CMS-HCC)    ??? Thrombocytopathia (CMS-HCC)    Alcoholic Cirrhosis - denies any ascites or h/o encephalopathy  HTN   No history of autoimmune disorder.    Medications:    Current Outpatient Medications on File Prior to Visit   Medication Sig Dispense Refill   ??? B complex-vitamin C-folic acid (RENA-VITE) 0.8 mg Tab Take 1 tablet daily 30 tablet 12   ??? cabozantinib 20 mg Tab TAKE 1 TABLET BY MOUTH ONCE DAILY AT 6:00 AM 30 each 5   ??? levothyroxine (SYNTHROID, LEVOTHROID) 112 MCG tablet TK 1 T PO QAM ON AN EMPTY STOMACH  1   ??? losartan (COZAAR) 25 MG tablet Take 1 tablet (25 mg total) by mouth daily. 90 tablet 3   ??? omeprazole (PRILOSEC) 20 MG capsule TAKE 1 CAPSULE BY MOUTH ONCE DAILY 90 capsule 0   ??? diclofenac sodium (VOLTAREN) 1 % gel Apply 2 g topically every six (6) hours as needed for arthritis. (Patient not taking: Reported on 04/30/2018) 100 g 0   ??? [DISCONTINUED] lisinopril (PRINIVIL,ZESTRIL) 10 MG tablet Take 1 tablet (10 mg total) by mouth daily. 90 tablet 3     No current facility-administered medications on file prior to visit.      Allergies:  No Known Allergies    Social History:  Lives in [place] with wife. He has a h/o cirrhosis from alcohol use but does not currently use alcohol. No tobacco use. No other drugs. Works as a Financial risk analyst.    Review of Systems: A 10 system review was completed and was negative except per HPI.    Physical Exam:  Vitals:    05/28/18 0944   BP: 176/84   Pulse: 52   Resp: 16   Temp: 36.5 ??C (97.7 ??F)   SpO2: 99%     ECOG PS 1    General:   No acute distress, alert and interactive Eyes:    Extra ocular muscles intact.  Sclera not icteric.   ENT:  Oropharynx clear.   Neck:  Supple   Lymph Nodes:  No cervical adenopathy   Cardiovascular:  RRR without murmurs, rubs, gallops   Lungs:  Clear to auscultation bilaterally, without wheezes/crackles/rhonchi.   Skin:    Healing hand blisters, no other rash.    Abdomen:   Abdomen soft, not tender and not distended, +incisional hernia, well-healed incision   Extremities:   Warm and well-perfused with L>R edema.   Neurological:  Alert and oriented to person, place and time.     Labs:  Reviewed in Epic    Imaging:    05/25/16 CT CAP:  -- Status post right nephrectomy.  -- Irregular soft tissue density foci with peripheral enhancement in the right nephrectomy bed and extending inferiorly along the psoas muscle, concerning for recurrent malignancy.  -- Aortocaval lymphadenopathy.    07/04/16 Bone scan  -No evidence of metastatic osseous disease.    07/20/16 Echo:  ?? Normal left ventricular systolic function, ejection fraction > 55%  ?? Diastolic dysfunction - grade I (normal filling pressures)  ?? Normal right ventricular systolic function      09/24/16 CT CAP:  -- Status post right nephrectomy. There has been interval response to therapy, with decreasing size of recurrent tumor within the nephrectomy bed, and decreasing aortocaval nodal tissue.  -- Unchanged ultrasound morphology of liver compatible with history of cirrhosis.    12/03/16 CT CAP:  -Worsening metastatic disease in the right nephrectomy bed with enlarging aortocaval nodes/nodules and new perihepatic implant.    For reference pericaval nodes/nodules now measure up to 2 cm, previously 1.5 cm (2:93). There is new enhancing lesion in/along the right psoas musculature measuring up to 1.8 cm (2:99-106).     03/05/2017 CT CAP s/p 4C Nivo on Omnivore:  - Increase appearance of metastatic implants along the inferior right hepatic lobe. Increased size of nodularity in the right nephrectomy and increased pericaval node/nodule compatible with worsening metastatic disease.  --Increased size of pericaval nodes/nodules measure up to 2.4 cm (2:93), previously 2.0 cm  --Slightly increased size of nodularity in the right nephrectomy bed measuring 3.6 by 3.4 x 2.0 cm (2:90)  -Small-volume ascites.      06/24/2017 CT CAP  -Progressive disease with increasing nephrectomy bed soft tissue, adjacent retroperitoneal adenopathy, right psoas/paraspinal soft tissue implants, and posterior perihepatic implant. No evidence of metastatic disease in the chest.  -Small amount of nonspecific fluid/small collection adjacent to the ventral abdominal hernia sac on the right measuring 1.6 cm. ??This appears increased compared to 04/02/17 but similar to 03/05/2017.  ????-Mild circumferential bladder wall thickening which may be due to contraction versus cystitis. Recommend correlation with urinalysis.  -Cirrhotic liver morphology.    09/11/2017 CT CAP s/p 8 weeks Cabozantinib  1. Stable to slight interval decrease in necrotic metastatic implants in the right renal fossa and necrotic aortocaval adenopathy. Stable perihepatic implant.   **Centrally necrotic 2.4 x 2.1 cm mass abutting and likely invading the adjacent right psoas muscle. This is not significantly changed from prior examination which time it measured 2.5 x 2.3 cm on remeasurement.   **3.1 x 1.9 cm necrotic mass (1:37), previously previously 3.8 x 2.2 cm.   **Aortocaval adenopathy measures up to 2.6 cm in short axis, previously 2.8 cm (1:30). A more inferior aortocaval node measures 2.5 cm, previously 2.7 cm (1:44).   2. Small amount of fluid along the right lateral aspect of the small and large bowel containing ventral hernia, nonspecific. No evidence of obstruction at the hernia site.-- he has seen a general surgeon recently regarding possible repair  3. Cirrhotic liver with small caliber gastrohepatic and esophageal varices.  4. No evidence of thoracic metastatic disease.    12/03/2017 CT CAP on Cabozantinib  1. Stable to slight interval decrease in necrotic metastatic implants in the right renal fossa and necrotic aortocaval adenopathy. Stable perihepatic implant.  2. Small amount of fluid along the right lateral aspect of the small and large bowel  containing ventral hernia, nonspecific. No evidence of obstruction at the hernia site.  3. Cirrhotic liver with small caliber gastrohepatic and esophageal varices.  4. No evidence of thoracic metastatic disease.      Pathology:    06/29/14:  Diagnosis:  A: ??Liver, segment 5, core needle biopsy  - Cirrhosis, etiology uncertain (see Light Microscopy) ??    B: ??Kidney and adrenal, right, radical nephrectomy, adrenalectomy, and caval  thrombectomy   Tumor histologic type/subtype: ??clear cell carcinoma  Sarcomatoid features:?????????? not identified   Histologic grade: Grade 2 (of 4) Fuhrman classification??????????  Tumor size: 11.5 cm diameter   Tumor focality: unifocal     Extent of invasion:??????????  ???? ?? Extra-capsular invasion into perirenal adipose tissue: not identified  ???? ?? Gerota's fascia: not involved   ???? ?? Renal sinus: not involved ??????????  ???? ?? Major veins (renal vein or segmental branches, IVC): involved (macro and  micro)  ???? ?? Ureter: not involved   ???? ?? Venous: ??involved   ???? ?? Lymphatic:??????????not involved   ???? ??   Histologic assessment of surgical margins:??????????  ???? ?? Peri-nephric adipose tissue margin (partial nephrectomy only): negative   ???? ?? Gerota's fascia: negative   ???? ?? Renal vein margin:??????????no adherent carcinoma identified on initial or  recuts of en face margin (B2) ??  ???? ?? Ureter margin: ??negative   Adrenal gland:?????????? negative   Lymph nodes:??????????none received     Pathologic findings in non-neoplastic kidney: focal global glomerulosclerosis   AJCC Stage (renal primary): ??pT3b (intrahepatic caval involvement) ?? pNx ?? pMx    C: ??Mass, retrocaval, excision   - Renal clear cell carcinoma, partially encapsulated, presumed confined by  Gerota's fascia, extending to uninked tissue edges    06/27/16:   Right nephrectomy bed, core biopsy and touch preparation:  - Diagnostic of malignancy.  - Morphology consistent with recurrence of patient's known clear cell renal cell carcinoma (Grade 2 of 4 Fuhrman Nuclear Grade).

## 2018-06-10 ENCOUNTER — Ambulatory Visit (INDEPENDENT_AMBULATORY_CARE_PROVIDER_SITE_OTHER): Payer: Medicare Other

## 2018-06-10 ENCOUNTER — Ambulatory Visit (INDEPENDENT_AMBULATORY_CARE_PROVIDER_SITE_OTHER): Payer: Medicare Other | Admitting: Podiatry

## 2018-06-10 ENCOUNTER — Other Ambulatory Visit: Payer: Self-pay | Admitting: Podiatry

## 2018-06-10 DIAGNOSIS — M2012 Hallux valgus (acquired), left foot: Secondary | ICD-10-CM

## 2018-06-10 DIAGNOSIS — M2011 Hallux valgus (acquired), right foot: Secondary | ICD-10-CM

## 2018-06-10 DIAGNOSIS — D691 Qualitative platelet defects: Secondary | ICD-10-CM | POA: Insufficient documentation

## 2018-06-10 DIAGNOSIS — Z79899 Other long term (current) drug therapy: Secondary | ICD-10-CM | POA: Insufficient documentation

## 2018-06-10 DIAGNOSIS — Q828 Other specified congenital malformations of skin: Secondary | ICD-10-CM

## 2018-06-10 DIAGNOSIS — K7460 Unspecified cirrhosis of liver: Secondary | ICD-10-CM | POA: Insufficient documentation

## 2018-06-10 NOTE — Patient Instructions (Signed)

## 2018-06-12 MED FILL — CABOMETYX 20 MG TABLET: 30 days supply | Qty: 30 | Fill #2 | Status: AC

## 2018-06-12 MED FILL — CABOMETYX 20 MG TABLET: 30 days supply | Qty: 30 | Fill #2

## 2018-06-12 NOTE — Unmapped (Signed)
[facility] Refill Coordination Note    Specialty Medication(s) to be Shipped:   Hematology/Oncology: Sergio Horton, DOB: 02-27-1951  Phone: [phone] (home) [phone] (work)      All above HIPAA information was verified with patient.     Completed refill call assessment today to schedule patient's medication shipment from the [facility] [phone]).       Specialty medication(s) and dose(s) confirmed: Regimen is correct and unchanged.   Changes to medications: Sergio Horton reports no changes reported at this time.  Changes to insurance: No  Questions for the pharmacist: No    The patient will receive a drug information handout for each medication shipped and additional FDA Medication Guides as required.      DISEASE/MEDICATION-SPECIFIC INFORMATION        N/A    ADHERENCE     Medication Adherence    Patient reported X missed doses in the last month:  0  Specialty Medication:  CABOMETYX 20 mg   Patient is on additional specialty medications:  No  Patient is on more than two specialty medications:  No  Any gaps in refill history greater than 2 weeks in the last 3 months:  no  Demonstrates understanding of importance of adherence:  yes  Informant:  patient  Reliability of informant:  reliable      Adherence tools used:  directed education   Other adherence tool:  morning routine   Support network for adherence:  family member      Confirmed plan for next specialty medication refill:  delivery by pharmacy          Refill Coordination    Has the Patients' Contact Information Changed:  No  Is the Shipping Address Different:  No         MEDICARE PART B DOCUMENTATION     CABOMETYX 20 mg : Patient has 4 days worth on hand.    SHIPPING     Shipping address confirmed in Epic.     Delivery Scheduled: Yes, Expected medication delivery date: 06/13/2018 via UPS or courier.     Medication will be delivered via UPS to the home address in Epic WAM.    [name]   [facility] Specialty Technician

## 2018-06-12 NOTE — Progress Notes (Signed)
Subjective:   Patient ID: Stephen Grimes, male   DOB: 67 y.o.   MRN: [id number]   HPI 67 year old male presents the office with concerns of bunions to both of the callus from the right foot.  He states that he has had no recent treatment for the bunions.  He states that hurts worse living on his feet all day.  He denies any numbness or tingling.  Denies any recent injury.  Said no recent treatment.  The callus on the right foot is been ongoing for the last 6 years he has been using a callus pad which helps some.  He has no other concerns today.   Review of Systems  All other systems reviewed and are negative.  Past Medical History:  Diagnosis Date  . Cancer    kidney cancer  . Cirrhosis   . COPD (chronic obstructive pulmonary disease)   . Coronary artery disease   . Hypertension   . Renal disorder   . Sleep apnea   . Thrombocytopathia   . Thrombocytopenia 12/11/2011  . Thyroid disease     Past Surgical History:  Procedure Laterality Date  . kidney removed Right   . TESTICLE REMOVAL       Current Outpatient Medications:  .  acetaminophen (TYLENOL) 160 MG/5ML liquid, Take 650 mg by mouth every 4 (four) hours as needed for pain., Disp: , Rfl:  .  amLODipine (NORVASC) 10 MG tablet, Take by mouth., Disp: , Rfl:  .  cabozantinib (CABOMETYX) 20 MG tablet, TAKE 1 TABLET BY MOUTH ONCE DAILY AT 6:00 AM, Disp: , Rfl:  .  cholestyramine (QUESTRAN) 4 G packet, Take 4 g by mouth 2 (two) times daily., Disp: , Rfl:  .  ciprofloxacin (CIPRO) 500 MG tablet, Take 500 mg by mouth 2 (two) times daily., Disp: , Rfl:  .  diclofenac sodium (VOLTAREN) 1 % GEL, Apply topically., Disp: , Rfl:  .  docusate sodium (COLACE) 100 MG capsule, Take 100 mg by mouth 2 (two) times daily., Disp: , Rfl:  .  levothyroxine (SYNTHROID, LEVOTHROID) 112 MCG tablet, TK 1 T PO QAM ON AN EMPTY STOMACH, Disp: , Rfl: 1 .  levothyroxine (SYNTHROID, LEVOTHROID) 50 MCG tablet, Take 50 mcg by mouth Daily., Disp: , Rfl:  .   metroNIDAZOLE (FLAGYL) 500 MG tablet, Take 500 mg by mouth 3 (three) times daily., Disp: , Rfl:  .  multivitamin (RENA-VIT) TABS tablet, Take 1 tablet daily, Disp: , Rfl:  .  omeprazole (PRILOSEC) 20 MG capsule, TAKE 1 CAPSULE BY MOUTH ONCE DAILY, Disp: , Rfl:  .  oxyCODONE (OXY IR/ROXICODONE) 5 MG immediate release tablet, Take 5 mg by mouth every 4 (four) hours as needed for severe pain., Disp: , Rfl:  .  pantoprazole (PROTONIX) 40 MG tablet, Take 40 mg by mouth daily., Disp: , Rfl:  .  senna (SENOKOT) 8.6 MG tablet, Take 1 tablet by mouth at bedtime., Disp: , Rfl:  .  tamsulosin (FLOMAX) 0.4 MG CAPS capsule, Take 0.4 mg by mouth daily., Disp: , Rfl:  .  urea (CARMOL) 40 % CREA, Apply twice daily to sore areas on hands and feet, Disp: , Rfl:   No Known Allergies       Objective:  Physical Exam  General: AAO x3, NAD  Dermatological: Hyperkeratotic lesion right foot submetatarsal 2.  Upon debridement there is no underlying ulceration, drainage or any signs of infection noted today.  No open lesions identified otherwise.  Vascular: Dorsalis Pedis artery and Posterior  Tibial artery pedal pulses are 2/4 bilateral with immedate capillary fill time. There is no pain with calf compression, swelling, warmth, erythema.   Neruologic: Grossly intact via light touch bilateral. Protective threshold with Semmes Wienstein monofilament intact to all pedal sites bilateral.   Musculoskeletal: There is decreased range of motion of first MPJ bilaterally there is bunion deformity present.  Tenderness on the first MPJ bilaterally.  There is no area pinpoint bony tenderness or pain to vibratory sensation otherwise.  No other areas of tenderness.  Muscular strength 5/5 in all groups tested bilateral.  Gait: Unassisted, Nonantalgic.       Assessment:   Hallux limitus, bunion deformity bilaterally with hyperkeratotic lesions due to biomechanical changes     Plan:  -Treatment options discussed including  all alternatives, risks, and complications -Etiology of symptoms were discussed -X-rays were obtained and reviewed with the patient.  Bunion deformities present with arthritic changes present the first MPJ but there is no definitive evidence of acute fracture identified today. -In regards to the callus I sharply debrided this without any complications or bleeding.  Discussed offloading pads.  In regards to the findings we discussed both conservative as well as surgical treatment options.  Continue conservative care for now.  We discussed wearing a stiffer soled shoe as well as possibly an orthotic.  Dispensed offloading pads.  Consider steroid injection.  Trula Slade DPM

## 2018-06-18 MED ORDER — OMEPRAZOLE 20 MG CAPSULE,DELAYED RELEASE
ORAL_CAPSULE | 0 refills | 0 days | Status: CP
Start: 2018-06-18 — End: 2018-09-16

## 2018-07-05 NOTE — Unmapped (Signed)
[facility] Refill Coordination Note    Specialty Medication(s) to be Shipped:   Hematology/Oncology: Sergio Horton, DOB: 1950-08-15  Phone: [phone] (home) [phone] (work)      All above HIPAA information was verified with patient.     Completed refill call assessment today to schedule patient's medication shipment from the [facility] [phone]).       Specialty medication(s) and dose(s) confirmed: Regimen is correct and unchanged.   Changes to medications: Sequan reports no changes reported at this time.  Changes to insurance: No  Questions for the pharmacist: No    The patient will receive a drug information handout for each medication shipped and additional FDA Medication Guides as required.      DISEASE/MEDICATION-SPECIFIC INFORMATION        N/A    ADHERENCE     Medication Adherence    Patient reported X missed doses in the last month:  0  Specialty Medication:  Cabometyx 20mg   Patient is on additional specialty medications:  No  Patient is on more than two specialty medications:  No  Any gaps in refill history greater than 2 weeks in the last 3 months:  no  Demonstrates understanding of importance of adherence:  yes  Informant:  patient  Reliability of informant:  reliable      Adherence tools used:  directed education   Other adherence tool:  morning routine   Support network for adherence:  family member      Confirmed plan for next specialty medication refill:  delivery by pharmacy          Refill Coordination    Has the Patients' Contact Information Changed:  No  Is the Shipping Address Different:  No         MEDICARE PART B DOCUMENTATION     Cabometyx 20mg : Patient has 8 days worth on hand.    SHIPPING     Shipping address confirmed in Epic.     Delivery Scheduled: Yes, Expected medication delivery date: 07/10/2018 via UPS or courier.     Medication will be delivered via UPS to the home address in Epic WAM.    [name]   [facility] Specialty Technician

## 2018-07-09 ENCOUNTER — Ambulatory Visit: Admit: 2018-07-09 | Discharge: 2018-07-10 | Payer: MEDICARE | Attending: Family | Primary: Family

## 2018-07-09 ENCOUNTER — Other Ambulatory Visit: Admit: 2018-07-09 | Discharge: 2018-07-10 | Payer: MEDICARE

## 2018-07-09 ENCOUNTER — Ambulatory Visit: Admit: 2018-07-09 | Discharge: 2018-07-10 | Payer: MEDICARE

## 2018-07-09 DIAGNOSIS — C779 Secondary and unspecified malignant neoplasm of lymph node, unspecified: Principal | ICD-10-CM

## 2018-07-09 DIAGNOSIS — C649 Malignant neoplasm of unspecified kidney, except renal pelvis: Secondary | ICD-10-CM

## 2018-07-09 DIAGNOSIS — K7460 Unspecified cirrhosis of liver: Secondary | ICD-10-CM

## 2018-07-09 LAB — CBC W/ AUTO DIFF
BASOPHILS ABSOLUTE COUNT: 0 10*9/L (ref 0.0–0.1)
BASOPHILS RELATIVE PERCENT: 0.3 %
EOSINOPHILS ABSOLUTE COUNT: 0.1 10*9/L (ref 0.0–0.4)
EOSINOPHILS RELATIVE PERCENT: 2.2 %
HEMATOCRIT: 40.8 % — ABNORMAL LOW (ref 41.0–53.0)
HEMOGLOBIN: 13.2 g/dL — ABNORMAL LOW (ref 13.5–17.5)
LARGE UNSTAINED CELLS: 3 % (ref 0–4)
LYMPHOCYTES ABSOLUTE COUNT: 1.8 10*9/L (ref 1.5–5.0)
LYMPHOCYTES RELATIVE PERCENT: 34.2 %
MEAN CORPUSCULAR HEMOGLOBIN: 33.2 pg (ref 26.0–34.0)
MEAN CORPUSCULAR VOLUME: 102.4 fL — ABNORMAL HIGH (ref 80.0–100.0)
MEAN PLATELET VOLUME: 8.1 fL (ref 7.0–10.0)
MONOCYTES RELATIVE PERCENT: 7.4 %
NEUTROPHILS ABSOLUTE COUNT: 2.8 10*9/L (ref 2.0–7.5)
NEUTROPHILS RELATIVE PERCENT: 52.9 %
PLATELET COUNT: 190 10*9/L (ref 150–440)
RED BLOOD CELL COUNT: 3.99 10*12/L — ABNORMAL LOW (ref 4.50–5.90)
RED CELL DISTRIBUTION WIDTH: 16.1 % — ABNORMAL HIGH (ref 12.0–15.0)
WBC ADJUSTED: 5.3 10*9/L (ref 4.5–11.0)

## 2018-07-09 LAB — COMPREHENSIVE METABOLIC PANEL
ALBUMIN: 3.4 g/dL — ABNORMAL LOW (ref 3.5–5.0)
ALKALINE PHOSPHATASE: 71 U/L (ref 38–126)
ALT (SGPT): 29 U/L (ref <50)
ANION GAP: 8 mmol/L (ref 7–15)
AST (SGOT): 47 U/L (ref 19–55)
BILIRUBIN TOTAL: 0.4 mg/dL (ref 0.0–1.2)
BLOOD UREA NITROGEN: 12 mg/dL (ref 7–21)
CHLORIDE: 104 mmol/L (ref 98–107)
CO2: 22 mmol/L (ref 22.0–30.0)
CREATININE: 0.99 mg/dL (ref 0.70–1.30)
EGFR CKD-EPI AA MALE: 89 mL/min/{1.73_m2} (ref >=60)
GLUCOSE RANDOM: 74 mg/dL (ref 70–179)
POTASSIUM: 4.2 mmol/L (ref 3.5–5.0)
PROTEIN TOTAL: 6.3 g/dL — ABNORMAL LOW (ref 6.5–8.3)
SODIUM: 134 mmol/L — ABNORMAL LOW (ref 135–145)

## 2018-07-09 LAB — RED CELL DISTRIBUTION WIDTH: Lab: 16.1 — ABNORMAL HIGH

## 2018-07-09 LAB — SMEAR REVIEW

## 2018-07-09 LAB — CHLORIDE: Chloride:SCnc:Pt:Ser/Plas:Qn:: 104

## 2018-07-09 MED FILL — CABOMETYX 20 MG TABLET: 30 days supply | Qty: 30 | Fill #3

## 2018-07-09 MED FILL — CABOMETYX 20 MG TABLET: 30 days supply | Qty: 30 | Fill #3 | Status: AC

## 2018-07-09 NOTE — Unmapped (Signed)
Genitourinary Oncology Clinic Follow-Up Note    Patient Name: Sergio Horton  Encounter Date: 07/09/2018    Referring Physician: Renford Dills, [place], [place] [facility])  Urologist: Gean Quint    Assessment/Plan:    67 y.o.M with alcoholic cirrhosis, HTN, and h/o pT3a ccRCC s/p 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 He has progressed through Sunitnib, Nivolumab + Ipilimumab via OMNIVORE clinical trial, and is now on treatment via Cabozantinb 20mg .       1. ccRCC: On treatment via Cabozantinib 40mg  (started at 40 mg given liver disease)  ?? Previously treated with Sunitnib (07/27/2016-12/03/2016), Nivolumab + Ipilimumab + Nivo via OMNIVORE clinical trial (01/21/2017-05/29/2017)  ?? Cabozantinib 40mg  (07/08/2017-present). Dose reduced 20mg  given constellation of SE's including PPE, fatigue, HTN, mild transaminitis. He has been tolerating 20mg  well.   ?? Previous imaging from 04/2018, with overall disease stability, but there was some mild increase in paraortic nodes. We elected to continue treatment and imaging again in 8 weeks which we reviewed today reporting all over mild growth, as well as an increase in peritoneal thickening consistent with interval disease growth. I think with overall growth over the last 4 months, in conjunction with dose reduced cabozantinib lends to transitioning treatment at this time. We will review images in tumor board, but we also discussed that if treatment change is recommended, Lenvatinib + everoliums is reasonable. He has seen 2 TKI's and immunotherapy, but he has never been treated with a mTOR inhibitor. The other possibility is Axitinib with the option to add pembrolizumab. I will discuss with Dr. Okey Dupre and we will bring patient back in for treatment discussion       2. Cirrhosis: Has intermittent baseline leukopenia and thrombocytopia, although reports no episodes of decompensated cirrhosis and currently in normal range. Stable and well compensated.   ?? Avoids tylenol    3. HTN: better controled. Currently on amlodipine 10mg  and losartan 25mg .  ?? Home readings 130-140's/80's  ?? Discontinued lisinopril previously due to cough     4. Asymmetric LE edema: No DVT per recent doppler    5. Elevated TSH: On synthroid, follows with PCP    6. Ventral hernia: easily reduces. Advised wearing abdominal binder if is becomes bothersome. He has met with  Dr. Carlynn Purl and will follow up with her after imaging has been completed in October. To note, if he proceeds with surgery, he will need to HOLD cabozantinib for 28 days prior to surgery and he can restart once healed and risk of bleeding has passed. Given disease growth, I would depolarize this at this time.       7. Elevated sCr: stable; CTM     8. GERD: prilosec    9. Health Maintenance: Influenza given    10. PPE --no blisters, sores, peeling, noted today   ?? Continue urea cream    11. Weight loss: He has lost 15lbs since Jan. He reports no appetite changes, and he has intentionally tried to lose weight in preparation for hernia surgery.   * To Note- If he proceeds with hernia surgery, he will need to hold cabozantinib 28 days prior to surgery.       Plan:  ?? Discuss treatment options with Dr. Okey Dupre after images are reviewed.   ?? Plan to bring patient back to clinic to discuss treatment recommendations.   ?? He will discontinue cabozantinib  ?? He understands to call with any issues.     Greater than 50% of today's 30 minute office visit was dedicated to face-to-face counseling  on test results, diagnosis, treatment and prognosis-- [name], FNP- BC    History of Present Illness:    Sergio Horton is a 67 y.o. male with h/o right nephrectomy and caval thrombectomy for ccRCC who is seen in follow-up for locally recurrent RCC, on treatment via cabozantinib.     He originally presented in 2015 with a large right renal mass. He underwent nephrectomy in 06/2014 and pathology showed a 11.5cm grade 2 ccRCC with invasion into the perirenal adipose tissue (pT3a). He also had a retrocaval mass resected that also was ccRCC. Post-op course was complicated by respiratory failure requiring ECMO. He was in the hospital for a month after his surgery. Routine imaging demonstrated likely local recurrence and biopsy confirmed RCC. He initiated sunitinib on 07/27/16 and dose reduced 08/22/16 due to neutropenia, restarted 09/03/16, the dose reduced again for HFS. CT on 12/03/16 showed POD and sunitinib was stopped.  He transitioned to treatment via OMNIVORE trial (01/07/2017-06/24/2017), that utilized single agent nivolumab, until POD was noted, then he received 2 cycles of Ipi + Nivo. Repeat imaging reported POD.     Given POD he started Cabozantinib on 07/03/2017.     Interval History:  The patient is here in follow up on cabozantinib. He continues to do well. PPE has improved. No new areas. He denies any focal pain, no diarrhea. He continues to work. His appetite and weight are stable.     Past Medical History:  Past Medical History:   Diagnosis Date   ??? Cirrhosis of liver (CMS-HCC)    ??? Hypertension    ??? Hypothyroidism    ??? Renal cancer (CMS-HCC)    ??? Thrombocytopathia (CMS-HCC)    Alcoholic Cirrhosis - denies any ascites or h/o encephalopathy  HTN   No history of autoimmune disorder.    Medications:    Current Outpatient Medications on File Prior to Visit   Medication Sig Dispense Refill   ??? amLODIPine (NORVASC) 10 MG tablet Take 1 tablet (10 mg total) by mouth daily. 90 tablet 6   ??? B complex-vitamin C-folic acid (RENA-VITE) 0.8 mg Tab Take 1 tablet daily 30 tablet 12   ??? cabozantinib 20 mg Tab TAKE 1 TABLET BY MOUTH ONCE DAILY AT 6:00 AM 30 each 5   ??? diclofenac sodium (VOLTAREN) 1 % gel Apply 2 g topically every six (6) hours as needed for arthritis. (Patient not taking: Reported on 04/30/2018) 100 g 0   ??? levothyroxine (SYNTHROID, LEVOTHROID) 112 MCG tablet TK 1 T PO QAM ON AN EMPTY STOMACH  1   ??? losartan (COZAAR) 25 MG tablet Take 1 tablet (25 mg total) by mouth daily. 90 tablet 3   ??? omeprazole (PRILOSEC) 20 MG capsule TAKE 1 CAPSULE BY MOUTH ONCE DAILY 90 capsule 0   ??? urea (CARMOL) 40 % cream Apply twice daily to sore areas on hands and feet 198.6 g 1   ??? [DISCONTINUED] lisinopril (PRINIVIL,ZESTRIL) 10 MG tablet Take 1 tablet (10 mg total) by mouth daily. 90 tablet 3     No current facility-administered medications on file prior to visit.      Allergies:  No Known Allergies    Social History:  Lives in [place] with wife. He has a h/o cirrhosis from alcohol use but does not currently use alcohol. No tobacco use. No other drugs. Works as a Financial risk analyst.    Review of Systems: A 10 system review was completed and was negative except per HPI.    Physical Exam:  Vitals:    07/09/18 1035   BP: 151/73   Pulse: 51   Resp: 18   Temp: 36.4 ??C (97.5 ??F)   SpO2: 97%     ECOG PS 1    General:   No acute distress, alert and interactive   Eyes:    Extra ocular muscles intact.  Sclera not icteric.   ENT:  Oropharynx clear.   Neck:  Supple   Lymph Nodes:  No cervical adenopathy   Cardiovascular:  RRR without murmurs, rubs, gallops   Lungs:  Clear to auscultation bilaterally, without wheezes/crackles/rhonchi.   Skin:    Healing hand blisters, no other rash.    Abdomen:   Abdomen soft, not tender and not distended, +incisional hernia, well-healed incision   Extremities:   Warm and well-perfused with L>R edema.   Neurological:  Alert and oriented to person, place and time.     Labs:  Reviewed in Epic    Imaging:    05/25/16 CT CAP:  -- Status post right nephrectomy.  -- Irregular soft tissue density foci with peripheral enhancement in the right nephrectomy bed and extending inferiorly along the psoas muscle, concerning for recurrent malignancy.  -- Aortocaval lymphadenopathy.    07/04/16 Bone scan  -No evidence of metastatic osseous disease.    07/20/16 Echo:  ?? Normal left ventricular systolic function, ejection fraction > 55%  ?? Diastolic dysfunction - grade I (normal filling pressures)  ?? Normal right ventricular systolic function      09/24/16 CT CAP:  -- Status post right nephrectomy. There has been interval response to therapy, with decreasing size of recurrent tumor within the nephrectomy bed, and decreasing aortocaval nodal tissue.  -- Unchanged ultrasound morphology of liver compatible with history of cirrhosis.    12/03/16 CT CAP:  -Worsening metastatic disease in the right nephrectomy bed with enlarging aortocaval nodes/nodules and new perihepatic implant.    For reference pericaval nodes/nodules now measure up to 2 cm, previously 1.5 cm (2:93). There is new enhancing lesion in/along the right psoas musculature measuring up to 1.8 cm (2:99-106).     03/05/2017 CT CAP s/p 4C Nivo on Omnivore:  - Increase appearance of metastatic implants along the inferior right hepatic lobe. Increased size of nodularity in the right nephrectomy and increased pericaval node/nodule compatible with worsening metastatic disease.  --Increased size of pericaval nodes/nodules measure up to 2.4 cm (2:93), previously 2.0 cm  --Slightly increased size of nodularity in the right nephrectomy bed measuring 3.6 by 3.4 x 2.0 cm (2:90)  -Small-volume ascites.      06/24/2017 CT CAP  -Progressive disease with increasing nephrectomy bed soft tissue, adjacent retroperitoneal adenopathy, right psoas/paraspinal soft tissue implants, and posterior perihepatic implant. No evidence of metastatic disease in the chest.  -Small amount of nonspecific fluid/small collection adjacent to the ventral abdominal hernia sac on the right measuring 1.6 cm. ??This appears increased compared to 04/02/17 but similar to 03/05/2017.  ????-Mild circumferential bladder wall thickening which may be due to contraction versus cystitis. Recommend correlation with urinalysis.  -Cirrhotic liver morphology.    09/11/2017 CT CAP s/p 8 weeks Cabozantinib  1. Stable to slight interval decrease in necrotic metastatic implants in the right renal fossa and necrotic aortocaval adenopathy. Stable perihepatic implant.   **Centrally necrotic 2.4 x 2.1 cm mass abutting and likely invading the adjacent right psoas muscle. This is not significantly changed from prior examination which time it measured 2.5 x 2.3 cm on remeasurement.   **3.1 x 1.9 cm  necrotic mass (1:37), previously previously 3.8 x 2.2 cm.   **Aortocaval adenopathy measures up to 2.6 cm in short axis, previously 2.8 cm (1:30). A more inferior aortocaval node measures 2.5 cm, previously 2.7 cm (1:44).   2. Small amount of fluid along the right lateral aspect of the small and large bowel containing ventral hernia, nonspecific. No evidence of obstruction at the hernia site.-- he has seen a general surgeon recently regarding possible repair  3. Cirrhotic liver with small caliber gastrohepatic and esophageal varices.  4. No evidence of thoracic metastatic disease.    12/03/2017 CT CAP on Cabozantinib  1. Stable to slight interval decrease in necrotic metastatic implants in the right renal fossa and necrotic aortocaval adenopathy. Stable perihepatic implant.  2. Small amount of fluid along the right lateral aspect of the small and large bowel containing ventral hernia, nonspecific. No evidence of obstruction at the hernia site.  3. Cirrhotic liver with small caliber gastrohepatic and esophageal varices.  4. No evidence of thoracic metastatic disease.    CT CAP 04/30/2018  IMPRESSION:  Since 02/26/2018:  -Mildly increased size of necrotic peritoneal implants and retroperitoneal nodes with invasion into the right psoas and through the posterior abdominal wall and into the paraspinal musculature as described above.  -Focal small bowel thickening within the pelvis that is new. This may represent focal enteritis versus focal lesion. Close attention on follow-up.   -No other sites of disease identified.  -There is new small volume ascites which is likely related to cirrhosis and portal hypertension as evidenced by small caliber paraesophageal and upper abdominal varices.    Imaging reviewed from today; 07/09/2018; CT CAP  --Status post right nephrectomy.  ??  --Mild interval increase in size of right retroperitoneal metastatic implants as above.  ??  --Large anterior abdominal wall bowel-containing hernia with multiloculated rim enhancing pockets of fluid, some of them only slightly decreased but overall grossly unchanged from prior.    --LYMPH NODES: Interval progression of centrally necrotic extracapsular implant posterior to the hepatic segment 6, measuring 1.2 x 2.2 cm (previously 1.2 x 1.9 cm) (1:43).  Mildly increased right retroperitoneal centrally necrotic paraspinal and aortocaval metastases, as a reference:  -Right paraspinal lesion medial to the caudate lobe with invasion of the psoas muscle, intercostal muscle and paraspinal muscles, orbital measuring 6.8 x 2.9 cm (previously 6.0 x 2.6 cm) (1:38).   -Implant within the right psoas measures 1.8 x 2.2 cm (previously 1.7 x 2.4 cm).  -2.8 x 4.5 cm, aortocaval, previously 2.4 x 4.0 cm (1:55). The adjacent IVC is displaced laterally.    Pathology:    06/29/14:  Diagnosis:  A: ??Liver, segment 5, core needle biopsy  - Cirrhosis, etiology uncertain (see Light Microscopy) ??    B: ??Kidney and adrenal, right, radical nephrectomy, adrenalectomy, and caval  thrombectomy   Tumor histologic type/subtype: ??clear cell carcinoma  Sarcomatoid features:?????????? not identified   Histologic grade: Grade 2 (of 4) Fuhrman classification??????????  Tumor size: 11.5 cm diameter   Tumor focality: unifocal     Extent of invasion:??????????  ???? ?? Extra-capsular invasion into perirenal adipose tissue: not identified  ???? ?? Gerota's fascia: not involved   ???? ?? Renal sinus: not involved ??????????  ???? ?? Major veins (renal vein or segmental branches, IVC): involved (macro and  micro)  ???? ?? Ureter: not involved   ???? ?? Venous: ??involved   ???? ?? Lymphatic:??????????not involved   ???? ??   Histologic assessment of surgical margins:??????????  ???? ??  Peri-nephric adipose tissue margin (partial nephrectomy only): negative   ???? ?? Gerota's fascia: negative   ???? ?? Renal vein margin:??????????no adherent carcinoma identified on initial or  recuts of en face margin (B2) ??  ???? ?? Ureter margin: ??negative   Adrenal gland:?????????? negative   Lymph nodes:??????????none received     Pathologic findings in non-neoplastic kidney: focal global glomerulosclerosis   AJCC Stage (renal primary): ??pT3b (intrahepatic caval involvement) ?? pNx ?? pMx    C: ??Mass, retrocaval, excision   - Renal clear cell carcinoma, partially encapsulated, presumed confined by  Gerota's fascia, extending to uninked tissue edges    06/27/16:   Right nephrectomy bed, core biopsy and touch preparation:  - Diagnostic of malignancy.  - Morphology consistent with recurrence of patient's known clear cell renal cell carcinoma (Grade 2 of 4 Fuhrman Nuclear Grade).

## 2018-07-09 NOTE — Unmapped (Signed)
Patient labs drawn and sent for analysis. Care per T Valentino Saxon

## 2018-07-09 NOTE — Unmapped (Addendum)
Plan:  We discussed imaging and overall interval progression in disease and discussed possible treatment options including    Lenvatinib + Everolims  Axitinib with the possibility of adding on Pembrolizumab Rande Lawman)    Lab Results   Component Value Date    WBC 5.3 07/09/2018    HGB 13.2 (L) 07/09/2018    HCT 40.8 (L) 07/09/2018    PLT 190 07/09/2018       Lab Results   Component Value Date    NA 134 (L) 07/09/2018    K 4.2 07/09/2018    CL 104 07/09/2018    CO2 22.0 07/09/2018    BUN 12 07/09/2018    CREATININE 0.99 07/09/2018    CALCIUM 8.6 07/09/2018    MG 1.9 04/30/2018    PHOS 3.5 07/19/2014       Lab Results   Component Value Date    PT 12.4 12/24/2016    INR 1.06 12/24/2016    APTT 31.2 12/24/2016

## 2018-07-24 MED ORDER — EVEROLIMUS (ANTINEOPLASTIC) 10 MG TABLET
ORAL_TABLET | Freq: Every day | ORAL | 3 refills | 0 days | Status: CP
Start: 2018-07-24 — End: 2018-07-29

## 2018-07-24 MED ORDER — LENVATINIB 18 MG/DAY (10 MG X 1 AND 4 MG X 2) CAPSULE
ORAL_CAPSULE | Freq: Every day | ORAL | 3 refills | 0.00000 days | Status: CP
Start: 2018-07-24 — End: 2018-12-10
  Filled 2018-07-30: qty 90, 30d supply, fill #0

## 2018-07-28 NOTE — Unmapped (Signed)
[facility] Specialty Medication Referral: PRIOR AUTH AND FINANCIAL ASSISTANCE APPROVED      Medication (Brand/Generic): AFINITOR    Final Test Claim completed with resulted information below:    Patient ABLE to fill at [facility]  Insurance Company:  [employer]UM  Anticipated Copay: $0  Is anticipated copay with a copay card or grant? YES    Does patient's insurance plan only allow a 15 day supply for the first 6 fills in the [facility]? NO  If yes, inform patient they can request to dis-enroll from the [facility] by calling the patient help desk at N/A.      If the copay is under the $25 defined limit, per policy there will be no further investigation of need for financial assistance at this time unless patient requests. This referral has been communicated to the provider and handed off to the [facility] team for further processing and filling of prescribed medication.   ______________________________________________________________________  Please utilize this referral for viewing purposes as it will serve as the central location for all relevant documentation and updates.

## 2018-07-28 NOTE — Unmapped (Signed)
-----   Message from [facility] [place] sent at 07/28/2018  9:58 AM EST -----  Regarding: RE: relay of info  He should stop cabo now-- BB    Thanks  ----- Message -----  From: Elijah Birk, RN  Sent: 07/25/2018   9:57 AM EST  To: Kennieth Francois, FNP  Subject: Annell Greening: relay of info                                Thermon Leyland,  Does he need to stop cabozantinib now or waint when he gets new medication?  Soraya  ----- Message -----  From: Kennieth Francois, FNP  Sent: 07/24/2018   3:39 PM EST  To: Elijah Birk, RN  Subject: relay of info                                    Janalyn Harder- will you let Mr. Kilmer know that after much discussion we are recommending Lenvatinib + everolimus. Prescriptions have been sent to shared services, and we will see him 2 weeks after starting. Will make an appt for approx 3 weeks from now    Thanks-- BB

## 2018-07-28 NOTE — Unmapped (Signed)
Spoke with pt and let him know to stop his cabozantinib now. He will start his Lenvatinib and Evirolimus as soon as he receives it.

## 2018-07-28 NOTE — Unmapped (Signed)
[facility] Specialty Medication Referral: PRIOR AUTH AND FINANCIAL ASSISTANCE APPROVED     Medication (Brand/Generic): LENVIMA    Final Test Claim completed with resulted information below:    Patient ABLE to fill at [facility]  Insurance Company:  OPTUM  Anticipated Copay: $0  Is anticipated copay with a copay card or grant? YES    Does patient's insurance plan only allow a 15 day supply for the first 6 fills in the [facility]? NO  If yes, inform patient they can request to dis-enroll from the [facility] by calling the patient help desk at N/A.      If the copay is under the $25 defined limit, per policy there will be no further investigation of need for financial assistance at this time unless patient requests. This referral has been communicated to the provider and handed off to the [facility] team for further processing and filling of prescribed medication.   ______________________________________________________________________  Please utilize this referral for viewing purposes as it will serve as the central location for all relevant documentation and updates.

## 2018-07-29 MED ORDER — EVEROLIMUS (ANTINEOPLASTIC) 5 MG TABLET: 5 mg | tablet | Freq: Every day | 3 refills | 0 days | Status: AC

## 2018-07-29 MED ORDER — EVEROLIMUS (ANTINEOPLASTIC) 5 MG TABLET
ORAL_TABLET | Freq: Every day | ORAL | 3 refills | 0.00000 days | Status: CP
Start: 2018-07-29 — End: 2018-07-29
  Filled 2018-07-30: qty 28, 28d supply, fill #0

## 2018-07-29 NOTE — Unmapped (Signed)
Clinical Pharmacist Practitioner: GU Oncology Clinic Telephone Encounter    New Start Everolimus/Lenvatinib Education    Sergio Horton is a 68 y.o. male with mRCC who I am counseling today on initiation of oral targeted therapy.    Oral targeted therapy regimen: Everolimus 5 mg daily and lenvatinib 18 mg daily  Tentative Start Date: 08/01/18    Side effects discussed included but were not limited to: mucositis, diarrhea, edema, hyperglycemia, increased LDL and TG, bone marrow suppression, increased risk of infection, nephrotoxicity, HTN, hand foot syndrome, N/V, lyte abnormalities, bleeding, cardiac events, thrombotic events and pneumonitis. Side effect prevention and management were also reviewed including management of mucositis and laboratory monitoring.    Administration: I reviewed the importance of taking with a full glass of water and consistently with regard to food, as well as the importance of medication adherence.     Drug Interactions: Instructed the patient that this medication has potential drug interactions. The patient should inform me of any new prescriptions so that I may evaluate for potential drug interactions.other medications reviewed and up to date in Epic.  No drug interactions identified.    Storage requirements: this medicine should be stored at room temperature.     Handling precautions reviewed:  Patient was counseled on the hazards surrounding oral chemotherapy and will minimize contact/exposure to the medicine.    Comorbidities/Allergies: reviewed and up to date in Epic.    Handout provided: Chemotherapy handout from [facility]    Patient verbalized understanding of the above information as well as how to contact the Team with any questions/concerns.    Time with patient: 10 min    Laverna Peace PharmD, BCOP, CPP  Hematology/Oncology Pharmacist  P: [phone]    Next Scheduled Delivery Date: 07/31/18 from [facility]   Confirmed Shipping address: [address]  [place] [address]    Financial/Shipment   Primary Billing: Medicare  Secondary Billing [name], Medicaid, CoPay Card, Secondary Commercial): [employer]  Anticipated copay of $0 reviewed with patient (See full details under Referrals tab in EPIC).    Verified delivery address in FSI and reviewed medication storage requirement.    The following was explained to the patient:  Advised patient of the following:  -A Welcome packet will be sent to the patient   -Assignment of Benefit for the patient to review and return before next refill  -Arrangement of payment method can be done by contacting the pharmacy  -Take medications with during travel, have doctor's appointments, or if being admitted to the hospital.    Advised patient of refill order process:  Specialty pharmacy process for medication shipment was also reviewed.  Discussed the service provided by [facility] with respects to monthly outbound calls to the patient to set up refill deliveries 7-10 days prior to their subsequent needed refill.  Emphasized need for patient to be reachable in order to schedule medication shipment and that shipment will be shipped to the deliverable address provided via UPS.  Informed patient that welcome packet will be sent.        Provided the [facility] pharmacy contact information below:  [facility] [phone], option 4)    Patient Specific   The following items were reviewed and no issues were identified:  Complete medication list, PMH, allergies    Relevant cultural assessment and health literacy was completed as below:  Patient speaks  [place], has no other identified physical or cognitive barriers and is able to store  his/her medication as directed.  Patient prefers to have medications discussed with  Patient.  Patient is able to read and understand education materials at a high school level or above.

## 2018-07-30 MED FILL — AFINITOR 5 MG TABLET: 28 days supply | Qty: 28 | Fill #0 | Status: AC

## 2018-07-30 MED FILL — LENVIMA 18 MG/DAY (10 MG X 1 AND 4 MG X 2) CAPSULE: 30 days supply | Qty: 90 | Fill #0 | Status: AC

## 2018-08-06 ENCOUNTER — Other Ambulatory Visit: Admit: 2018-08-06 | Discharge: 2018-08-07 | Payer: MEDICARE

## 2018-08-06 ENCOUNTER — Ambulatory Visit: Admit: 2018-08-06 | Discharge: 2018-08-07 | Payer: MEDICARE | Attending: Family | Primary: Family

## 2018-08-06 DIAGNOSIS — I10 Essential (primary) hypertension: Secondary | ICD-10-CM

## 2018-08-06 DIAGNOSIS — J111 Influenza due to unidentified influenza virus with other respiratory manifestations: Secondary | ICD-10-CM

## 2018-08-06 DIAGNOSIS — C649 Malignant neoplasm of unspecified kidney, except renal pelvis: Secondary | ICD-10-CM

## 2018-08-06 DIAGNOSIS — C779 Secondary and unspecified malignant neoplasm of lymph node, unspecified: Principal | ICD-10-CM

## 2018-08-06 LAB — COMPREHENSIVE METABOLIC PANEL
ALBUMIN: 3.6 g/dL (ref 3.5–5.0)
ALKALINE PHOSPHATASE: 80 U/L (ref 38–126)
ALT (SGPT): 22 U/L (ref <50)
AST (SGOT): 41 U/L (ref 19–55)
BILIRUBIN TOTAL: 0.5 mg/dL (ref 0.0–1.2)
BLOOD UREA NITROGEN: 12 mg/dL (ref 7–21)
BUN / CREAT RATIO: 10
CALCIUM: 9.1 mg/dL (ref 8.5–10.2)
CHLORIDE: 104 mmol/L (ref 98–107)
CO2: 21 mmol/L — ABNORMAL LOW (ref 22.0–30.0)
CREATININE: 1.17 mg/dL (ref 0.70–1.30)
EGFR CKD-EPI AA MALE: 73 mL/min/{1.73_m2} (ref >=60)
EGFR CKD-EPI NON-AA MALE: 63 mL/min/{1.73_m2} (ref >=60)
GLUCOSE RANDOM: 102 mg/dL (ref 70–179)
POTASSIUM: 4 mmol/L (ref 3.5–5.0)
PROTEIN TOTAL: 6.7 g/dL (ref 6.5–8.3)
SODIUM: 135 mmol/L (ref 135–145)

## 2018-08-06 LAB — PROTEIN TOTAL: Protein:MCnc:Pt:Ser/Plas:Qn:: 6.7

## 2018-08-06 LAB — SMEAR REVIEW

## 2018-08-06 LAB — CBC W/ AUTO DIFF
BASOPHILS ABSOLUTE COUNT: 0 10*9/L (ref 0.0–0.1)
BASOPHILS RELATIVE PERCENT: 0.1 %
EOSINOPHILS ABSOLUTE COUNT: 0.1 10*9/L (ref 0.0–0.4)
EOSINOPHILS RELATIVE PERCENT: 2.1 %
HEMATOCRIT: 42 % (ref 41.0–53.0)
HEMOGLOBIN: 13.2 g/dL — ABNORMAL LOW (ref 13.5–17.5)
LYMPHOCYTES ABSOLUTE COUNT: 1.3 10*9/L — ABNORMAL LOW (ref 1.5–5.0)
LYMPHOCYTES RELATIVE PERCENT: 28.2 %
MEAN CORPUSCULAR HEMOGLOBIN CONC: 31.5 g/dL (ref 31.0–37.0)
MEAN CORPUSCULAR HEMOGLOBIN: 32.8 pg (ref 26.0–34.0)
MEAN CORPUSCULAR VOLUME: 104.1 fL — ABNORMAL HIGH (ref 80.0–100.0)
MEAN PLATELET VOLUME: 7.6 fL (ref 7.0–10.0)
MONOCYTES ABSOLUTE COUNT: 0.5 10*9/L (ref 0.2–0.8)
MONOCYTES RELATIVE PERCENT: 10 %
NEUTROPHILS ABSOLUTE COUNT: 2.6 10*9/L (ref 2.0–7.5)
NEUTROPHILS RELATIVE PERCENT: 57.4 %
PLATELET COUNT: 195 10*9/L (ref 150–440)
RED BLOOD CELL COUNT: 4.03 10*12/L — ABNORMAL LOW (ref 4.50–5.90)
WBC ADJUSTED: 4.5 10*9/L (ref 4.5–11.0)

## 2018-08-06 LAB — MAGNESIUM: Magnesium:MCnc:Pt:Ser/Plas:Qn:: 1.6

## 2018-08-06 LAB — LACTATE DEHYDROGENASE: Lactate dehydrogenase:CCnc:Pt:Ser/Plas:Qn:: 525

## 2018-08-06 LAB — ANISOCYTOSIS

## 2018-08-06 NOTE — Unmapped (Signed)
Genitourinary Oncology Clinic Follow-Up Note    Patient Name: Sergio Horton  Encounter Date: 08/06/2018    Referring Physician: Renford Dills, [place], [facility])  Urologist: Gean Quint    Assessment/Plan:    68 y.o.M with alcoholic cirrhosis, HTN, and h/o pT3a ccRCC s/p 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 He has progressed through Sunitnib, Nivolumab + Ipilimumab via OMNIVORE clinical trial, Cabozantinib, and has now started on treatment with Lenvatinib + everolimus.        1. ccRCC: on treatment with Lenvtinib + Everolimus (1/2/20120-present)    ?? Previous Treatments: Sunitnib (07/27/2016-12/03/2016), Nivolumab + Ipilimumab + Nivo via OMNIVORE clinical trial (01/21/2017-05/29/2017), and Cabozantinib (07/08/2017-07/22/2018)     ?? Previous imaging from 04/2018, with overall disease stability, but mild enlargement in paraortic nodes. We elected to continue treatment on cabozantinib given good tolerability and only small amount of growth, with plan to repeat imaging in 8 weeks. His 8 week interval scans were reviewed at his last visit and unfortunately, the scans continued to demonstrate growth, as well as, increased peritoneal thickening. We did review imaging in tumor board and it was agreed that his disease was progressing on imaging. Therefore we recommended treatment change to Lenvatinib + everoliums, as he has seen 2 TKI's and immunotherapy, but has never been treated with a mTOR inhibitor. He started the agents approx 10 days ago (07/24/2018) and returns today with no reportable SE's related to treatment      2. Cirrhosis: Has intermittent baseline leukopenia and thrombocytopia, although reports no episodes of decompensated cirrhosis and currently in normal range. Stable and well compensated.   ?? Avoids tylenol    3. HTN: better controled. Currently on amlodipine 10mg  and losartan 25mg .  ?? Home readings 130-140's/80's  ?? Discontinued lisinopril previously due to cough     4. Asymmetric LE edema: No DVT per recent doppler    5. Elevated TSH: On synthroid, follows with PCP    6. Ventral hernia: easily reduces. Advised wearing abdominal binder if is becomes bothersome. He has met with  Dr. Carlynn Purl and will follow up with her after imaging has been completed in October. Given disease growth, I would depolarize this at this time.     7. Elevated sCr: stable; CTM     8. GERD: prilosec    9. Health Maintenance: Influenza given 04/2018    10. PPE --no blisters, sores, peeling, noted today   ?? Continue urea cream    11. Diarrhea: reports significant diarrhea on Sat. But has resolved at this time. Unlikely related to treatment, but will follow closely, and educated on imodium use.     Plan:  ?? Continue Lenvatinib + Everolimus; Pharmacy Council provided today  ?? RTC in 2 weeks with labs. Will plan to see every 2 weeks x 8 weeks, then monthly if stable.   ?? Imodium for diarrhea     Greater than 50% of today's 30 minute office visit was dedicated to face-to-face counseling on test results, diagnosis, treatment and prognosis-- [name], FNP- BC    History of Present Illness:    Sergio Horton is a 68 y.o. male with h/o right nephrectomy and caval thrombectomy for ccRCC who is seen in follow-up for locally recurrent RCC, on treatment via cabozantinib.     He originally presented in 2015 with a large right renal mass. He underwent nephrectomy in 06/2014 and pathology showed a 11.5cm grade 2 ccRCC with invasion into the perirenal adipose tissue (pT3a). He also had a retrocaval mass resected  that also was ccRCC. Post-op course was complicated by respiratory failure requiring ECMO. He was in the hospital for a month after his surgery. Routine imaging demonstrated likely local recurrence and biopsy confirmed RCC. He initiated sunitinib on 07/27/16 and dose reduced 08/22/16 due to neutropenia, restarted 09/03/16, the dose reduced again for HFS. CT on 12/03/16 showed POD and sunitinib was stopped.  He transitioned to treatment via OMNIVORE trial (01/07/2017-06/24/2017), that utilized single agent nivolumab, until POD was noted, then he received 2 cycles of Ipi + Nivo. Repeat imaging reported POD.     Given POD on IO, he started Cabozantinib on 07/03/2017. To note he was started on 40mg  given significant liver disease, and was dose reduced to 20mg  after approx 6mon on treatment 2/2 persistent SE's including PPE, HTN, fatigue, appetite loss, and intermittent elevation in LFTs. He did well on 20mg , but most recent imaging from 06/2018 reported overall disease growth.     Lenvtinib + everolimus was recommended after imaging review in tumor board. He was able to start this regimen 07/24/2018.     Interval History:  Chills started yesterday, but no subjective fevers and afebrile in office today  Diarrhea started Saturday, 1 day after initiating Lenvatinib + everolimus, so likely not treatment related. It has resolved at this time.  Appetite is fair   His energy level remains low today  No new pain  No blisters noted to hands/feet. He is using urea cream       Past Medical History:  Past Medical History:   Diagnosis Date   ??? Cirrhosis of liver (CMS-HCC)    ??? Hypertension    ??? Hypothyroidism    ??? Renal cancer (CMS-HCC)    ??? Thrombocytopathia (CMS-HCC)    Alcoholic Cirrhosis - denies any ascites or h/o encephalopathy  HTN   No history of autoimmune disorder.    Medications:    Current Outpatient Medications on File Prior to Visit   Medication Sig Dispense Refill   ??? amLODIPine (NORVASC) 10 MG tablet Take 1 tablet (10 mg total) by mouth daily. 90 tablet 6   ??? B complex-vitamin C-folic acid (RENA-VITE) 0.8 mg Tab Take 1 tablet daily 30 tablet 12   ??? everolimus, antineoplastic, (AFINITOR) 5 mg tablet Take 1 tablet (5 mg total) by mouth daily. 30 tablet 3   ??? lenvatinib 18 mg/day (10 mg x 1-4 mg x2) cap Take one 10mg  capsule and two 4mg  capsules (18 mg total) by mouth daily. 360 capsule 3   ??? losartan (COZAAR) 25 MG tablet Take 1 tablet (25 mg total) by mouth daily. 90 tablet 3   ??? omeprazole (PRILOSEC) 20 MG capsule TAKE 1 CAPSULE BY MOUTH ONCE DAILY 90 capsule 0   ??? diclofenac sodium (VOLTAREN) 1 % gel Apply 2 g topically every six (6) hours as needed for arthritis. (Patient not taking: Reported on 04/30/2018) 100 g 0   ??? levothyroxine (SYNTHROID, LEVOTHROID) 112 MCG tablet TK 1 T PO QAM ON AN EMPTY STOMACH  1   ??? urea (CARMOL) 40 % cream Apply twice daily to sore areas on hands and feet 198.6 g 1   ??? [DISCONTINUED] lisinopril (PRINIVIL,ZESTRIL) 10 MG tablet Take 1 tablet (10 mg total) by mouth daily. 90 tablet 3     No current facility-administered medications on file prior to visit.      Allergies:  No Known Allergies    Social History:  Lives in [place] with wife. He has a h/o cirrhosis from alcohol use but  does not currently use alcohol. No tobacco use. No other drugs. Works as a Financial risk analyst.    Review of Systems: A 10 system review was completed and was negative except per HPI.    Physical Exam:  Vitals:    08/06/18 0845   BP: (S) 154/92   Pulse: 65   Resp: 16   Temp: 36.5 ??C (97.7 ??F)   SpO2: 99%     ECOG PS 1    General:   No acute distress, alert and interactive   Eyes:    Extra ocular muscles intact.  Sclera not icteric.   ENT:  Oropharynx clear.   Neck:  Supple   Lymph Nodes:  No cervical adenopathy   Cardiovascular:  RRR without murmurs, rubs, gallops   Lungs:  Clear to auscultation bilaterally, without wheezes/crackles/rhonchi.   Skin:    Healing hand blisters, no other rash.    Abdomen:   Abdomen soft, not tender and not distended, +incisional hernia, well-healed incision   Extremities:   Warm and well-perfused with L>R edema.   Neurological:  Alert and oriented to person, place and time.     Labs:  Reviewed in Epic    Imaging:    05/25/16 CT CAP:  -- Status post right nephrectomy.  -- Irregular soft tissue density foci with peripheral enhancement in the right nephrectomy bed and extending inferiorly along the psoas muscle, concerning for recurrent malignancy.  -- Aortocaval lymphadenopathy.    07/04/16 Bone scan  -No evidence of metastatic osseous disease.    07/20/16 Echo:  ?? Normal left ventricular systolic function, ejection fraction > 55%  ?? Diastolic dysfunction - grade I (normal filling pressures)  ?? Normal right ventricular systolic function      09/24/16 CT CAP:  -- Status post right nephrectomy. There has been interval response to therapy, with decreasing size of recurrent tumor within the nephrectomy bed, and decreasing aortocaval nodal tissue.  -- Unchanged ultrasound morphology of liver compatible with history of cirrhosis.    12/03/16 CT CAP:  -Worsening metastatic disease in the right nephrectomy bed with enlarging aortocaval nodes/nodules and new perihepatic implant.    For reference pericaval nodes/nodules now measure up to 2 cm, previously 1.5 cm (2:93). There is new enhancing lesion in/along the right psoas musculature measuring up to 1.8 cm (2:99-106).     03/05/2017 CT CAP s/p 4C Nivo on Omnivore:  - Increase appearance of metastatic implants along the inferior right hepatic lobe. Increased size of nodularity in the right nephrectomy and increased pericaval node/nodule compatible with worsening metastatic disease.  --Increased size of pericaval nodes/nodules measure up to 2.4 cm (2:93), previously 2.0 cm  --Slightly increased size of nodularity in the right nephrectomy bed measuring 3.6 by 3.4 x 2.0 cm (2:90)  -Small-volume ascites.      06/24/2017 CT CAP  -Progressive disease with increasing nephrectomy bed soft tissue, adjacent retroperitoneal adenopathy, right psoas/paraspinal soft tissue implants, and posterior perihepatic implant. No evidence of metastatic disease in the chest.  -Small amount of nonspecific fluid/small collection adjacent to the ventral abdominal hernia sac on the right measuring 1.6 cm. ??This appears increased compared to 04/02/17 but similar to 03/05/2017.  ????-Mild circumferential bladder wall thickening which may be due to contraction versus cystitis. Recommend correlation with urinalysis.  -Cirrhotic liver morphology.    09/11/2017 CT CAP s/p 8 weeks Cabozantinib  1. Stable to slight interval decrease in necrotic metastatic implants in the right renal fossa and necrotic aortocaval adenopathy. Stable perihepatic implant.   **  Centrally necrotic 2.4 x 2.1 cm mass abutting and likely invading the adjacent right psoas muscle. This is not significantly changed from prior examination which time it measured 2.5 x 2.3 cm on remeasurement.   **3.1 x 1.9 cm necrotic mass (1:37), previously previously 3.8 x 2.2 cm.   **Aortocaval adenopathy measures up to 2.6 cm in short axis, previously 2.8 cm (1:30). A more inferior aortocaval node measures 2.5 cm, previously 2.7 cm (1:44).   2. Small amount of fluid along the right lateral aspect of the small and large bowel containing ventral hernia, nonspecific. No evidence of obstruction at the hernia site.-- he has seen a general surgeon recently regarding possible repair  3. Cirrhotic liver with small caliber gastrohepatic and esophageal varices.  4. No evidence of thoracic metastatic disease.    12/03/2017 CT CAP on Cabozantinib  1. Stable to slight interval decrease in necrotic metastatic implants in the right renal fossa and necrotic aortocaval adenopathy. Stable perihepatic implant.  2. Small amount of fluid along the right lateral aspect of the small and large bowel containing ventral hernia, nonspecific. No evidence of obstruction at the hernia site.  3. Cirrhotic liver with small caliber gastrohepatic and esophageal varices.  4. No evidence of thoracic metastatic disease.    CT CAP 04/30/2018  IMPRESSION:  Since 02/26/2018:  -Mildly increased size of necrotic peritoneal implants and retroperitoneal nodes with invasion into the right psoas and through the posterior abdominal wall and into the paraspinal musculature as described above.  -Focal small bowel thickening within the pelvis that is new. This may represent focal enteritis versus focal lesion. Close attention on follow-up.   -No other sites of disease identified.  -There is new small volume ascites which is likely related to cirrhosis and portal hypertension as evidenced by small caliber paraesophageal and upper abdominal varices.    Imaging reviewed from today; 07/09/2018; CT CAP  --Status post right nephrectomy.  ??  --Mild interval increase in size of right retroperitoneal metastatic implants as above.  ??  --Large anterior abdominal wall bowel-containing hernia with multiloculated rim enhancing pockets of fluid, some of them only slightly decreased but overall grossly unchanged from prior.    --LYMPH NODES: Interval progression of centrally necrotic extracapsular implant posterior to the hepatic segment 6, measuring 1.2 x 2.2 cm (previously 1.2 x 1.9 cm) (1:43).  Mildly increased right retroperitoneal centrally necrotic paraspinal and aortocaval metastases, as a reference:  -Right paraspinal lesion medial to the caudate lobe with invasion of the psoas muscle, intercostal muscle and paraspinal muscles, orbital measuring 6.8 x 2.9 cm (previously 6.0 x 2.6 cm) (1:38).   -Implant within the right psoas measures 1.8 x 2.2 cm (previously 1.7 x 2.4 cm).  -2.8 x 4.5 cm, aortocaval, previously 2.4 x 4.0 cm (1:55). The adjacent IVC is displaced laterally.    Pathology:    06/29/14:  Diagnosis:  A: ??Liver, segment 5, core needle biopsy  - Cirrhosis, etiology uncertain (see Light Microscopy) ??    B: ??Kidney and adrenal, right, radical nephrectomy, adrenalectomy, and caval  thrombectomy   Tumor histologic type/subtype: ??clear cell carcinoma  Sarcomatoid features:?????????? not identified   Histologic grade: Grade 2 (of 4) Fuhrman classification??????????  Tumor size: 11.5 cm diameter   Tumor focality: unifocal     Extent of invasion:??????????  ???? ?? Extra-capsular invasion into perirenal adipose tissue: not identified  ???? ?? Gerota's fascia: not involved   ???? ?? Renal sinus: not involved ??????????  ???? ?? Major veins (renal vein or  segmental branches, IVC): involved (macro and  micro)  ???? ?? Ureter: not involved   ???? ?? Venous: ??involved   ???? ?? Lymphatic:??????????not involved   ???? ??   Histologic assessment of surgical margins:??????????  ???? ?? Peri-nephric adipose tissue margin (partial nephrectomy only): negative   ???? ?? Gerota's fascia: negative   ???? ?? Renal vein margin:??????????no adherent carcinoma identified on initial or  recuts of en face margin (B2) ??  ???? ?? Ureter margin: ??negative   Adrenal gland:?????????? negative   Lymph nodes:??????????none received     Pathologic findings in non-neoplastic kidney: focal global glomerulosclerosis   AJCC Stage (renal primary): ??pT3b (intrahepatic caval involvement) ?? pNx ?? pMx    C: ??Mass, retrocaval, excision   - Renal clear cell carcinoma, partially encapsulated, presumed confined by  Gerota's fascia, extending to uninked tissue edges    06/27/16:   Right nephrectomy bed, core biopsy and touch preparation:  - Diagnostic of malignancy.  - Morphology consistent with recurrence of patient's known clear cell renal cell carcinoma (Grade 2 of 4 Fuhrman Nuclear Grade).

## 2018-08-06 NOTE — Unmapped (Signed)
Influenza swab completed. Labs drawn and given to NN.

## 2018-08-06 NOTE — Unmapped (Addendum)
Instructions  1. For diarrhea start imodium (loperamide) 2 tablets (4 mg) at first bout of diarrhea, then take one tablet (2 mg) for additional loose stool, do not exceed 8 tablets per day  2. Continue Lenvatinib + everolimus  3. RTC in 2 weeks with labs        Patient Education        lenvatinib  Pronunciation:  len VA ti nib  Brand:  Lenvima  What is the most important information I should know about lenvatinib?  Some people taking lenvatinib have developed a perforation (a hole or tear) or a fistula (an abnormal passageway) within the stomach or intestines. Get emergency medical help if you have severe stomach pain, or if you feel like you are choking and gagging when you eat or drink.  Call your doctor at once if you have signs of serious side effects, including: severe chest pain, shortness of breath, swelling in your ankles, numbness or weakness, confusion, severe headache, problems with speech or vision, seizure (convulsions), unusual bleeding, coughing up blood, dark urine, clay-colored stools, or jaundice (yellowing of the skin or eyes).  What is lenvatinib?  Lenvatinib is used to treat thyroid cancer. Lenvatinib is usually given after radioactive iodine has been tried without success.  Lenvatinib is also used together with everolimus (Afinitor) to treat advanced kidney cancer when other medicines have not been effective.  Lenvatinib is also used alone to treat liver cancer that cannot be removed with surgery.  Lenvatinib may also be used for purposes not listed in this medication guide.  What should I discuss with my healthcare provider before taking lenvatinib?  You should not use lenvatinib if you are allergic to it.  Tell your doctor if you have ever had:  ?? heart disease, high blood pressure;  ?? a heart attack, heart failure, stroke, or blood clot;  ?? headaches or vision problems;  ?? bleeding problems;  ?? a perforation (a hole or tear) in your stomach or intestines;  ?? a seizure disorder;  ?? kidney disease; or  ?? liver disease.  You may need to have a negative pregnancy test before starting this treatment. Do not use lenvatinib if you are pregnant. It could harm the unborn baby. Use effective birth control to prevent pregnancy while you are using this medicine and for at least 30 days after your last dose.  This medicine may affect fertility (ability to have children) in both men and women. However, it is important to use birth control to prevent pregnancy because lenvatinib can harm an unborn baby.  It is not safe to breast-feed a baby while you are using this medicine.  Also do not breast-feed for at least 1 week after your last dose.  How should I take lenvatinib?  Lenvatinib is usually taken once per day. Follow all directions on your prescription label and read all medication guides or instruction sheets. Use the medicine exactly as directed.  Take lenvatinib at the same time each day, with or without food.  To get a full dose, you may need to take a combination of lenvatinib capsules with different amounts (strengths) of medicine in them. Follow your doctor's dosing instructions very carefully.  Swallow the capsule whole and do not crush, chew, break, or open it.  If you cannot swallow a capsule whole, dissolve the capsules in water as follows:  ?? Measure 1 tablespoon of water or apple juice and pour the liquid into a small glass.  ?? Place the capsules (whole,  not crushed or broken) into the liquid. Use only enough capsules for one dose.  ?? Allow the capsules to dissolve in the liquid for at least 10 minutes. Then, stir the mixture for at least 3 more minutes.  ?? Drink this mixture right away. Add a little more water or juice to the glass, swirl gently and drink right away.  Lenvatinib is usually given until your body no longer responds to the medication.  Call your doctor if you are sick with vomiting or severe diarrhea. Prolonged illness can lead to dehydration and kidney failure while you are taking lenvatinib.  You may need to take medicine to prevent diarrhea while you are using lenvatinib. Carefully follow your doctor's instructions about when to start taking the anti-diarrhea medicine.  You will need frequent blood and urine tests, and your blood pressure will need to be checked often.  Store at room temperature away from moisture and heat.  What happens if I miss a dose?  Use the medicine as soon as you can, but skip the missed dose if you are more than 12 hours late for the dose. Do not use two doses at one time.  What happens if I overdose?  Seek emergency medical attention or call the Poison Help line at [phone].  What should I avoid while taking lenvatinib?  Follow your doctor's instructions about any restrictions on food, beverages, or activity.  What are the possible side effects of lenvatinib?  Get emergency medical help if you have signs of an allergic reaction: hives; difficult breathing; swelling of your face, lips, tongue, or throat.  Some people taking lenvatinib have developed a perforation (a hole or tear) or a fistula (an abnormal passageway) within the stomach or intestines. Call your doctor if you have severe stomach pain, or if you feel like you are choking and gagging when you eat or drink.  Also call your doctor at once if you have:  ?? severe diarrhea;  ?? headache, confusion, change in mental status, vision loss, seizure (convulsions);  ?? little or no urination;  ?? unusual bleeding (nosebleeds, heavy menstrual bleeding), or any other bleeding that will not stop;  ?? signs of stomach bleeding --bloody or tarry stools, coughing up blood or vomit that looks like coffee grounds;  ?? heart problems --chest pain, pain in your jaw or shoulder, swelling, rapid weight gain, feeling short of breath;  ?? signs of a blood clot --sudden numbness or weakness, problems with vision or speech;  ?? liver problems --dark urine, clay-colored stools, jaundice (yellowing of the skin or eyes);  ?? low calcium level --muscle spasms or contractions, numbness or tingly feeling (around your mouth, or in your fingers and toes); or  ?? increased blood pressure --severe headache, blurred vision, pounding in your neck or ears, anxiety, nosebleed.  Your cancer treatments may be delayed or permanently discontinued if you have certain side effects.  Common side effects may include:  ?? bleeding;  ?? stomach pain, nausea, vomiting, diarrhea;  ?? loss of appetite, weight loss;  ?? abnormal thyroid function tests;  ?? muscle or joint pain;  ?? swelling in your arms and legs;  ?? mouth sores;  ?? rash;  ?? redness, itching, or peeling skin on your hands or feet;  ?? headache, tiredness; or  ?? cough, trouble breathing, hoarse voice.  This is not a complete list of side effects and others may occur. Call your doctor for medical advice about side effects. You may report side effects to  FDA at 1-800-FDA-1088.  What other drugs will affect lenvatinib?  Lenvatinib can cause a serious heart problem. Your risk may be higher if you also use certain other medicines for infections, asthma, heart problems, high blood pressure, depression, mental illness, cancer, malaria, or HIV.  Other drugs may affect lenvatinib, including prescription and over-the-counter medicines, vitamins, and herbal products. Tell your doctor about all your current medicines and any medicine you start or stop using.  Where can I get more information?  Your pharmacist can provide more information about lenvatinib.  Remember, keep this and all other medicines out of the reach of children, never share your medicines with others, and use this medication only for the indication prescribed.  Every effort has been made to ensure that the information provided by [employer] ('[employer]') is accurate, up-to-date, and complete, but no guarantee is made to that effect. Drug information contained herein may be time sensitive. [employer] information has been compiled for use by healthcare practitioners and consumers in the [place] and therefore [employer] does not warrant that uses outside of the [place] are appropriate, unless specifically indicated otherwise. [employer]'s drug information does not endorse drugs, diagnose patients or recommend therapy. [employer]'s drug information is an Investment banker, corporate to assist licensed healthcare practitioners in caring for their patients and/or to serve consumers viewing this service as a supplement to, and not a substitute for, the expertise, skill, knowledge and judgment of healthcare practitioners. The absence of a warning for a given drug or drug combination in no way should be construed to indicate that the drug or drug combination is safe, effective or appropriate for any given patient. [employer] does not assume any responsibility for any aspect of healthcare administered with the aid of information [employer] provides. The information contained herein is not intended to cover all possible uses, directions, precautions, warnings, drug interactions, allergic reactions, or adverse effects. If you have questions about the drugs you are taking, check with your doctor, nurse or pharmacist.  Copyright [phone] [employer] Version: 4.03. Revision date: 12/06/2017.  Care instructions adapted under license by [facility]. If you have questions about a medical condition or this instruction, always ask your healthcare professional. [employer] disclaims any warranty or liability for your use of this information.     Patient Education        everolimus (Afinitor)  Pronunciation:  E ver OH li mus (a FIN i tor)  Brand:  Afinitor, Systems analyst  What is the most important information I should know about Afinitor?  Afinitor can cause serious and sometimes fatal side effects. Call your doctor right away if you have: signs of infection --fever, chills, skin rash, joint pain, tiredness; lung problems --cough, chest pain, wheezing, shortness of breath; kidney problems --swelling, little or no urination; or liver problems --loss of appetite, dark urine, yellowing of your skin or eyes, or upper stomach pain.  If you have ever had hepatitis B, Afinitor can cause this condition to come back or get worse.  You may be more likely to have an allergic reaction if you take an ACE inhibitor heart or blood pressure medication while you are taking Afinitor. Tell your doctor about all your current medicines and any you start or stop using.  What is everolimus (Afinitor)?  Everolimus is a cancer medicine that interferes with the growth of cancer cells and slows their spread in the body.  The Afinitor brand of everolimus is used to treat certain types of kidney  cancer, breast cancer, or brain tumor. Afinitor is also used to treat certain types of advanced or progressive tumors of the stomach, intestines, or pancreas.  Afinitor is also used to treat certain types of seizures or non-cancerous (benign) tumors of the brain or kidney in people with a genetic condition called tuberous sclerosis complex.  This medication guide provides information about the Afinitor brand of everolimus. Zortress is another brand of everolimus used to prevent organ rejection after a kidney transplant.  Everolimus may also be used for purposes not listed in this medication guide.  What should I discuss with my healthcare provider before taking Afinitor?  You should not use this medicine if you are allergic to everolimus, sirolimus (Rapamune), or temsirolimus (Torisel).  Tell your doctor if you have ever had:  ?? an active or chronic infection;  ?? liver disease, especially hepatitis B;  ?? kidney disease;  ?? diabetes or high blood sugar;  ?? high cholesterol;  ?? if you are scheduled to receive any vaccine; or  ?? high blood pressure.  Both men and women using this medicine should use effective birth control to prevent pregnancy. Afinitor can harm an unborn baby or cause birth defects if the mother or father is using this medicine.  If you are a woman, keep using birth control for at least 8 weeks after your last dose of Afinitor. If you are a man, keep using birth control for at least 4 weeks after your last dose. Tell your doctor right away if a pregnancy occurs while either the mother or the father is using Afinitor.  This medicine may affect fertility (ability to have children) in both men and women. However, it is important to use birth control to prevent pregnancy because Afinitor may harm the baby if a pregnancy does occur.  You should not breast-feed while using this medicine and for at least 2 weeks after your last dose.  How should I take Afinitor?  Follow all directions on your prescription label and read all medication guides or instruction sheets. Your doctor may occasionally change your dose. Use the medicine exactly as directed.  Take Afinitor at the same time each day. You may take the medicine with or without food, but take it the same way each time.  Do not take an Afinitor regular tablet  together with an Afinitor dispersible tablet. Use only one form of this medicine.  Read and carefully follow any Instructions for Use provided with your medicine. Ask your doctor or pharmacist if you do not understand these instructions.  Afinitor can increase your risk of bleeding or infection by changing the way your immune system works. You will need frequent medical tests.  If you've ever had hepatitis B, using Afinitor can cause this virus to become active or get worse. You may need frequent liver function tests while using this medicine and for several months after you stop.  If you need surgery,  tell the surgeon ahead of time that you are using Afinitor. Your surgical incisions or other wounds may take longer to heal while you are taking this medicine.  Store at room temperature in the original container, away from moisture, heat, and light. Keep unused dispersible tablets in the foil blister pack.  What happens if I miss a dose?  Use the medicine as soon as you can, but skip the missed dose if you are more than 6 hours late for the dose. Do not use two doses at one time.  What happens  if I overdose?  Seek emergency medical attention or call the Poison Help line at [phone].  What should I avoid while taking Afinitor?  Do not receive a live vaccine while using Afinitor, and avoid coming into contact with anyone who has recently received a live vaccine. There is a chance that the virus could be passed on to you. Live vaccines include measles, mumps, rubella (MMR), polio, rotavirus, typhoid, yellow fever, varicella (chickenpox), and zoster (shingles).  Grapefruit may interact with Afinitor and lead to unwanted side effects. Avoid the use of grapefruit products.  If you develop mouth sores or ulcers, avoid using mouthwashes or applying medicines that contain alcohol, peroxide, iodine, or thyme. Your doctor may prescribe a steroid mouthwash if your mouth sores are severe.  This medicine can pass into body fluids (urine, feces, vomit). Caregivers should wear rubber gloves while cleaning up a patient's body fluids, handling contaminated trash or laundry or changing diapers. Wash hands before and after removing gloves. Wash soiled clothing and linens separately from other laundry.  What are the possible side effects of Afinitor?  Get emergency medical help if you have signs of an allergic reaction:  hives; chest pain, difficult breathing; swelling of your face, lips, tongue, or throat. You may be more likely to have some of these symptoms if you also take an ACE inhibitor heart or blood pressure medication.  Stop using Afinitor and call your doctor at once if you have:  ?? blisters or ulcers in your mouth, red or swollen gums, trouble swallowing;  ?? lung problems --new or worsening cough, chest pain, wheezing, feeling short of breath;  ?? signs of infection --fever, chills, tiredness, joint pain, skin rash;  ?? kidney problems --little or no urination; swelling in your feet or ankles;  ?? liver problems --nausea, loss of appetite, stomach pain (upper right side), dark urine, clay-colored stools, jaundice (yellowing of the skin or eyes);  ?? low blood cell counts --flu-like symptoms, skin sores, easy bruising, unusual bleeding, pale skin, cold hands and feet, feeling light-headed;  ?? any wound that will not heal; or  ?? a surgical incision that is red, warm, swollen, painful, bleeding, or oozing pus.  Your cancer treatments may be delayed or permanently discontinued if you have certain side effects.  Common side effects may include:  ?? fever, cough, infections, feeling weak or tired;  ?? mouth sores;  ?? nausea, loss of appetite;  ?? swelling anywhere in your body;  ?? rash;  ?? missed menstrual periods;  ?? headache; or  ?? high blood sugar --increased thirst, increased urination, dry mouth, fruity breath odor.  This is not a complete list of side effects and others may occur. Call your doctor for medical advice about side effects. You may report side effects to FDA at 1-800-FDA-1088.  What other drugs will affect Afinitor?  Tell your doctor about all your current medicines. Many drugs can affect Afinitor, especially:  ?? an antibiotic or antifungal medicine;  ?? heart or blood pressure medication;  ?? medicine to treat hepatitis C, or HIV/AIDS;  ?? seizure medicine;  ?? St. John's wort;  ?? tuberculosis medication; or  ?? drugs that weaken the immune system, such as cancer medicine, steroids, and medicines to prevent organ transplant rejection.  This list is not complete and many other drugs may affect Afinitor. This includes prescription and over-the-counter medicines, vitamins, and herbal products. Not all possible drug interactions are listed here.  Where can I get more information?  Your pharmacist  can provide more information about everolimus (Afinitor).  Remember, keep this and all other medicines out of the reach of children, never share your medicines with others, and use this medication only for the indication prescribed.  Every effort has been made to ensure that the information provided by [employer]') is accurate, up-to-date, and complete, but no guarantee is made to that effect. Drug information contained herein may be time sensitive. [employer] information has been compiled for use by healthcare practitioners and consumers in the [place] and therefore [employer] does not warrant that uses outside of the [place] are appropriate, unless specifically indicated otherwise. [employer]'s drug information does not endorse drugs, diagnose patients or recommend therapy. [employer]'s drug information is an Investment banker, corporate to assist licensed healthcare practitioners in caring for their patients and/or to serve consumers viewing this service as a supplement to, and not a substitute for, the expertise, skill, knowledge and judgment of healthcare practitioners. The absence of a warning for a given drug or drug combination in no way should be construed to indicate that the drug or drug combination is safe, effective or appropriate for any given patient. Mult[employer] does not assume any responsibility for any aspect of healthcare administered with the aid of information [employer] provides. The information contained herein is not intended to cover all possible uses, directions, precautions, warnings, drug interactions, allergic reactions, or adverse effects. If you have questions about the drugs you are taking, check with your doctor, nurse or pharmacist.  Copyright [phone] [employer] Version: 13.01. Revision date: 10/31/2016.  Care instructions adapted under license by [facility]. If you have questions about a medical condition or this instruction, always ask your healthcare professional. [employer], Incorporated disclaims any warranty or liability for your use of this information.

## 2018-08-06 NOTE — Unmapped (Signed)
Labs drawn and sent for analysis.  Care provided by  Y Cheek.

## 2018-08-06 NOTE — Unmapped (Signed)
Spoke with pt and let him know that his rapid flu is negative. Thankful for call.

## 2018-08-06 NOTE — Unmapped (Signed)
Clinical Pharmacist Practitioner: [facility]    Oral Chemotherapy Follow-Up  Assessment and recommendations:    1. mRCC- Sergio Horton started everolimus and lenvatinib last Friday. He reports severe bout of diarrhea starting Saturday evening and through Sunday. Now he reports about 5 loose BM per day. He has been feeling fatigued due to the diarrhea but otherwise no other symptoms to report.   - Continue everolimus and lenvatinib     2. Grade 1 Diarrhea- Reports diarrhea starting Saturday that was more severe but now is having around 5 BM per day. No evidence of dehydration or electrolyte abnormalities.    -Recommend to stay well hydrated with 2L of water daily  - Start loperamide as needed 4 mg with first loose stool followed by 2 mg every loose stool not to exceed 8 tablets per day    3. Hypertension- BP repeat today WNL. Checks BP at home occasionally, usually around 130/70 per patient.  - Continue amlodipine 10 mg daily  - Continue losartan 25 mg daily  - Monitor BP at home    4. 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 On cabozantinib since 07/08/17, with plan to continue at reduced dose of 20 mg daily. Discontinued due to POD on 07/09/18. Started everolimus/lenvatinib on 08/01/18.     Interm History:  Since Sergio Horton started his drug regimen last week, starting having diarrhea on Saturday evening through the night and through Sunday describes it as very severe. He now is having least 5 loose stools per day. He has been feeling drained and fatigued with the episodes of diarrhea. He denies sores in his mouth, or blisters on his hands and feet. He also reports cramps in his hands and legs.      Adherence: Takes 1 tablet of everolimus and 3 capsules of lenvatinib, has not missed any doses since he started last week    Toxicities: Diarrhea    Drug-Drug Interactions: none    Medications:  Current Outpatient Medications   Medication Sig Dispense Refill   ??? amLODIPine (NORVASC) 10 MG tablet Take 1 tablet (10 mg total) by mouth daily. 90 tablet 6   ??? B complex-vitamin C-folic acid (RENA-VITE) 0.8 mg Tab Take 1 tablet daily 30 tablet 12   ??? diclofenac sodium (VOLTAREN) 1 % gel Apply 2 g topically every six (6) hours as needed for arthritis. 100 g 0   ??? everolimus, antineoplastic, (AFINITOR) 5 mg tablet Take 1 tablet (5 mg total) by mouth daily. 30 tablet 3   ??? lenvatinib 18 mg/day (10 mg x 1-4 mg x2) cap Take one 10mg  capsule and two 4mg  capsules (18 mg total) by mouth daily. 360 capsule 3   ??? levothyroxine (SYNTHROID, LEVOTHROID) 112 MCG tablet TK 1 T PO QAM ON AN EMPTY STOMACH  1   ??? losartan (COZAAR) 25 MG tablet Take 1 tablet (25 mg total) by mouth daily. 90 tablet 3   ??? omeprazole (PRILOSEC) 20 MG capsule TAKE 1 CAPSULE BY MOUTH ONCE DAILY 90 capsule 0   ??? urea (CARMOL) 40 %  cream Apply twice daily to sore areas on hands and feet 198.6 g 1     No current facility-administered medications for this visit.      I spent 10 minutes with SergioHorton in direct patient care.    Laverna Peace PharmD, BCOP, CPP  Hematology/Oncology Pharmacist  P: [phone]

## 2018-08-07 NOTE — Unmapped (Signed)
Called pt back and he requested for income information for [employer] adjusted. Adjusted as requested.

## 2018-08-07 NOTE — Unmapped (Signed)
Hi,     Richy contacted the [employer] regarding the following:    -Stated that he need to correct information on Financial paperwork that were left with [employer].     Please contact Yadir at [phone].    Thanks in advance,    Christell Faith  [facility]   [phone]

## 2018-08-08 ENCOUNTER — Encounter: Admit: 2018-08-08 | Discharge: 2018-08-08 | Payer: MEDICARE | Attending: Family | Primary: Family

## 2018-08-08 NOTE — Unmapped (Signed)
Sent paperwork off to [employer]

## 2018-08-12 MED ORDER — LOSARTAN 25 MG TABLET
ORAL_TABLET | 3 refills | 0 days | Status: CP
Start: 2018-08-12 — End: 2019-02-02

## 2018-08-19 ENCOUNTER — Encounter: Admit: 2018-08-19 | Discharge: 2018-08-19 | Payer: MEDICARE | Attending: Family | Primary: Family

## 2018-08-19 DIAGNOSIS — C649 Malignant neoplasm of unspecified kidney, except renal pelvis: Principal | ICD-10-CM

## 2018-08-20 ENCOUNTER — Ambulatory Visit: Admit: 2018-08-20 | Discharge: 2018-08-21 | Payer: MEDICARE | Attending: Family | Primary: Family

## 2018-08-20 ENCOUNTER — Other Ambulatory Visit: Admit: 2018-08-20 | Discharge: 2018-08-21 | Payer: MEDICARE

## 2018-08-20 DIAGNOSIS — C779 Secondary and unspecified malignant neoplasm of lymph node, unspecified: Principal | ICD-10-CM

## 2018-08-20 DIAGNOSIS — K7460 Unspecified cirrhosis of liver: Secondary | ICD-10-CM

## 2018-08-20 DIAGNOSIS — I10 Essential (primary) hypertension: Secondary | ICD-10-CM

## 2018-08-20 DIAGNOSIS — C649 Malignant neoplasm of unspecified kidney, except renal pelvis: Secondary | ICD-10-CM

## 2018-08-20 LAB — GLUCOSE RANDOM: Glucose:MCnc:Pt:Ser/Plas:Qn:: 104

## 2018-08-20 LAB — CBC W/ AUTO DIFF
BASOPHILS ABSOLUTE COUNT: 0 10*9/L (ref 0.0–0.1)
EOSINOPHILS ABSOLUTE COUNT: 0.1 10*9/L (ref 0.0–0.4)
EOSINOPHILS RELATIVE PERCENT: 3.3 %
HEMATOCRIT: 39.2 % — ABNORMAL LOW (ref 41.0–53.0)
HEMOGLOBIN: 12.9 g/dL — ABNORMAL LOW (ref 13.5–17.5)
LARGE UNSTAINED CELLS: 4 % (ref 0–4)
LYMPHOCYTES ABSOLUTE COUNT: 1 10*9/L — ABNORMAL LOW (ref 1.5–5.0)
LYMPHOCYTES RELATIVE PERCENT: 30.4 %
MEAN CORPUSCULAR HEMOGLOBIN CONC: 33 g/dL (ref 31.0–37.0)
MEAN CORPUSCULAR HEMOGLOBIN: 33.7 pg (ref 26.0–34.0)
MEAN CORPUSCULAR VOLUME: 102 fL — ABNORMAL HIGH (ref 80.0–100.0)
MEAN PLATELET VOLUME: 7.9 fL (ref 7.0–10.0)
MONOCYTES ABSOLUTE COUNT: 0.3 10*9/L (ref 0.2–0.8)
MONOCYTES RELATIVE PERCENT: 9 %
NEUTROPHILS ABSOLUTE COUNT: 1.8 10*9/L — ABNORMAL LOW (ref 2.0–7.5)
PLATELET COUNT: 136 10*9/L — ABNORMAL LOW (ref 150–440)
RED BLOOD CELL COUNT: 3.84 10*12/L — ABNORMAL LOW (ref 4.50–5.90)
RED CELL DISTRIBUTION WIDTH: 15.6 % — ABNORMAL HIGH (ref 12.0–15.0)
WBC ADJUSTED: 3.4 10*9/L — ABNORMAL LOW (ref 4.5–11.0)

## 2018-08-20 LAB — COMPREHENSIVE METABOLIC PANEL
ALT (SGPT): 22 U/L (ref <50)
ANION GAP: 12 mmol/L (ref 7–15)
AST (SGOT): 55 U/L (ref 19–55)
BILIRUBIN TOTAL: 0.4 mg/dL (ref 0.0–1.2)
BLOOD UREA NITROGEN: 11 mg/dL (ref 7–21)
BUN / CREAT RATIO: 10
CALCIUM: 8.8 mg/dL (ref 8.5–10.2)
CHLORIDE: 104 mmol/L (ref 98–107)
CO2: 23 mmol/L (ref 22.0–30.0)
CREATININE: 1.14 mg/dL (ref 0.70–1.30)
EGFR CKD-EPI AA MALE: 75 mL/min/{1.73_m2} (ref >=60)
EGFR CKD-EPI NON-AA MALE: 65 mL/min/{1.73_m2} (ref >=60)
GLUCOSE RANDOM: 104 mg/dL (ref 70–179)
POTASSIUM: 3.6 mmol/L (ref 3.5–5.0)
PROTEIN TOTAL: 6.9 g/dL (ref 6.5–8.3)
SODIUM: 139 mmol/L (ref 135–145)

## 2018-08-20 LAB — MEAN CORPUSCULAR VOLUME: Lab: 102 — ABNORMAL HIGH

## 2018-08-20 LAB — MAGNESIUM: Magnesium:MCnc:Pt:Ser/Plas:Qn:: 1.4 — ABNORMAL LOW

## 2018-08-20 MED ORDER — UREA 40 % TOPICAL CREAM
1 refills | 0 days | Status: CP
Start: 2018-08-20 — End: 2018-09-03

## 2018-08-20 NOTE — Unmapped (Signed)
Labs drawn and sent for analysis.  Care provided by  Y Cheek.

## 2018-08-20 NOTE — Unmapped (Signed)
Clinical Pharmacist Practitioner: [facility]    Oral Chemotherapy Follow-Up  Assessment and recommendations:  1. mRCC- Sergio Horton started everolimus 5 mg and lenvatinib 18 mg on 08/01/18. He reported severe bout of diarrhea after beginning the medication but has now improved from 5 to 3 loose BM a day after taking loperamide last week. He has been feeling fatigued due to the diarrhea. He is experiencing slight tenderness in his heels and small blisters on his fingers as well.  - Continue everolimus and lenvatinib     2. Grade 1 Diarrhea- Reports diarrhea starting 07/30/18 that was more severe and was having 5 loose BM a day, but reports that now he is having only 3. He took loperamide last week which seemed to have controlled the diarrhea. No evidence of dehydration or electrolyte abnormalities besides a magnesium level of 1.4 in clinic today.  - Mag 1.4, CTM  - Recommend to stay well hydrated with 2L of water daily  - Use loperamide as needed for diarrhea    3. Hand Foot Syndrome- Since beginning the medication, he has slight tenderness on his heels but no reports of pain or blisters. He does have small blisters on his fingers, but he uses urea 40% cream which works.  - Continue urea 40% cream on hands  - Continue to monitor    4. Hypertension- BP 153/78 today in clinic. Checks BP at home with readings of 145/85 on 08/18/18 and 128/65 on 08/19/18. He is still taking amlodipine 10 mg and losartan 25 mg.  - Continue amlodipine 10 mg daily  - Continue losartan 25 mg daily  - Monitor BP at home    5. Thrombocytopenia- Platelets 136 today in clinic which is lower than normal (was 195 on 08/06/18).  - Continue to monitor    6. 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 On cabozantinib since 07/08/17, with plan to continue at reduced dose of 20 mg daily. Discontinued due to POD on 07/09/18. Started everolimus/lenvatinib on 08/01/18.     Interm History:  Sergio Horton is feeling better today in clinic. He took loperamide last week, his diarrhea has improved to 3 loose BM a day and he feels like it is more controlled. He has noticed that he is losing weight and his appetite is not really normal. He does not eat 3 full meals a day, he instead eats a large dinner and eats smaller meals throughout the day. He has started having tenderness on both of his heels, but they have not blistered and he says they are not painful. He has also noticed blisters on his fingers, however he uses urea cream and says that is helps.      Adherence: Takes 1 tablet of everolimus and 3 capsules of lenvatinib, has not missed any doses.    Toxicities: Diarrhea, HFS    Drug-Drug Interactions: none    Medications:  Current Outpatient Medications   Medication Sig Dispense Refill   ??? amLODIPine (NORVASC) 10 MG tablet Take 1 tablet (10 mg total) by mouth daily. 90 tablet 6   ???  B complex-vitamin C-folic acid (RENA-VITE) 0.8 mg Tab Take 1 tablet daily 30 tablet 12   ??? levothyroxine (SYNTHROID, LEVOTHROID) 112 MCG tablet TK 1 T PO QAM ON AN EMPTY STOMACH  1   ??? losartan (COZAAR) 25 MG tablet TAKE 1 TABLET BY MOUTH ONCE DAILY 90 tablet 3   ??? omeprazole (PRILOSEC) 20 MG capsule TAKE 1 CAPSULE BY MOUTH ONCE DAILY 90 capsule 0   ??? everolimus, antineoplastic, (AFINITOR) 5 mg tablet Take 1 tablet (5 mg total) by mouth daily. 30 tablet 3   ??? lenvatinib 18 mg/day (10 mg x 1-4 mg x2) cap Take one 10mg  capsule and two 4mg  capsules (18 mg total) by mouth daily. 360 capsule 3   ??? urea (CARMOL) 40 % cream Apply twice daily to sore areas on hands and feet 198.6 g 1     No current facility-administered medications for this visit.      I spent 10 minutes with Sergio Horton in direct patient care.    Entered by Hale Bogus, PharmD Candidate, acting as scribe for Laverna Peace, PharmD, BCOP, CPP. Signature: Hale Bogus.  August 20, 2018 9:40 AM    The documentation recorded by the scribe accurately reflects the service I personally performed and the decisions made by me.     Laverna Peace PharmD, BCOP, CPP  Hematology/Oncology Pharmacist  P: [phone]

## 2018-08-20 NOTE — Unmapped (Signed)
RTC in 2 weeks with labs °

## 2018-08-21 NOTE — Unmapped (Signed)
[facility] Refill Coordination Note    Specialty Medication(s) to be Shipped:   Hematology/Oncology: [name] 18mg  and Afinitor 5mg        Sergio Horton, DOB: [birth year]  Phone: [phone] (home) [phone] (work)      All above HIPAA information was verified with patient.     Completed refill call assessment today to schedule patient's medication shipment from the [facility] [phone]).       Specialty medication(s) and dose(s) confirmed: Regimen is correct and unchanged.   Changes to medications: [name] reports no changes reported at this time.  Changes to insurance: No  Questions for the pharmacist: No    The patient will receive a drug information handout for each medication shipped and additional FDA Medication Guides as required.      DISEASE/MEDICATION-SPECIFIC INFORMATION        N/A    ADHERENCE     Medication Adherence    Patient reported X missed doses in the last month:  0  Specialty Medication:  Lenvima 18mg   Patient is on additional specialty medications:  Yes  Additional Specialty Medications:  Afinitor 5mg   Patient Reported Additional Medication X Missed Doses in the Last Month:  0  Patient is on more than two specialty medications:  No  Any gaps in refill history greater than 2 weeks in the last 3 months:  no  Demonstrates understanding of importance of adherence:  yes  Informant:  patient  Reliability of informant:  reliable  Provider-estimated medication adherence level:  good  Adherence tools used:  medication list, directed education   Other adherence tool:  morning routine   Support network for adherence:  family member  Confirmed plan for next specialty medication refill:  delivery by pharmacy          Refill Coordination    Has the Patients' Contact Information Changed:  No  Is the Shipping Address Different:  No         MEDICARE PART B DOCUMENTATION     Lenvima 18mg  : Patient has 7 days worth on hand.  Afinitor 5mg : Patient has 7 days worth on hand.    SHIPPING     Shipping address confirmed in Epic.     Delivery Scheduled: Yes, Expected medication delivery date: 08/27/2018 via UPS or courier.     Medication will be delivered via UPS to the home address in Epic WAM.    [name]   [facility] Specialty Technician

## 2018-08-26 ENCOUNTER — Other Ambulatory Visit: Admit: 2018-08-26 | Discharge: 2018-08-27 | Payer: MEDICARE

## 2018-08-26 DIAGNOSIS — C649 Malignant neoplasm of unspecified kidney, except renal pelvis: Principal | ICD-10-CM

## 2018-08-26 MED FILL — LENVIMA 18 MG/DAY (10 MG X 1 AND 4 MG X 2) CAPSULE: ORAL | 30 days supply | Qty: 90 | Fill #1

## 2018-08-26 MED FILL — AFINITOR 5 MG TABLET: ORAL | 28 days supply | Qty: 28 | Fill #1

## 2018-08-26 MED FILL — LENVIMA 18 MG/DAY (10 MG X 1 AND 4 MG X 2) CAPSULE: 30 days supply | Qty: 90 | Fill #1 | Status: AC

## 2018-08-26 MED FILL — AFINITOR 5 MG TABLET: 28 days supply | Qty: 28 | Fill #1 | Status: AC

## 2018-08-26 NOTE — Unmapped (Signed)
Genitourinary Oncology Clinic Follow-Up Note    Patient Name: Sergio Horton  Encounter Date: 08/20/2018    Referring Physician: Renford Dills, [place], [place] [facility])  Urologist: Gean Quint    Assessment/Plan:    68 y.o.M with alcoholic cirrhosis, HTN, and h/o pT3a ccRCC s/p 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 He has progressed through Sunitnib, Nivolumab + Ipilimumab via OMNIVORE clinical trial, Cabozantinib, and has now started on treatment with Lenvatinib + everolimus.        1. ccRCC: on treatment with Lenvtinib + Everolimus (1/2/20120-present)    ?? Previous Treatments: Sunitnib (07/27/2016-12/03/2016), Nivolumab + Ipilimumab + Nivo via OMNIVORE clinical trial (01/21/2017-05/29/2017), and Cabozantinib (07/08/2017-07/22/2018)     ?? Previous imaging from 04/2018, with overall disease stability, but mild enlargement in paraortic nodes. We elected to continue treatment on cabozantinib given good tolerability and only small amount of growth, with plan to repeat imaging in 8 weeks. His 8 week interval scans were reviewed at his last visit and unfortunately, the scans continued to demonstrate growth, as well as, increased peritoneal thickening. We did review imaging in tumor board and it was agreed that his disease was progressing radiographically. Therefore we recommended treatment change to Lenvatinib + everoliums, as he has seen 2 TKI's and immunotherapy, but has never been treated with a mTOR inhibitor. He started the agents approx 4 weeks ago (07/24/2018) and returns today for evaluation. He continues to tolerate well. He       2. Cirrhosis: Has intermittent baseline leukopenia and thrombocytopia, although reports no episodes of decompensated cirrhosis and currently in normal range. Stable and well compensated.   ?? Avoids tylenol    3. HTN: better controled. Currently on amlodipine 10mg  and losartan 25mg .  ?? Home readings 130-140's/80's  ?? Discontinued lisinopril previously due to cough     4. Asymmetric LE edema: No DVT per recent doppler    5. Elevated TSH: On synthroid, follows with PCP    6. Ventral hernia: easily reduces. Advised wearing abdominal binder if is becomes bothersome. He has met with  Dr. Carlynn Purl and will follow up with her after imaging has been completed in October. Given disease growth, I would depolarize this at this time.     7. Elevated sCr: stable; CTM     8. GERD: prilosec    9. Health Maintenance: Influenza given 04/2018    10. PPE --couple of small peeling areas noted to fingers. No blisters, denies soreness. Using Urea cream BID  ?? Continue urea cream    11. Diarrhea: reports significant diarrhea on Sat. But has resolved at this time. Unlikely related to treatment, but will follow closely, and educated on imodium use. No electrolyte deficiency, or s/s of dehydration.     12. Decreasing plt count: 136 today, down from previous 198. Will monitor closely.    Plan:  ?? Continue Lenvatinib + Everolimus; Pharmacy Council provided today  ?? RTC in 2 weeks with labs. Will plan to see every 2 weeks x 8 weeks, then monthly if stable.   ?? Imodium for diarrhea   ?? Monitor Magnesium closely, 1.4 today  ?? Monitor plts- 136 today    Greater than 50% of today's 30 minute office visit was dedicated to face-to-face counseling on test results, diagnosis, treatment and prognosis-- [name], FNP- BC    History of Present Illness:    Sergio Horton is a 68 y.o. male with h/o right nephrectomy and caval thrombectomy for ccRCC who is seen in follow-up for locally recurrent RCC, on  treatment via cabozantinib.     He originally presented in 2015 with a large right renal mass. He underwent nephrectomy in 06/2014 and pathology showed a 11.5cm grade 2 ccRCC with invasion into the perirenal adipose tissue (pT3a). He also had a retrocaval mass resected that also was ccRCC. Post-op course was complicated by respiratory failure requiring ECMO. He was in the hospital for a month after his surgery. Routine imaging demonstrated likely local recurrence and biopsy confirmed RCC. He initiated sunitinib on 07/27/16 and dose reduced 08/22/16 due to neutropenia, restarted 09/03/16, the dose reduced again for HFS. CT on 12/03/16 showed POD and sunitinib was stopped.  He transitioned to treatment via OMNIVORE trial (01/07/2017-06/24/2017), that utilized single agent nivolumab, until POD was noted, then he received 2 cycles of Ipi + Nivo. Repeat imaging reported POD.     Given POD on IO, he started Cabozantinib on 07/03/2017. To note he was started on 40mg  given significant liver disease, and was dose reduced to 20mg  after approx 6mon on treatment 2/2 persistent SE's including PPE, HTN, fatigue, appetite loss, and intermittent elevation in LFTs. He did well on 20mg , but most recent imaging from 06/2018 reported overall disease growth.     Lenvtinib + everolimus was recommended after imaging review in tumor board. He was able to start this regimen 07/24/2018.     Interval History:  Chills started yesterday, but no subjective fevers and afebrile in office today  Diarrhea started Saturday, 1 day after initiating Lenvatinib + everolimus, so likely not treatment related. It has resolved at this time. Able to keep fluids down easily.  Appetite is fair, but his weight is stable  His energy level remains low today  No new pain  No blisters noted to hands/feet, but 2 small dry patches noted to his fingers. He is using urea cream       Past Medical History:  Past Medical History:   Diagnosis Date   ??? Cirrhosis of liver (CMS-HCC)    ??? Hypertension    ??? Hypothyroidism    ??? Renal cancer (CMS-HCC)    ??? Thrombocytopathia (CMS-HCC)    Alcoholic Cirrhosis - denies any ascites or h/o encephalopathy  HTN   No history of autoimmune disorder.    Medications:    Current Outpatient Medications on File Prior to Visit   Medication Sig Dispense Refill   ??? amLODIPine (NORVASC) 10 MG tablet Take 1 tablet (10 mg total) by mouth daily. 90 tablet 6   ??? B complex-vitamin C-folic acid (RENA-VITE) 0.8 mg Tab Take 1 tablet daily 30 tablet 12   ??? levothyroxine (SYNTHROID, LEVOTHROID) 112 MCG tablet TK 1 T PO QAM ON AN EMPTY STOMACH  1   ??? losartan (COZAAR) 25 MG tablet TAKE 1 TABLET BY MOUTH ONCE DAILY 90 tablet 3   ??? omeprazole (PRILOSEC) 20 MG capsule TAKE 1 CAPSULE BY MOUTH ONCE DAILY 90 capsule 0   ??? [EXPIRED] diclofenac sodium (VOLTAREN) 1 % gel Apply 2 g topically every six (6) hours as needed for arthritis. 100 g 0   ??? everolimus, antineoplastic, (AFINITOR) 5 mg tablet Take 1 tablet (5 mg total) by mouth daily. 30 tablet 3   ??? lenvatinib 18 mg/day (10 mg x 1-4 mg x2) cap Take one 10mg  capsule and two 4mg  capsules (18 mg total) by mouth daily. 360 capsule 3   ??? [DISCONTINUED] lisinopril (PRINIVIL,ZESTRIL) 10 MG tablet Take 1 tablet (10 mg total) by mouth daily. 90 tablet 3     No current  facility-administered medications on file prior to visit.      Allergies:  No Known Allergies    Social History:  Lives in [place] with wife. He has a h/o cirrhosis from alcohol use but does not currently use alcohol. No tobacco use. No other drugs. Works as a Financial risk analyst.    Review of Systems: A 10 system review was completed and was negative except per HPI.    Physical Exam:  Vitals:    08/20/18 0840   BP: 153/78   Pulse:    Resp:    Temp:    SpO2:      ECOG PS 1    General:   No acute distress, alert and interactive   Eyes:    Extra ocular muscles intact.  Sclera not icteric.   ENT:  Oropharynx clear.   Neck:  Supple   Lymph Nodes:  No cervical adenopathy   Cardiovascular:  RRR without murmurs, rubs, gallops   Lungs:  Clear to auscultation bilaterally, without wheezes/crackles/rhonchi.   Skin:    Healing hand blisters, no other rash.    Abdomen:   Abdomen soft, not tender and not distended, +incisional hernia, well-healed incision   Extremities:   Warm and well-perfused with L>R edema.   Neurological:  Alert and oriented to person, place and time.     Labs:  Reviewed in Epic    Imaging:    05/25/16 CT CAP: -- Status post right nephrectomy.  -- Irregular soft tissue density foci with peripheral enhancement in the right nephrectomy bed and extending inferiorly along the psoas muscle, concerning for recurrent malignancy.  -- Aortocaval lymphadenopathy.    07/04/16 Bone scan  -No evidence of metastatic osseous disease.    07/20/16 Echo:  ?? Normal left ventricular systolic function, ejection fraction > 55%  ?? Diastolic dysfunction - grade I (normal filling pressures)  ?? Normal right ventricular systolic function      09/24/16 CT CAP:  -- Status post right nephrectomy. There has been interval response to therapy, with decreasing size of recurrent tumor within the nephrectomy bed, and decreasing aortocaval nodal tissue.  -- Unchanged ultrasound morphology of liver compatible with history of cirrhosis.    12/03/16 CT CAP:  -Worsening metastatic disease in the right nephrectomy bed with enlarging aortocaval nodes/nodules and new perihepatic implant.    For reference pericaval nodes/nodules now measure up to 2 cm, previously 1.5 cm (2:93). There is new enhancing lesion in/along the right psoas musculature measuring up to 1.8 cm (2:99-106).     03/05/2017 CT CAP s/p 4C Nivo on Omnivore:  - Increase appearance of metastatic implants along the inferior right hepatic lobe. Increased size of nodularity in the right nephrectomy and increased pericaval node/nodule compatible with worsening metastatic disease.  --Increased size of pericaval nodes/nodules measure up to 2.4 cm (2:93), previously 2.0 cm  --Slightly increased size of nodularity in the right nephrectomy bed measuring 3.6 by 3.4 x 2.0 cm (2:90)  -Small-volume ascites.      06/24/2017 CT CAP  -Progressive disease with increasing nephrectomy bed soft tissue, adjacent retroperitoneal adenopathy, right psoas/paraspinal soft tissue implants, and posterior perihepatic implant. No evidence of metastatic disease in the chest.  -Small amount of nonspecific fluid/small collection adjacent to the ventral abdominal hernia sac on the right measuring 1.6 cm. ??This appears increased compared to 04/02/17 but similar to 03/05/2017.  ????-Mild circumferential bladder wall thickening which may be due to contraction versus cystitis. Recommend correlation with urinalysis.  -Cirrhotic liver morphology.  09/11/2017 CT CAP s/p 8 weeks Cabozantinib  1. Stable to slight interval decrease in necrotic metastatic implants in the right renal fossa and necrotic aortocaval adenopathy. Stable perihepatic implant.   **Centrally necrotic 2.4 x 2.1 cm mass abutting and likely invading the adjacent right psoas muscle. This is not significantly changed from prior examination which time it measured 2.5 x 2.3 cm on remeasurement.   **3.1 x 1.9 cm necrotic mass (1:37), previously previously 3.8 x 2.2 cm.   **Aortocaval adenopathy measures up to 2.6 cm in short axis, previously 2.8 cm (1:30). A more inferior aortocaval node measures 2.5 cm, previously 2.7 cm (1:44).   2. Small amount of fluid along the right lateral aspect of the small and large bowel containing ventral hernia, nonspecific. No evidence of obstruction at the hernia site.-- he has seen a general surgeon recently regarding possible repair  3. Cirrhotic liver with small caliber gastrohepatic and esophageal varices.  4. No evidence of thoracic metastatic disease.    12/03/2017 CT CAP on Cabozantinib  1. Stable to slight interval decrease in necrotic metastatic implants in the right renal fossa and necrotic aortocaval adenopathy. Stable perihepatic implant.  2. Small amount of fluid along the right lateral aspect of the small and large bowel containing ventral hernia, nonspecific. No evidence of obstruction at the hernia site.  3. Cirrhotic liver with small caliber gastrohepatic and esophageal varices.  4. No evidence of thoracic metastatic disease.    CT CAP 04/30/2018  IMPRESSION:  Since 02/26/2018:  -Mildly increased size of necrotic peritoneal implants and retroperitoneal nodes with invasion into the right psoas and through the posterior abdominal wall and into the paraspinal musculature as described above.  -Focal small bowel thickening within the pelvis that is new. This may represent focal enteritis versus focal lesion. Close attention on follow-up.   -No other sites of disease identified.  -There is new small volume ascites which is likely related to cirrhosis and portal hypertension as evidenced by small caliber paraesophageal and upper abdominal varices.    Imaging reviewed from today; 07/09/2018; CT CAP  --Status post right nephrectomy.  ??  --Mild interval increase in size of right retroperitoneal metastatic implants as above.  ??  --Large anterior abdominal wall bowel-containing hernia with multiloculated rim enhancing pockets of fluid, some of them only slightly decreased but overall grossly unchanged from prior.    --LYMPH NODES: Interval progression of centrally necrotic extracapsular implant posterior to the hepatic segment 6, measuring 1.2 x 2.2 cm (previously 1.2 x 1.9 cm) (1:43).  Mildly increased right retroperitoneal centrally necrotic paraspinal and aortocaval metastases, as a reference:  -Right paraspinal lesion medial to the caudate lobe with invasion of the psoas muscle, intercostal muscle and paraspinal muscles, orbital measuring 6.8 x 2.9 cm (previously 6.0 x 2.6 cm) (1:38).   -Implant within the right psoas measures 1.8 x 2.2 cm (previously 1.7 x 2.4 cm).  -2.8 x 4.5 cm, aortocaval, previously 2.4 x 4.0 cm (1:55). The adjacent IVC is displaced laterally.    Pathology:    06/29/14:  Diagnosis:  A: ??Liver, segment 5, core needle biopsy  - Cirrhosis, etiology uncertain (see Light Microscopy) ??    B: ??Kidney and adrenal, right, radical nephrectomy, adrenalectomy, and caval  thrombectomy   Tumor histologic type/subtype: ??clear cell carcinoma  Sarcomatoid features:?????????? not identified   Histologic grade: Grade 2 (of 4) Fuhrman classification??????????  Tumor size: 11.5 cm diameter   Tumor focality: unifocal     Extent of invasion:??????????  ???? ??  Extra-capsular invasion into perirenal adipose tissue: not identified  ???? ?? Gerota's fascia: not involved   ???? ?? Renal sinus: not involved ??????????  ???? ?? Major veins (renal vein or segmental branches, IVC): involved (macro and  micro)  ???? ?? Ureter: not involved   ???? ?? Venous: ??involved   ???? ?? Lymphatic:??????????not involved   ???? ??   Histologic assessment of surgical margins:??????????  ???? ?? Peri-nephric adipose tissue margin (partial nephrectomy only): negative   ???? ?? Gerota's fascia: negative   ???? ?? Renal vein margin:??????????no adherent carcinoma identified on initial or  recuts of en face margin (B2) ??  ???? ?? Ureter margin: ??negative   Adrenal gland:?????????? negative   Lymph nodes:??????????none received     Pathologic findings in non-neoplastic kidney: focal global glomerulosclerosis   AJCC Stage (renal primary): ??pT3b (intrahepatic caval involvement) ?? pNx ?? pMx    C: ??Mass, retrocaval, excision   - Renal clear cell carcinoma, partially encapsulated, presumed confined by  Gerota's fascia, extending to uninked tissue edges    06/27/16:   Right nephrectomy bed, core biopsy and touch preparation:  - Diagnostic of malignancy.  - Morphology consistent with recurrence of patient's known clear cell renal cell carcinoma (Grade 2 of 4 Fuhrman Nuclear Grade).

## 2018-09-02 NOTE — Unmapped (Signed)
-----   Message from Estevan Oaks sent at 09/02/2018 12:47 PM EST -----  Appts changed.    Thanks,  Jaynie Bream  ----- Message -----  From: Elijah Birk, RN  Sent: 09/02/2018  12:37 PM EST  To: Devona Konig,    Can you move pt to 2/12 from 2/13 - 1pm labs and 2pm [place]. Pt is aware.    Thanks,  [employer]

## 2018-09-03 ENCOUNTER — Ambulatory Visit: Admit: 2018-09-03 | Discharge: 2018-09-04 | Payer: MEDICARE | Attending: Family | Primary: Family

## 2018-09-03 ENCOUNTER — Other Ambulatory Visit: Admit: 2018-09-03 | Discharge: 2018-09-04 | Payer: MEDICARE

## 2018-09-03 DIAGNOSIS — C649 Malignant neoplasm of unspecified kidney, except renal pelvis: Principal | ICD-10-CM

## 2018-09-03 DIAGNOSIS — C779 Secondary and unspecified malignant neoplasm of lymph node, unspecified: Principal | ICD-10-CM

## 2018-09-03 LAB — COMPREHENSIVE METABOLIC PANEL
ALBUMIN: 3.6 g/dL (ref 3.5–5.0)
ALKALINE PHOSPHATASE: 105 U/L (ref 38–126)
ALT (SGPT): 22 U/L (ref <50)
ANION GAP: 11 mmol/L (ref 7–15)
AST (SGOT): 53 U/L (ref 19–55)
BILIRUBIN TOTAL: 0.7 mg/dL (ref 0.0–1.2)
BUN / CREAT RATIO: 10
CALCIUM: 8.7 mg/dL (ref 8.5–10.2)
CHLORIDE: 106 mmol/L (ref 98–107)
CO2: 19 mmol/L — ABNORMAL LOW (ref 22.0–30.0)
CREATININE: 1.36 mg/dL — ABNORMAL HIGH (ref 0.70–1.30)
EGFR CKD-EPI AA MALE: 61 mL/min/{1.73_m2} (ref >=60)
EGFR CKD-EPI NON-AA MALE: 53 mL/min/{1.73_m2} — ABNORMAL LOW (ref >=60)
GLUCOSE RANDOM: 105 mg/dL (ref 70–179)
POTASSIUM: 3.7 mmol/L (ref 3.5–5.0)
PROTEIN TOTAL: 7.2 g/dL (ref 6.5–8.3)
SODIUM: 136 mmol/L (ref 135–145)

## 2018-09-03 LAB — CBC W/ AUTO DIFF
BASOPHILS ABSOLUTE COUNT: 0 10*9/L (ref 0.0–0.1)
BASOPHILS RELATIVE PERCENT: 0.3 %
EOSINOPHILS ABSOLUTE COUNT: 0.2 10*9/L (ref 0.0–0.4)
EOSINOPHILS RELATIVE PERCENT: 3.7 %
HEMATOCRIT: 41.3 % (ref 41.0–53.0)
HEMOGLOBIN: 13.5 g/dL (ref 13.5–17.5)
LARGE UNSTAINED CELLS: 3 % (ref 0–4)
LYMPHOCYTES ABSOLUTE COUNT: 1.3 10*9/L — ABNORMAL LOW (ref 1.5–5.0)
LYMPHOCYTES RELATIVE PERCENT: 29.4 %
MEAN CORPUSCULAR HEMOGLOBIN CONC: 32.8 g/dL (ref 31.0–37.0)
MEAN CORPUSCULAR HEMOGLOBIN: 32.1 pg (ref 26.0–34.0)
MEAN CORPUSCULAR VOLUME: 97.9 fL (ref 80.0–100.0)
MEAN PLATELET VOLUME: 7.9 fL (ref 7.0–10.0)
MONOCYTES ABSOLUTE COUNT: 0.5 10*9/L (ref 0.2–0.8)
NEUTROPHILS ABSOLUTE COUNT: 2.3 10*9/L (ref 2.0–7.5)
NEUTROPHILS RELATIVE PERCENT: 52.6 %
RED BLOOD CELL COUNT: 4.22 10*12/L — ABNORMAL LOW (ref 4.50–5.90)
RED CELL DISTRIBUTION WIDTH: 15.1 % — ABNORMAL HIGH (ref 12.0–15.0)
WBC ADJUSTED: 4.3 10*9/L — ABNORMAL LOW (ref 4.5–11.0)

## 2018-09-03 LAB — SODIUM: Sodium:SCnc:Pt:Ser/Plas:Qn:: 136

## 2018-09-03 LAB — LYMPHOCYTES ABSOLUTE COUNT: Lab: 1.3 — ABNORMAL LOW

## 2018-09-03 MED ORDER — ONDANSETRON 8 MG DISINTEGRATING TABLET
ORAL_TABLET | Freq: Three times a day (TID) | ORAL | 1 refills | 0 days | Status: CP | PRN
Start: 2018-09-03 — End: 2018-11-05

## 2018-09-03 MED ORDER — UREA 40 % TOPICAL CREAM
1 refills | 0 days | Status: CP
Start: 2018-09-03 — End: 2018-11-05

## 2018-09-03 NOTE — Unmapped (Addendum)
Plan  1. Hydrate-drink water and Gatorade to help improve you kidney function.  2. Diarrhea- take imodium daily to help retain your water and electrolytes.  3. Return to Clinic in two weeks for a lab check and for a MRI of the Brain.  4. Take Zofran when you start to feel nauseous.

## 2018-09-03 NOTE — Unmapped (Signed)
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 He has progressed through Sunitnib, Nivolumab + Ipilimumab via OMNIVORE clinical trial, Cabozantinib, and has now started on treatment with Lenvatinib + everolimus.        1. ccRCC: on treatment with Lenvtinib + Everolimus (1/2/20120-present)    ?? Previous Treatments: Sunitnib (07/27/2016-12/03/2016), Nivolumab + Ipilimumab + Nivo via OMNIVORE clinical trial (01/21/2017-05/29/2017), and Cabozantinib (07/08/2017-07/22/2018)     ?? Previous imaging from 04/2018, with overall disease stability, but mild enlargement in paraortic nodes. We elected to continue treatment on cabozantinib given good tolerability and only small amount of growth, with plan to repeat imaging in 8 weeks. His 8 week interval scans were reviewed at his last visit and unfortunately, the scans continued to demonstrate growth, as well as, increased peritoneal thickening. We did review imaging in tumor board and it was agreed that his disease was progressing radiographically. Therefore we recommended treatment change to Lenvatinib + everoliums, as he has seen 2 TKI's and immunotherapy, but has never been treated with a mTOR inhibitor. He started the agents approx 4 weeks ago (07/24/2018) and returns today for evaluation. He continues to tolerate ok overall, with his only complaints being intermittent diarrhea and decreased appetite. His wife also accompanies him today to discuss recent forgetfulness and depression (see below for discussion)    ?? Tempus testing-- pending    2. Cirrhosis: Has intermittent baseline leukopenia and thrombocytopia, although reports no episodes of decompensated cirrhosis and currently in normal range. Stable and well compensated.   ?? Avoids tylenol  ?? Denies current alcohol use    3. HTN: better controled. Currently on amlodipine 10mg  and losartan 25mg .  ?? Home readings 130-140's/80's  ?? Discontinued lisinopril previously due to cough     4. Asymmetric LE edema: No DVT per recent doppler    5. Elevated TSH: On synthroid, follows with PCP    6. Ventral hernia: easily reduces. Advised wearing abdominal binder if is becomes bothersome. He has met with  Dr. Carlynn Purl and will follow up with her after imaging has been completed in October. Given disease growth, I would depolarize this at this time.       7. Elevated sCr: increased from baseline today.  Patient to receive IV fluids 1L normal saline per therapy plan.      8. GERD: prilosec    9. Health Maintenance: Influenza given 04/2018    10. PPE - no blisters today  ?? Continue urea cream    11. Diarrhea: Patient reports three to four watery bowel movements daily.  Patient is taking imodium intermittently. We reviewed taking imodium scheduled every morning and again at lunch time if needed.     12. Decreasing plt count last reading of 136 on 08/20/17.  Stable today at 188    13. Forgetfulness- Patient's wife reports increased, intermittent forgetfulness with daily tasks.  Possible causes of forgetfulness discussed included, brain metastasis depressed mood due to seasonal depression, prognosis of disease, side effects of treatment, and strain of family and work relationships.  Patient encouraged to assess emotional and cognitive interventions. Medications for depression discussed (Zoloft), but patient declines at this time. Brain MRI scheduled prior to next visit to r/o pathologic causes, although there are no neurologic changes/deficits  to highly suggest brain mets, however will scan to be sure.          Greater than 50% of today's 30 minute office visit was dedicated to face-to-face counseling on test results, diagnosis, treatment and prognosis-- [name], FNP- BC    History of Present Illness:    GERVIS GABA is a 68 y.o. male with h/o right nephrectomy and caval thrombectomy for ccRCC who is seen in follow-up for met ccRCC.    He originally presented in 2015 with a large right renal mass. He underwent nephrectomy in 06/2014 and pathology showed a 11.5cm grade 2 ccRCC with invasion into the perirenal adipose tissue (pT3a). He also had a retrocaval mass resected that also was ccRCC. Post-op course was complicated by respiratory failure requiring ECMO. He was in the hospital for a month after his surgery. Routine imaging demonstrated likely local recurrence and biopsy confirmed RCC. He initiated sunitinib on 07/27/16 and dose reduced 08/22/16 due to neutropenia, restarted 09/03/16, the dose reduced again for HFS. CT on 12/03/16 showed POD and sunitinib was stopped.  He transitioned to treatment via OMNIVORE trial (01/07/2017-06/24/2017), that utilized single agent nivolumab, until POD was noted, then he received 2 cycles of Ipi + Nivo. Repeat imaging reported POD.     Given POD on IO, he started Cabozantinib on 07/03/2017. To note he was started on 40mg  given significant liver disease, and was dose reduced to 20mg  after approx 6mon on treatment 2/2 persistent SE's including PPE, HTN, fatigue, appetite loss, and intermittent elevation in LFTs. He did well on 20mg , but most recent imaging from 06/2018 reported overall disease growth.     Lenvtinib + everolimus was recommended after imaging review in tumor board. He was able to start this regimen 07/24/2018.     Interval History:  Mr. Shaul returns today accompanied by his wife, who normally does not attend his office visits, but comes out of concern today. She endorses more forgetfulness in [name]'s day to day routines, however, Gloris Manchester has been very preoccupied with working, caring for her as her health is not good, and dealing with his own advancing disease and treatment SE's. She denies any notable neurologic changes, only reports that he forgets to close the door, or complete daily tasks. [name] endorse feeling more stressed recently, and also reports seasonal depression, including lack of desire to want to do the things he enjoys. He feels tired, but is sleeping well at night. He denies HA, vision changes, dizziness, or difficulty with ambulation. He is trying to maintain a positive outlook, but the weather has him down. He does not take any antidepressants, sleep aids, or seek council.     Diarrhea is intermittent, he is using imodium prn. He is trying to maintain good hydration with water and gatorade    Appetite is fair, but his weight is stable    His energy level remains low today, but likely multifactorial     No new pain    No blisters noted to hands/feet, but 2 small dry patches noted to his fingers. He is using urea cream       Past Medical History:  Past Medical History:   Diagnosis Date   ??? Cirrhosis of liver (CMS-HCC)    ??? Hypertension    ??? Hypothyroidism    ??? Renal cancer (CMS-HCC)    ??? Thrombocytopathia (CMS-HCC)    Alcoholic Cirrhosis - denies any ascites or h/o encephalopathy  HTN   No history of autoimmune disorder.  Medications:    Current Outpatient Medications on File Prior to Visit   Medication Sig Dispense Refill   ??? amLODIPine (NORVASC) 10 MG tablet Take 1 tablet (10 mg total) by mouth daily. 90 tablet 6   ??? B complex-vitamin C-folic acid (RENA-VITE) 0.8 mg Tab Take 1 tablet daily 30 tablet 12   ??? [EXPIRED] diclofenac sodium (VOLTAREN) 1 % gel Apply 2 g topically every six (6) hours as needed for arthritis. 100 g 0   ??? everolimus, antineoplastic, (AFINITOR) 5 mg tablet Take 1 tablet (5 mg total) by mouth daily. 30 tablet 3   ??? lenvatinib 18 mg/day (10 mg x 1-4 mg x2) cap Take one 10mg  capsule and two 4mg  capsules (18 mg total) by mouth daily. 360 capsule 3   ??? levothyroxine (SYNTHROID, LEVOTHROID) 112 MCG tablet TK 1 T PO QAM ON AN EMPTY STOMACH  1   ??? losartan (COZAAR) 25 MG tablet TAKE 1 TABLET BY MOUTH ONCE DAILY 90 tablet 3   ??? omeprazole (PRILOSEC) 20 MG capsule TAKE 1 CAPSULE BY MOUTH ONCE DAILY 90 capsule 0   ??? [DISCONTINUED] lisinopril (PRINIVIL,ZESTRIL) 10 MG tablet Take 1 tablet (10 mg total) by mouth daily. 90 tablet 3     No current facility-administered medications on file prior to visit.      Allergies:  No Known Allergies    Social History:  Lives in [place] with wife. He has a h/o cirrhosis from alcohol use but does not currently use alcohol. No tobacco use. No other drugs. Works as a Financial risk analyst.    Review of Systems: A 10 system review was completed and was negative except per HPI.    Physical Exam:  Vitals:    09/03/18 1326   BP: 149/90   Pulse: 72   Resp: 16   Temp: 36.4 ??C (97.5 ??F)   SpO2: 96%     ECOG PS 1    General:   No acute distress, alert and interactive   Eyes:    Extra ocular muscles intact.  Sclera not icteric.   ENT:  Oropharynx clear.   Neck:  Supple   Lymph Nodes:  No cervical adenopathy   Cardiovascular:  RRR without murmurs, rubs, gallops   Lungs:  Clear to auscultation bilaterally, without wheezes/crackles/rhonchi.   Skin:    Healing hand blisters, no other rash.    Abdomen:   Abdomen soft, not tender and not distended, +incisional hernia, well-healed incision   Extremities:   Warm and well-perfused with L>R edema.   Neurological:  Alert and oriented to person, place and time.     Labs:  Reviewed in Epic    Imaging:    05/25/16 CT CAP:  -- Status post right nephrectomy.  -- Irregular soft tissue density foci with peripheral enhancement in the right nephrectomy bed and extending inferiorly along the psoas muscle, concerning for recurrent malignancy.  -- Aortocaval lymphadenopathy.    07/04/16 Bone scan  -No evidence of metastatic osseous disease.    07/20/16 Echo:  ?? Normal left ventricular systolic function, ejection fraction > 55%  ?? Diastolic dysfunction - grade I (normal filling pressures)  ?? Normal right ventricular systolic function      09/24/16 CT CAP:  -- Status post right nephrectomy. There has been interval response to therapy, with decreasing size of recurrent tumor within the nephrectomy bed, and decreasing aortocaval nodal tissue.  -- Unchanged ultrasound morphology of liver compatible with history of cirrhosis.    12/03/16 CT CAP:  -  Worsening metastatic disease in the right nephrectomy bed with enlarging aortocaval nodes/nodules and new perihepatic implant.    For reference pericaval nodes/nodules now measure up to 2 cm, previously 1.5 cm (2:93). There is new enhancing lesion in/along the right psoas musculature measuring up to 1.8 cm (2:99-106).     03/05/2017 CT CAP s/p 4C Nivo on Omnivore:  - Increase appearance of metastatic implants along the inferior right hepatic lobe. Increased size of nodularity in the right nephrectomy and increased pericaval node/nodule compatible with worsening metastatic disease.  --Increased size of pericaval nodes/nodules measure up to 2.4 cm (2:93), previously 2.0 cm  --Slightly increased size of nodularity in the right nephrectomy bed measuring 3.6 by 3.4 x 2.0 cm (2:90)  -Small-volume ascites.      06/24/2017 CT CAP  -Progressive disease with increasing nephrectomy bed soft tissue, adjacent retroperitoneal adenopathy, right psoas/paraspinal soft tissue implants, and posterior perihepatic implant. No evidence of metastatic disease in the chest.  -Small amount of nonspecific fluid/small collection adjacent to the ventral abdominal hernia sac on the right measuring 1.6 cm. ??This appears increased compared to 04/02/17 but similar to 03/05/2017.  ????-Mild circumferential bladder wall thickening which may be due to contraction versus cystitis. Recommend correlation with urinalysis.  -Cirrhotic liver morphology.    09/11/2017 CT CAP s/p 8 weeks Cabozantinib  1. Stable to slight interval decrease in necrotic metastatic implants in the right renal fossa and necrotic aortocaval adenopathy. Stable perihepatic implant.   **Centrally necrotic 2.4 x 2.1 cm mass abutting and likely invading the adjacent right psoas muscle. This is not significantly changed from prior examination which time it measured 2.5 x 2.3 cm on remeasurement.   **3.1 x 1.9 cm necrotic mass (1:37), previously previously 3.8 x 2.2 cm.   **Aortocaval adenopathy measures up to 2.6 cm in short axis, previously 2.8 cm (1:30). A more inferior aortocaval node measures 2.5 cm, previously 2.7 cm (1:44).   2. Small amount of fluid along the right lateral aspect of the small and large bowel containing ventral hernia, nonspecific. No evidence of obstruction at the hernia site.-- he has seen a general surgeon recently regarding possible repair  3. Cirrhotic liver with small caliber gastrohepatic and esophageal varices.  4. No evidence of thoracic metastatic disease.    12/03/2017 CT CAP on Cabozantinib  1. Stable to slight interval decrease in necrotic metastatic implants in the right renal fossa and necrotic aortocaval adenopathy. Stable perihepatic implant.  2. Small amount of fluid along the right lateral aspect of the small and large bowel containing ventral hernia, nonspecific. No evidence of obstruction at the hernia site.  3. Cirrhotic liver with small caliber gastrohepatic and esophageal varices.  4. No evidence of thoracic metastatic disease.    CT CAP 04/30/2018  IMPRESSION:  Since 02/26/2018:  -Mildly increased size of necrotic peritoneal implants and retroperitoneal nodes with invasion into the right psoas and through the posterior abdominal wall and into the paraspinal musculature as described above.  -Focal small bowel thickening within the pelvis that is new. This may represent focal enteritis versus focal lesion. Close attention on follow-up.   -No other sites of disease identified.  -There is new small volume ascites which is likely related to cirrhosis and portal hypertension as evidenced by small caliber paraesophageal and upper abdominal varices.    Imaging reviewed from today; 07/09/2018; CT CAP --Status post right nephrectomy.  ??  --Mild interval increase in size of right retroperitoneal metastatic implants as above.  ??  --Large anterior  abdominal wall bowel-containing hernia with multiloculated rim enhancing pockets of fluid, some of them only slightly decreased but overall grossly unchanged from prior.    --LYMPH NODES: Interval progression of centrally necrotic extracapsular implant posterior to the hepatic segment 6, measuring 1.2 x 2.2 cm (previously 1.2 x 1.9 cm) (1:43).  Mildly increased right retroperitoneal centrally necrotic paraspinal and aortocaval metastases, as a reference:  -Right paraspinal lesion medial to the caudate lobe with invasion of the psoas muscle, intercostal muscle and paraspinal muscles, orbital measuring 6.8 x 2.9 cm (previously 6.0 x 2.6 cm) (1:38).   -Implant within the right psoas measures 1.8 x 2.2 cm (previously 1.7 x 2.4 cm).  -2.8 x 4.5 cm, aortocaval, previously 2.4 x 4.0 cm (1:55). The adjacent IVC is displaced laterally.    Pathology:    06/29/14:  Diagnosis:  A: ??Liver, segment 5, core needle biopsy  - Cirrhosis, etiology uncertain (see Light Microscopy) ??    B: ??Kidney and adrenal, right, radical nephrectomy, adrenalectomy, and caval  thrombectomy   Tumor histologic type/subtype: ??clear cell carcinoma  Sarcomatoid features:?????????? not identified   Histologic grade: Grade 2 (of 4) Fuhrman classification??????????  Tumor size: 11.5 cm diameter   Tumor focality: unifocal     Extent of invasion:??????????  ???? ?? Extra-capsular invasion into perirenal adipose tissue: not identified  ???? ?? Gerota's fascia: not involved   ???? ?? Renal sinus: not involved ??????????  ???? ?? Major veins (renal vein or segmental branches, IVC): involved (macro and  micro)  ???? ?? Ureter: not involved   ???? ?? Venous: ??involved   ???? ?? Lymphatic:??????????not involved   ???? ??   Histologic assessment of surgical margins:??????????  ???? ?? Peri-nephric adipose tissue margin (partial nephrectomy only): negative   ???? ?? Gerota's fascia: negative   ???? ?? Renal vein margin:??????????no adherent carcinoma identified on initial or  recuts of en face margin (B2) ??  ???? ?? Ureter margin: ??negative   Adrenal gland:?????????? negative   Lymph nodes:??????????none received     Pathologic findings in non-neoplastic kidney: focal global glomerulosclerosis   AJCC Stage (renal primary): ??pT3b (intrahepatic caval involvement) ?? pNx ?? pMx    C: ??Mass, retrocaval, excision   - Renal clear cell carcinoma, partially encapsulated, presumed confined by  Gerota's fascia, extending to uninked tissue edges    06/27/16:   Right nephrectomy bed, core biopsy and touch preparation:  - Diagnostic of malignancy.  - Morphology consistent with recurrence of patient's known clear cell renal cell carcinoma (Grade 2 of 4 Fuhrman Nuclear Grade).

## 2018-09-12 ENCOUNTER — Encounter (HOSPITAL_COMMUNITY): Payer: Self-pay | Admitting: Emergency Medicine

## 2018-09-12 ENCOUNTER — Other Ambulatory Visit: Payer: Self-pay

## 2018-09-12 ENCOUNTER — Emergency Department (HOSPITAL_COMMUNITY): Payer: Medicare Other

## 2018-09-12 ENCOUNTER — Inpatient Hospital Stay (HOSPITAL_COMMUNITY)
Admission: EM | Admit: 2018-09-12 | Discharge: 2018-09-18 | DRG: 871 | Disposition: A | Discharge status: Home / Self Care | Payer: Medicare Other | Attending: Internal Medicine | Admitting: Internal Medicine

## 2018-09-12 DIAGNOSIS — N183 Chronic kidney disease, stage 3 (moderate): Secondary | ICD-10-CM | POA: Diagnosis present

## 2018-09-12 DIAGNOSIS — N179 Acute kidney failure, unspecified: Secondary | ICD-10-CM | POA: Diagnosis present

## 2018-09-12 DIAGNOSIS — R0600 Dyspnea, unspecified: Secondary | ICD-10-CM

## 2018-09-12 DIAGNOSIS — C649 Malignant neoplasm of unspecified kidney, except renal pelvis: Secondary | ICD-10-CM | POA: Diagnosis present

## 2018-09-12 DIAGNOSIS — E039 Hypothyroidism, unspecified: Secondary | ICD-10-CM | POA: Diagnosis present

## 2018-09-12 DIAGNOSIS — Z79899 Other long term (current) drug therapy: Secondary | ICD-10-CM | POA: Diagnosis not present

## 2018-09-12 DIAGNOSIS — I2510 Atherosclerotic heart disease of native coronary artery without angina pectoris: Secondary | ICD-10-CM | POA: Diagnosis present

## 2018-09-12 DIAGNOSIS — Y95 Nosocomial condition: Secondary | ICD-10-CM | POA: Diagnosis present

## 2018-09-12 DIAGNOSIS — A419 Sepsis, unspecified organism: Principal | ICD-10-CM | POA: Diagnosis present

## 2018-09-12 DIAGNOSIS — J189 Pneumonia, unspecified organism: Secondary | ICD-10-CM | POA: Diagnosis present

## 2018-09-12 DIAGNOSIS — J440 Chronic obstructive pulmonary disease with acute lower respiratory infection: Secondary | ICD-10-CM | POA: Diagnosis present

## 2018-09-12 DIAGNOSIS — Z79890 Hormone replacement therapy (postmenopausal): Secondary | ICD-10-CM | POA: Diagnosis not present

## 2018-09-12 DIAGNOSIS — R54 Age-related physical debility: Secondary | ICD-10-CM | POA: Diagnosis present

## 2018-09-12 DIAGNOSIS — I129 Hypertensive chronic kidney disease with stage 1 through stage 4 chronic kidney disease, or unspecified chronic kidney disease: Secondary | ICD-10-CM | POA: Diagnosis present

## 2018-09-12 DIAGNOSIS — G9341 Metabolic encephalopathy: Secondary | ICD-10-CM | POA: Diagnosis present

## 2018-09-12 DIAGNOSIS — E872 Acidosis: Secondary | ICD-10-CM | POA: Diagnosis present

## 2018-09-12 DIAGNOSIS — E861 Hypovolemia: Secondary | ICD-10-CM | POA: Diagnosis present

## 2018-09-12 DIAGNOSIS — E871 Hypo-osmolality and hyponatremia: Secondary | ICD-10-CM

## 2018-09-12 DIAGNOSIS — K7030 Alcoholic cirrhosis of liver without ascites: Secondary | ICD-10-CM | POA: Diagnosis present

## 2018-09-12 DIAGNOSIS — R531 Weakness: Secondary | ICD-10-CM | POA: Diagnosis present

## 2018-09-12 LAB — BASIC METABOLIC PANEL
Anion gap: 10 (ref 5–15)
BUN: 61 mg/dL — ABNORMAL HIGH (ref 8–23)
CO2: 11 mmol/L — ABNORMAL LOW (ref 22–32)
CREATININE: 2.7 mg/dL — AB (ref 0.61–1.24)
Calcium: 7.3 mg/dL — ABNORMAL LOW (ref 8.9–10.3)
Chloride: 98 mmol/L (ref 98–111)
GFR calc non Af Amer: 23 mL/min — ABNORMAL LOW (ref >60)
GFR, EST AFRICAN AMERICAN: 27 mL/min — AB (ref >60)
Glucose, Bld: 94 mg/dL (ref 70–99)
Potassium: 4.9 mmol/L (ref 3.5–5.1)
Sodium: 119 mmol/L — CL (ref 135–145)

## 2018-09-12 LAB — TSH: TSH: 16.088 u[IU]/mL — AB (ref 0.350–4.500)

## 2018-09-12 LAB — URINALYSIS, ROUTINE W REFLEX MICROSCOPIC
Bacteria, UA: NONE SEEN
Bilirubin Urine: NEGATIVE
Glucose, UA: NEGATIVE mg/dL
KETONES UR: NEGATIVE mg/dL
Leukocytes,Ua: NEGATIVE
Nitrite: NEGATIVE
Protein, ur: 30 mg/dL — AB
Specific Gravity, Urine: 1.016 (ref 1.005–1.030)
pH: 6 (ref 5.0–8.0)

## 2018-09-12 LAB — CBC WITH DIFFERENTIAL/PLATELET
Abs Immature Granulocytes: 0.96 10*3/uL — ABNORMAL HIGH (ref 0.00–0.07)
BASOS PCT: 0 %
Basophils Absolute: 0.1 10*3/uL (ref 0.0–0.1)
Eosinophils Absolute: 0 10*3/uL (ref 0.0–0.5)
Eosinophils Relative: 0 %
HCT: 44.7 % (ref 39.0–52.0)
Hemoglobin: 15.3 g/dL (ref 13.0–17.0)
Immature Granulocytes: 6 %
Lymphocytes Relative: 5 %
Lymphs Abs: 0.8 10*3/uL (ref 0.7–4.0)
MCH: 31.1 pg (ref 26.0–34.0)
MCHC: 34.2 g/dL (ref 30.0–36.0)
MCV: 90.9 fL (ref 80.0–100.0)
Monocytes Absolute: 1 10*3/uL (ref 0.1–1.0)
Monocytes Relative: 6 %
Neutro Abs: 13.4 10*3/uL — ABNORMAL HIGH (ref 1.7–7.7)
Neutrophils Relative %: 83 %
Platelets: 170 10*3/uL (ref 150–400)
RBC: 4.92 MIL/uL (ref 4.22–5.81)
RDW: 14.6 % (ref 11.5–15.5)
WBC: 16.2 10*3/uL — ABNORMAL HIGH (ref 4.0–10.5)
nRBC: 0 % (ref 0.0–0.2)

## 2018-09-12 LAB — COMPREHENSIVE METABOLIC PANEL
ALBUMIN: 2.9 g/dL — AB (ref 3.5–5.0)
ALT: 37 U/L (ref 0–44)
AST: 94 U/L — ABNORMAL HIGH (ref 15–41)
Alkaline Phosphatase: 169 U/L — ABNORMAL HIGH (ref 38–126)
Anion gap: 17 — ABNORMAL HIGH (ref 5–15)
BUN: 71 mg/dL — AB (ref 8–23)
CHLORIDE: 90 mmol/L — AB (ref 98–111)
CO2: 11 mmol/L — ABNORMAL LOW (ref 22–32)
CREATININE: 3.36 mg/dL — AB (ref 0.61–1.24)
Calcium: 8.8 mg/dL — ABNORMAL LOW (ref 8.9–10.3)
GFR calc Af Amer: 20 mL/min — ABNORMAL LOW (ref >60)
GFR calc non Af Amer: 18 mL/min — ABNORMAL LOW (ref >60)
GLUCOSE: 96 mg/dL (ref 70–99)
POTASSIUM: 5 mmol/L (ref 3.5–5.1)
Sodium: 118 mmol/L — CL (ref 135–145)
Total Bilirubin: 1.2 mg/dL (ref 0.3–1.2)
Total Protein: 8.2 g/dL — ABNORMAL HIGH (ref 6.5–8.1)

## 2018-09-12 LAB — MRSA PCR SCREENING: MRSA by PCR: NEGATIVE

## 2018-09-12 LAB — AMMONIA: Ammonia: 22 umol/L (ref 9–35)

## 2018-09-12 LAB — INFLUENZA PANEL BY PCR (TYPE A & B)
Influenza A By PCR: NEGATIVE
Influenza B By PCR: NEGATIVE

## 2018-09-12 LAB — PROTIME-INR
INR: 1.15
Prothrombin Time: 14.6 seconds (ref 11.4–15.2)

## 2018-09-12 LAB — LACTIC ACID, PLASMA
LACTIC ACID, VENOUS: 2.7 mmol/L — AB (ref 0.5–1.9)
Lactic Acid, Venous: 2.7 mmol/L (ref 0.5–1.9)
Lactic Acid, Venous: 2.2 mmol/L (ref 0.5–1.9)

## 2018-09-12 LAB — PROCALCITONIN: PROCALCITONIN: 34.67 ng/mL

## 2018-09-12 LAB — LIPASE, BLOOD: Lipase: 64 U/L — ABNORMAL HIGH (ref 11–51)

## 2018-09-12 MED ORDER — ACETAMINOPHEN 325 MG PO TABS: 650.0000 mg | ORAL_TABLET | Freq: Four times a day (QID) | ORAL | Status: DC | PRN

## 2018-09-12 MED ORDER — VANCOMYCIN HCL IN DEXTROSE 750-5 MG/150ML-% IV SOLN: 750.0000 mg | INTRAVENOUS | Status: DC

## 2018-09-12 MED ORDER — LEVOTHYROXINE SODIUM 112 MCG PO TABS
112.0000 ug | ORAL_TABLET | Freq: Every day | ORAL | Status: DC
  Administered 2018-09-13 – 2018-09-14 (×2): 112 ug via ORAL
  Filled 2018-09-12 (×3): qty 1

## 2018-09-12 MED ORDER — ONDANSETRON HCL 4 MG PO TABS: 4.0000 mg | ORAL_TABLET | Freq: Four times a day (QID) | ORAL | Status: DC | PRN

## 2018-09-12 MED ORDER — SODIUM CHLORIDE 0.9 % IV BOLUS
1000.0000 mL | Freq: Once | INTRAVENOUS | Status: AC
  Administered 2018-09-12: 1000 mL via INTRAVENOUS

## 2018-09-12 MED ORDER — SODIUM CHLORIDE 0.9% FLUSH
3.0000 mL | Freq: Once | INTRAVENOUS | Status: AC
  Administered 2018-09-12: 3 mL via INTRAVENOUS

## 2018-09-12 MED ORDER — ENOXAPARIN SODIUM 40 MG/0.4ML ~~LOC~~ SOLN
40.0000 mg | Freq: Every day | SUBCUTANEOUS | Status: DC
  Administered 2018-09-13 – 2018-09-18 (×6): 40 mg via SUBCUTANEOUS
  Filled 2018-09-12 (×6): qty 0.4

## 2018-09-12 MED ORDER — SODIUM CHLORIDE 0.9 % IV SOLN
INTRAVENOUS | Status: DC
  Administered 2018-09-12: 20:00:00 via INTRAVENOUS

## 2018-09-12 MED ORDER — VANCOMYCIN HCL 10 G IV SOLR
1250.0000 mg | INTRAVENOUS | Status: AC
  Administered 2018-09-12: 1250 mg via INTRAVENOUS
  Filled 2018-09-12: qty 1250

## 2018-09-12 MED ORDER — ONDANSETRON HCL 4 MG/2ML IJ SOLN
4.0000 mg | Freq: Four times a day (QID) | INTRAMUSCULAR | Status: DC | PRN
  Administered 2018-09-14 (×2): 4 mg via INTRAVENOUS
  Filled 2018-09-12 (×2): qty 2

## 2018-09-12 MED ORDER — ACETAMINOPHEN 500 MG PO TABS
1000.0000 mg | ORAL_TABLET | Freq: Once | ORAL | Status: AC
  Administered 2018-09-12: 1000 mg via ORAL
  Filled 2018-09-12: qty 2

## 2018-09-12 MED ORDER — ACETAMINOPHEN 650 MG RE SUPP: 650.0000 mg | Freq: Four times a day (QID) | RECTAL | Status: DC | PRN

## 2018-09-12 MED ORDER — SODIUM CHLORIDE 0.9 % IV SOLN
1.0000 g | Freq: Two times a day (BID) | INTRAVENOUS | Status: DC
  Administered 2018-09-13 – 2018-09-18 (×11): 1 g via INTRAVENOUS
  Filled 2018-09-12 (×13): qty 1

## 2018-09-12 MED ORDER — SODIUM CHLORIDE 0.9 % IV SOLN
2.0000 g | INTRAVENOUS | Status: AC
  Administered 2018-09-12: 2 g via INTRAVENOUS
  Filled 2018-09-12: qty 2

## 2018-09-12 NOTE — ED Notes (Signed)
CRITICAL VALUE STICKER  CRITICAL VALUE: sodium = 118  RECEIVER (on-site recipient of call): A.Yarden Hillis,Rn  DATE & TIME NOTIFIED: 09/12/2018 1605  MESSENGER (representative from lab): Joelene Millin  MD NOTIFIED: A.Curatolo,MD  TIME OF NOTIFICATION:09/12/2018 1605  RESPONSE: See new orders

## 2018-09-12 NOTE — Progress Notes (Signed)
A consult was received from an ED physician for Cefepime and Vancomycin per pharmacy dosing.  The patient's profile has been reviewed for ht/wt/allergies/indication/available labs.   A one time order has been placed for Cefepime 2g IV, Vancomycin 1250 mg IV.  Further antibiotics/pharmacy consults should be ordered by admitting physician if indicated.                       Thank you,  Gretta Arab PharmD, BCPS Pager [phone] 09/12/2018 3:38 PM

## 2018-09-12 NOTE — ED Notes (Signed)
CRITICAL VALUE STICKER  CRITICAL VALUE: Lactic Acid 2.7  RECEIVER (on-site recipient of call): A.Donasia Wimes,RN  DATE & TIME NOTIFIED: 09/12/2018 1658  MESSENGER (representative from lab): Nunzio Cory  MD NOTIFIED: A.Curatolo,MD  TIME OF NOTIFICATION: 09/12/2018 1658  RESPONSE: No new orders

## 2018-09-12 NOTE — H&P (Signed)
[facility] History and Physical  Stephen Grimes LFY:[id number] DOB: [birth year] DOA: 09/12/2018   PCP: Stephens Shire, MD  Specialists: Patient followed by oncology at [facility]  Chief Complaint: Poor oral intake, confusion  HPI: Stephen Grimes is a 68 y.o. male with a past medical history of metastatic renal cell carcinoma, alcoholic liver cirrhosis, essential hypertension, hypothyroidism who was brought in by family members as patient has had very poor oral intake the last few days.  He is usually an active person and has not been doing much in the last 24 hours.  He has had nausea and vomiting.  Has been noted to be short of breath.  He has had episodes of diarrhea.  He was noted to be confused.  Patient is confused and distracted at this time.  History is very limited.  His wife is at the bedside who also does not appear to know much and defers to the patient.  He denies any pain at this time.  Unclear if he had any fever at home.  His wife has noticed that the patient has been coughing.  History is otherwise limited.  In the emergency department patient revealed hyponatremia with a sodium level of 118.  He was found to have acute renal failure with BUN of 71 and creatinine of 3.36.  Noted to have mildly elevated lipase level at 64.  Ammonia was normal.  Lactic acid level was 2.7.  WBC 16.2.  Chest x-ray raised concern for left lung consolidation.  Home Medications: Prior to Admission medications   Medication Sig Start Date End Date Taking? Authorizing Provider  AFINITOR 5 MG tablet Take 5 mg by mouth daily. 08/26/18  Yes [provider]  amLODipine (NORVASC) 10 MG tablet Take 10 mg by mouth daily.  05/28/18 05/28/19 Yes [provider]  cabozantinib (CABOMETYX) 20 MG tablet TAKE 1 TABLET BY MOUTH ONCE DAILY AT 6:00 AM 04/16/18  Yes [provider]  cholestyramine (QUESTRAN) 4 G packet Take 4 g by mouth 2 (two) times daily.   Yes [provider]  LENVIMA,  18 MG DAILY DOSE, 10 MG & 2 x 4 MG capsule Take by mouth See admin instructions. Take  One 10 mg tablet and two 4 mg tablets daily. 08/26/18  Yes [provider]  levothyroxine (SYNTHROID, LEVOTHROID) 112 MCG tablet TK 1 T PO QAM ON AN EMPTY STOMACH 03/23/18  Yes [provider]  losartan (COZAAR) 25 MG tablet Take 25 mg by mouth daily. 08/12/18  Yes [provider]  multivitamin (RENA-VIT) TABS tablet Take 1 tablet daily 12/11/17  Yes [provider]  omeprazole (PRILOSEC) 20 MG capsule TAKE 1 CAPSULE BY MOUTH ONCE DAILY 03/26/18  Yes [provider]  ondansetron (ZOFRAN-ODT) 8 MG disintegrating tablet Take 8 mg by mouth 3 (three) times daily as needed for nausea/vomiting. 09/03/18  Yes [provider]  urea (CARMOL) 40 % CREA Apply twice daily to sore areas on hands and feet 05/28/18  Yes [provider]    Allergies: No Known Allergies  Past Medical History: Past Medical History:  Diagnosis Date  . Cancer [facility])    kidney cancer  . Cirrhosis ([place])   . COPD (chronic obstructive pulmonary disease) ([place])   . Coronary artery disease   . Hypertension   . Renal disorder   . Sleep apnea   . Thrombocytopathia ([place])   . Thrombocytopenia ([place]) 12/11/2011  . Thyroid disease     Past Surgical History:  Procedure  Laterality Date  . kidney removed Right   . TESTICLE REMOVAL      Social History: He lives with his wife.  Apparently does not smoke cigarettes or drink alcohol anymore.  Usually active.   Family History: Unable to do due to his confusion  Review of Systems -Unable to do due to his confusion  Physical Examination  Vitals:   09/12/18 1600 09/12/18 1630 09/12/18 1700 09/12/18 1730  BP: (!) 144/92 140/90 131/85 123/82  Pulse: 79 76 76 76  Resp: (!) 26 (!) 27 (!) 23 (!) 28  Temp:    97.8 F (36.6 C)  TempSrc:    Oral  SpO2: 97% 97% 98% 98%  Weight:      Height:        BP 123/82   Pulse 76   Temp 97.8 F (36.6 C)  (Oral)   Resp (!) 28   Ht 5\' 7"  (1.702 m)   Wt 63 kg   SpO2 98%   BMI 21.77 kg/m   General appearance: alert, distracted and no distress Head: Normocephalic, without obvious abnormality, atraumatic Eyes: conjunctivae/corneas clear. PERRL, EOM's intact.  Throat: dry mucous membranes. Neck: no adenopathy, no carotid bruit, no JVD, supple, symmetrical, trachea midline and thyroid not enlarged, symmetric, no tenderness/mass/nodules Resp: Noted to be mildly tachypneic.  Coarse breath sounds bilaterally.  Crackles at the left base.  No wheezing or rhonchi. Cardio: regular rate and rhythm, S1, S2 normal, no murmur, click, rub or gallop GI: Abdomen is noted to be soft.  Nontender nondistended.  Bowel sounds present normal.  No masses organomegaly. Extremities: extremities normal, atraumatic, no cyanosis or edema Pulses: 2+ and symmetric Skin: Skin color, texture, turgor normal. No rashes or lesions Lymph nodes: Cervical, supraclavicular, and axillary nodes normal. Neurologic: He is awake alert.  Distracted.  Cranial nerves II to XII intact.  Motor strength equal bilateral upper and lower extremity.  Oriented to person.  Disoriented to place year.   Labs on Admission: I have personally reviewed following labs and imaging studies  CBC: Recent Labs  Lab 09/12/18 1500  WBC 16.2*  NEUTROABS 13.4*  HGB 15.3  HCT 44.7  MCV 90.9  PLT 235   Basic Metabolic Panel: Recent Labs  Lab 09/12/18 1500  NA 118*  K 5.0  CL 90*  CO2 11*  GLUCOSE 96  BUN 71*  CREATININE 3.36*  CALCIUM 8.8*   GFR: Estimated Creatinine Clearance: 18.5 mL/min (A) (by C-G formula based on SCr of 3.36 mg/dL (H)). Liver Function Tests: Recent Labs  Lab 09/12/18 1500  AST 94*  ALT 37  ALKPHOS 169*  BILITOT 1.2  PROT 8.2*  ALBUMIN 2.9*   Recent Labs  Lab 09/12/18 1500  LIPASE 64*   Recent Labs  Lab 09/12/18 1500  AMMONIA 22   Coagulation Profile: Recent Labs  Lab 09/12/18 1500  INR 1.15     Radiological Exams on Admission: Dg Chest 2 View  Result Date: 09/12/2018 CLINICAL DATA:  Generalized weakness and vomiting. Patient is being treated with chemotherapy for renal cancer. EXAM: CHEST - 2 VIEW COMPARISON:  None. FINDINGS: Cardiomediastinal silhouette is normal. Mediastinal contours appear intact. There is no evidence of pleural effusion or pneumothorax. Dense airspace consolidation in the posterior segment of the left upper lobe Osseous structures are without acute abnormality. Soft tissues are grossly normal. IMPRESSION: Dense airspace consolidation in the posterior segment of the left upper lobe. In the acute clinical settings this likely represents pneumonia, however please follow-up to resolution  after empiric treatment to exclude underlying pulmonary mass. Electronically Signed   By: Fidela Salisbury M.D.   On: 09/12/2018 15:55    My interpretation of Electrocardiogram: Poor quality EKG.  Appears to be sinus rhythm with heart rate in the 80s.  No obvious ST or T wave changes noted.   Problem List  Active Problems:   AKI (acute kidney injury) ([place])   Alcoholic cirrhosis of liver without ascites (HCC)   Hypothyroidism, unspecified   Malignant neoplasm of unspecified kidney, except renal pelvis ([place])   HCAP (healthcare-associated pneumonia)   Hyponatremia   Acute metabolic encephalopathy   Sepsis ([place])   Assessment: This is a 68 year old African-American male with a past medical history as stated earlier who comes in with poor oral intake, nausea, vomiting diarrhea, cough and confusion.  History is very limited from the patient and from family members.  He is found to have numerous metabolic derangements.  He has acute metabolic encephalopathy.  However no focal neurological deficits are noted.  Plan:  1. Sepsis, present on admission, most likely due to healthcare associated pneumonia: Patient is immunosuppressed from taking chemotherapy for his metastatic renal  cell cancer.  This could also be aspiration pneumonia as he has had nausea and vomiting.  Lactic acid level noted to be 2.7.  We will check procalcitonin.  Blood cultures have been sent.  Influenza PCR negative.  Check respiratory viral panel.  Place him on broad-spectrum antibiotics with vancomycin and cefepime for now.  IV fluid boluses.  Recheck lactic acid levels.  2.  Hyponatremia: Most likely due to hypovolemia.  Care everywhere was reviewed.  His sodium was normal on February 12 when checked at [facility].  He has been given fluid boluses.  We will recheck his labs tonight and tomorrow.  Will aim for gradual increase in sodium levels.  3. Nausea vomiting and diarrhea: Etiology unclear.  His abdomen is benign.  Could be due to his medications.  He had a loose stool in the emergency department which was not quite watery.  Continue to monitor for now.  If he has watery stools then may have to consider testing it.  4.  Acute renal failure: Creatinine was 1.36 on February 12 at [facility].  Noted to be significantly elevated.  This is all likely to be hypovolemic.  Give IV fluids.  Monitor urine output.  If renal function does not improve will need to do a renal ultrasound.  He has made urine in the emergency department with IV fluids.  5.  Acute metabolic encephalopathy: Most likely due to sepsis and hyponatremia.  He does not have any focal neurological deficits.  No clear indication for neuroimaging at this time.  Ammonia was normal.  Speech therapy to see for swallow evaluation.  6.  History of alcoholic cirrhosis: Patient's wife does not appear to be aware of this diagnosis.  AST noted to be mildly elevated.  Bilirubin normal at 1.2.  7.  Hypothyroidism: Continue with home medications.  8.  History of metastatic renal cell cancer: He is followed at [facility].  He is on everolimus, lenvatinib.  Previously he was on cabozantinib.  These medications can be resumed once his mental status is improved.  DVT  Prophylaxis: Lovenox Code Status: Full code Family Communication: Discussed with the patient his wife and niece Disposition: Disposition remains unclear.  Will depend on his clinical recovery. Consults called: None for now Admission Status: Inpatient  Severity of Illness: The appropriate patient status for this patient is  INPATIENT. Inpatient status is judged to be reasonable and necessary in order to provide the required intensity of service to ensure the patient's safety. The patient's presenting symptoms, physical exam findings, and initial radiographic and laboratory data in the context of their chronic comorbidities is felt to place them at high risk for further clinical deterioration. Furthermore, it is not anticipated that the patient will be medically stable for discharge from the hospital within 2 midnights of admission. The following factors support the patient status of inpatient.   " The patient's presenting symptoms include nausea vomiting, confusion. " The worrisome physical exam findings include dehydration. " The initial radiographic and laboratory data are worrisome because of hyponatremia, acute renal failure. " The chronic co-morbidities include metastatic renal cell cancer.   * I certify that at the point of admission it is my clinical judgment that the patient will require inpatient hospital care spanning beyond 2 midnights from the point of admission due to high intensity of service, high risk for further deterioration and high frequency of surveillance required.*  Further management decisions will depend on results of further testing and patient's response to treatment.  Stephen Grimes  [facility] operational officer on [email or web address]  09/12/2018, 6:26 PM

## 2018-09-12 NOTE — ED Provider Notes (Addendum)
[facility] DEPT Provider Note   CSN: [id number] Arrival date & time: 09/12/18  1348    History   Chief Complaint Chief Complaint  Patient presents with  . Weakness    HPI Stephen Grimes is a 67 y.o. male.     The history is provided by the patient and the spouse.  Weakness  Severity:  Moderate Onset quality:  Gradual Timing:  Constant Progression:  Worsening Chronicity:  New Context comment:  Hx of renal cancer on chemotherapy, with increased fatigue, cough, hiccups, possible fever.  Relieved by:  Nothing Worsened by:  Nothing Associated symptoms: anorexia, cough, fever, lethargy and nausea   Associated symptoms: no abdominal pain, no arthralgias, no chest pain, no dysuria, no seizures, no shortness of breath and no vomiting     Past Medical History:  Diagnosis Date  . Cancer [facility])    kidney cancer  . Cirrhosis ([place])   . COPD (chronic obstructive pulmonary disease) ([place])   . Coronary artery disease   . Hypertension   . Renal disorder   . Sleep apnea   . Thrombocytopathia ([place])   . Thrombocytopenia ([place]) 12/11/2011  . Thyroid disease     Patient Active Problem List   Diagnosis Date Noted  . Cirrhosis of liver ([place]) 06/10/2018  . Encounter for long-term (current) use of high-risk medication 06/10/2018  . Thrombocytopathia ([place]) 06/10/2018  . Metastatic renal cell carcinoma to lymph node ([place]) 08/31/2016  . Alcoholic cirrhosis of liver without ascites ([place]) 03/19/2016  . ED (erectile dysfunction) 03/19/2016  . Malignant neoplasm of unspecified kidney, except renal pelvis ([place]) 03/19/2016  . AKI (acute kidney injury) ([place]) 07/02/2014  . Essential (primary) hypertension 07/02/2014  . Hypothyroidism, unspecified 07/02/2014  . Thrombocytopenia ([place]) 12/11/2011    Past Surgical History:  Procedure Laterality Date  . kidney removed Right   . TESTICLE REMOVAL          Home Medications    Prior to Admission medications     Medication Sig Start Date End Date Taking? Authorizing Provider  AFINITOR 5 MG tablet Take 5 mg by mouth daily. 08/26/18  Yes [provider]  amLODipine (NORVASC) 10 MG tablet Take by mouth. 05/28/18 05/28/19 Yes [provider]  [place], 18 MG DAILY DOSE, 10 MG & 2 x 4 MG capsule Take by mouth See admin instructions. Take  One 10 mg tablet and two 4 mg tablets daily. 08/26/18  Yes [provider]  losartan (COZAAR) 25 MG tablet Take 25 mg by mouth daily. 08/12/18  Yes [provider]  acetaminophen (TYLENOL) 160 MG/5ML liquid Take 650 mg by mouth every 4 (four) hours as needed for pain.    [provider]  cabozantinib (CABOMETYX) 20 MG tablet TAKE 1 TABLET BY MOUTH ONCE DAILY AT 6:00 AM 04/16/18   [provider]  cholestyramine Lucrezia Starch) 4 G packet Take 4 g by mouth 2 (two) times daily.    [provider]  ciprofloxacin (CIPRO) 500 MG tablet Take 500 mg by mouth 2 (two) times daily.    [provider]  docusate sodium (COLACE) 100 MG capsule Take 100 mg by mouth 2 (two) times daily.    [provider]  levothyroxine (SYNTHROID, LEVOTHROID) 112 MCG tablet TK 1 T PO QAM ON AN EMPTY STOMACH 03/23/18   [provider]  levothyroxine (SYNTHROID, LEVOTHROID) 50 MCG tablet Take 50 mcg by mouth Daily. 11/20/11   [provider]  metroNIDAZOLE (FLAGYL) 500 MG tablet Take  500 mg by mouth 3 (three) times daily.    [provider]  multivitamin (RENA-VIT) TABS tablet Take 1 tablet daily 12/11/17   [provider]  omeprazole (PRILOSEC) 20 MG capsule TAKE 1 CAPSULE BY MOUTH ONCE DAILY 03/26/18   [provider]  ondansetron (ZOFRAN-ODT) 8 MG disintegrating tablet Take 8 mg by mouth 3 (three) times daily as needed for nausea/vomiting. 09/03/18   [provider]  oxyCODONE (OXY IR/ROXICODONE) 5 MG immediate release tablet Take 5 mg by mouth every 4 (four) hours as needed for severe pain.     [provider]  pantoprazole (PROTONIX) 40 MG tablet Take 40 mg by mouth daily.    [provider]  senna (SENOKOT) 8.6 MG tablet Take 1 tablet by mouth at bedtime.    [provider]  tamsulosin (FLOMAX) 0.4 MG CAPS capsule Take 0.4 mg by mouth daily.    [provider]  urea (CARMOL) 40 % CREA Apply twice daily to sore areas on hands and feet 05/28/18   [provider]    Family History No family history on file.  Social History Social History   Tobacco Use  . Smoking status: Never Smoker  . Smokeless tobacco: Never Used  Substance Use Topics  . Alcohol use: No  . Drug use: Not on file     Allergies   Patient has no known allergies.   Review of Systems Review of Systems  Constitutional: Positive for appetite change, fatigue and fever. Negative for chills.  HENT: Negative for ear pain and sore throat.   Eyes: Negative for pain and visual disturbance.  Respiratory: Positive for cough. Negative for shortness of breath and wheezing.   Cardiovascular: Negative for chest pain and palpitations.  Gastrointestinal: Positive for anorexia and nausea. Negative for abdominal distention, abdominal pain and vomiting.       Abdominal hernia  Genitourinary: Negative for dysuria and hematuria.  Musculoskeletal: Negative for arthralgias and back pain.  Skin: Negative for color change and rash.  Neurological: Positive for weakness. Negative for seizures and syncope.  All other systems reviewed and are negative.    Physical Exam Updated Vital Signs BP (!) 144/92   Pulse 79   Temp (!) 101.8 F (38.8 C) (Rectal)   Resp (!) 26   Ht 5\' 7"  (1.702 m)   Wt 63 kg   SpO2 97%   BMI 21.77 kg/m   Physical Exam Vitals signs and nursing note reviewed.  Constitutional:      Appearance: He is well-developed. He is ill-appearing (chronic).  HENT:     Head: Normocephalic and atraumatic.     Nose: Nose normal.     Mouth/Throat:     Mouth: Mucous  membranes are dry.  Eyes:     Extraocular Movements: Extraocular movements intact.     Conjunctiva/sclera: Conjunctivae normal.     Pupils: Pupils are equal, round, and reactive to light.  Neck:     Musculoskeletal: Normal range of motion and neck supple.  Cardiovascular:     Rate and Rhythm: Normal rate and regular rhythm.     Pulses: Normal pulses.     Heart sounds: Normal heart sounds. No murmur.  Pulmonary:     Effort: Pulmonary effort is normal. No respiratory distress.     Breath sounds: Normal breath sounds.  Abdominal:     General: There is no distension.     Palpations: Abdomen is soft.     Tenderness: There is no abdominal tenderness.  There is no guarding.     Hernia: A hernia is present.  Musculoskeletal: Normal range of motion.     Right lower leg: No edema.     Left lower leg: No edema.  Skin:    General: Skin is warm and dry.     Capillary Refill: Capillary refill takes less than 2 seconds.  Neurological:     General: No focal deficit present.     Mental Status: He is alert.  Psychiatric:        Mood and Affect: Mood normal.      ED Treatments / Results  Labs (all labs ordered are listed, but only abnormal results are displayed) Labs Reviewed  COMPREHENSIVE METABOLIC PANEL - Abnormal; Notable for the following components:      Result Value   Sodium 118 (*)    Chloride 90 (*)    CO2 11 (*)    BUN 71 (*)    Creatinine, Ser 3.36 (*)    Calcium 8.8 (*)    Total Protein 8.2 (*)    Albumin 2.9 (*)    AST 94 (*)    Alkaline Phosphatase 169 (*)    GFR calc non Af Amer 18 (*)    GFR calc Af Amer 20 (*)    Anion gap 17 (*)    All other components within normal limits  LACTIC ACID, PLASMA - Abnormal; Notable for the following components:   Lactic Acid, Venous 2.7 (*)    All other components within normal limits  CBC WITH DIFFERENTIAL/PLATELET - Abnormal; Notable for the following components:   WBC 16.2 (*)    Neutro Abs 13.4 (*)    Abs Immature  Granulocytes 0.96 (*)    All other components within normal limits  LIPASE, BLOOD - Abnormal; Notable for the following components:   Lipase 64 (*)    All other components within normal limits  CULTURE, BLOOD (ROUTINE X 2)  CULTURE, BLOOD (ROUTINE X 2)  URINE CULTURE  PROTIME-INR  AMMONIA  LACTIC ACID, PLASMA  URINALYSIS, ROUTINE W REFLEX MICROSCOPIC  INFLUENZA PANEL BY PCR (TYPE A & B)    EKG EKG Interpretation  Date/Time:  Friday September 12 2018 16:29:47 EST Ventricular Rate:  80 PR Interval:    QRS Duration: 99 QT Interval:  376 QTC Calculation: 434 R Axis:   78 Text Interpretation:  Sinus rhythm Low voltage, extremity leads Confirmed by Lennice Sites [phone]) on 09/12/2018 4:35:20 PM   Radiology Dg Chest 2 View  Result Date: 09/12/2018 CLINICAL DATA:  Generalized weakness and vomiting. Patient is being treated with chemotherapy for renal cancer. EXAM: CHEST - 2 VIEW COMPARISON:  None. FINDINGS: Cardiomediastinal silhouette is normal. Mediastinal contours appear intact. There is no evidence of pleural effusion or pneumothorax. Dense airspace consolidation in the posterior segment of the left upper lobe Osseous structures are without acute abnormality. Soft tissues are grossly normal. IMPRESSION: Dense airspace consolidation in the posterior segment of the left upper lobe. In the acute clinical settings this likely represents pneumonia, however please follow-up to resolution after empiric treatment to exclude underlying pulmonary mass. Electronically Signed   By: Fidela Salisbury M.D.   On: 09/12/2018 15:55    Procedures .Critical Care Performed by: Lennice Sites, DO Authorized by: Lennice Sites, DO   Critical care provider statement:    Critical care time (minutes):  40   Critical care time was exclusive of:  Separately billable procedures and treating other patients and teaching time   Critical care  was necessary to treat or prevent imminent or life-threatening  deterioration of the following conditions:  Renal failure, sepsis and metabolic crisis   Critical care was time spent personally by me on the following activities:  Blood draw for specimens, development of treatment plan with patient or surrogate, discussions with primary provider, evaluation of patient's response to treatment, examination of patient, obtaining history from patient or surrogate, ordering and performing treatments and interventions, ordering and review of laboratory studies, pulse oximetry, ordering and review of radiographic studies, re-evaluation of patient's condition and review of old charts   I assumed direction of critical care for this patient from another provider in my specialty: no     (including critical care time)  Medications Ordered in ED Medications  ceFEPIme (MAXIPIME) 2 g in sodium chloride 0.9 % 100 mL IVPB (2 g Intravenous New Bag/Given 09/12/18 1615)  vancomycin (VANCOCIN) 1,250 mg in sodium chloride 0.9 % 250 mL IVPB (has no administration in time range)  sodium chloride flush (NS) 0.9 % injection 3 mL (3 mLs Intravenous Given 09/12/18 1623)  sodium chloride 0.9 % bolus 1,000 mL (1,000 mLs Intravenous Bolus 09/12/18 1501)  acetaminophen (TYLENOL) tablet 1,000 mg (1,000 mg Oral Given 09/12/18 1616)     Initial Impression / Assessment and Plan / ED Course  I have reviewed the triage vital signs and the nursing notes.  Pertinent labs & imaging results that were available during my care of the patient were reviewed by me and considered in my medical decision making (see chart for details).        Stephen Grimes is a 68 year old male with history of renal cell cancer, alcoholic cirrhosis who presents to the ED with weakness, mental status change.  Patient febrile, tachypneic and concern for sepsis.  Empirically given cefepime and vancomycin.  Sepsis orders initiated.  We will get ammonia level as well given his history of cirrhosis.  Patient appears  neurologically intact.  Is slow to answer questions but is able to talk without any issues.  Family member states that patient has had decreased appetite and not eating and drinking much.  Has had some nausea and some vomiting.  Patient appears chronically ill on exam.  Does not have any abdominal tenderness on exam.  He has large ventral hernia that is easily reducible.  Will give normal saline bolus.  Lactic acid 2.7.  White count 16.  Chest x-ray showing pneumonia.  Likely sepsis from pneumonia possible aspiration.  Sodium also 118 with elevated BUN and elevated creatinine.  Likely from hypovolemia.  Patient has not had any seizures.  Hemodynamically stable otherwise.  Given Tylenol for fever as well.  Patient septic and with multiple electrolyte abnormalities and kidney injury.  Admitted to hospitalist service for further care.  This chart was dictated using voice recognition software.  Despite best efforts to proofread,  errors can occur which can change the documentation meaning.     Final Clinical Impressions(s) / ED Diagnoses   Final diagnoses:  Hyponatremia  Sepsis, due to unspecified organism, unspecified whether acute organ dysfunction present [facility])  Community acquired pneumonia, unspecified laterality    ED Discharge Orders    None       Lennice Sites, DO 09/12/18 [place], [place], DO 09/12/18 1635

## 2018-09-12 NOTE — ED Notes (Signed)
Date and time results received: 09/12/18 2057  Test: Sodium 119 and Lactic Acid 2.2 Critical Value: see above  Name of Provider Notified: [facility]-mid level coverage  Orders Received? Or Actions Taken?: awaiting orders

## 2018-09-12 NOTE — ED Notes (Signed)
Primary RN also made aware of critical lab values

## 2018-09-12 NOTE — Progress Notes (Signed)
Pharmacy Antibiotic Note  Stephen Grimes is a 68 y.o. male admitted on 09/12/2018 with pneumonia and sepsis.  Pharmacy has been consulted for Vancomycin, Cefepime dosing.  Plan: Cefepime 2g IV x1, then 1g IV q12h Vancomycin 1250 mg IV x1, then 750 mg IV Q 48 hrs. Goal AUC 400-550. Expected AUC: 417 using SCr 3.36 and TBW (TBW < IBW) Follow up renal fxn, culture results, and clinical course. F/u ability to de-escalate antibiotics.   Height: 5\' 7"  (170.2 cm) Weight: 139 lb (63 kg) IBW/kg (Calculated) : 66.1  Temp (24hrs), Avg:99.8 F (37.7 C), Min:97.8 F (36.6 C), Max:101.8 F (38.8 C)  Recent Labs  Lab 09/12/18 1500 09/12/18 1622  WBC 16.2*  --   CREATININE 3.36*  --   LATICACIDVEN 2.7* 2.7*    Estimated Creatinine Clearance: 18.5 mL/min (A) (by C-G formula based on SCr of 3.36 mg/dL (H)).    No Known Allergies  Antimicrobials this admission:  2/21 Cefepime >>  2/21 Vancomycin >>   Dose adjustments this admission:    Microbiology results:  2/21 BCx:  2/21 UCx:  2/21 MRSA PCR: 2/21 Respiratory panel PCR:  Thank you for allowing pharmacy to be a part of this patient's care.  Gretta Arab PharmD, BCPS Pager [phone] 09/12/2018 6:36 PM

## 2018-09-12 NOTE — ED Notes (Signed)
CRITICAL VALUE STICKER  CRITICAL VALUE: Lactic Acid = 2.7  RECEIVER (on-site recipient of call): M. Alicha Raspberry,RN  DATE & TIME NOTIFIED: 09/12/2018 1540  MESSENGER (representative from lab): Lab  MD NOTIFIED: A. Curatolo,MD  TIME OF NOTIFICATION: 09/12/2018 1540  RESPONSE: See new orders

## 2018-09-12 NOTE — ED Triage Notes (Addendum)
Patient BIB family, reports patient has had generalized weakness with vomiting x1 week. Hx kidney cancer. Last chemo x2 weeks ago. Family reports patient is altered. Also reports decreased appetite.

## 2018-09-12 NOTE — ED Notes (Signed)
ED TO INPATIENT HANDOFF REPORT  Name/Age/Gender Stephen Grimes 69 y.o. male  Code Status    Code Status Orders  (From admission, onward)         Start     Ordered   09/12/18 2225  Full code  Continuous     09/12/18 2224        Code Status History    This patient has a current code status but no historical code status.    Advance Directive Documentation     Most Recent Value  Type of Advance Directive  Healthcare Power of Attorney, Living will  Pre-existing out of facility DNR order (yellow form or pink MOST form)  -  "MOST" Form in Place?  -      Home/SNF/Other Home  Chief Complaint ca pt/flu like symptoms  Level of Care/Admitting Diagnosis ED Disposition    ED Disposition Condition [place]: [place] [100102]  Level of Care: Stepdown [14]  Admit to SDU based on following criteria: Severe physiological/psychological symptoms:  Any diagnosis requiring assessment & intervention at least every 4 hours on an ongoing basis to obtain desired patient outcomes including stability and rehabilitation  Diagnosis: Hyponatremia [761950]  Admitting Physician: Bonnielee Haff [[id number]]  Attending Physician: Bonnielee Haff [[id number]]  Estimated length of stay: past midnight tomorrow  Certification:: I certify this patient will need inpatient services for at least 2 midnights  PT Class (Do Not Modify): Inpatient [101]  PT Acc Code (Do Not Modify): Private [1]       Medical History Past Medical History:  Diagnosis Date  . Cancer [facility])    kidney cancer  . Cirrhosis ([place])   . COPD (chronic obstructive pulmonary disease) ([place])   . Coronary artery disease   . Hypertension   . Renal disorder   . Sleep apnea   . Thrombocytopathia ([place])   . Thrombocytopenia ([place]) 12/11/2011  . Thyroid disease     Allergies No Known Allergies  IV Location/Drains/Wounds Patient Lines/Drains/Airways Status   Active Line/Drains/Airways    Name:    Placement date:   Placement time:   Site:   Days:   Peripheral IV 09/12/18 Left;Upper Arm   09/12/18    1501    Arm   less than 1   Wound / Incision (Open or Dehisced) 07/20/14 Incision - Open Abdomen Right JP drain insertion site.    07/20/14    1410    Abdomen   1515   Wound / Incision (Open or Dehisced) 07/22/14 Incision - Open Abdomen Right INCISIONAL/MULIPLE SITES #1 RT UPPER SUTURES INTACT #2 OPEN DRAINING SEROUS SANG FLUID #3 LOWER RT GROIN  DIME SIZEHEALING   07/22/14    1908    Abdomen   1513          Labs/Imaging Results for orders placed or performed during the hospital encounter of 09/12/18 (from the past 48 hour(s))  Comprehensive metabolic panel     Status: Abnormal   Collection Time: 09/12/18  3:00 PM  Result Value Ref Range   Sodium 118 (LL) 135 - 145 mmol/L    Comment: CRITICAL RESULT CALLED TO, READ BACK BY AND VERIFIED WITH: EVANS,M. RN @1605  ON 02.21.2020 BY COHEN,K    Potassium 5.0 3.5 - 5.1 mmol/L   Chloride 90 (L) 98 - 111 mmol/L   CO2 11 (L) 22 - 32 mmol/L   Glucose, Bld 96 70 - 99 mg/dL   BUN 71 (H) 8 - 23  mg/dL   Creatinine, Ser 3.36 (H) 0.61 - 1.24 mg/dL   Calcium 8.8 (L) 8.9 - 10.3 mg/dL   Total Protein 8.2 (H) 6.5 - 8.1 g/dL   Albumin 2.9 (L) 3.5 - 5.0 g/dL   AST 94 (H) 15 - 41 U/L   ALT 37 0 - 44 U/L   Alkaline Phosphatase 169 (H) 38 - 126 U/L   Total Bilirubin 1.2 0.3 - 1.2 mg/dL   GFR calc non Af Amer 18 (L) >60 mL/min   GFR calc Af Amer 20 (L) >60 mL/min   Anion gap 17 (H) 5 - 15    Comment: Performed at [facility], [place] [address] [place], [place] [address]  Lactic acid, plasma     Status: Abnormal   Collection Time: 09/12/18  3:00 PM  Result Value Ref Range   Lactic Acid, Venous 2.7 (HH) 0.5 - 1.9 mmol/L    Comment: CRITICAL RESULT CALLED TO, READ BACK BY AND VERIFIED WITH: EVANS,M. RN @1539  ON 02.21.2020 BY COHEN,K Performed at [facility], [place] [address], [place], [place] [address]   CBC with  Differential     Status: Abnormal   Collection Time: 09/12/18  3:00 PM  Result Value Ref Range   WBC 16.2 (H) 4.0 - 10.5 K/uL   RBC 4.92 4.22 - 5.81 MIL/uL   Hemoglobin 15.3 13.0 - 17.0 g/dL   HCT 44.7 39.0 - 52.0 %   MCV 90.9 80.0 - 100.0 fL   MCH 31.1 26.0 - 34.0 pg   MCHC 34.2 30.0 - 36.0 g/dL   RDW 14.6 11.5 - 15.5 %   Platelets 170 150 - 400 K/uL   nRBC 0.0 0.0 - 0.2 %   Neutrophils Relative % 83 %   Neutro Abs 13.4 (H) 1.7 - 7.7 K/uL   Lymphocytes Relative 5 %   Lymphs Abs 0.8 0.7 - 4.0 K/uL   Monocytes Relative 6 %   Monocytes Absolute 1.0 0.1 - 1.0 K/uL   Eosinophils Relative 0 %   Eosinophils Absolute 0.0 0.0 - 0.5 K/uL   Basophils Relative 0 %   Basophils Absolute 0.1 0.0 - 0.1 K/uL   WBC Morphology MILD LEFT SHIFT (1-5% METAS, OCC MYELO, OCC BANDS)    Immature Granulocytes 6 %   Abs Immature Granulocytes 0.96 (H) 0.00 - 0.07 K/uL    Comment: Performed at [facility], [place] [address] [place], [place] [address]  Protime-INR     Status: None   Collection Time: 09/12/18  3:00 PM  Result Value Ref Range   Prothrombin Time 14.6 11.4 - 15.2 seconds   INR 1.15     Comment: Performed at [facility], [place] [address] [place], [place] [address]  Ammonia     Status: None   Collection Time: 09/12/18  3:00 PM  Result Value Ref Range   Ammonia 22 9 - 35 umol/L    Comment: Performed at [facility], [place] [address] [place], [place] [address]  Lipase, blood     Status: Abnormal   Collection Time: 09/12/18  3:00 PM  Result Value Ref Range   Lipase 64 (H) 11 - 51 U/L    Comment: Performed at [facility], [place] [address] [place], [place] [address]  Lactic acid, plasma     Status: Abnormal   Collection Time: 09/12/18  4:22 PM  Result Value Ref Range   Lactic Acid, Venous 2.7 (HH) 0.5 - 1.9  mmol/L    Comment: CRITICAL RESULT CALLED TO, READ BACK BY AND VERIFIED WITH: EVANS,M. RN @1656  ON 02.21.2020 BY  COHEN,K Performed at [facility], [place] [address], [place], [place] [address]   Influenza panel by PCR (type A & B)     Status: None   Collection Time: 09/12/18  4:22 PM  Result Value Ref Range   Influenza A By PCR NEGATIVE NEGATIVE   Influenza B By PCR NEGATIVE NEGATIVE    Comment: (NOTE) The Xpert Xpress Flu assay is intended as an aid in the diagnosis of  influenza and should not be used as a sole basis for treatment.  This  assay is FDA approved for nasopharyngeal swab specimens only. Nasal  washings and aspirates are unacceptable for Xpert Xpress Flu testing. Performed at [facility], [place] [address] [place], [place] [address]   Urinalysis, Routine w reflex microscopic     Status: Abnormal   Collection Time: 09/12/18  6:28 PM  Result Value Ref Range   Color, Urine YELLOW YELLOW   APPearance CLEAR CLEAR   Specific Gravity, Urine 1.016 1.005 - 1.030   pH 6.0 5.0 - 8.0   Glucose, UA NEGATIVE NEGATIVE mg/dL   Hgb urine dipstick SMALL (A) NEGATIVE   Bilirubin Urine NEGATIVE NEGATIVE   Ketones, ur NEGATIVE NEGATIVE mg/dL   Protein, ur 30 (A) NEGATIVE mg/dL   Nitrite NEGATIVE NEGATIVE   Leukocytes,Ua NEGATIVE NEGATIVE   WBC, UA 0-5 0 - 5 WBC/hpf   Bacteria, UA NONE SEEN NONE SEEN   Squamous Epithelial / LPF 0-5 0 - 5   Mucus PRESENT    Hyaline Casts, UA PRESENT     Comment: Performed at [facility], [place] [address], [place], [place] [address]  Lactic acid, plasma     Status: Abnormal   Collection Time: 09/12/18  8:00 PM  Result Value Ref Range   Lactic Acid, Venous 2.2 (HH) 0.5 - 1.9 mmol/L    Comment: CRITICAL RESULT CALLED TO, READ BACK BY AND VERIFIED WITH: DOSTER,T RN @2054  ON 09/12/2018 JACKSON,K Performed at [facility], [place] [address] [place], [place] [address]   Procalcitonin     Status: None   Collection Time: 09/12/18  8:00 PM  Result Value Ref Range   Procalcitonin 34.67 ng/mL     Comment:        Interpretation: PCT >= 10 ng/mL: Important systemic inflammatory response, almost exclusively due to severe bacterial sepsis or septic shock. (NOTE)       Sepsis PCT Algorithm           Lower Respiratory Tract                                      Infection PCT Algorithm    ----------------------------     ----------------------------         PCT < 0.25 ng/mL                PCT < 0.10 ng/mL         Strongly encourage             Strongly discourage   discontinuation of antibiotics    initiation of antibiotics    ----------------------------     -----------------------------       PCT 0.25 - 0.50 ng/mL            PCT  0.10 - 0.25 ng/mL               OR       >80% decrease in PCT            Discourage initiation of                                            antibiotics      Encourage discontinuation           of antibiotics    ----------------------------     -----------------------------         PCT >= 0.50 ng/mL              PCT 0.26 - 0.50 ng/mL                AND       <80% decrease in PCT             Encourage initiation of                                             antibiotics       Encourage continuation           of antibiotics    ----------------------------     -----------------------------        PCT >= 0.50 ng/mL                  PCT > 0.50 ng/mL               AND         increase in PCT                  Strongly encourage                                      initiation of antibiotics    Strongly encourage escalation           of antibiotics                                     -----------------------------                                           PCT <= 0.25 ng/mL                                                 OR                                        > 80% decrease in PCT  Discontinue / Do not initiate                                             antibiotics Performed at [place]  [address] [place], [place] [address]   Basic metabolic panel     Status: Abnormal   Collection Time: 09/12/18  8:00 PM  Result Value Ref Range   Sodium 119 (LL) 135 - 145 mmol/L    Comment: CRITICAL RESULT CALLED TO, READ BACK BY AND VERIFIED WITH: DOSTER, T RN @2053  AT 09/12/2018 JACKSON,K    Potassium 4.9 3.5 - 5.1 mmol/L   Chloride 98 98 - 111 mmol/L   CO2 11 (L) 22 - 32 mmol/L   Glucose, Bld 94 70 - 99 mg/dL   BUN 61 (H) 8 - 23 mg/dL   Creatinine, Ser 2.70 (H) 0.61 - 1.24 mg/dL   Calcium 7.3 (L) 8.9 - 10.3 mg/dL   GFR calc non Af Amer 23 (L) >60 mL/min   GFR calc Af Amer 27 (L) >60 mL/min   Anion gap 10 5 - 15    Comment: Performed at [facility], [place] [address] [place], [place] [address]  TSH     Status: Abnormal   Collection Time: 09/12/18  8:00 PM  Result Value Ref Range   TSH 16.088 (H) 0.350 - 4.500 uIU/mL    Comment: Performed by a 3rd Generation assay with a functional sensitivity of <=0.01 uIU/mL. Performed at [facility], [place] [address], [place], [place] [address]   MRSA PCR Screening     Status: None   Collection Time: 09/12/18  8:15 PM  Result Value Ref Range   MRSA by PCR NEGATIVE NEGATIVE    Comment:        The GeneXpert MRSA Assay (FDA approved for NASAL specimens only), is one component of a comprehensive MRSA colonization surveillance program. It is not intended to diagnose MRSA infection nor to guide or monitor treatment for MRSA infections. Performed at [facility], [place] [address], [place], [place] [address]    Dg Chest 2 View  Result Date: 09/12/2018 CLINICAL DATA:  Generalized weakness and vomiting. Patient is being treated with chemotherapy for renal cancer. EXAM: CHEST - 2 VIEW COMPARISON:  None. FINDINGS: Cardiomediastinal silhouette is normal. Mediastinal contours appear intact. There is no evidence of pleural effusion or pneumothorax. Dense airspace consolidation in the posterior  segment of the left upper lobe Osseous structures are without acute abnormality. Soft tissues are grossly normal. IMPRESSION: Dense airspace consolidation in the posterior segment of the left upper lobe. In the acute clinical settings this likely represents pneumonia, however please follow-up to resolution after empiric treatment to exclude underlying pulmonary mass. Electronically Signed   By: Fidela Salisbury M.D.   On: 09/12/2018 15:55    Pending Labs Unresulted Labs (From admission, onward)    Start     Ordered   09/13/18 0500  HIV antibody (Routine Testing)  Tomorrow morning,   R     09/12/18 2224   09/13/18 7494  Basic metabolic panel  Tomorrow morning,   R     09/12/18 2224   09/13/18 0500  CBC  Tomorrow morning,   R     09/12/18 2224   09/12/18 1827  Respiratory Panel by PCR  (Respiratory virus panel with precautions)  Once,  R     09/12/18 1826   09/12/18 1453  Urine culture  ONCE - STAT,   STAT     09/12/18 1452   09/12/18 1422  Culture, blood (Routine x 2)  BLOOD CULTURE X 2,   STAT     09/12/18 1421          Vitals/Pain Today's Vitals   09/12/18 2225 09/12/18 2229 09/12/18 2233 09/12/18 2235  BP:      Pulse:      Resp: 17 18 18  (!) 21  Temp:      TempSrc:      SpO2:      Weight:      Height:        Isolation Precautions No active isolations  Medications Medications  levothyroxine (SYNTHROID, LEVOTHROID) tablet 112 mcg (has no administration in time range)  enoxaparin (LOVENOX) injection 40 mg (has no administration in time range)  0.9 %  sodium chloride infusion ( Intravenous New Bag/Given 09/12/18 1931)  acetaminophen (TYLENOL) tablet 650 mg (has no administration in time range)    Or  acetaminophen (TYLENOL) suppository 650 mg (has no administration in time range)  ondansetron (ZOFRAN) tablet 4 mg (has no administration in time range)    Or  ondansetron (ZOFRAN) injection 4 mg (has no administration in time range)  ceFEPIme (MAXIPIME) 1 g in sodium  chloride 0.9 % 100 mL IVPB (has no administration in time range)  vancomycin (VANCOCIN) IVPB 750 mg/150 ml premix (has no administration in time range)  sodium chloride flush (NS) 0.9 % injection 3 mL (3 mLs Intravenous Given 09/12/18 1623)  sodium chloride 0.9 % bolus 1,000 mL (1,000 mLs Intravenous Bolus 09/12/18 1501)  ceFEPIme (MAXIPIME) 2 g in sodium chloride 0.9 % 100 mL IVPB (0 g Intravenous Stopped 09/12/18 1657)  acetaminophen (TYLENOL) tablet 1,000 mg (1,000 mg Oral Given 09/12/18 1616)  vancomycin (VANCOCIN) 1,250 mg in sodium chloride 0.9 % 250 mL IVPB (0 mg Intravenous Stopped 09/12/18 1835)  sodium chloride 0.9 % bolus 1,000 mL (1,000 mLs Intravenous Bolus 09/12/18 1835)    Mobility walks with device

## 2018-09-13 LAB — BASIC METABOLIC PANEL
Anion gap: 10 (ref 5–15)
Anion gap: 11 (ref 5–15)
Anion gap: 10 (ref 5–15)
Anion gap: 12 (ref 5–15)
Anion gap: 13 (ref 5–15)
BUN: 59 mg/dL — ABNORMAL HIGH (ref 8–23)
BUN: 58 mg/dL — ABNORMAL HIGH (ref 8–23)
BUN: 55 mg/dL — ABNORMAL HIGH (ref 8–23)
BUN: 53 mg/dL — ABNORMAL HIGH (ref 8–23)
BUN: 50 mg/dL — ABNORMAL HIGH (ref 8–23)
CALCIUM: 7.8 mg/dL — AB (ref 8.9–10.3)
CALCIUM: 7.9 mg/dL — AB (ref 8.9–10.3)
CO2: 11 mmol/L — AB (ref 22–32)
CO2: 10 mmol/L — ABNORMAL LOW (ref 22–32)
CO2: 12 mmol/L — ABNORMAL LOW (ref 22–32)
CO2: 11 mmol/L — ABNORMAL LOW (ref 22–32)
CO2: 10 mmol/L — ABNORMAL LOW (ref 22–32)
CREATININE: 1.89 mg/dL — AB (ref 0.61–1.24)
Calcium: 7.8 mg/dL — ABNORMAL LOW (ref 8.9–10.3)
Calcium: 8 mg/dL — ABNORMAL LOW (ref 8.9–10.3)
Calcium: 7.7 mg/dL — ABNORMAL LOW (ref 8.9–10.3)
Chloride: 104 mmol/L (ref 98–111)
Chloride: 105 mmol/L (ref 98–111)
Chloride: 105 mmol/L (ref 98–111)
Chloride: 104 mmol/L (ref 98–111)
Chloride: 98 mmol/L (ref 98–111)
Creatinine, Ser: 2.27 mg/dL — ABNORMAL HIGH (ref 0.61–1.24)
Creatinine, Ser: 2.13 mg/dL — ABNORMAL HIGH (ref 0.61–1.24)
Creatinine, Ser: 2.06 mg/dL — ABNORMAL HIGH (ref 0.61–1.24)
Creatinine, Ser: 1.91 mg/dL — ABNORMAL HIGH (ref 0.61–1.24)
GFR calc Af Amer: 33 mL/min — ABNORMAL LOW (ref >60)
GFR calc Af Amer: 36 mL/min — ABNORMAL LOW (ref >60)
GFR calc Af Amer: 37 mL/min — ABNORMAL LOW (ref >60)
GFR calc Af Amer: 41 mL/min — ABNORMAL LOW (ref >60)
GFR calc Af Amer: 41 mL/min — ABNORMAL LOW (ref >60)
GFR calc non Af Amer: 28 mL/min — ABNORMAL LOW (ref >60)
GFR calc non Af Amer: 35 mL/min — ABNORMAL LOW (ref >60)
GFR calc non Af Amer: 35 mL/min — ABNORMAL LOW (ref >60)
GFR, EST NON AFRICAN AMERICAN: 31 mL/min — AB (ref >60)
GFR, EST NON AFRICAN AMERICAN: 32 mL/min — AB (ref >60)
Glucose, Bld: 95 mg/dL (ref 70–99)
Glucose, Bld: 95 mg/dL (ref 70–99)
Glucose, Bld: 93 mg/dL (ref 70–99)
Glucose, Bld: 95 mg/dL (ref 70–99)
Glucose, Bld: 119 mg/dL — ABNORMAL HIGH (ref 70–99)
Potassium: 5.1 mmol/L (ref 3.5–5.1)
Potassium: 4.5 mmol/L (ref 3.5–5.1)
Potassium: 4.3 mmol/L (ref 3.5–5.1)
Potassium: 4.5 mmol/L (ref 3.5–5.1)
Potassium: 4.9 mmol/L (ref 3.5–5.1)
SODIUM: 127 mmol/L — AB (ref 135–145)
SODIUM: 127 mmol/L — AB (ref 135–145)
SODIUM: 121 mmol/L — AB (ref 135–145)
Sodium: 125 mmol/L — ABNORMAL LOW (ref 135–145)
Sodium: 126 mmol/L — ABNORMAL LOW (ref 135–145)

## 2018-09-13 LAB — RESPIRATORY PANEL BY PCR
Adenovirus: NOT DETECTED
Bordetella pertussis: NOT DETECTED
CORONAVIRUS 229E-RVPPCR: NOT DETECTED
Chlamydophila pneumoniae: NOT DETECTED
Coronavirus HKU1: NOT DETECTED
Coronavirus NL63: NOT DETECTED
Coronavirus OC43: NOT DETECTED
Influenza A: NOT DETECTED
Influenza B: NOT DETECTED
METAPNEUMOVIRUS-RVPPCR: NOT DETECTED
Mycoplasma pneumoniae: NOT DETECTED
PARAINFLUENZA VIRUS 2-RVPPCR: NOT DETECTED
Parainfluenza Virus 1: NOT DETECTED
Parainfluenza Virus 3: NOT DETECTED
Parainfluenza Virus 4: NOT DETECTED
Respiratory Syncytial Virus: NOT DETECTED
Rhinovirus / Enterovirus: NOT DETECTED

## 2018-09-13 LAB — CBC
HCT: 40.7 % (ref 39.0–52.0)
Hemoglobin: 13.6 g/dL (ref 13.0–17.0)
MCH: 30.7 pg (ref 26.0–34.0)
MCHC: 33.4 g/dL (ref 30.0–36.0)
MCV: 91.9 fL (ref 80.0–100.0)
Platelets: 120 10*3/uL — ABNORMAL LOW (ref 150–400)
RBC: 4.43 MIL/uL (ref 4.22–5.81)
RDW: 14.6 % (ref 11.5–15.5)
WBC: 10.8 10*3/uL — ABNORMAL HIGH (ref 4.0–10.5)
nRBC: 0 % (ref 0.0–0.2)

## 2018-09-13 LAB — HIV ANTIBODY (ROUTINE TESTING W REFLEX): HIV Screen 4th Generation wRfx: NONREACTIVE

## 2018-09-13 LAB — T4, FREE: Free T4: 0.56 ng/dL — ABNORMAL LOW (ref 0.82–1.77)

## 2018-09-13 MED ORDER — BACLOFEN 10 MG PO TABS
5.0000 mg | ORAL_TABLET | Freq: Once | ORAL | Status: AC
  Administered 2018-09-13: 5 mg via ORAL
  Filled 2018-09-13: qty 1

## 2018-09-13 MED ORDER — EVEROLIMUS 5 MG PO TABS: 5.0000 mg | ORAL_TABLET | Freq: Every day | ORAL | Status: DC

## 2018-09-13 MED ORDER — LENVATINIB (18 MG DAILY DOSE) 10 MG & 2 X 4 MG PO CPPK: 18.0000 mg | ORAL_CAPSULE | ORAL | Status: DC

## 2018-09-13 MED ORDER — CABOZANTINIB S-MALATE 20 MG PO TABS: 20.0000 mg | ORAL_TABLET | Freq: Every day | ORAL | Status: DC

## 2018-09-13 MED ORDER — CHOLESTYRAMINE LIGHT 4 G PO PACK
4.0000 g | PACK | Freq: Two times a day (BID) | ORAL | Status: DC
  Administered 2018-09-13 – 2018-09-17 (×9): 4 g via ORAL
  Filled 2018-09-13 (×11): qty 1

## 2018-09-13 MED ORDER — CHOLESTYRAMINE 4 G PO PACK
4.0000 g | PACK | Freq: Two times a day (BID) | ORAL | Status: DC
  Filled 2018-09-13: qty 1

## 2018-09-13 MED ORDER — AMLODIPINE BESYLATE 10 MG PO TABS
10.0000 mg | ORAL_TABLET | Freq: Every day | ORAL | Status: DC
  Administered 2018-09-13 – 2018-09-18 (×6): 10 mg via ORAL
  Filled 2018-09-13 (×6): qty 1

## 2018-09-13 NOTE — Evaluation (Signed)
Clinical/Bedside Swallow Evaluation Patient Details  Name: NIKOLAOS MADDOCKS MRN: [id number] Date of Birth: 1951/03/01  Today's Date: 09/13/2018 Time: SLP Start Time (ACUTE ONLY): 0850 SLP Stop Time (ACUTE ONLY): 0915 SLP Time Calculation (min) (ACUTE ONLY): 25 min  Past Medical History:  Past Medical History:  Diagnosis Date  . Cancer [facility])    kidney cancer  . Cirrhosis ([place])   . COPD (chronic obstructive pulmonary disease) ([place])   . Coronary artery disease   . Hypertension   . Renal disorder   . Sleep apnea   . Thrombocytopathia ([place])   . Thrombocytopenia ([place]) 12/11/2011  . Thyroid disease    Past Surgical History:  Past Surgical History:  Procedure Laterality Date  . kidney removed Right   . TESTICLE REMOVAL     HPI:  Patient is a 68 y.o. male with PMH: metastatic renal cell carcinoma, alcoholic liver cirrhosis, essential HTN, hypothyroidism, who was brought to ER by family members secondary to very poor oral intake, nausea and vomitting and SOB the past few days prior to admission. Patient was confused upon admission, exhibiting acute renal failure, hyponatremia and with chest x-ray concerning for left lung consolidation.   Assessment / Plan / Recommendation Clinical Impression  Patient presents with a mild oropharyngeal dysphagia without overt s/s of aspiration or penetration. Patient with mildly delayed mastication of had solid (graham cracker) which SLP suspects is due to patient's overall weakness/fatigue. Patient's voice remained clear throughout PO trials of thin liquids via straw sips, puree solids and regular solids. Patient did exhibit hiccuping prior to PO's, which he stated and wife agreed, had been going on for past 2-3 days. Patient was not oberved to hiccup when elevated at or over 30 degrees and did not exhibit any hiccuping during or immediately after PO intake. Based on patient report and SLP's observations of hiccuping, patient may be exhibing an esophageal  dysphagia, GERD symptoms. SLP Visit Diagnosis: Dysphagia, unspecified (R13.10)    Aspiration Risk  Mild aspiration risk    Diet Recommendation Dysphagia 3 (Mech soft);Thin liquid   Liquid Administration via: Straw;Cup Medication Administration: Whole meds with liquid Supervision: Patient able to self feed;Staff to assist with self feeding;Intermittent supervision to cue for compensatory strategies Compensations: Minimize environmental distractions;Slow rate;Small sips/bites Postural Changes: Seated upright at 90 degrees;Remain upright for at least 30 minutes after po intake    Other  Recommendations Recommended Consults: Consider GI evaluation Oral Care Recommendations: Oral care BID   Follow up Recommendations None      Frequency and Duration min 1 x/week  1 week       Prognosis Prognosis for Safe Diet Advancement: Good      Swallow Study   General Date of Onset: 09/12/18 HPI: Patient is a 68 y.o. male with PMH: metastatic renal cell carcinoma, alcoholic liver cirrhosis, essential HTN, hypothyroidism, who was brought to ER by family members secondary to very poor oral intake, nausea and vomitting and SOB the past few days prior to admission. Patient was confused upon admission, exhibiting acute renal failure, hyponatremia and with chest x-ray concerning for left lung consolidation. Type of Study: Bedside Swallow Evaluation Previous Swallow Assessment: N/A Diet Prior to this Study: NPO Temperature Spikes Noted: No Respiratory Status: Room air History of Recent Intubation: No Behavior/Cognition: Alert;Cooperative;Pleasant mood Oral Cavity Assessment: Within Functional Limits Oral Care Completed by SLP: Yes Oral Cavity - Dentition: Adequate natural dentition Vision: Functional for self-feeding Self-Feeding Abilities: Able to feed self;Needs set up Patient Positioning: Upright in bed  Baseline Vocal Quality: Low vocal intensity Volitional Cough: Weak Volitional Swallow:  Able to elicit    Oral/Motor/Sensory Function Overall Oral Motor/Sensory Function: Mild impairment Facial ROM: Within Functional Limits Facial Symmetry: Within Functional Limits Facial Strength: Within Functional Limits Lingual ROM: Reduced right;Reduced left;Other (Comment)(mild lingual tremor) Lingual Symmetry: Within Functional Limits Lingual Strength: Reduced Velum: Within Functional Limits Mandible: Within Functional Limits   Ice Chips Ice chips: Not tested   Thin Liquid Thin Liquid: Within functional limits Presentation: Self Fed;Straw Other Comments: audible swallow but no overt s/s of penetration or aspiration with voice remaining clear throughout.    Nectar Thick Nectar Thick Liquid: Not tested   Honey Thick Honey Thick Liquid: Not tested   Puree Puree: Within functional limits Presentation: Spoon   Solid     Solid: Impaired Oral Phase Impairments: Impaired mastication Other Comments: Mildly delayed mastication which SLP suspects is due to patient's overall weakness      Sonia Baller, MA, CCC-SLP 09/13/18 9:29 AM

## 2018-09-13 NOTE — Evaluation (Signed)
Physical Therapy Evaluation Patient Details Name: Stephen Grimes MRN: [id number] DOB: 10/08/1950 Today's Date: 09/13/2018   History of Present Illness  Stephen Grimes is a 68 y.o. male with a past medical history of metastatic renal cell carcinoma, alcoholic liver cirrhosis, essential hypertension, hypothyroidism admitted with severe hyponatremia, acute renal failure and HCAP.  Clinical Impression  Patient presents with decreased independence with mobility due to deficits listed in PT problem list.  He will benefit from skilled PT in the acute setting to allow return home with follow up HHPT and family support at d/c.     Follow Up Recommendations Home health PT;Supervision/Assistance - 24 hour    Equipment Recommendations  Rolling walker with 5" wheels    Recommendations for Other Services       Precautions / Restrictions Precautions Precautions: Fall      Mobility  Bed Mobility Overal bed mobility: Needs Assistance Bed Mobility: Supine to Sit     Supine to sit: Min assist;HOB elevated     General bed mobility comments: increased time  Transfers Overall transfer level: Needs assistance Equipment used: Rolling walker (2 wheeled) Transfers: Sit to/from Stand Sit to Stand: Min assist         General transfer comment: up from EOB assist for balance  Ambulation/Gait Ambulation/Gait assistance: Min assist Gait Distance (Feet): 20 Feet Assistive device: Rolling walker (2 wheeled) Gait Pattern/deviations: Step-to pattern;Step-through pattern;Decreased stride length     General Gait Details: significantly slowed speed especially backing up to chair in room   Stairs            Wheelchair Mobility    Modified Rankin (Stroke Patients Only)       Balance Overall balance assessment: Needs assistance Sitting-balance support: Feet unsupported Sitting balance-Leahy Scale: Fair       Standing balance-Leahy Scale: Poor Standing balance comment: UE support  for balance                             Pertinent Vitals/Pain Pain Assessment: No/denies pain    Home Living Family/patient expects to be discharged to:: Private residence Living Arrangements: Spouse/significant other Available Help at Discharge: Family Type of Home: House Home Access: Stairs to enter Entrance Stairs-Rails: Right Entrance Stairs-Number of Steps: 3 Home Layout: Two level;Able to live on main level with bedroom/bathroom Home Equipment: Cane - single point Additional Comments: walked with 2 canes just prior to admission after gradual decline over couple months    Prior Function Level of Independence: Independent with assistive device(s)               Hand Dominance        Extremity/Trunk Assessment        Lower Extremity Assessment Lower Extremity Assessment: Generalized weakness       Communication   Communication: No difficulties  Cognition Arousal/Alertness: Awake/alert Behavior During Therapy: WFL for tasks assessed/performed Overall Cognitive Status: Impaired/Different from baseline Area of Impairment: Orientation;Following commands                 Orientation Level: Disoriented to;Situation;Time     Following Commands: Follows one step commands consistently;Follows one step commands with increased time              General Comments General comments (skin integrity, edema, etc.): wife in room and answered questions as pt with AMS    Exercises     Assessment/Plan    PT Assessment Patient needs continued  PT services  PT Problem List Decreased strength;Decreased mobility;Decreased safety awareness;Decreased balance;Decreased knowledge of use of DME;Decreased cognition;Decreased knowledge of precautions       PT Treatment Interventions DME instruction;Functional mobility training;Balance training;Patient/family education;Gait training;Therapeutic activities;Therapeutic exercise;Stair training    PT Goals  (Current goals can be found in the Care Plan section)  Acute Rehab PT Goals Patient Stated Goal: to return to independent PT Goal Formulation: With patient/family Time For Goal Achievement: 09/20/18 Potential to Achieve Goals: Good    Frequency Min 3X/week   Barriers to discharge        Co-evaluation               AM-PAC PT "6 Clicks" Mobility  Outcome Measure Help needed turning from your back to your side while in a flat bed without using bedrails?: A Little Help needed moving from lying on your back to sitting on the side of a flat bed without using bedrails?: A Little Help needed moving to and from a bed to a chair (including a wheelchair)?: A Little Help needed standing up from a chair using your arms (e.g., wheelchair or bedside chair)?: A Little Help needed to walk in hospital room?: A Little Help needed climbing 3-5 steps with a railing? : A Lot 6 Click Score: 17    End of Session Equipment Utilized During Treatment: Gait belt Activity Tolerance: Patient tolerated treatment well Patient left: with call bell/phone within reach;in chair;with chair alarm set;with family/visitor present Nurse Communication: Mobility status PT Visit Diagnosis: Other abnormalities of gait and mobility (R26.89);Muscle weakness (generalized) (M62.81)    Time: 3419-6222 PT Time Calculation (min) (ACUTE ONLY): 29 min   Charges:   PT Evaluation $PT Eval Moderate Complexity: 1 Mod PT Treatments $Gait Training: 8-22 mins        Magda Kiel, [place] [facility] [phone] 09/13/2018   Reginia Naas 09/13/2018, 4:29 PM

## 2018-09-13 NOTE — Progress Notes (Signed)
PROGRESS NOTE    Stephen Grimes  CBJ:[id number] DOB: 06/13/1951 DOA: 09/12/2018 PCP: Stephens Shire, MD     Brief Narrative:  Stephen Grimes is a 69 y.o. male with a past medical history of metastatic renal cell carcinoma, alcoholic liver cirrhosis, essential hypertension, hypothyroidism who was brought in by family members as patient has had very poor oral intake the last few days.  He is usually an active person and has not been doing much in the last 24 hours.  He has had nausea and vomiting.  Has been noted to be short of breath.  He has had episodes of diarrhea.  He was noted to be confused. In the emergency department, patient revealed hyponatremia with a sodium level of 118.  He was found to have acute renal failure with BUN of 71 and creatinine of 3.36.  Noted to have mildly elevated lipase level at 64.  Ammonia was normal.  Lactic acid level was 2.7.  WBC 16.2.  Chest x-ray raised concern for left lung consolidation.  Patient was admitted for further treatment of healthcare associated pneumonia as well as hyponatremia.  New events last 24 hours / Subjective: No new issues overnight, wife at bedside.  He continues to have poor p.o. intake, but feels that he could advance his diet today.  Continues to be very weak.  Assessment & Plan:   Active Problems:   AKI (acute kidney injury) ([place])   Alcoholic cirrhosis of liver without ascites ([facility])   Hypothyroidism, unspecified   Malignant neoplasm of unspecified kidney, except renal pelvis ([place])   HCAP (healthcare-associated pneumonia)   Hyponatremia   Acute metabolic encephalopathy   Sepsis ([place])   Sepsis secondary to HCAP, left upper lobe Patient is immunosuppressed from chemotherapy for metastatic renal cell cancer.  He presented with fever 101.8, tachypnea, leukocytosis 16.2, procalcitonin 34.67 Influenza PCR negative, respiratory viral panel negative MRSA PCR negative, stop vancomycin Blood cultures pending Continue  cefepime  Hypovolemic hyponatremia Presented with sodium 118, improved to 126 over the last 17 hours, stop IVF to avoid rapid correction Continue to trend BMP q4h   Nausea, vomiting, diarrhea No further episodes, continue to monitor, supportive care  AKI on CKD stage III Baseline creatinine 2.3 Continue IV fluids, creatinine improving appropriately  Essential hypertension Continue Norvasc, holding Cozaar due to acute kidney injury  Acute metabolic encephalopathy Seems to have improved since admission  History of alcoholic cirrhosis  Hypothyroidism TSH elevated 16, check free T4 Continue Synthroid  Metastatic renal cell cancer Follows at [facility], resume his chemo medications once sepsis pathology improves   DVT prophylaxis: Lovenox Code Status: Full code Family Communication: Wife at bedside Disposition Plan: Pending further improvements in his pneumonia, hyponatremia, nausea vomiting diarrhea, acute kidney injury as well as his encephalopathy.  PT OT consulted   Consultants:   None  Procedures:   None  Antimicrobials:  Anti-infectives (From admission, onward)   Start     Dose/Rate Route Frequency Ordered Stop   09/14/18 1600  vancomycin (VANCOCIN) IVPB 750 mg/150 ml premix  Status:  Discontinued     750 mg 150 mL/hr over 60 Minutes Intravenous Every 48 hours 09/12/18 1839 09/13/18 0722   09/13/18 0600  ceFEPIme (MAXIPIME) 1 g in sodium chloride 0.9 % 100 mL IVPB     1 g 200 mL/hr over 30 Minutes Intravenous Every 12 hours 09/12/18 1839     09/12/18 1630  vancomycin (VANCOCIN) 1,250 mg in sodium chloride 0.9 % 250 mL IVPB  1,250 mg 166.7 mL/hr over 90 Minutes Intravenous STAT 09/12/18 1614 09/12/18 1835   09/12/18 1545  ceFEPIme (MAXIPIME) 2 g in sodium chloride 0.9 % 100 mL IVPB     2 g 200 mL/hr over 30 Minutes Intravenous STAT 09/12/18 1538 09/12/18 1657       Objective: Vitals:   09/13/18 0500 09/13/18 0740 09/13/18 0800 09/13/18 0900  BP: (!)  122/101  138/83 133/90  Pulse: (!) 56  (!) 56 (!) 54  Resp: 14  17 20   Temp:  (!) 97.4 F (36.3 C)    TempSrc:  Axillary    SpO2: 98%  96% 99%  Weight:      Height:        Intake/Output Summary (Last 24 hours) at 09/13/2018 1100 Last data filed at 09/13/2018 0600 Gross per 24 hour  Intake 883.85 ml  Output 1501 ml  Net -617.15 ml   Filed Weights   09/12/18 1558  Weight: 63 kg    Examination:  General exam: Appears calm and comfortable, fatigued Respiratory system: Clear to auscultation. Respiratory effort normal. Cardiovascular system: S1 & S2 heard, RRR. No JVD, murmurs, rubs, gallops or clicks. No pedal edema. Gastrointestinal system: Abdomen is nondistended, soft and nontender. No organomegaly or masses felt. Normal bowel sounds heard. Central nervous system: Alert and oriented. No focal neurological deficits. Extremities: Symmetric 5 x 5 power. Skin: No rashes, lesions or ulcers Psychiatry: Judgement and insight appear stable  Data Reviewed: I have personally reviewed following labs and imaging studies  CBC: Recent Labs  Lab 09/12/18 1500 09/13/18 0258  WBC 16.2* 10.8*  NEUTROABS 13.4*  --   HGB 15.3 13.6  HCT 44.7 40.7  MCV 90.9 91.9  PLT 170 532*   Basic Metabolic Panel: Recent Labs  Lab 09/12/18 1500 09/12/18 2000 09/13/18 0258 09/13/18 0805  NA 118* 119* 125* 126*  K 5.0 4.9 5.1 4.5  CL 90* 98 104 105  CO2 11* 11* 11* 10*  GLUCOSE 96 94 95 95  BUN 71* 61* 59* 58*  CREATININE 3.36* 2.70* 2.27* 2.13*  CALCIUM 8.8* 7.3* 7.8* 7.8*   GFR: Estimated Creatinine Clearance: 29.2 mL/min (A) (by C-G formula based on SCr of 2.13 mg/dL (H)). Liver Function Tests: Recent Labs  Lab 09/12/18 1500  AST 94*  ALT 37  ALKPHOS 169*  BILITOT 1.2  PROT 8.2*  ALBUMIN 2.9*   Recent Labs  Lab 09/12/18 1500  LIPASE 64*   Recent Labs  Lab 09/12/18 1500  AMMONIA 22   Coagulation Profile: Recent Labs  Lab 09/12/18 1500  INR 1.15   Cardiac  Enzymes: No results for input(s): CKTOTAL, CKMB, CKMBINDEX, TROPONINI in the last 168 hours. BNP (last 3 results) No results for input(s): PROBNP in the last 8760 hours. HbA1C: No results for input(s): HGBA1C in the last 72 hours. CBG: No results for input(s): GLUCAP in the last 168 hours. Lipid Profile: No results for input(s): CHOL, HDL, LDLCALC, TRIG, CHOLHDL, LDLDIRECT in the last 72 hours. Thyroid Function Tests: Recent Labs    09/12/18 2000  TSH 16.088*   Anemia Panel: No results for input(s): VITAMINB12, FOLATE, FERRITIN, TIBC, IRON, RETICCTPCT in the last 72 hours. Sepsis Labs: Recent Labs  Lab 09/12/18 1500 09/12/18 1622 09/12/18 2000  PROCALCITON  --   --  34.67  LATICACIDVEN 2.7* 2.7* 2.2*    Recent Results (from the past 240 hour(s))  Respiratory Panel by PCR     Status: None   Collection Time: 09/12/18  8:15  PM  Result Value Ref Range Status   Adenovirus NOT DETECTED NOT DETECTED Final   Coronavirus 229E NOT DETECTED NOT DETECTED Final    Comment: (NOTE) The Coronavirus on the Respiratory Panel, DOES NOT test for the novel  Coronavirus (2019 nCoV)    Coronavirus HKU1 NOT DETECTED NOT DETECTED Final   Coronavirus NL63 NOT DETECTED NOT DETECTED Final   Coronavirus OC43 NOT DETECTED NOT DETECTED Final   Metapneumovirus NOT DETECTED NOT DETECTED Final   Rhinovirus / Enterovirus NOT DETECTED NOT DETECTED Final   Influenza A NOT DETECTED NOT DETECTED Final   Influenza B NOT DETECTED NOT DETECTED Final   Parainfluenza Virus 1 NOT DETECTED NOT DETECTED Final   Parainfluenza Virus 2 NOT DETECTED NOT DETECTED Final   Parainfluenza Virus 3 NOT DETECTED NOT DETECTED Final   Parainfluenza Virus 4 NOT DETECTED NOT DETECTED Final   Respiratory Syncytial Virus NOT DETECTED NOT DETECTED Final   Bordetella pertussis NOT DETECTED NOT DETECTED Final   Chlamydophila pneumoniae NOT DETECTED NOT DETECTED Final   Mycoplasma pneumoniae NOT DETECTED NOT DETECTED Final     Comment: Performed at [facility], [place] [address] [place], [place] [address]  MRSA PCR Screening     Status: None   Collection Time: 09/12/18  8:15 PM  Result Value Ref Range Status   MRSA by PCR NEGATIVE NEGATIVE Final    Comment:        The GeneXpert MRSA Assay (FDA approved for NASAL specimens only), is one component of a comprehensive MRSA colonization surveillance program. It is not intended to diagnose MRSA infection nor to guide or monitor treatment for MRSA infections. Performed at [facility], [place] [address] [place],  [address]        Radiology Studies: Dg Chest 2 View  Result Date: 09/12/2018 CLINICAL DATA:  Generalized weakness and vomiting. Patient is being treated with chemotherapy for renal cancer. EXAM: CHEST - 2 VIEW COMPARISON:  None. FINDINGS: Cardiomediastinal silhouette is normal. Mediastinal contours appear intact. There is no evidence of pleural effusion or pneumothorax. Dense airspace consolidation in the posterior segment of the left upper lobe Osseous structures are without acute abnormality. Soft tissues are grossly normal. IMPRESSION: Dense airspace consolidation in the posterior segment of the left upper lobe. In the acute clinical settings this likely represents pneumonia, however please follow-up to resolution after empiric treatment to exclude underlying pulmonary mass. Electronically Signed   By: Fidela Salisbury M.D.   On: 09/12/2018 15:55      Scheduled Meds: . amLODipine  10 mg Oral Daily  . cholestyramine  4 g Oral BID  . enoxaparin (LOVENOX) injection  40 mg Subcutaneous Daily  . levothyroxine  112 mcg Oral Daily   Continuous Infusions: . ceFEPime (MAXIPIME) IV Stopped (09/13/18 0544)     LOS: 1 day    Time spent: 40 minutes   Dessa Phi, DO [facility] [email or web address] 09/13/2018, 11:00 AM

## 2018-09-14 ENCOUNTER — Encounter (HOSPITAL_COMMUNITY): Payer: Self-pay

## 2018-09-14 ENCOUNTER — Inpatient Hospital Stay (HOSPITAL_COMMUNITY): Payer: Medicare Other

## 2018-09-14 LAB — BASIC METABOLIC PANEL
ANION GAP: 11 (ref 5–15)
Anion gap: 12 (ref 5–15)
BUN: 49 mg/dL — ABNORMAL HIGH (ref 8–23)
BUN: 47 mg/dL — ABNORMAL HIGH (ref 8–23)
CHLORIDE: 104 mmol/L (ref 98–111)
CO2: 11 mmol/L — ABNORMAL LOW (ref 22–32)
CO2: 9 mmol/L — AB (ref 22–32)
Calcium: 8 mg/dL — ABNORMAL LOW (ref 8.9–10.3)
Calcium: 7.9 mg/dL — ABNORMAL LOW (ref 8.9–10.3)
Chloride: 102 mmol/L (ref 98–111)
Creatinine, Ser: 1.69 mg/dL — ABNORMAL HIGH (ref 0.61–1.24)
Creatinine, Ser: 1.6 mg/dL — ABNORMAL HIGH (ref 0.61–1.24)
GFR calc Af Amer: 47 mL/min — ABNORMAL LOW (ref >60)
GFR calc Af Amer: 50 mL/min — ABNORMAL LOW (ref >60)
GFR calc non Af Amer: 41 mL/min — ABNORMAL LOW (ref >60)
GFR calc non Af Amer: 43 mL/min — ABNORMAL LOW (ref >60)
Glucose, Bld: 104 mg/dL — ABNORMAL HIGH (ref 70–99)
Glucose, Bld: 108 mg/dL — ABNORMAL HIGH (ref 70–99)
Potassium: 4.7 mmol/L (ref 3.5–5.1)
Potassium: 4.2 mmol/L (ref 3.5–5.1)
Sodium: 125 mmol/L — ABNORMAL LOW (ref 135–145)
Sodium: 124 mmol/L — ABNORMAL LOW (ref 135–145)

## 2018-09-14 LAB — CBC
HCT: 41.2 % (ref 39.0–52.0)
Hemoglobin: 14.1 g/dL (ref 13.0–17.0)
MCH: 31.1 pg (ref 26.0–34.0)
MCHC: 34.2 g/dL (ref 30.0–36.0)
MCV: 90.9 fL (ref 80.0–100.0)
Platelets: 141 10*3/uL — ABNORMAL LOW (ref 150–400)
RBC: 4.53 MIL/uL (ref 4.22–5.81)
RDW: 14.9 % (ref 11.5–15.5)
WBC: 14.7 10*3/uL — ABNORMAL HIGH (ref 4.0–10.5)
nRBC: 0 % (ref 0.0–0.2)

## 2018-09-14 LAB — URINE CULTURE: CULTURE: NO GROWTH

## 2018-09-14 MED ORDER — SODIUM CHLORIDE 0.9 % IV SOLN
INTRAVENOUS | Status: DC
  Administered 2018-09-14 – 2018-09-15 (×3): via INTRAVENOUS

## 2018-09-14 MED ORDER — CHLORPROMAZINE HCL 25 MG PO TABS
25.0000 mg | ORAL_TABLET | Freq: Three times a day (TID) | ORAL | Status: DC | PRN
  Administered 2018-09-14: 25 mg via ORAL
  Filled 2018-09-14 (×2): qty 1

## 2018-09-14 MED ORDER — LEVOTHYROXINE SODIUM 150 MCG PO TABS
150.0000 ug | ORAL_TABLET | Freq: Every day | ORAL | Status: DC
  Administered 2018-09-15 – 2018-09-18 (×4): 150 ug via ORAL
  Filled 2018-09-14: qty 1
  Filled 2018-09-14: qty 2
  Filled 2018-09-14 (×2): qty 1
  Filled 2018-09-14: qty 2

## 2018-09-14 NOTE — Progress Notes (Addendum)
PROGRESS NOTE    Stephen Grimes  KNL:[id number] DOB: Apr 11, 1951 DOA: 09/12/2018 PCP: Stephens Shire, MD     Brief Narrative:  Stephen Grimes is a 68 y.o. male with a past medical history of metastatic renal cell carcinoma, alcoholic liver cirrhosis, essential hypertension, hypothyroidism who was brought in by family members as patient has had very poor oral intake the last few days.  He is usually an active person and has not been doing much in the last 24 hours.  He has had nausea and vomiting.  Has been noted to be short of breath.  He has had episodes of diarrhea.  He was noted to be confused. In the emergency department, patient revealed hyponatremia with a sodium level of 118.  He was found to have acute renal failure with BUN of 71 and creatinine of 3.36.  Noted to have mildly elevated lipase level at 64.  Ammonia was normal.  Lactic acid level was 2.7.  WBC 16.2.  Chest x-ray raised concern for left lung consolidation.  Patient was admitted for further treatment of healthcare associated pneumonia as well as hyponatremia.  New events last 24 hours / Subjective: Wife at bedside. Patient had continued vomiting last evening after dinner, also 3x loose green stools. No fevers, denies abdominal pain.   Assessment & Plan:   Principal Problem:   HCAP (healthcare-associated pneumonia) Active Problems:   AKI (acute kidney injury) ([place])   Alcoholic cirrhosis of liver without ascites (HCC)   Hypothyroidism, unspecified   Malignant neoplasm of unspecified kidney, except renal pelvis (HCC)   Hyponatremia   Acute metabolic encephalopathy   Sepsis ([place])   Sepsis secondary to HCAP, left upper lobe Patient is immunosuppressed from chemotherapy for metastatic renal cell cancer.  He presented with fever 101.8, tachypnea, leukocytosis 16.2, procalcitonin 34.67 Influenza PCR negative, respiratory viral panel negative MRSA PCR negative, stop vancomycin Blood cultures negative to date  Continue  cefepime  Hypovolemic hyponatremia Presented with sodium 118, improved to 126. IVF stopped to prevent rapid correction Now sodium dropped to 124 overnight, resume IVF  Continue to trend BMP   Nausea, vomiting, diarrhea Check stool PCR   AKI on CKD stage III Baseline creatinine 2.3 Continue IV fluids, creatinine improving appropriately  Essential hypertension Continue Norvasc, holding Cozaar due to acute kidney injury  Acute metabolic encephalopathy At baseline, patient runs a business, very talkative. Currently, he is lethargic although not really confused. However, wife states that this is not his baseline and he has not improved at all with our medical tx here. Will check head CT  History of alcoholic cirrhosis  Hypothyroidism TSH elevated 16, free T4 low Continue Synthroid, dose increased starting 2/24 AM   Metastatic renal cell cancer Follows at [facility], resume his chemo medications once sepsis pathology improves   DVT prophylaxis: Lovenox Code Status: Full code Family Communication: Wife at bedside Disposition Plan: Pending further improvements in his pneumonia, hyponatremia, nausea vomiting diarrhea, acute kidney injury.  PT OT recommending home health when able to discharge.    Consultants:   None  Procedures:   None  Antimicrobials:  Anti-infectives (From admission, onward)   Start     Dose/Rate Route Frequency Ordered Stop   09/14/18 1600  vancomycin (VANCOCIN) IVPB 750 mg/150 ml premix  Status:  Discontinued     750 mg 150 mL/hr over 60 Minutes Intravenous Every 48 hours 09/12/18 1839 09/13/18 0722   09/13/18 0600  ceFEPIme (MAXIPIME) 1 g in sodium chloride 0.9 % 100  mL IVPB     1 g 200 mL/hr over 30 Minutes Intravenous Every 12 hours 09/12/18 1839     09/12/18 1630  vancomycin (VANCOCIN) 1,250 mg in sodium chloride 0.9 % 250 mL IVPB     1,250 mg 166.7 mL/hr over 90 Minutes Intravenous STAT 09/12/18 1614 09/12/18 1835   09/12/18 1545  ceFEPIme (MAXIPIME)  2 g in sodium chloride 0.9 % 100 mL IVPB     2 g 200 mL/hr over 30 Minutes Intravenous STAT 09/12/18 1538 09/12/18 1657       Objective: Vitals:   09/14/18 0400 09/14/18 0500 09/14/18 0600 09/14/18 0700  BP: (!) 145/83 138/88 (!) 147/81 140/78  Pulse: 65 64 69 65  Resp: 17 16 19 18   Temp: 98 F (36.7 C)     TempSrc: Oral     SpO2: 97% 97% 99% 97%  Weight:      Height:        Intake/Output Summary (Last 24 hours) at 09/14/2018 0839 Last data filed at 09/14/2018 0600 Gross per 24 hour  Intake 200 ml  Output 1100 ml  Net -900 ml   Filed Weights   09/12/18 1558  Weight: 63 kg    Examination: General exam: Appears calm and comfortable, lethargic  ENT: Dry mucosa  Respiratory system: Clear to auscultation. Respiratory effort normal. Cardiovascular system: S1 & S2 heard, RRR. No JVD, murmurs, rubs, gallops or clicks. No pedal edema. Gastrointestinal system: Abdomen is nondistended, soft and nontender. No organomegaly or masses felt. Normal bowel sounds heard. Central nervous system: Alert and oriented. No focal neurological deficits. Extremities: Symmetric 5 x 5 power. Skin: No rashes, lesions or ulcers Psychiatry: Judgement and insight appear normal. Mood & affect appropriate.    Data Reviewed: I have personally reviewed following labs and imaging studies  CBC: Recent Labs  Lab 09/12/18 1500 09/13/18 0258 09/14/18 0346  WBC 16.2* 10.8* 14.7*  NEUTROABS 13.4*  --   --   HGB 15.3 13.6 14.1  HCT 44.7 40.7 41.2  MCV 90.9 91.9 90.9  PLT 170 120* 397*   Basic Metabolic Panel: Recent Labs  Lab 09/13/18 1154 09/13/18 1621 09/13/18 2035 09/14/18 0033 09/14/18 0346  NA 127* 127* 121* 125* 124*  K 4.3 4.5 4.9 4.7 4.2  CL 105 104 98 104 102  CO2 12* 11* 10* 9* 11*  GLUCOSE 93 95 119* 104* 108*  BUN 55* 53* 50* 49* 47*  CREATININE 2.06* 1.91* 1.89* 1.69* 1.60*  CALCIUM 7.9* 8.0* 7.7* 8.0* 7.9*   GFR: Estimated Creatinine Clearance: 38.9 mL/min (A) (by C-G  formula based on SCr of 1.6 mg/dL (H)). Liver Function Tests: Recent Labs  Lab 09/12/18 1500  AST 94*  ALT 37  ALKPHOS 169*  BILITOT 1.2  PROT 8.2*  ALBUMIN 2.9*   Recent Labs  Lab 09/12/18 1500  LIPASE 64*   Recent Labs  Lab 09/12/18 1500  AMMONIA 22   Coagulation Profile: Recent Labs  Lab 09/12/18 1500  INR 1.15   Cardiac Enzymes: No results for input(s): CKTOTAL, CKMB, CKMBINDEX, TROPONINI in the last 168 hours. BNP (last 3 results) No results for input(s): PROBNP in the last 8760 hours. HbA1C: No results for input(s): HGBA1C in the last 72 hours. CBG: No results for input(s): GLUCAP in the last 168 hours. Lipid Profile: No results for input(s): CHOL, HDL, LDLCALC, TRIG, CHOLHDL, LDLDIRECT in the last 72 hours. Thyroid Function Tests: Recent Labs    09/12/18 2000 09/13/18 0805  TSH 16.088*  --  FREET4  --  0.56*   Anemia Panel: No results for input(s): VITAMINB12, FOLATE, FERRITIN, TIBC, IRON, RETICCTPCT in the last 72 hours. Sepsis Labs: Recent Labs  Lab 09/12/18 1500 09/12/18 1622 09/12/18 2000  PROCALCITON  --   --  34.67  LATICACIDVEN 2.7* 2.7* 2.2*    Recent Results (from the past 240 hour(s))  Culture, blood (Routine x 2)     Status: None (Preliminary result)   Collection Time: 09/12/18  3:00 PM  Result Value Ref Range Status   Specimen Description BLOOD RIGHT ANTECUBITAL  Final   Special Requests   Final    BOTTLES DRAWN AEROBIC AND ANAEROBIC Blood Culture adequate volume Performed at [facility] [address], [place], [place] [address]    Culture NO GROWTH 2 DAYS  Final   Report Status PENDING  Incomplete  Culture, blood (Routine x 2)     Status: None (Preliminary result)   Collection Time: 09/12/18  3:00 PM  Result Value Ref Range Status   Specimen Description BLOOD LEFT ARM  Final   Special Requests   Final    BOTTLES DRAWN AEROBIC AND ANAEROBIC Blood Culture adequate volume Performed at [facility] [address] [place], [place] [address]    Culture NO GROWTH 2 DAYS  Final   Report Status PENDING  Incomplete  Respiratory Panel by PCR     Status: None   Collection Time: 09/12/18  8:15 PM  Result Value Ref Range Status   Adenovirus NOT DETECTED NOT DETECTED Final   Coronavirus 229E NOT DETECTED NOT DETECTED Final    Comment: (NOTE) The Coronavirus on the Respiratory Panel, DOES NOT test for the novel  Coronavirus (2019 nCoV)    Coronavirus HKU1 NOT DETECTED NOT DETECTED Final   Coronavirus NL63 NOT DETECTED NOT DETECTED Final   Coronavirus OC43 NOT DETECTED NOT DETECTED Final   Metapneumovirus NOT DETECTED NOT DETECTED Final   Rhinovirus / Enterovirus NOT DETECTED NOT DETECTED Final   Influenza A NOT DETECTED NOT DETECTED Final   Influenza B NOT DETECTED NOT DETECTED Final   Parainfluenza Virus 1 NOT DETECTED NOT DETECTED Final   Parainfluenza Virus 2 NOT DETECTED NOT DETECTED Final   Parainfluenza Virus 3 NOT DETECTED NOT DETECTED Final   Parainfluenza Virus 4 NOT DETECTED NOT DETECTED Final   Respiratory Syncytial Virus NOT DETECTED NOT DETECTED Final   Bordetella pertussis NOT DETECTED NOT DETECTED Final   Chlamydophila pneumoniae NOT DETECTED NOT DETECTED Final   Mycoplasma pneumoniae NOT DETECTED NOT DETECTED Final    Comment: Performed at [facility], [place]. [address], [place], [place] [address]  MRSA PCR Screening     Status: None   Collection Time: 09/12/18  8:15 PM  Result Value Ref Range Status   MRSA by PCR NEGATIVE NEGATIVE Final    Comment:        The GeneXpert MRSA Assay (FDA approved for NASAL specimens only), is one component of a comprehensive MRSA colonization surveillance program. It is not intended to diagnose MRSA infection nor to guide or monitor treatment for MRSA infections. Performed at [facility], [place] [address] [place], [place] [address]        Radiology Studies: Dg Chest 2  View  Result Date: 09/12/2018 CLINICAL DATA:  Generalized weakness and vomiting. Patient is being treated with chemotherapy for renal cancer. EXAM: CHEST - 2 VIEW COMPARISON:  None. FINDINGS: Cardiomediastinal silhouette is normal. Mediastinal contours appear intact. There is  no evidence of pleural effusion or pneumothorax. Dense airspace consolidation in the posterior segment of the left upper lobe Osseous structures are without acute abnormality. Soft tissues are grossly normal. IMPRESSION: Dense airspace consolidation in the posterior segment of the left upper lobe. In the acute clinical settings this likely represents pneumonia, however please follow-up to resolution after empiric treatment to exclude underlying pulmonary mass. Electronically Signed   By: Fidela Salisbury M.D.   On: 09/12/2018 15:55      Scheduled Meds: . amLODipine  10 mg Oral Daily  . cholestyramine light  4 g Oral BID  . enoxaparin (LOVENOX) injection  40 mg Subcutaneous Daily  . [START ON 09/15/2018] levothyroxine  150 mcg Oral Q0600   Continuous Infusions: . sodium chloride    . ceFEPime (MAXIPIME) IV Stopped (09/14/18 0559)     LOS: 2 days    Time spent: 25 minutes   Dessa Phi, DO [facility] [email or web address] 09/14/2018, 8:39 AM

## 2018-09-15 ENCOUNTER — Inpatient Hospital Stay (HOSPITAL_COMMUNITY): Payer: Medicare Other

## 2018-09-15 DIAGNOSIS — E871 Hypo-osmolality and hyponatremia: Secondary | ICD-10-CM

## 2018-09-15 DIAGNOSIS — K7030 Alcoholic cirrhosis of liver without ascites: Secondary | ICD-10-CM

## 2018-09-15 DIAGNOSIS — C649 Malignant neoplasm of unspecified kidney, except renal pelvis: Secondary | ICD-10-CM

## 2018-09-15 LAB — BLOOD GAS, VENOUS
Acid-base deficit: 10.6 mmol/L — ABNORMAL HIGH (ref 0.0–2.0)
Bicarbonate: 13.1 mmol/L — ABNORMAL LOW (ref 20.0–28.0)
FIO2: 21
O2 Saturation: 49 %
Patient temperature: 98.6
pCO2, Ven: 24.3 mmHg — ABNORMAL LOW (ref 44.0–60.0)
pH, Ven: 7.35 (ref 7.250–7.430)

## 2018-09-15 LAB — CBC
HEMATOCRIT: 42.3 % (ref 39.0–52.0)
Hemoglobin: 14.3 g/dL (ref 13.0–17.0)
MCH: 31.2 pg (ref 26.0–34.0)
MCHC: 33.8 g/dL (ref 30.0–36.0)
MCV: 92.2 fL (ref 80.0–100.0)
Platelets: 146 10*3/uL — ABNORMAL LOW (ref 150–400)
RBC: 4.59 MIL/uL (ref 4.22–5.81)
RDW: 15.1 % (ref 11.5–15.5)
WBC: 10.2 10*3/uL (ref 4.0–10.5)
nRBC: 0 % (ref 0.0–0.2)

## 2018-09-15 LAB — GASTROINTESTINAL PANEL BY PCR, STOOL (REPLACES STOOL CULTURE)

## 2018-09-15 LAB — BASIC METABOLIC PANEL
ANION GAP: 9 (ref 5–15)
BUN: 39 mg/dL — ABNORMAL HIGH (ref 8–23)
CO2: 12 mmol/L — ABNORMAL LOW (ref 22–32)
Calcium: 7.7 mg/dL — ABNORMAL LOW (ref 8.9–10.3)
Chloride: 110 mmol/L (ref 98–111)
Creatinine, Ser: 1.48 mg/dL — ABNORMAL HIGH (ref 0.61–1.24)
GFR calc non Af Amer: 48 mL/min — ABNORMAL LOW (ref >60)
GFR, EST AFRICAN AMERICAN: 55 mL/min — AB (ref >60)
Glucose, Bld: 94 mg/dL (ref 70–99)
Potassium: 4.1 mmol/L (ref 3.5–5.1)
Sodium: 131 mmol/L — ABNORMAL LOW (ref 135–145)

## 2018-09-15 MED ORDER — GADOBUTROL 1 MMOL/ML IV SOLN
6.5000 mL | Freq: Once | INTRAVENOUS | Status: AC | PRN
  Administered 2018-09-15: 6.5 mL via INTRAVENOUS

## 2018-09-15 NOTE — Unmapped (Signed)
Hi,    Patient Sergio Horton contacted the Communication Center to cancel their appointment for Wednesday, February 26th.  The appointment has been cancelled.    Cancellation Reason: Admitted to Emergency Department at outside hospital.  Patient was admitted to [facility] last Friday. Shirleen, patients wife said he is unable to talk or walk.    Shirleen would like to you to call her at [phone]    Thank you,  Maryclare Bean  [facility]   [phone]

## 2018-09-15 NOTE — Progress Notes (Signed)
Pharmacy Antibiotic Note  Stephen Grimes is a 68 y.o. male with metastatic renal cell carcinoma admitted on 09/12/2018 with pneumonia and sepsis.  Pharmacy has been consulted for Vancomycin, Cefepime dosing.  Today, 09/15/2018 Day #4 cefepime Afebrile WBC 10.2, improved SCr 1.48, improved CrCl ~42 ml/min  Plan: Cefepime 2g IV x1, then 1g IV q12h Follow up renal fxn, culture results, and clinical course. F/u ability to de-escalate antibiotics.   Height: 5\' 7"  (170.2 cm) Weight: 139 lb (63 kg) IBW/kg (Calculated) : 66.1  Temp (24hrs), Avg:97.8 F (36.6 C), Min:96.9 F (36.1 C), Max:98.3 F (36.8 C)  Recent Labs  Lab 09/12/18 1500 09/12/18 1622 09/12/18 2000 09/13/18 0258  09/13/18 1621 09/13/18 2035 09/14/18 0033 09/14/18 0346 09/15/18 0348  WBC 16.2*  --   --  10.8*  --   --   --   --  14.7* 10.2  CREATININE 3.36*  --  2.70* 2.27*   < > 1.91* 1.89* 1.69* 1.60* 1.48*  LATICACIDVEN 2.7* 2.7* 2.2*  --   --   --   --   --   --   --    < > = values in this interval not displayed.    Estimated Creatinine Clearance: 42 mL/min (A) (by C-G formula based on SCr of 1.48 mg/dL (H)).    No Known Allergies  Antimicrobials this admission:  2/21 Cefepime >>  2/21 Vancomycin >> 2/22  Dose adjustments this admission:   Microbiology results:  2/21 BCx: ngtd 2/21 UCx: ngf 2/21 MRSA PCR: negative 2/21 Respiratory panel PCR: negative 2/22 GI panel: sent  Thank you for allowing pharmacy to be a part of this patient's care.  Peggyann Juba, PharmD, BCPS Pager: [phone] 09/15/2018 9:06 AM

## 2018-09-15 NOTE — Evaluation (Signed)
Occupational Therapy Evaluation Patient Details Name: Stephen Grimes MRN: [id number] DOB: 1951/01/14 Today's Date: 09/15/2018    History of Present Illness Stephen Grimes is a 68 y.o. male with a past medical history of metastatic renal cell carcinoma, alcoholic liver cirrhosis, essential hypertension, hypothyroidism admitted with severe hyponatremia, acute renal failure and HCAP.   Clinical Impression   This 68 y/o male presents with the above. PTA pt reports he is mod independent with ADL and functional mobility using two SPCs. Pt with increased lethargy today and overall presenting with generalized weakness and fatigue. Pt requires increased time, repetition and cues to respond to questions and instruction, though suspect this may partly be due to pt's fluctuating level of lethargy. He will benefit from continued acute and post acute OT services to maximize his activity tolerance, strength, safety and independence with ADL and mobility. Will follow.     Follow Up Recommendations  Home health OT;Supervision/Assistance - 24 hour    Equipment Recommendations  3 in 1 bedside commode           Precautions / Restrictions Precautions Precautions: Fall Restrictions Weight Bearing Restrictions: No      Mobility Bed Mobility               General bed mobility comments: OOB in recliner upon arrival                                                                   ADL either performed or assessed with clinical judgement   ADL Overall ADL's : Needs assistance/impaired Eating/Feeding: Minimal assistance;Set up;Sitting Eating/Feeding Details (indicate cue type and reason): per RN pt was able to feed himself lunch today, pt also reports he had more of an appetite today Grooming: Set up;Sitting   Upper Body Bathing: Minimal assistance;Sitting       Upper Body Dressing : Minimal assistance;Sitting     Lower Body Dressing Details (indicate cue  type and reason): pt is able to bring LE up towards figure 4 position during session fairly easily, too lethargic further assess mobility safely               General ADL Comments: pt with increased lethargy, easily able to arouse but often dozing off during session, pt had just recently worked with PT so suspect more fatigued after PT session                         Pertinent Vitals/Pain Pain Assessment: No/denies pain     Hand Dominance     Extremity/Trunk Assessment Upper Extremity Assessment Upper Extremity Assessment: Generalized weakness   Lower Extremity Assessment Lower Extremity Assessment: Defer to PT evaluation       Communication Communication Communication: No difficulties   Cognition Arousal/Alertness: Awake/alert Behavior During Therapy: WFL for tasks assessed/performed Overall Cognitive Status: Impaired/Different from baseline Area of Impairment: Following commands                       Following Commands: Follows one step commands consistently;Follows one step commands with increased time       General Comments: pt very sleepy this session, required increased time and cues to remain awake/alert and to respond to therapist questions  General Comments       Exercises     Shoulder Instructions      Home Living Family/patient expects to be discharged to:: Private residence Living Arrangements: Spouse/significant other Available Help at Discharge: Family Type of Home: House Home Access: Stairs to enter [employer] of Steps: 3 Entrance Stairs-Rails: Right Home Layout: Two level;Able to live on main level with bedroom/bathroom     Bathroom Shower/Tub: Tub/shower unit;Walk-in shower(tub shower in pt's bathroom)   Bathroom Toilet: Standard     Home Equipment: Cane - single point   Additional Comments: walked with 2 canes just prior to admission after gradual decline over couple months      Prior  Functioning/Environment Level of Independence: Independent with assistive device(s)        Comments: reports needed no assist for ADL, 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 Problem List: Decreased strength;Decreased range of motion;Decreased activity tolerance;Decreased cognition;Impaired balance (sitting and/or standing)      OT Treatment/Interventions: Self-care/ADL training;Therapeutic exercise;Energy conservation;DME and/or AE instruction;Therapeutic activities;Patient/family education;Balance training;Cognitive remediation/compensation    OT Goals(Current goals can be found in the care plan section) Acute Rehab OT Goals Patient Stated Goal: to return to independent OT Goal Formulation: With patient Time For Goal Achievement: 09/29/18 Potential to Achieve Goals: Good  OT Frequency: Min 2X/week   Barriers to D/C:            Co-evaluation              AM-PAC OT "6 Clicks" Daily Activity     Outcome Measure Help from another person eating meals?: A Little Help from another person taking care of personal grooming?: A Little Help from another person toileting, which includes using toliet, bedpan, or urinal?: A Lot Help from another person bathing (including washing, rinsing, drying)?: A Lot Help from another person to put on and taking off regular upper body clothing?: A Lot Help from another person to put on and taking off regular lower body clothing?: A Lot 6 Click Score: 14   End of Session Nurse Communication: Mobility status  Activity Tolerance: Patient limited by fatigue Patient left: in chair;with call bell/phone within reach  OT Visit Diagnosis: Muscle weakness (generalized) (M62.81)                Time: 0998-3382 OT Time Calculation (min): 15 min Charges:  OT General Charges $OT Visit: 1 Visit OT Evaluation $OT Eval Moderate Complexity: 1 Mod  Lou Cal, OT [employer] Pager [phone] Office  [phone]  Raymondo Band 09/15/2018, 4:26 PM

## 2018-09-15 NOTE — Progress Notes (Signed)
Physical Therapy Treatment Patient Details Name: Stephen Grimes MRN: [id number] DOB: 08-01-1950 Today's Date: 09/15/2018    History of Present Illness Stephen Grimes is a 68 y.o. male with a past medical history of metastatic renal cell carcinoma, alcoholic liver cirrhosis, essential hypertension, hypothyroidism admitted with severe hyponatremia, acute renal failure and HCAP.    PT Comments    Patient progressing with distance with ambulation and responsiveness to commands, though still delayed.  Able to improve efficiency and better balance with increased step length on L.  PT to follow acutely.  HHPT remains appropriate.    Follow Up Recommendations  Home health PT;Supervision/Assistance - 24 hour     Equipment Recommendations  Rolling walker with 5" wheels    Recommendations for Other Services       Precautions / Restrictions Precautions Precautions: Fall Restrictions Weight Bearing Restrictions: No    Mobility  Bed Mobility Overal bed mobility: Needs Assistance Bed Mobility: Supine to Sit     Supine to sit: HOB elevated;Min assist     General bed mobility comments: for trunk and scooting to EOB  Transfers Overall transfer level: Needs assistance Equipment used: Rolling walker (2 wheeled) Transfers: Sit to/from Stand Sit to Stand: Min assist         General transfer comment: assist for balance  Ambulation/Gait Ambulation/Gait assistance: Min assist Gait Distance (Feet): 60 Feet Assistive device: Rolling walker (2 wheeled) Gait Pattern/deviations: Step-through pattern;Decreased stride length;Shuffle     General Gait Details: decreased L step length, improved with cues and improved efficiency   Stairs             Wheelchair Mobility    Modified Rankin (Stroke Patients Only)       Balance Overall balance assessment: Needs assistance Sitting-balance support: Feet supported Sitting balance-Leahy Scale: Fair     Standing balance support:  Bilateral upper extremity supported Standing balance-Leahy Scale: Poor Standing balance comment: UE support for balance                            Cognition Arousal/Alertness: Awake/alert Behavior During Therapy: WFL for tasks assessed/performed Overall Cognitive Status: Impaired/Different from baseline Area of Impairment: Following commands                       Following Commands: Follows one step commands consistently;Follows one step commands with increased time       General Comments: pt very sleepy this session, required increased time and cues to remain awake/alert and to respond to therapist questions      Exercises      General Comments        Pertinent Vitals/Pain Pain Assessment: No/denies pain    Home Living Family/patient expects to be discharged to:: Private residence Living Arrangements: Spouse/significant other Available Help at Discharge: Family Type of Home: House Home Access: Stairs to enter Entrance Stairs-Rails: Right Home Layout: Two level;Able to live on main level with bedroom/bathroom Home Equipment: Cane - single point Additional Comments: walked with 2 canes just prior to admission after gradual decline over couple months    Prior Function Level of Independence: Independent with assistive device(s)      Comments: reports needed no assist for ADL, 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   PT Goals (current goals can now be found in the care plan section) Acute Rehab PT Goals Patient Stated Goal: to return to independent Progress towards PT goals:  Progressing toward goals    Frequency    Min 3X/week      PT Plan Current plan remains appropriate    Co-evaluation              AM-PAC PT "6 Clicks" Mobility   Outcome Measure  Help needed turning from your back to your side while in a flat bed without using bedrails?: A Little Help needed moving from lying on your back to sitting on the side of a  flat bed without using bedrails?: A Little Help needed moving to and from a bed to a chair (including a wheelchair)?: A Little Help needed standing up from a chair using your arms (e.g., wheelchair or bedside chair)?: A Little Help needed to walk in hospital room?: A Little Help needed climbing 3-5 steps with a railing? : A Little 6 Click Score: 18    End of Session Equipment Utilized During Treatment: Gait belt Activity Tolerance: Patient tolerated treatment well Patient left: with call bell/phone within reach;in chair;with chair alarm set   PT Visit Diagnosis: Other abnormalities of gait and mobility (R26.89);Muscle weakness (generalized) (M62.81)     Time: 6387-5643 PT Time Calculation (min) (ACUTE ONLY): 26 min  Charges:  $Gait Training: 8-22 mins $Therapeutic Activity: 8-22 mins                     Magda Kiel, [place] [facility] [phone] 09/15/2018    Stephen Grimes 09/15/2018, 4:46 PM

## 2018-09-15 NOTE — Progress Notes (Signed)
Patient ID: Stephen Grimes, male   DOB: 16-May-1951, 68 y.o.   MRN: [id number]  PROGRESS NOTE    RICHEY DOOLITTLE  JXB:[id number] DOB: 1951-01-30 DOA: 09/12/2018 PCP: Stephens Shire, MD   Brief Narrative:  68 year old male with history of metastatic renal carcinoma, alcoholic liver cirrhosis, essential hypertension, hypothyroidism who was brought in by family members on 09/12/2022 poor oral intake the last few days.  She was found to have sodium of 118 on presentation with creatinine of 3.36.  Ammonia was normal.  White count of 16.2.  Chest x-ray raise concern for left lung consolidation.  He was admitted for pneumonia as well as hyponatremia.   Assessment & Plan:   Principal Problem:   HCAP (healthcare-associated pneumonia) Active Problems:   AKI (acute kidney injury) ([place])   Alcoholic cirrhosis of liver without ascites ([facility])   Hypothyroidism, unspecified   Malignant neoplasm of unspecified kidney, except renal pelvis ([facility])   Hyponatremia   Acute metabolic encephalopathy   Sepsis ([place])   Sepsis secondary to pneumonia -Cultures negative so far.  Hemodynamically improving.  Procalcitonin was 34.67 on admission.  Will repeat procalcitonin in a.m. -Influenza PCR negative.  Respiratory viral panel negative -Antibiotic plan as below  Left upper lobe HCAP -Off vancomycin.  Continue cefepime. -Respiratory status stable.  Currently on oxygen via nasal cannula at 2 L/min.  Wean off as able.  Incentive spirometry  Hypovolemic hyponatremia -Presented with sodium of 118.  Treated with IV fluids.  131 today.  Decrease normal saline to 75 cc an hour.  Continue to trend BMP  Nausea, vomiting, diarrhea  -Improving.  Questionable cause.  GI PCR pending. -Oral intake still poor.  Encourage patient to improve oral intake  Acute metabolic encephalopathy -Most likely from pneumonia and hyponatremia. -CT of the brain was negative for any acute abnormality.  Patient was supposed to have an MRI of  the brain with/without contrast as an outpatient this week as per [facility].  Will get MRI of the brain while he is in the hospital.  Fall precautions.  PT re-eval.  Monitor mental status.  Will check vitamin B12, folate levels in a.m. -SLP evaluation  Essential hypertension -Blood pressure improving.  Continue amlodipine.  Losartan on hold  Acute kidney injury on chronic kidney disease stage III -Creatinine improving.  Monitor creatinine  History of alcoholic cirrhosis -We will check ammonia level in a.m.  If mental status does not improve, might need ultrasound of abdomen.  Not on any diuretics currently  Hypothyroidism -TSH was elevated at 16, free T4 low -Levothyroxine dose was increased from today morning onwards.  Continue  Metastatic renal cancer -Follows at [facility].  Continue outpatient follow-up  Generalized deconditioning -PT eval.   DVT prophylaxis: Lovenox Code Status: Full Family Communication: Wife at bedside Disposition Plan: Home in 1 to 3 days pending clinical improvement  Consultants: None  Procedures: None  Antimicrobials: Vancomycin on 09/12/2018 Cefepime from 09/12/2018 onwards   Subjective: Patient seen and examined at bedside.  He is sleepy, wakes up and answers very few questions.  Very slow to respond.  Wife at bedside state that he had good dinner last night.  No overnight fever or vomiting  Objective: Vitals:   09/15/18 0700 09/15/18 0800 09/15/18 0900 09/15/18 1000  BP: 121/69 (!) 146/72 (!) 147/71 (!) 142/73  Pulse: 68 62 (!) 127 70  Resp: 16 18 17  (!) 23  Temp:  97.9 F (36.6 C)    TempSrc:  Oral    SpO2:  100% 99% 100% 97%  Weight:      Height:        Intake/Output Summary (Last 24 hours) at 09/15/2018 1156 Last data filed at 09/15/2018 1100 Gross per 24 hour  Intake 2622.88 ml  Output 1670 ml  Net 952.88 ml   Filed Weights   09/12/18 1558  Weight: 63 kg    Examination:  General exam: Appears ill.  Looks older than stated age.   Sleepy, no distress Respiratory system: Bilateral decreased breath sounds at bases, scattered crackles Cardiovascular system: S1 & S2 heard, intermittently tachycardic Gastrointestinal system: Abdomen is nondistended, soft and nontender. Normal bowel sounds heard. Extremities: No cyanosis, clubbing, edema  Central nervous system: Sleepy, wakes up slightly intermittently and answers very few questions.  Very slow to respond. No focal neurological deficits. Moving extremities Skin: No rashes, lesions or ulcers Psychiatry: Could not be examined because of patient being so sleepy.    Data Reviewed: I have personally reviewed following labs and imaging studies  CBC: Recent Labs  Lab 09/12/18 1500 09/13/18 0258 09/14/18 0346 09/15/18 0348  WBC 16.2* 10.8* 14.7* 10.2  NEUTROABS 13.4*  --   --   --   HGB 15.3 13.6 14.1 14.3  HCT 44.7 40.7 41.2 42.3  MCV 90.9 91.9 90.9 92.2  PLT 170 120* 141* 267*   Basic Metabolic Panel: Recent Labs  Lab 09/13/18 1621 09/13/18 2035 09/14/18 0033 09/14/18 0346 09/15/18 0348  NA 127* 121* 125* 124* 131*  K 4.5 4.9 4.7 4.2 4.1  CL 104 98 104 102 110  CO2 11* 10* 9* 11* 12*  GLUCOSE 95 119* 104* 108* 94  BUN 53* 50* 49* 47* 39*  CREATININE 1.91* 1.89* 1.69* 1.60* 1.48*  CALCIUM 8.0* 7.7* 8.0* 7.9* 7.7*   GFR: Estimated Creatinine Clearance: 42 mL/min (A) (by C-G formula based on SCr of 1.48 mg/dL (H)). Liver Function Tests: Recent Labs  Lab 09/12/18 1500  AST 94*  ALT 37  ALKPHOS 169*  BILITOT 1.2  PROT 8.2*  ALBUMIN 2.9*   Recent Labs  Lab 09/12/18 1500  LIPASE 64*   Recent Labs  Lab 09/12/18 1500  AMMONIA 22   Coagulation Profile: Recent Labs  Lab 09/12/18 1500  INR 1.15   Cardiac Enzymes: No results for input(s): CKTOTAL, CKMB, CKMBINDEX, TROPONINI in the last 168 hours. BNP (last 3 results) No results for input(s): PROBNP in the last 8760 hours. HbA1C: No results for input(s): HGBA1C in the last 72  hours. CBG: No results for input(s): GLUCAP in the last 168 hours. Lipid Profile: No results for input(s): CHOL, HDL, LDLCALC, TRIG, CHOLHDL, LDLDIRECT in the last 72 hours. Thyroid Function Tests: Recent Labs    09/12/18 2000 09/13/18 0805  TSH 16.088*  --   FREET4  --  0.56*   Anemia Panel: No results for input(s): VITAMINB12, FOLATE, FERRITIN, TIBC, IRON, RETICCTPCT in the last 72 hours. Sepsis Labs: Recent Labs  Lab 09/12/18 1500 09/12/18 1622 09/12/18 2000  PROCALCITON  --   --  34.67  LATICACIDVEN 2.7* 2.7* 2.2*    Recent Results (from the past 240 hour(s))  Culture, blood (Routine x 2)     Status: None (Preliminary result)   Collection Time: 09/12/18  3:00 PM  Result Value Ref Range Status   Specimen Description BLOOD RIGHT ANTECUBITAL  Final   Special Requests   Final    BOTTLES DRAWN AEROBIC AND ANAEROBIC Blood Culture adequate volume Performed at [facility] [address]  [place], [place] [address]    Culture NO GROWTH 2 DAYS  Final   Report Status PENDING  Incomplete  Culture, blood (Routine x 2)     Status: None (Preliminary result)   Collection Time: 09/12/18  3:00 PM  Result Value Ref Range Status   Specimen Description BLOOD LEFT ARM  Final   Special Requests   Final    BOTTLES DRAWN AEROBIC AND ANAEROBIC Blood Culture adequate volume Performed at [facility] [address] [place], [place] [address]    Culture NO GROWTH 2 DAYS  Final   Report Status PENDING  Incomplete  Urine culture     Status: None   Collection Time: 09/12/18  6:28 PM  Result Value Ref Range Status   Specimen Description   Final    URINE, CLEAN CATCH Performed at [facility], [place] [address], [place], [place] [address]    Special Requests   Final    NONE Performed at [facility], [place] [address], [place], [place] [address]    Culture   Final    NO GROWTH Performed at [facility],  [place] [address] [place], [place] [address]    Report Status 09/14/2018 FINAL  Final  Respiratory Panel by PCR     Status: None   Collection Time: 09/12/18  8:15 PM  Result Value Ref Range Status   Adenovirus NOT DETECTED NOT DETECTED Final   Coronavirus 229E NOT DETECTED NOT DETECTED Final    Comment: (NOTE) The Coronavirus on the Respiratory Panel, DOES NOT test for the novel  Coronavirus (2019 nCoV)    Coronavirus HKU1 NOT DETECTED NOT DETECTED Final   Coronavirus NL63 NOT DETECTED NOT DETECTED Final   Coronavirus OC43 NOT DETECTED NOT DETECTED Final   Metapneumovirus NOT DETECTED NOT DETECTED Final   Rhinovirus / Enterovirus NOT DETECTED NOT DETECTED Final   Influenza A NOT DETECTED NOT DETECTED Final   Influenza B NOT DETECTED NOT DETECTED Final   Parainfluenza Virus 1 NOT DETECTED NOT DETECTED Final   Parainfluenza Virus 2 NOT DETECTED NOT DETECTED Final   Parainfluenza Virus 3 NOT DETECTED NOT DETECTED Final   Parainfluenza Virus 4 NOT DETECTED NOT DETECTED Final   Respiratory Syncytial Virus NOT DETECTED NOT DETECTED Final   Bordetella pertussis NOT DETECTED NOT DETECTED Final   Chlamydophila pneumoniae NOT DETECTED NOT DETECTED Final   Mycoplasma pneumoniae NOT DETECTED NOT DETECTED Final    Comment: Performed at [facility], [place]. [address] [place], [place] [address]  MRSA PCR Screening     Status: None   Collection Time: 09/12/18  8:15 PM  Result Value Ref Range Status   MRSA by PCR NEGATIVE NEGATIVE Final    Comment:        The GeneXpert MRSA Assay (FDA approved for NASAL specimens only), is one component of a comprehensive MRSA colonization surveillance program. It is not intended to diagnose MRSA infection nor to guide or monitor treatment for MRSA infections. Performed at [facility], [place] [address], [place], [place] [address]          Radiology Studies: Ct Head Wo Contrast  Result Date: 09/14/2018 CLINICAL DATA:   Encephalopathy. History of metastatic renal cell carcinoma, cirrhosis, hypertension. EXAM: CT HEAD WITHOUT CONTRAST TECHNIQUE: Contiguous axial images were obtained from the base of the skull through the vertex without intravenous contrast. COMPARISON:  None. FINDINGS: BRAIN: No intraparenchymal hemorrhage, mass effect nor midline shift. No parenchymal brain  volume loss for age. No hydrocephalus. Minimal supratentorial white matter hypodensities within normal range for patient's age, though non-specific are most compatible with chronic small vessel ischemic disease. No acute large vascular territory infarcts. No abnormal extra-axial fluid collections. Basal cisterns are patent. VASCULAR: Mild calcific atherosclerosis of the carotid siphons. SKULL: No skull fracture. No significant scalp soft tissue swelling. SINUSES/ORBITS: Hypoplastic frontal sinuses. Mild sphenoid ethmoidal mucosal thickening. Mastoid air cells are well aerated. Included ocular globes and orbital contents are non-suspicious. OTHER: None. IMPRESSION: Negative CT HEAD without contrast for age. Electronically Signed   By: Elon Alas M.D.   On: 09/14/2018 16:05        Scheduled Meds: . amLODipine  10 mg Oral Daily  . cholestyramine light  4 g Oral BID  . enoxaparin (LOVENOX) injection  40 mg Subcutaneous Daily  . levothyroxine  150 mcg Oral Q0600   Continuous Infusions: . sodium chloride 75 mL/hr at 09/15/18 1008  . ceFEPime (MAXIPIME) IV Stopped (09/15/18 8264)     LOS: 3 days        Aline August, MD [facility] 09/15/2018, 11:56 AM

## 2018-09-16 ENCOUNTER — Inpatient Hospital Stay (HOSPITAL_COMMUNITY): Payer: Medicare Other

## 2018-09-16 LAB — COMPREHENSIVE METABOLIC PANEL
ALT: 24 U/L (ref 0–44)
AST: 55 U/L — ABNORMAL HIGH (ref 15–41)
Albumin: 1.6 g/dL — ABNORMAL LOW (ref 3.5–5.0)
Alkaline Phosphatase: 131 U/L — ABNORMAL HIGH (ref 38–126)
Anion gap: 6 (ref 5–15)
BUN: 28 mg/dL — ABNORMAL HIGH (ref 8–23)
CO2: 11 mmol/L — AB (ref 22–32)
Calcium: 7.2 mg/dL — ABNORMAL LOW (ref 8.9–10.3)
Chloride: 114 mmol/L — ABNORMAL HIGH (ref 98–111)
Creatinine, Ser: 1.23 mg/dL (ref 0.61–1.24)
GFR calc Af Amer: >60 mL/min (ref >60)
GFR calc non Af Amer: 60 mL/min — ABNORMAL LOW (ref >60)
GLUCOSE: 90 mg/dL (ref 70–99)
Potassium: 3.7 mmol/L (ref 3.5–5.1)
Sodium: 131 mmol/L — ABNORMAL LOW (ref 135–145)
Total Bilirubin: 0.7 mg/dL (ref 0.3–1.2)
Total Protein: 4.8 g/dL — ABNORMAL LOW (ref 6.5–8.1)

## 2018-09-16 LAB — CBC WITH DIFFERENTIAL/PLATELET
ABS IMMATURE GRANULOCYTES: 0.19 10*3/uL — AB (ref 0.00–0.07)
Basophils Absolute: 0 10*3/uL (ref 0.0–0.1)
Basophils Relative: 0 %
Eosinophils Absolute: 0.1 10*3/uL (ref 0.0–0.5)
Eosinophils Relative: 1 %
HCT: 31.8 % — ABNORMAL LOW (ref 39.0–52.0)
HEMOGLOBIN: 11.2 g/dL — AB (ref 13.0–17.0)
Immature Granulocytes: 2 %
LYMPHS ABS: 0.7 10*3/uL (ref 0.7–4.0)
LYMPHS PCT: 8 %
MCH: 31.2 pg (ref 26.0–34.0)
MCHC: 35.2 g/dL (ref 30.0–36.0)
MCV: 88.6 fL (ref 80.0–100.0)
Monocytes Absolute: 0.9 10*3/uL (ref 0.1–1.0)
Monocytes Relative: 11 %
Neutro Abs: 6.5 10*3/uL (ref 1.7–7.7)
Neutrophils Relative %: 78 %
Platelets: 153 10*3/uL (ref 150–400)
RBC: 3.59 MIL/uL — ABNORMAL LOW (ref 4.22–5.81)
RDW: 15.3 % (ref 11.5–15.5)
WBC: 8.3 10*3/uL (ref 4.0–10.5)
nRBC: 0 % (ref 0.0–0.2)

## 2018-09-16 LAB — MAGNESIUM: Magnesium: 1.8 mg/dL (ref 1.7–2.4)

## 2018-09-16 LAB — AMMONIA: Ammonia: 42 umol/L — ABNORMAL HIGH (ref 9–35)

## 2018-09-16 MED ORDER — SODIUM BICARBONATE 8.4 % IV SOLN
INTRAVENOUS | Status: DC
  Administered 2018-09-16 – 2018-09-18 (×3): via INTRAVENOUS
  Filled 2018-09-16 (×3): qty 150

## 2018-09-16 MED ORDER — OMEPRAZOLE 20 MG CAPSULE,DELAYED RELEASE
ORAL_CAPSULE | 0 refills | 0 days | Status: CP
Start: 2018-09-16 — End: 2018-10-16

## 2018-09-16 NOTE — Progress Notes (Signed)
  Speech Language Pathology Treatment: Dysphagia  Patient Details Name: Stephen Grimes MRN: [id number] DOB: 1950-12-30 Today's Date: 09/16/2018 Time: 9449-6759 SLP Time Calculation (min) (ACUTE ONLY): 19 min  Assessment / Plan / Recommendation Clinical Impression  Pt continues with hiccups and frequent belching which has been an ongoing issue for him. Wife and sister in law present report pt is stronger than he has been and he consumed all of his breakfast today!    Pt likes peanut butter and was open to consume regular//thin diet to assess for readiness for dietary advancement.  Pt self fed graham crackers, peanut butter and water.  No overt indication of aspiration - subtle throat clearing and frequent belching observed after intake.  He states his belching is not accompanied by reflux. Advised him and family to primary risk with swallowing is his belching/vomiting etc prior to admit. Provided written esophageal precautions (small frequent meals, avoid mint, etc) to spouse/family and reviewed heimlich maneuver.  No SLP follow up indicated at this time as all education completed and pt tolerating minimal po he is consuming.     HPI HPI: Patient is a 68 y.o. male with PMH: metastatic renal cell carcinoma, alcoholic liver cirrhosis, essential HTN, hypothyroidism, who was brought to ER by family members secondary to very poor oral intake, nausea and vomitting and SOB the past few days prior to admission. Patient was confused upon admission, exhibiting acute renal failure, hyponatremia and with chest x-ray concerning for left lung consolidation.      SLP Plan  All goals met       Recommendations  Diet recommendations: Regular;Thin liquid Liquids provided via: Straw Medication Administration: Whole meds with liquid Supervision: Patient able to self feed Compensations: Slow rate;Small sips/bites Postural Changes and/or Swallow Maneuvers: Seated upright 90 degrees;Upright 30-60 min after meal                 Oral Care Recommendations: Oral care before and after PO Follow up Recommendations: None SLP Visit Diagnosis: Dysphagia, unspecified (R13.10) Plan: All goals met       GO                Macario Golds 09/16/2018, 2:35 PM   Luanna Salk, [place] [facility] SLP [place] Pager [phone] Office [phone]

## 2018-09-16 NOTE — Progress Notes (Signed)
Patient ID: Stephen Grimes, male   DOB: 02-May-1951, 68 y.o.   MRN: [id number]  PROGRESS NOTE    Stephen Grimes  TIW:[id number] DOB: Sep 17, 1950 DOA: 09/12/2018 PCP: Seward Carol, MD   Brief Narrative:  68 year old male with history of metastatic renal carcinoma, alcoholic liver cirrhosis, essential hypertension, hypothyroidism who was brought in by family members on 09/12/2022 poor oral intake the last few days.  She was found to have sodium of 118 on presentation with creatinine of 3.36.  Ammonia was normal.  White count of 16.2.  Chest x-ray raise concern for left lung consolidation.  He was admitted for pneumonia as well as hyponatremia.   Assessment & Plan:   Principal Problem:   HCAP (healthcare-associated pneumonia) Active Problems:   AKI (acute kidney injury) ([employer])   Alcoholic cirrhosis of liver without ascites (HCC)   Hypothyroidism, unspecified   Malignant neoplasm of unspecified kidney, except renal pelvis (HCC)   Hyponatremia   Acute metabolic encephalopathy   Sepsis ([place])   Sepsis secondary to pneumonia -Cultures negative so far.  Hemodynamically improving.  Procalcitonin was 34.67 on admission.   -Influenza PCR negative.  Respiratory viral panel negative -Antibiotic plan as below  Left upper lobe HCAP -Off vancomycin.  Continue cefepime, today is day #5. -Respiratory status stable.  Currently on room air. Incentive spirometry  Hypovolemic hyponatremia with metabolic acidosis -Presented with sodium of 118.  Treated with IV fluids.  131 today. Will switch iv fluids to sodium bicarbonate drip at 75cc/hr. -Continue to trend BMP  Nausea, vomiting, diarrhea  -Improving.  Questionable cause.  GI PCR negative. -Diarrhea improving. Oral intake is improving as well.  Acute metabolic encephalopathy -Most likely from pneumonia and hyponatremia. -CT of the brain was negative for any acute abnormality.  Patient was supposed to have an MRI of the brain with/without contrast  as an outpatient this week as per [facility].   - MRI brain was negative for any acute abnormalities.  Fall precautions.  -Mental status has much improved.  Continue monitoring mental status.  Ammonia is 42 this morning but patient is having bowel movements.  Will hold off on lactulose. -PT OT recommend home health PT/OT  Essential hypertension -Blood pressure stable.  Continue amlodipine.  Losartan on hold  Acute kidney injury on chronic kidney disease stage III -Creatinine improving.  Monitor creatinine  History of alcoholic cirrhosis - Not on any diuretics currently.  Outpatient follow-up  Hypothyroidism -TSH was elevated at 16, free T4 low -Levothyroxine dose was increased from 09/15/2018 onwards.  Continue  Metastatic renal cancer -Follows at [facility].  Continue outpatient follow-up  Generalized deconditioning -PT recommend home health PT/OT.  Care management consult.   DVT prophylaxis: Lovenox Code Status: Full Family Communication: Wife at bedside Disposition Plan: Home in 1 to 2 days pending clinical improvement  Consultants: None  Procedures: None  Antimicrobials: Vancomycin on 09/12/2018 Cefepime from 09/12/2018 onwards   Subjective: Patient seen and examined at bedside.  He is more awake this morning, having breakfast, answers questions more appropriately.  Denies any overnight fever, nausea or vomiting.  Wife at bedside is concerned that he is still very weak but looks better. Objective: Vitals:   09/15/18 1431 09/15/18 2211 09/16/18 0521 09/16/18 0929  BP: 138/85 129/79 131/73 121/72  Pulse: 85 77 76 81  Resp: 20 17 19  (!) 22  Temp:  98.6 F (37 C) 98.7 F (37.1 C) 98.8 F (37.1 C)  TempSrc:    Oral  SpO2: 96% 96% 96% 95%  Weight:      Height:        Intake/Output Summary (Last 24 hours) at 09/16/2018 1004 Last data filed at 09/16/2018 0900 Gross per 24 hour  Intake 2329.56 ml  Output 1400 ml  Net 929.56 ml   Filed Weights   09/12/18 1558  Weight: 63 kg     Examination:  General exam: Looks much better this morning.  No acute distress.  Awake and answering questions better Respiratory system: Bilateral decreased breath sounds at bases, scattered crackles.  No wheezing Cardiovascular system: S1 & S2 heard, rate controlled gastrointestinal system: Abdomen is nondistended, soft and nontender. Normal bowel sounds heard. Extremities: No cyanosis, clubbing, edema   Data Reviewed: I have personally reviewed following labs and imaging studies  CBC: Recent Labs  Lab 09/12/18 1500 09/13/18 0258 09/14/18 0346 09/15/18 0348 09/16/18 0635  WBC 16.2* 10.8* 14.7* 10.2 8.3  NEUTROABS 13.4*  --   --   --  6.5  HGB 15.3 13.6 14.1 14.3 11.2*  HCT 44.7 40.7 41.2 42.3 31.8*  MCV 90.9 91.9 90.9 92.2 88.6  PLT 170 120* 141* 146* 867   Basic Metabolic Panel: Recent Labs  Lab 09/13/18 2035 09/14/18 0033 09/14/18 0346 09/15/18 0348 09/16/18 0635  NA 121* 125* 124* 131* 131*  K 4.9 4.7 4.2 4.1 3.7  CL 98 104 102 110 114*  CO2 10* 9* 11* 12* 11*  GLUCOSE 119* 104* 108* 94 90  BUN 50* 49* 47* 39* 28*  CREATININE 1.89* 1.69* 1.60* 1.48* 1.23  CALCIUM 7.7* 8.0* 7.9* 7.7* 7.2*  MG  --   --   --   --  1.8   GFR: Estimated Creatinine Clearance: 50.6 mL/min (by C-G formula based on SCr of 1.23 mg/dL). Liver Function Tests: Recent Labs  Lab 09/12/18 1500 09/16/18 0635  AST 94* 55*  ALT 37 24  ALKPHOS 169* 131*  BILITOT 1.2 0.7  PROT 8.2* 4.8*  ALBUMIN 2.9* 1.6*   Recent Labs  Lab 09/12/18 1500  LIPASE 64*   Recent Labs  Lab 09/12/18 1500 09/16/18 0635  AMMONIA 22 42*   Coagulation Profile: Recent Labs  Lab 09/12/18 1500  INR 1.15   Cardiac Enzymes: No results for input(s): CKTOTAL, CKMB, CKMBINDEX, TROPONINI in the last 168 hours. BNP (last 3 results) No results for input(s): PROBNP in the last 8760 hours. HbA1C: No results for input(s): HGBA1C in the last 72 hours. CBG: No results for input(s): GLUCAP in the last 168  hours. Lipid Profile: No results for input(s): CHOL, HDL, LDLCALC, TRIG, CHOLHDL, LDLDIRECT in the last 72 hours. Thyroid Function Tests: No results for input(s): TSH, T4TOTAL, FREET4, T3FREE, THYROIDAB in the last 72 hours. Anemia Panel: No results for input(s): VITAMINB12, FOLATE, FERRITIN, TIBC, IRON, RETICCTPCT in the last 72 hours. Sepsis Labs: Recent Labs  Lab 09/12/18 1500 09/12/18 1622 09/12/18 2000  PROCALCITON  --   --  34.67  LATICACIDVEN 2.7* 2.7* 2.2*    Recent Results (from the past 240 hour(s))  Culture, blood (Routine x 2)     Status: None (Preliminary result)   Collection Time: 09/12/18  3:00 PM  Result Value Ref Range Status   Specimen Description   Final    BLOOD RIGHT ANTECUBITAL Performed at [facility] [address], [place], [place] [address]    Special Requests   Final    BOTTLES DRAWN AEROBIC AND ANAEROBIC Blood Culture adequate volume Performed at [facility] [address] [place],  [place] [address]    Culture   Final    NO GROWTH 3 DAYS Performed at [facility], [place] [address], [place], [place] [address]    Report Status PENDING  Incomplete  Culture, blood (Routine x 2)     Status: None (Preliminary result)   Collection Time: 09/12/18  3:00 PM  Result Value Ref Range Status   Specimen Description   Final    BLOOD LEFT ARM Performed at [facility] [address], [place], [place] [address]    Special Requests   Final    BOTTLES DRAWN AEROBIC AND ANAEROBIC Blood Culture adequate volume Performed at [facility] [address] [place], [place] [address]    Culture   Final    NO GROWTH 3 DAYS Performed at [facility], [place] [address] [place], [place] [address]    Report Status PENDING  Incomplete  Urine culture     Status: None   Collection Time: 09/12/18  6:28 PM  Result Value Ref Range Status   Specimen Description   Final    URINE, CLEAN  CATCH Performed at [facility], [place] [address] [place], [place] [address]    Special Requests   Final    NONE Performed at [facility], [place] [address] [place], [place] [address]    Culture   Final    NO GROWTH Performed at [facility], [place] [address], [place], [place] [address]    Report Status 09/14/2018 FINAL  Final  Respiratory Panel by PCR     Status: None   Collection Time: 09/12/18  8:15 PM  Result Value Ref Range Status   Adenovirus NOT DETECTED NOT DETECTED Final   Coronavirus 229E NOT DETECTED NOT DETECTED Final    Comment: (NOTE) The Coronavirus on the Respiratory Panel, DOES NOT test for the novel  Coronavirus (2019 nCoV)    Coronavirus HKU1 NOT DETECTED NOT DETECTED Final   Coronavirus NL63 NOT DETECTED NOT DETECTED Final   Coronavirus OC43 NOT DETECTED NOT DETECTED Final   Metapneumovirus NOT DETECTED NOT DETECTED Final   Rhinovirus / Enterovirus NOT DETECTED NOT DETECTED Final   Influenza A NOT DETECTED NOT DETECTED Final   Influenza B NOT DETECTED NOT DETECTED Final   Parainfluenza Virus 1 NOT DETECTED NOT DETECTED Final   Parainfluenza Virus 2 NOT DETECTED NOT DETECTED Final   Parainfluenza Virus 3 NOT DETECTED NOT DETECTED Final   Parainfluenza Virus 4 NOT DETECTED NOT DETECTED Final   Respiratory Syncytial Virus NOT DETECTED NOT DETECTED Final   Bordetella pertussis NOT DETECTED NOT DETECTED Final   Chlamydophila pneumoniae NOT DETECTED NOT DETECTED Final   Mycoplasma pneumoniae NOT DETECTED NOT DETECTED Final    Comment: Performed at [facility], [place]. [address], [place], [place] [address]  MRSA PCR Screening     Status: None   Collection Time: 09/12/18  8:15 PM  Result Value Ref Range Status   MRSA by PCR NEGATIVE NEGATIVE Final    Comment:        The GeneXpert MRSA Assay (FDA approved for NASAL specimens only), is one component of a comprehensive MRSA colonization surveillance program. It is  not intended to diagnose MRSA infection nor to guide or monitor treatment for MRSA infections. Performed at [facility], [place] [address], [place],  [address]   Gastrointestinal Panel by PCR , Stool     Status: None   Collection  Time: 09/14/18  8:39 AM  Result Value Ref Range Status   Campylobacter species NOT DETECTED NOT DETECTED Final   Plesimonas shigelloides NOT DETECTED NOT DETECTED Final   Salmonella species NOT DETECTED NOT DETECTED Final   Yersinia enterocolitica NOT DETECTED NOT DETECTED Final   Vibrio species NOT DETECTED NOT DETECTED Final   Vibrio cholerae NOT DETECTED NOT DETECTED Final   Enteroaggregative E coli (EAEC) NOT DETECTED NOT DETECTED Final   Enteropathogenic E coli (EPEC) NOT DETECTED NOT DETECTED Final   Enterotoxigenic E coli (ETEC) NOT DETECTED NOT DETECTED Final   Shiga like toxin producing E coli (STEC) NOT DETECTED NOT DETECTED Final   Shigella/Enteroinvasive E coli (EIEC) NOT DETECTED NOT DETECTED Final   Cryptosporidium NOT DETECTED NOT DETECTED Final   Cyclospora cayetanensis NOT DETECTED NOT DETECTED Final   Entamoeba histolytica NOT DETECTED NOT DETECTED Final   Giardia lamblia NOT DETECTED NOT DETECTED Final   Adenovirus F40/41 NOT DETECTED NOT DETECTED Final   Astrovirus NOT DETECTED NOT DETECTED Final   Norovirus GI/GII NOT DETECTED NOT DETECTED Final   Rotavirus A NOT DETECTED NOT DETECTED Final   Sapovirus (I, II, IV, and V) NOT DETECTED NOT DETECTED Final    Comment: Performed at [facility], [address] [place], [place] [address]         Radiology Studies: Ct Head Wo Contrast  Result Date: 09/14/2018 CLINICAL DATA:  Encephalopathy. History of metastatic renal cell carcinoma, cirrhosis, hypertension. EXAM: CT HEAD WITHOUT CONTRAST TECHNIQUE: Contiguous axial images were obtained from the base of the skull through the vertex without intravenous contrast. COMPARISON:  None. FINDINGS: BRAIN: No  intraparenchymal hemorrhage, mass effect nor midline shift. No parenchymal brain volume loss for age. No hydrocephalus. Minimal supratentorial white matter hypodensities within normal range for patient's age, though non-specific are most compatible with chronic small vessel ischemic disease. No acute large vascular territory infarcts. No abnormal extra-axial fluid collections. Basal cisterns are patent. VASCULAR: Mild calcific atherosclerosis of the carotid siphons. SKULL: No skull fracture. No significant scalp soft tissue swelling. SINUSES/ORBITS: Hypoplastic frontal sinuses. Mild sphenoid ethmoidal mucosal thickening. Mastoid air cells are well aerated. Included ocular globes and orbital contents are non-suspicious. OTHER: None. IMPRESSION: Negative CT HEAD without contrast for age. Electronically Signed   By: Elon Alas M.D.   On: 09/14/2018 16:05   Mr Jeri Cos PX Contrast  Result Date: 09/15/2018 CLINICAL DATA:  Altered mental status. EXAM: MRI HEAD WITHOUT AND WITH CONTRAST TECHNIQUE: Multiplanar, multiecho pulse sequences of the brain and surrounding structures were obtained without and with intravenous contrast. CONTRAST:  615 mL Gadavist COMPARISON:  CT head without contrast 09/14/2018 FINDINGS: Brain: Mild atrophy and diffuse white matter disease is present. White matter changes extend into the brainstem. No acute infarct, hemorrhage, or mass lesion is present. The ventricles are of proportionate to the degree of atrophy. No significant extraaxial fluid collection is present. Vascular: Flow is present in the major intracranial arteries. Skull and upper cervical spine: The craniocervical junction is normal. Upper cervical spine is within normal limits. Marrow signal is unremarkable. Sinuses/Orbits: There is scattered opacification of anterior ethmoid air cells, left greater than right. Mild mucosal thickening is present along the floor of the maxillary sinuses. No fluid levels are present. There  is mucosal thickening in the sphenoid sinuses bilaterally. Globes and orbits are within normal limits. IMPRESSION: 1. No acute intracranial abnormality to explain altered mental status. 2. Mild atrophy and white matter disease. This likely reflects the sequela of  chronic microvascular ischemia. 3. Mild diffuse sinus disease. No fluid levels to suggest acute sinusitis. Electronically Signed   By: San Morelle M.D.   On: 09/15/2018 12:03        Scheduled Meds: . amLODipine  10 mg Oral Daily  . cholestyramine light  4 g Oral BID  . enoxaparin (LOVENOX) injection  40 mg Subcutaneous Daily  . levothyroxine  150 mcg Oral Q0600   Continuous Infusions: . ceFEPime (MAXIPIME) IV 1 g (09/16/18 0512)  .  sodium bicarbonate  infusion 1000 mL 75 mL/hr at 09/16/18 0834     LOS: 4 days        Aline August, MD [facility] 09/16/2018, 10:04 AM

## 2018-09-16 NOTE — Care Management Important Message (Signed)
Important Message  Patient Details  Name: ALF DOYLE MRN: [id number] Date of Birth: 09-Feb-1951   Medicare Important Message Given:  Yes    Kerin Salen 09/16/2018, 12:14 PMImportant Message  Patient Details  Name: ALECK LOCKLIN MRN: [id number] Date of Birth: 14-Mar-1951   Medicare Important Message Given:  Yes    Kerin Salen 09/16/2018, 12:14 PM

## 2018-09-17 DIAGNOSIS — J189 Pneumonia, unspecified organism: Secondary | ICD-10-CM

## 2018-09-17 LAB — CBC WITH DIFFERENTIAL/PLATELET
Abs Immature Granulocytes: 0.41 10*3/uL — ABNORMAL HIGH (ref 0.00–0.07)
Basophils Absolute: 0 10*3/uL (ref 0.0–0.1)
Basophils Relative: 0 %
EOS ABS: 0.1 10*3/uL (ref 0.0–0.5)
Eosinophils Relative: 1 %
HCT: 32.6 % — ABNORMAL LOW (ref 39.0–52.0)
Hemoglobin: 11.1 g/dL — ABNORMAL LOW (ref 13.0–17.0)
Immature Granulocytes: 6 %
Lymphocytes Relative: 10 %
Lymphs Abs: 0.8 10*3/uL (ref 0.7–4.0)
MCH: 30.5 pg (ref 26.0–34.0)
MCHC: 34 g/dL (ref 30.0–36.0)
MCV: 89.6 fL (ref 80.0–100.0)
Monocytes Absolute: 0.9 10*3/uL (ref 0.1–1.0)
Monocytes Relative: 12 %
NEUTROS PCT: 71 %
Neutro Abs: 5.3 10*3/uL (ref 1.7–7.7)
Platelets: 165 10*3/uL (ref 150–400)
RBC: 3.64 MIL/uL — AB (ref 4.22–5.81)
RDW: 15.3 % (ref 11.5–15.5)
WBC: 7.4 10*3/uL (ref 4.0–10.5)
nRBC: 0.4 % — ABNORMAL HIGH (ref 0.0–0.2)

## 2018-09-17 LAB — CULTURE, BLOOD (ROUTINE X 2)
Culture: NO GROWTH
Culture: NO GROWTH
Special Requests: ADEQUATE
Special Requests: ADEQUATE

## 2018-09-17 LAB — BASIC METABOLIC PANEL
Anion gap: 6 (ref 5–15)
BUN: 24 mg/dL — ABNORMAL HIGH (ref 8–23)
CALCIUM: 7.4 mg/dL — AB (ref 8.9–10.3)
CO2: 15 mmol/L — ABNORMAL LOW (ref 22–32)
CREATININE: 1.13 mg/dL (ref 0.61–1.24)
Chloride: 111 mmol/L (ref 98–111)
GFR calc Af Amer: >60 mL/min (ref >60)
GFR calc non Af Amer: >60 mL/min (ref >60)
Glucose, Bld: 93 mg/dL (ref 70–99)
Potassium: 3.5 mmol/L (ref 3.5–5.1)
Sodium: 132 mmol/L — ABNORMAL LOW (ref 135–145)

## 2018-09-17 LAB — FOLATE RBC
Folate, Hemolysate: 404 ng/mL
Folate, RBC: 1263 ng/mL (ref >498)
Hematocrit: 32 % — ABNORMAL LOW (ref 37.5–51.0)

## 2018-09-17 LAB — MAGNESIUM: Magnesium: 1.9 mg/dL (ref 1.7–2.4)

## 2018-09-17 LAB — T3, FREE: T3, Free: 0.6 pg/mL — ABNORMAL LOW (ref 2.0–4.4)

## 2018-09-17 NOTE — Progress Notes (Signed)
Occupational Therapy Treatment Patient Details Name: Stephen Grimes MRN: [id number] DOB: July 14, 1951 Today's Date: 09/17/2018    History of present illness Stephen Grimes is a 68 y.o. male with a past medical history of metastatic renal cell carcinoma, alcoholic liver cirrhosis, essential hypertension, hypothyroidism admitted with severe hyponatremia, acute renal failure and HCAP.   OT comments  Pt is making good progress. Walked to bathroom and performed shower transfer at min guard level. Talked to pt/wife about getting shower chair initially vs sponge bathing for safety as activity requires a lot of energy.    Follow Up Recommendations  Home health OT;Supervision/Assistance - 24 hour    Equipment Recommendations  None recommended by OT    Recommendations for Other Services      Precautions / Restrictions Precautions Precautions: Fall Restrictions Weight Bearing Restrictions: No       Mobility Bed Mobility                  Transfers   Equipment used: Rolling walker (2 wheeled)   Sit to Stand: Min guard         General transfer comment: for safety    Balance                                           ADL either performed or assessed with clinical judgement   ADL                           Toilet Transfer: Min guard;Ambulation(chair)       Tub/ Shower Transfer: Gaffer;Ambulation;Min guard;Grab bars;Rolling walker     General ADL Comments: backed into shower stall using RW for support and transitioning to grab bar. Pt used comfort height commode earlier with nursing and has this with a grab bar at home. ADL was performed.  Pt is agreeable to theraband:  retested strength, grossly3+/5.  Based on clinical judgment, pt needs min A for most LB adl activities (mod for dressing)     Vision       Perception     Praxis      Cognition Arousal/Alertness: Awake/alert Behavior During Therapy: WFL for tasks  assessed/performed                                   General Comments: multimodal cues needed        Exercises     Shoulder Instructions       General Comments      Pertinent Vitals/ Pain       Pain Assessment: No/denies pain  Home Living                                          Prior Functioning/Environment              Frequency  Min 2X/week        Progress Toward Goals  OT Goals(current goals can now be found in the care plan section)  Progress towards OT goals: Progressing toward goals     Plan      Co-evaluation                 AM-PAC  OT "6 Clicks" Daily Activity     Outcome Measure   Help from another person eating meals?: None Help from another person taking care of personal grooming?: A Little Help from another person toileting, which includes using toliet, bedpan, or urinal?: A Little Help from another person bathing (including washing, rinsing, drying)?: A Little Help from another person to put on and taking off regular upper body clothing?: A Little Help from another person to put on and taking off regular lower body clothing?: A Lot 6 Click Score: 18    End of Session    OT Visit Diagnosis: Muscle weakness (generalized) (M62.81)   Activity Tolerance Patient tolerated treatment well   Patient Left in chair;with call bell/phone within reach;with family/visitor present   Nurse Communication          Time: 1761-6073 OT Time Calculation (min): 10 min  Charges: OT General Charges $OT Visit: 1 Visit OT Treatments $Self Care/Home Management : 8-22 mins  Lesle Chris, OTR/L Acute Rehabilitation Services [phone] [facility] pager [phone] office 09/17/2018   [place] 09/17/2018, 12:42 PM

## 2018-09-17 NOTE — Progress Notes (Signed)
PROGRESS NOTE    Stephen Grimes  XAJ:[id number] DOB: 1950/08/19 DOA: 09/12/2018 PCP: Seward Carol, MD   Brief Narrative: 68 year old male with history of metastatic renal carcinoma, alcoholic liver cirrhosis, essential hypertension, hypothyroidism who was brought in by family members on 09/12/2022 poor oral intake the last few days.  She was found to have sodium of 118 on presentation with creatinine of 3.36.  Ammonia was normal.  White count of 16.2.  Chest x-ray raise concern for left lung consolidation.  He was admitted for pneumonia as well as hyponatremia  Assessment & Plan:   Principal Problem:   HCAP (healthcare-associated pneumonia) Active Problems:   AKI (acute kidney injury) ([place])   Alcoholic cirrhosis of liver without ascites ([facility])   Hypothyroidism, unspecified   Malignant neoplasm of unspecified kidney, except renal pelvis ([place])   Hyponatremia   Acute metabolic encephalopathy   Sepsis ([place])   Sepsis secondary to pneumonia -Cultures negative so far.  Hemodynamically improving.  Procalcitonin was 34.67 on admission.   -Influenza PCR negative.  Respiratory viral panel negative.  Continue cefepime.  Today is day 6.  Out of bed ambulate check pulse oximetry while ambulating.  Repeat chest x-ray 2/25 consistent with infiltrates.  Hypovolemic hyponatremia with metabolic acidosis -Presented with sodium of 118.  Treated with IV fluids.  132 today.  Acidosis improving with starting bicarb drip.  Continue bicarb drip recheck renal functions tomorrow.  Nausea, vomiting, diarrhea  -Improving.  Questionable cause.  GI PCR negative. -Diarrhea improving. Oral intake is improving as well.  Acute metabolic encephalopathy improving -Most likely from pneumonia and hyponatremia. -CT of the brain was negative for any acute abnormality.  Patient was supposed to have an MRI of the brain with/without contrast as an outpatient this week as per [facility].   - MRI brain was negative for any acute  abnormalities.  Fall precautions.  -PT OT recommend home health PT/OT  Essential hypertension -Blood pressure stable.  Continue amlodipine.  Losartan on hold  Acute kidney injury on chronic kidney disease stage III -Creatinine improving.  Monitor creatinine.  Creatinine 1.13 today.  History of alcoholic cirrhosis - Not on any diuretics currently.  Outpatient follow-up  Hypothyroidism -TSH was elevated at 16, free T4 low -Levothyroxine dose was increased from 09/15/2018 onwards.  Continue  Metastatic renal cancer -Follows at [facility].  Continue outpatient follow-up  Generalized deconditioning -PT recommend home health PT/OT.  Care management consult.  Estimated body mass index is 21.77 kg/m as calculated from the following:   Height as of this encounter: 5\' 7"  (1.702 m).   Weight as of this encounter: 63 kg.  DVT prophylaxis: Lovenox  code Status: Full code Family Communication: Discussed with wife disposition Plan: Pending improvement in renal parameters possible discharge home tomorrow.  Consultants:   None  Procedures: None Antimicrobials: Cefepime  Subjective: Patient resting in bed continues to complain of cough and shortness of breath  Objective: Elderly male appears older than stated age frail chronically ill looking Vitals:   09/16/18 0929 09/16/18 1700 09/16/18 2000 09/17/18 0614  BP: 121/72 137/84 133/77 127/73  Pulse: 81 89 90 79  Resp: (!) 22 (!) 26 (!) 24 16  Temp: 98.8 F (37.1 C) 99.7 F (37.6 C) 99.5 F (37.5 C) 99.3 F (37.4 C)  TempSrc: Oral Oral Oral Oral  SpO2: 95% 99% 98% 97%  Weight:      Height:        Intake/Output Summary (Last 24 hours) at 09/17/2018 0947 Last data filed at  09/17/2018 0700 Gross per 24 hour  Intake 975 ml  Output 900 ml  Net 75 ml   Filed Weights   09/12/18 1558  Weight: 63 kg    Examination:  General exam: Appears calm and comfortable  Respiratory system scattered rhonchi to auscultation. Respiratory  effort normal. Cardiovascular system: S1 & S2 heard, RRR. No JVD, murmurs, rubs, gallops or clicks. No pedal edema. Gastrointestinal system: Abdomen is nondistended, soft and nontender. No organomegaly or masses felt. Normal bowel sounds heard. Central nervous system: Alert and oriented. No focal neurological deficits. Extremities: Symmetric 5 x 5 power. Skin: No rashes, lesions or ulcers Psychiatry: Judgement and insight appear normal. Mood & affect appropriate.     Data Reviewed: I have personally reviewed following labs and imaging studies  CBC: Recent Labs  Lab 09/12/18 1500 09/13/18 0258 09/14/18 0346 09/15/18 0348 09/16/18 0635 09/17/18 0628  WBC 16.2* 10.8* 14.7* 10.2 8.3 7.4  NEUTROABS 13.4*  --   --   --  6.5 5.3  HGB 15.3 13.6 14.1 14.3 11.2* 11.1*  HCT 44.7 40.7 41.2 42.3 31.8* 32.6*  MCV 90.9 91.9 90.9 92.2 88.6 89.6  PLT 170 120* 141* 146* 153 425   Basic Metabolic Panel: Recent Labs  Lab 09/14/18 0033 09/14/18 0346 09/15/18 0348 09/16/18 0635 09/17/18 0628  NA 125* 124* 131* 131* 132*  K 4.7 4.2 4.1 3.7 3.5  CL 104 102 110 114* 111  CO2 9* 11* 12* 11* 15*  GLUCOSE 104* 108* 94 90 93  BUN 49* 47* 39* 28* 24*  CREATININE 1.69* 1.60* 1.48* 1.23 1.13  CALCIUM 8.0* 7.9* 7.7* 7.2* 7.4*  MG  --   --   --  1.8 1.9   GFR: Estimated Creatinine Clearance: 55.1 mL/min (by C-G formula based on SCr of 1.13 mg/dL). Liver Function Tests: Recent Labs  Lab 09/12/18 1500 09/16/18 0635  AST 94* 55*  ALT 37 24  ALKPHOS 169* 131*  BILITOT 1.2 0.7  PROT 8.2* 4.8*  ALBUMIN 2.9* 1.6*   Recent Labs  Lab 09/12/18 1500  LIPASE 64*   Recent Labs  Lab 09/12/18 1500 09/16/18 0635  AMMONIA 22 42*   Coagulation Profile: Recent Labs  Lab 09/12/18 1500  INR 1.15   Cardiac Enzymes: No results for input(s): CKTOTAL, CKMB, CKMBINDEX, TROPONINI in the last 168 hours. BNP (last 3 results) No results for input(s): PROBNP in the last 8760 hours. HbA1C: No results  for input(s): HGBA1C in the last 72 hours. CBG: No results for input(s): GLUCAP in the last 168 hours. Lipid Profile: No results for input(s): CHOL, HDL, LDLCALC, TRIG, CHOLHDL, LDLDIRECT in the last 72 hours. Thyroid Function Tests: Recent Labs    09/16/18 0635  T3FREE 0.6*   Anemia Panel: No results for input(s): VITAMINB12, FOLATE, FERRITIN, TIBC, IRON, RETICCTPCT in the last 72 hours. Sepsis Labs: Recent Labs  Lab 09/12/18 1500 09/12/18 1622 09/12/18 2000  PROCALCITON  --   --  34.67  LATICACIDVEN 2.7* 2.7* 2.2*    Recent Results (from the past 240 hour(s))  Culture, blood (Routine x 2)     Status: None (Preliminary result)   Collection Time: 09/12/18  3:00 PM  Result Value Ref Range Status   Specimen Description   Final    BLOOD RIGHT ANTECUBITAL Performed at [facility] [address] [place], [place] [address]    Special Requests   Final    BOTTLES DRAWN AEROBIC AND ANAEROBIC Blood Culture adequate volume Performed at [facility], [place] [address], [place], [place] [address]    Culture   Final    NO GROWTH 4 DAYS Performed at [facility], [place] [address], [place], [place] [address]    Report Status PENDING  Incomplete  Culture, blood (Routine x 2)     Status: None (Preliminary result)   Collection Time: 09/12/18  3:00 PM  Result Value Ref Range Status   Specimen Description   Final    BLOOD LEFT ARM Performed at [facility] [address], [place], [place] [address]    Special Requests   Final    BOTTLES DRAWN AEROBIC AND ANAEROBIC Blood Culture adequate volume Performed at [facility] [address], [place], [place] [address]    Culture   Final    NO GROWTH 4 DAYS Performed at [facility], [place] [address] [place], [place] [address]    Report Status PENDING  Incomplete  Urine culture     Status: None   Collection Time: 09/12/18  6:28 PM  Result Value Ref  Range Status   Specimen Description   Final    URINE, CLEAN CATCH Performed at [facility], [place] [address], [place], [place] [address]    Special Requests   Final    NONE Performed at [facility], [place] [address], [place], [place] [address]    Culture   Final    NO GROWTH Performed at [facility], [place] [address], [place], [place] [address]    Report Status 09/14/2018 FINAL  Final  Respiratory Panel by PCR     Status: None   Collection Time: 09/12/18  8:15 PM  Result Value Ref Range Status   Adenovirus NOT DETECTED NOT DETECTED Final   Coronavirus 229E NOT DETECTED NOT DETECTED Final    Comment: (NOTE) The Coronavirus on the Respiratory Panel, DOES NOT test for the novel  Coronavirus (2019 nCoV)    Coronavirus HKU1 NOT DETECTED NOT DETECTED Final   Coronavirus NL63 NOT DETECTED NOT DETECTED Final   Coronavirus OC43 NOT DETECTED NOT DETECTED Final   Metapneumovirus NOT DETECTED NOT DETECTED Final   Rhinovirus / Enterovirus NOT DETECTED NOT DETECTED Final   Influenza A NOT DETECTED NOT DETECTED Final   Influenza B NOT DETECTED NOT DETECTED Final   Parainfluenza Virus 1 NOT DETECTED NOT DETECTED Final   Parainfluenza Virus 2 NOT DETECTED NOT DETECTED Final   Parainfluenza Virus 3 NOT DETECTED NOT DETECTED Final   Parainfluenza Virus 4 NOT DETECTED NOT DETECTED Final   Respiratory Syncytial Virus NOT DETECTED NOT DETECTED Final   Bordetella pertussis NOT DETECTED NOT DETECTED Final   Chlamydophila pneumoniae NOT DETECTED NOT DETECTED Final   Mycoplasma pneumoniae NOT DETECTED NOT DETECTED Final    Comment: Performed at [facility], [place]. [address], [place], [place] [address]  MRSA PCR Screening     Status: None   Collection Time: 09/12/18  8:15 PM  Result Value Ref Range Status   MRSA by PCR NEGATIVE NEGATIVE Final    Comment:        The GeneXpert MRSA Assay (FDA approved for NASAL specimens only), is one component of  a comprehensive MRSA colonization surveillance program. It is not intended to diagnose MRSA infection nor to guide or monitor treatment for MRSA infections. Performed at [facility], [place] [address], [place],  [address]   Gastrointestinal Panel by PCR , Stool  Status: None   Collection Time: 09/14/18  8:39 AM  Result Value Ref Range Status   Campylobacter species NOT DETECTED NOT DETECTED Final   Plesimonas shigelloides NOT DETECTED NOT DETECTED Final   Salmonella species NOT DETECTED NOT DETECTED Final   Yersinia enterocolitica NOT DETECTED NOT DETECTED Final   Vibrio species NOT DETECTED NOT DETECTED Final   Vibrio cholerae NOT DETECTED NOT DETECTED Final   Enteroaggregative E coli (EAEC) NOT DETECTED NOT DETECTED Final   Enteropathogenic E coli (EPEC) NOT DETECTED NOT DETECTED Final   Enterotoxigenic E coli (ETEC) NOT DETECTED NOT DETECTED Final   Shiga like toxin producing E coli (STEC) NOT DETECTED NOT DETECTED Final   Shigella/Enteroinvasive E coli (EIEC) NOT DETECTED NOT DETECTED Final   Cryptosporidium NOT DETECTED NOT DETECTED Final   Cyclospora cayetanensis NOT DETECTED NOT DETECTED Final   Entamoeba histolytica NOT DETECTED NOT DETECTED Final   Giardia lamblia NOT DETECTED NOT DETECTED Final   Adenovirus F40/41 NOT DETECTED NOT DETECTED Final   Astrovirus NOT DETECTED NOT DETECTED Final   Norovirus GI/GII NOT DETECTED NOT DETECTED Final   Rotavirus A NOT DETECTED NOT DETECTED Final   Sapovirus (I, II, IV, and V) NOT DETECTED NOT DETECTED Final    Comment: Performed at [facility], [address], [place], [place] [address]         Radiology Studies: Dg Chest 2 View  Result Date: 09/16/2018 CLINICAL DATA:  Dyspnea. EXAM: CHEST - 2 VIEW COMPARISON:  Radiographs of September 12, 2018. FINDINGS: The heart size and mediastinal contours are within normal limits. No pneumothorax is noted. Small bilateral pleural effusions are noted.  Mild ill-defined left upper lobe airspace opacity is noted concerning for possible pneumonia. Right lung is unremarkable. The visualized skeletal structures are unremarkable. IMPRESSION: Small bilateral pleural effusions. Left upper lobe airspace opacity is again noted concerning for pneumonia. Electronically Signed   By: Marijo Conception, M.D.   On: 09/16/2018 13:23   Mr Jeri Cos TO Contrast  Result Date: 09/15/2018 CLINICAL DATA:  Altered mental status. EXAM: MRI HEAD WITHOUT AND WITH CONTRAST TECHNIQUE: Multiplanar, multiecho pulse sequences of the brain and surrounding structures were obtained without and with intravenous contrast. CONTRAST:  615 mL Gadavist COMPARISON:  CT head without contrast 09/14/2018 FINDINGS: Brain: Mild atrophy and diffuse white matter disease is present. White matter changes extend into the brainstem. No acute infarct, hemorrhage, or mass lesion is present. The ventricles are of proportionate to the degree of atrophy. No significant extraaxial fluid collection is present. Vascular: Flow is present in the major intracranial arteries. Skull and upper cervical spine: The craniocervical junction is normal. Upper cervical spine is within normal limits. Marrow signal is unremarkable. Sinuses/Orbits: There is scattered opacification of anterior ethmoid air cells, left greater than right. Mild mucosal thickening is present along the floor of the maxillary sinuses. No fluid levels are present. There is mucosal thickening in the sphenoid sinuses bilaterally. Globes and orbits are within normal limits. IMPRESSION: 1. No acute intracranial abnormality to explain altered mental status. 2. Mild atrophy and white matter disease. This likely reflects the sequela of chronic microvascular ischemia. 3. Mild diffuse sinus disease. No fluid levels to suggest acute sinusitis. Electronically Signed   By: San Morelle M.D.   On: 09/15/2018 12:03        Scheduled Meds: . amLODipine  10 mg Oral  Daily  . cholestyramine light  4 g Oral BID  . enoxaparin (LOVENOX) injection  40 mg Subcutaneous  Daily  . levothyroxine  150 mcg Oral Q0600   Continuous Infusions: . ceFEPime (MAXIPIME) IV 1 g (09/17/18 0616)  .  sodium bicarbonate  infusion 1000 mL 75 mL/hr at 09/17/18 8850     LOS: 5 days      Georgette Shell, MD [facility]  If 7PM-7AM, please contact night-coverage [email or web address] Password [email or web address] 09/17/2018, 8:44 AM

## 2018-09-17 NOTE — Progress Notes (Signed)
   09/17/18 1247  OT Visit Information  Last OT Received On 09/17/18  Assistance Needed +1  History of Present Illness Stephen Grimes is a 68 y.o. male with a past medical history of metastatic renal cell carcinoma, alcoholic liver cirrhosis, essential hypertension, hypothyroidism admitted with severe hyponatremia, acute renal failure and HCAP.  Precautions  Precautions Fall  Pain Assessment  Pain Assessment No/denies pain  Cognition  Arousal/Alertness Awake/alert  Behavior During Therapy WFL for tasks assessed/performed  General Comments followed HEP with min cues  Upper Extremity Assessment  Upper Extremity Assessment Generalized weakness (3+/5)  Exercises  Exercises Other exercises  Other Exercises  Other Exercises level one theraband for FF, biceps and horizontal ABD.  Written HEP given. Pt performed 2 sets of 10 with each arm with min cues  OT - End of Session  Activity Tolerance Patient tolerated treatment well  Patient left in chair;with call bell/phone within reach;with family/visitor present  OT Assessment/Plan  OT Visit Diagnosis Muscle weakness (generalized) (M62.81)  OT Frequency (ACUTE ONLY) Min 2X/week  Follow Up Recommendations Home health OT;Supervision/Assistance - 24 hour  OT Equipment None recommended by OT (may want to consider seat for shower)  AM-PAC OT "6 Clicks" Daily Activity Outcome Measure (Version 2)  Help from another person eating meals? 4  Help from another person taking care of personal grooming? 3  Help from another person toileting, which includes using toliet, bedpan, or urinal? 3  Help from another person bathing (including washing, rinsing, drying)? 3  Help from another person to put on and taking off regular upper body clothing? 3  Help from another person to put on and taking off regular lower body clothing? 2  6 Click Score 18  OT Goal Progression  Progress towards OT goals Progressing toward goals  OT Time Calculation  OT Start Time  (ACUTE ONLY) 1035  OT Stop Time (ACUTE ONLY) 1049  OT Time Calculation (min) 14 min  OT General Charges  $OT Visit 1 Visit  OT Treatments  $Therapeutic Exercise 8-22 mins  Lesle Chris, OTR/L Acute Rehabilitation Services [phone] [facility] pager [phone] office 09/17/2018

## 2018-09-17 NOTE — Progress Notes (Signed)
PT Cancellation Note  Patient Details Name: Stephen Grimes MRN: [id number] DOB: [birth year]   Cancelled Treatment:    Reason Eval/Treat Not Completed: Fatigue/lethargy limiting ability to participate, patient reports just getting settled. Has had OT, ambulated in hall. PT will check back in AM.   Claretha Cooper 09/17/2018, 2:44 PM  Tresa Endo PT Acute Rehabilitation Services Pager [phone] Office [phone]

## 2018-09-17 NOTE — Progress Notes (Signed)
SATURATION QUALIFICATIONS: (This note is used to comply with regulatory documentation for home oxygen)  Patient Saturations on Room Air at Rest = 100%  Patient Saturations on Room Air while Ambulating = 98%  Patient Saturations on N/A Liters of oxygen while Ambulating = N/A%  Please briefly explain why patient needs home oxygen: Pt does not qualify for home O2 at this time.

## 2018-09-18 LAB — CBC
HCT: 30.1 % — ABNORMAL LOW (ref 39.0–52.0)
Hemoglobin: 10.3 g/dL — ABNORMAL LOW (ref 13.0–17.0)
MCH: 30.9 pg (ref 26.0–34.0)
MCHC: 34.2 g/dL (ref 30.0–36.0)
MCV: 90.4 fL (ref 80.0–100.0)
Platelets: 143 10*3/uL — ABNORMAL LOW (ref 150–400)
RBC: 3.33 MIL/uL — ABNORMAL LOW (ref 4.22–5.81)
RDW: 15.5 % (ref 11.5–15.5)
WBC: 8.6 10*3/uL (ref 4.0–10.5)
nRBC: 0.2 % (ref 0.0–0.2)

## 2018-09-18 LAB — BASIC METABOLIC PANEL
Anion gap: 8 (ref 5–15)
BUN: 20 mg/dL (ref 8–23)
CO2: 19 mmol/L — ABNORMAL LOW (ref 22–32)
CREATININE: 1.14 mg/dL (ref 0.61–1.24)
Calcium: 7 mg/dL — ABNORMAL LOW (ref 8.9–10.3)
Chloride: 106 mmol/L (ref 98–111)
GFR calc Af Amer: >60 mL/min (ref >60)
Glucose, Bld: 102 mg/dL — ABNORMAL HIGH (ref 70–99)
Potassium: 3.4 mmol/L — ABNORMAL LOW (ref 3.5–5.1)
Sodium: 133 mmol/L — ABNORMAL LOW (ref 135–145)

## 2018-09-18 MED ORDER — CHLORPROMAZINE HCL 25 MG PO TABS: 25.0000 mg | ORAL_TABLET | Freq: Three times a day (TID) | ORAL | 0 refills | Status: DC | PRN

## 2018-09-18 MED ORDER — ALUM & MAG HYDROXIDE-SIMETH 200-200-20 MG/5ML PO SUSP
30.0000 mL | Freq: Four times a day (QID) | ORAL | Status: DC | PRN
  Administered 2018-09-18: 30 mL via ORAL
  Filled 2018-09-18: qty 30

## 2018-09-18 MED ORDER — AMLODIPINE BESYLATE 2.5 MG PO TABS: 2.5000 mg | ORAL_TABLET | Freq: Every day | ORAL | 11 refills | Status: AC

## 2018-09-18 NOTE — Progress Notes (Signed)
Patient given discharge instructions, and verbalized an understanding of all discharge instructions.  Patient agrees with discharge plan, and is being discharged in stable medical condition.  Patient given transportation via wheelchair. 

## 2018-09-18 NOTE — Discharge Summary (Signed)
Physician Discharge Summary  Stephen Grimes JOA:[id number] DOB: January 02, 1951 DOA: 09/12/2018  PCP: Seward Carol, MD  Admit date: 09/12/2018 Discharge date: 09/18/2018  Admitted From: Home Disposition: Home  Recommendations for Outpatient Follow-up:  1. Follow up with PCP in 1-2 weeks 2. Please obtain BMP/CBC in one week 3. Please follow up [facility] for metastatic renal cell cancer  Home Health: Yes Equipment/Devices: None  Discharge Condition stable and improved  CODE STATUS: Full  diet cardiac  Brief/Interim Summary: 68 year old male with history of metastatic renal carcinoma, alcoholic liver cirrhosis, essential hypertension, hypothyroidism who was brought in by family members on 09/12/2022 poor oral intake the last few days. She was found to have sodium of 118 on presentation with creatinine of 3.36. Ammonia was normal. White count of 16.2. Chest x-ray raise concern for left lung consolidation. He was admitted for pneumonia as well as hyponatremia   Discharge Diagnoses:  Principal Problem:   HCAP (healthcare-associated pneumonia) Active Problems:   AKI (acute kidney injury) ([place])   Alcoholic cirrhosis of liver without ascites (H[facility])   Hypothyroidism, unspecified   Malignant neoplasm of unspecified kidney, except renal pelvis ([place])   Hyponatremia   Acute metabolic encephalopathy   Sepsis ([place])   Sepsis secondary to pneumonia -Cultures negative so far. Procalcitonin was 34.67 on admission.  -Influenza PCR negative. Respiratory viral panel negative.    Patient was treated with cefepime he received a total of 7 days.  He will not be discharged on p.o. antibiotics.  His ambulatory pulse ox was above 92%.    Hypovolemic hyponatremiawith metabolic acidosis -Presented with sodium of 118. Treated with IV fluids. 133  on the day of discharge. Acidosis improved with bicarb drip.  Nausea, vomiting, diarrhea resolved.  GI PCR negative.  Acute metabolic encephalopathy  improved -Most likely from pneumonia and hyponatremia. -CT of the brain was negative for any acute abnormality. Patient was supposed to have an MRI of the brain with/without contrast as an outpatient this week as per [facility].  - MRI brain was negative for any acute abnormalities.Fall precautions.  -PT OT recommend home health PT/OT  Essential hypertension -Blood pressuresoftContinue amlodipine.  Dose of amlodipine has been decreased to 5 mg from 10 mg. Losartan on hold  Acute kidney injury on chronic kidney disease stage III -With IV fluids. Creatinine 1.14 today.  History of alcoholic cirrhosis - Not on any diuretics currently. Outpatient follow-up  Hypothyroidism -TSH was elevated at 16, free T4 low -Levothyroxine dose was increased from2/24/2020 onwards.Continue  Metastatic renal cancer -Follows at [facility]. Continue outpatient follow-up  Generalized deconditioning -PTrecommend home health PT/OT  .Estimated body mass index is 21.77 kg/m as calculated from the following:   Height as of this encounter: 5\' 7"  (1.702 m).   Weight as of this encounter: 63 kg.  Discharge Instructions  Discharge Instructions    Call MD for:  difficulty breathing, headache or visual disturbances   Complete by:  As directed    Call MD for:  persistant dizziness or light-headedness   Complete by:  As directed    Call MD for:  persistant nausea and vomiting   Complete by:  As directed    Call MD for:  redness, tenderness, or signs of infection (pain, swelling, redness, odor or green/yellow discharge around incision site)   Complete by:  As directed    Call MD for:  severe uncontrolled pain   Complete by:  As directed    Call MD for:  temperature >100.4   Complete by:  As directed    Diet - low sodium heart healthy   Complete by:  As directed    Increase activity slowly   Complete by:  As directed      Allergies as of 09/18/2018   No Known Allergies     Medication List    STOP  taking these medications   amLODipine 10 MG tablet Commonly known as:  NORVASC     TAKE these medications   AFINITOR 5 MG tablet Generic drug:  everolimus Take 5 mg by mouth daily.   chlorproMAZINE 25 MG tablet Commonly known as:  THORAZINE Take 1 tablet (25 mg total) by mouth 3 (three) times daily as needed for hiccoughs.   cholestyramine 4 g packet Commonly known as:  QUESTRAN Take 4 g by mouth 2 (two) times daily.   LENVIMA (18 MG DAILY DOSE) 10 MG & 2 x 4 MG capsule Generic drug:  lenvatinib 18 mg daily dose Take by mouth See admin instructions. Take  One 10 mg tablet and two 4 mg tablets daily.   levothyroxine 112 MCG tablet Commonly known as:  SYNTHROID, LEVOTHROID Take 112 mcg by mouth daily before breakfast.   losartan 25 MG tablet Commonly known as:  COZAAR Take 25 mg by mouth daily.   multivitamin Tabs tablet Take 1 tablet daily   omeprazole 20 MG capsule Commonly known as:  PRILOSEC TAKE 1 CAPSULE BY MOUTH ONCE DAILY   ondansetron 8 MG disintegrating tablet Commonly known as:  ZOFRAN-ODT Take 8 mg by mouth 3 (three) times daily as needed for nausea/vomiting.   urea 40 % Crea Commonly known as:  CARMOL Apply 1 application topically 2 (two) times daily. Apply to sore areas on hands and feet      Follow-up Information    Seward Carol, MD Follow up.   Specialty:  Internal Medicine Contact information: [address] [place] [address] [phone]          No Known Allergies  Consultations:  None   Procedures/Studies: Dg Chest 2 View  Result Date: 09/16/2018 CLINICAL DATA:  Dyspnea. EXAM: CHEST - 2 VIEW COMPARISON:  Radiographs of September 12, 2018. FINDINGS: The heart size and mediastinal contours are within normal limits. No pneumothorax is noted. Small bilateral pleural effusions are noted. Mild ill-defined left upper lobe airspace opacity is noted concerning for possible pneumonia. Right lung is unremarkable. The  visualized skeletal structures are unremarkable. IMPRESSION: Small bilateral pleural effusions. Left upper lobe airspace opacity is again noted concerning for pneumonia. Electronically Signed   By: Marijo Conception, M.D.   On: 09/16/2018 13:23   Dg Chest 2 View  Result Date: 09/12/2018 CLINICAL DATA:  Generalized weakness and vomiting. Patient is being treated with chemotherapy for renal cancer. EXAM: CHEST - 2 VIEW COMPARISON:  None. FINDINGS: Cardiomediastinal silhouette is normal. Mediastinal contours appear intact. There is no evidence of pleural effusion or pneumothorax. Dense airspace consolidation in the posterior segment of the left upper lobe Osseous structures are without acute abnormality. Soft tissues are grossly normal. IMPRESSION: Dense airspace consolidation in the posterior segment of the left upper lobe. In the acute clinical settings this likely represents pneumonia, however please follow-up to resolution after empiric treatment to exclude underlying pulmonary mass. Electronically Signed   By: Fidela Salisbury M.D.   On: 09/12/2018 15:55   Ct Head Wo Contrast  Result Date: 09/14/2018 CLINICAL DATA:  Encephalopathy. History of metastatic renal cell carcinoma, cirrhosis, hypertension. EXAM: CT HEAD WITHOUT CONTRAST  TECHNIQUE: Contiguous axial images were obtained from the base of the skull through the vertex without intravenous contrast. COMPARISON:  None. FINDINGS: BRAIN: No intraparenchymal hemorrhage, mass effect nor midline shift. No parenchymal brain volume loss for age. No hydrocephalus. Minimal supratentorial white matter hypodensities within normal range for patient's age, though non-specific are most compatible with chronic small vessel ischemic disease. No acute large vascular territory infarcts. No abnormal extra-axial fluid collections. Basal cisterns are patent. VASCULAR: Mild calcific atherosclerosis of the carotid siphons. SKULL: No skull fracture. No significant scalp soft  tissue swelling. SINUSES/ORBITS: Hypoplastic frontal sinuses. Mild sphenoid ethmoidal mucosal thickening. Mastoid air cells are well aerated. Included ocular globes and orbital contents are non-suspicious. OTHER: None. IMPRESSION: Negative CT HEAD without contrast for age. Electronically Signed   By: Elon Alas M.D.   On: 09/14/2018 16:05   Mr Jeri Cos PX Contrast  Result Date: 09/15/2018 CLINICAL DATA:  Altered mental status. EXAM: MRI HEAD WITHOUT AND WITH CONTRAST TECHNIQUE: Multiplanar, multiecho pulse sequences of the brain and surrounding structures were obtained without and with intravenous contrast. CONTRAST:  615 mL Gadavist COMPARISON:  CT head without contrast 09/14/2018 FINDINGS: Brain: Mild atrophy and diffuse white matter disease is present. White matter changes extend into the brainstem. No acute infarct, hemorrhage, or mass lesion is present. The ventricles are of proportionate to the degree of atrophy. No significant extraaxial fluid collection is present. Vascular: Flow is present in the major intracranial arteries. Skull and upper cervical spine: The craniocervical junction is normal. Upper cervical spine is within normal limits. Marrow signal is unremarkable. Sinuses/Orbits: There is scattered opacification of anterior ethmoid air cells, left greater than right. Mild mucosal thickening is present along the floor of the maxillary sinuses. No fluid levels are present. There is mucosal thickening in the sphenoid sinuses bilaterally. Globes and orbits are within normal limits. IMPRESSION: 1. No acute intracranial abnormality to explain altered mental status. 2. Mild atrophy and white matter disease. This likely reflects the sequela of chronic microvascular ischemia. 3. Mild diffuse sinus disease. No fluid levels to suggest acute sinusitis. Electronically Signed   By: San Morelle M.D.   On: 09/15/2018 12:03    (Echo, Carotid, EGD, Colonoscopy, ERCP)    Subjective:  Feels  better resting in bed wife by the bedside patient is on room air anxious to be going home Discharge Exam: Vitals:   09/17/18 2040 09/18/18 0437  BP: 132/77 130/68  Pulse: 91 83  Resp: 20 20  Temp: 99.5 F (37.5 C) 98.6 F (37 C)  SpO2: 97% 100%   Vitals:   09/17/18 0932 09/17/18 1435 09/17/18 2040 09/18/18 0437  BP:  125/74 132/77 130/68  Pulse: 96 79 91 83  Resp:  17 20 20   Temp:  98.4 F (36.9 C) 99.5 F (37.5 C) 98.6 F (37 C)  TempSrc:   Oral   SpO2: 92% 98% 97% 100%  Weight:      Height:        General: Pt is alert, awake, not in acute distress Cardiovascular: RRR, S1/S2 +, no rubs, no gallops Respiratory: CTA bilaterally, no wheezing, no rhonchi Abdominal: Soft, NT, ND, bowel sounds + Extremities: Trace edema bilaterally   The results of significant diagnostics from this hospitalization (including imaging, microbiology, ancillary and laboratory) are listed below for reference.     Microbiology: Recent Results (from the past 240 hour(s))  Culture, blood (Routine x 2)     Status: None   Collection Time: 09/12/18  3:00 PM  Result Value Ref Range Status   Specimen Description   Final    BLOOD RIGHT ANTECUBITAL Performed at [facility] [address], [place], [place] [address]    Special Requests   Final    BOTTLES DRAWN AEROBIC AND ANAEROBIC Blood Culture adequate volume Performed at [facility] [address] [place], [place] [address]    Culture   Final    NO GROWTH 5 DAYS Performed at [facility], [place] [address] [place], [place] [address]    Report Status 09/17/2018 FINAL  Final  Culture, blood (Routine x 2)     Status: None   Collection Time: 09/12/18  3:00 PM  Result Value Ref Range Status   Specimen Description   Final    BLOOD LEFT ARM Performed at [facility] [address], [place], [place] [address]    Special Requests   Final    BOTTLES DRAWN AEROBIC AND ANAEROBIC Blood  Culture adequate volume Performed at [facility] [address] [place], [place] [address]    Culture   Final    NO GROWTH 5 DAYS Performed at [facility], [place] [address], [place], [place] [address]    Report Status 09/17/2018 FINAL  Final  Urine culture     Status: None   Collection Time: 09/12/18  6:28 PM  Result Value Ref Range Status   Specimen Description   Final    URINE, CLEAN CATCH Performed at [facility], [place] [address] [place], [place] [address]    Special Requests   Final    NONE Performed at [facility], [place] [address], [place], [place] [address]    Culture   Final    NO GROWTH Performed at [facility], [place] [address], [place], [place] [address]    Report Status 09/14/2018 FINAL  Final  Respiratory Panel by PCR     Status: None   Collection Time: 09/12/18  8:15 PM  Result Value Ref Range Status   Adenovirus NOT DETECTED NOT DETECTED Final   Coronavirus 229E NOT DETECTED NOT DETECTED Final    Comment: (NOTE) The Coronavirus on the Respiratory Panel, DOES NOT test for the novel  Coronavirus (2019 nCoV)    Coronavirus HKU1 NOT DETECTED NOT DETECTED Final   Coronavirus NL63 NOT DETECTED NOT DETECTED Final   Coronavirus OC43 NOT DETECTED NOT DETECTED Final   Metapneumovirus NOT DETECTED NOT DETECTED Final   Rhinovirus / Enterovirus NOT DETECTED NOT DETECTED Final   Influenza A NOT DETECTED NOT DETECTED Final   Influenza B NOT DETECTED NOT DETECTED Final   Parainfluenza Virus 1 NOT DETECTED NOT DETECTED Final   Parainfluenza Virus 2 NOT DETECTED NOT DETECTED Final   Parainfluenza Virus 3 NOT DETECTED NOT DETECTED Final   Parainfluenza Virus 4 NOT DETECTED NOT DETECTED Final   Respiratory Syncytial Virus NOT DETECTED NOT DETECTED Final   Bordetella pertussis NOT DETECTED NOT DETECTED Final   Chlamydophila pneumoniae NOT DETECTED NOT DETECTED Final   Mycoplasma pneumoniae NOT DETECTED NOT  DETECTED Final    Comment: Performed at [facility], [place]. [address] [place],  [address]  MRSA PCR Screening     Status: None   Collection Time: 09/12/18  8:15 PM  Result Value Ref Range Status   MRSA by PCR NEGATIVE NEGATIVE Final    Comment:        The GeneXpert MRSA Assay (FDA  approved for NASAL specimens only), is one component of a comprehensive MRSA colonization surveillance program. It is not intended to diagnose MRSA infection nor to guide or monitor treatment for MRSA infections. Performed at [facility], [place] [address], [place], [place] [address]   Gastrointestinal Panel by PCR , Stool     Status: None   Collection Time: 09/14/18  8:39 AM  Result Value Ref Range Status   Campylobacter species NOT DETECTED NOT DETECTED Final   Plesimonas shigelloides NOT DETECTED NOT DETECTED Final   Salmonella species NOT DETECTED NOT DETECTED Final   Yersinia enterocolitica NOT DETECTED NOT DETECTED Final   Vibrio species NOT DETECTED NOT DETECTED Final   Vibrio cholerae NOT DETECTED NOT DETECTED Final   Enteroaggregative E coli (EAEC) NOT DETECTED NOT DETECTED Final   Enteropathogenic E coli (EPEC) NOT DETECTED NOT DETECTED Final   Enterotoxigenic E coli (ETEC) NOT DETECTED NOT DETECTED Final   Shiga like toxin producing E coli (STEC) NOT DETECTED NOT DETECTED Final   Shigella/Enteroinvasive E coli (EIEC) NOT DETECTED NOT DETECTED Final   Cryptosporidium NOT DETECTED NOT DETECTED Final   Cyclospora cayetanensis NOT DETECTED NOT DETECTED Final   Entamoeba histolytica NOT DETECTED NOT DETECTED Final   Giardia lamblia NOT DETECTED NOT DETECTED Final   Adenovirus F40/41 NOT DETECTED NOT DETECTED Final   Astrovirus NOT DETECTED NOT DETECTED Final   Norovirus GI/GII NOT DETECTED NOT DETECTED Final   Rotavirus A NOT DETECTED NOT DETECTED Final   Sapovirus (I, II, IV, and V) NOT DETECTED NOT DETECTED Final    Comment: Performed at [facility],  [place] [place], [place] [address]     Labs: BNP (last 3 results) No results for input(s): BNP in the last 8760 hours. Basic Metabolic Panel: Recent Labs  Lab 09/14/18 0346 09/15/18 0348 09/16/18 0635 09/17/18 0628 09/18/18 0546  NA 124* 131* 131* 132* 133*  K 4.2 4.1 3.7 3.5 3.4*  CL 102 110 114* 111 106  CO2 11* 12* 11* 15* 19*  GLUCOSE 108* 94 90 93 102*  BUN 47* 39* 28* 24* 20  CREATININE 1.60* 1.48* 1.23 1.13 1.14  CALCIUM 7.9* 7.7* 7.2* 7.4* 7.0*  MG  --   --  1.8 1.9  --    Liver Function Tests: Recent Labs  Lab 09/12/18 1500 09/16/18 0635  AST 94* 55*  ALT 37 24  ALKPHOS 169* 131*  BILITOT 1.2 0.7  PROT 8.2* 4.8*  ALBUMIN 2.9* 1.6*   Recent Labs  Lab 09/12/18 1500  LIPASE 64*   Recent Labs  Lab 09/12/18 1500 09/16/18 0635  AMMONIA 22 42*   CBC: Recent Labs  Lab 09/12/18 1500  09/14/18 0346 09/15/18 0348 09/16/18 0635 09/17/18 0628 09/18/18 0546  WBC 16.2*   < > 14.7* 10.2 8.3 7.4 8.6  NEUTROABS 13.4*  --   --   --  6.5 5.3  --   HGB 15.3   < > 14.1 14.3 11.2* 11.1* 10.3*  HCT 44.7   < > 41.2 42.3 31.8*  32.0* 32.6* 30.1*  MCV 90.9   < > 90.9 92.2 88.6 89.6 90.4  PLT 170   < > 141* 146* 153 165 143*   < > = values in this interval not displayed.   Cardiac Enzymes: No results for input(s): CKTOTAL, CKMB, CKMBINDEX, TROPONINI in the last 168 hours. BNP: Invalid input(s): POCBNP CBG: No results for input(s): GLUCAP in the last 168 hours. D-Dimer No results for input(s): DDIMER  in the last 72 hours. Hgb A1c No results for input(s): HGBA1C in the last 72 hours. Lipid Profile No results for input(s): CHOL, HDL, LDLCALC, TRIG, CHOLHDL, LDLDIRECT in the last 72 hours. Thyroid function studies Recent Labs    09/16/18 0635  T3FREE 0.6*   Anemia work up No results for input(s): VITAMINB12, FOLATE, FERRITIN, TIBC, IRON, RETICCTPCT in the last 72 hours. Urinalysis    Component Value Date/Time   COLORURINE YELLOW 09/12/2018  1828   APPEARANCEUR CLEAR 09/12/2018 1828   LABSPEC 1.016 09/12/2018 1828   PHURINE 6.0 09/12/2018 1828   GLUCOSEU NEGATIVE 09/12/2018 1828   HGBUR SMALL (A) 09/12/2018 1828   BILIRUBINUR NEGATIVE 09/12/2018 1828   KETONESUR NEGATIVE 09/12/2018 1828   PROTEINUR 30 (A) 09/12/2018 1828   NITRITE NEGATIVE 09/12/2018 1828   LEUKOCYTESUR NEGATIVE 09/12/2018 1828   Sepsis Labs Invalid input(s): PROCALCITONIN,  WBC,  LACTICIDVEN Microbiology Recent Results (from the past 240 hour(s))  Culture, blood (Routine x 2)     Status: None   Collection Time: 09/12/18  3:00 PM  Result Value Ref Range Status   Specimen Description   Final    BLOOD RIGHT ANTECUBITAL Performed at [facility], [place] [address] [place], [place] [address]    Special Requests   Final    BOTTLES DRAWN AEROBIC AND ANAEROBIC Blood Culture adequate volume Performed at [place] [address], [place], [place] [address]    Culture   Final    NO GROWTH 5 DAYS Performed at [facility], [place] [address], [place], [place] [address]    Report Status 09/17/2018 FINAL  Final  Culture, blood (Routine x 2)     Status: None   Collection Time: 09/12/18  3:00 PM  Result Value Ref Range Status   Specimen Description   Final    BLOOD LEFT ARM Performed at [facility] [address], [place], [place] [address]    Special Requests   Final    BOTTLES DRAWN AEROBIC AND ANAEROBIC Blood Culture adequate volume Performed at [place] [address], [place], [place] [address]    Culture   Final    NO GROWTH 5 DAYS Performed at [facility], [place] [address], [place], [place] [address]    Report Status 09/17/2018 FINAL  Final  Urine culture     Status: None   Collection Time: 09/12/18  6:28 PM  Result Value Ref Range Status   Specimen Description   Final    URINE, CLEAN CATCH Performed at [facility], [place] [address] [place], [place] [address]    Special Requests   Final    NONE Performed at [facility], [place] [address], [place], [place] [address]    Culture   Final    NO GROWTH Performed at [facility], [place] [address], [place], [place] [address]    Report Status 09/14/2018 FINAL  Final  Respiratory Panel by PCR     Status: None   Collection Time: 09/12/18  8:15 PM  Result Value Ref Range Status   Adenovirus NOT DETECTED NOT DETECTED Final   Coronavirus 229E NOT DETECTED NOT DETECTED Final    Comment: (NOTE) The Coronavirus on the Respiratory Panel, DOES NOT test for the novel  Coronavirus (2019 nCoV)    Coronavirus HKU1 NOT DETECTED NOT DETECTED Final   Coronavirus NL63 NOT DETECTED NOT DETECTED Final   Coronavirus OC43 NOT  DETECTED NOT DETECTED Final   Metapneumovirus NOT DETECTED NOT DETECTED Final   Rhinovirus / Enterovirus NOT DETECTED NOT DETECTED Final   Influenza A NOT DETECTED NOT DETECTED Final   Influenza B NOT DETECTED NOT DETECTED Final   Parainfluenza Virus 1 NOT DETECTED NOT DETECTED Final   Parainfluenza Virus 2 NOT DETECTED NOT DETECTED Final   Parainfluenza Virus 3 NOT DETECTED NOT DETECTED Final   Parainfluenza Virus 4 NOT DETECTED NOT DETECTED Final   Respiratory Syncytial Virus NOT DETECTED NOT DETECTED Final   Bordetella pertussis NOT DETECTED NOT DETECTED Final   Chlamydophila pneumoniae NOT DETECTED NOT DETECTED Final   Mycoplasma pneumoniae NOT DETECTED NOT DETECTED Final    Comment: Performed at [facility], [place] [address] [place], [place] [address]  MRSA PCR Screening     Status: None   Collection Time: 09/12/18  8:15 PM  Result Value Ref Range Status   MRSA by PCR NEGATIVE NEGATIVE Final    Comment:        The GeneXpert MRSA Assay (FDA approved for NASAL specimens only), is one component of a comprehensive MRSA colonization surveillance program. It is not intended to diagnose MRSA infection nor to guide or monitor  treatment for MRSA infections. Performed at [facility], [place] [address] [place], [place] [address]   Gastrointestinal Panel by PCR , Stool     Status: None   Collection Time: 09/14/18  8:39 AM  Result Value Ref Range Status   Campylobacter species NOT DETECTED NOT DETECTED Final   Plesimonas shigelloides NOT DETECTED NOT DETECTED Final   Salmonella species NOT DETECTED NOT DETECTED Final   Yersinia enterocolitica NOT DETECTED NOT DETECTED Final   Vibrio species NOT DETECTED NOT DETECTED Final   Vibrio cholerae NOT DETECTED NOT DETECTED Final   Enteroaggregative E coli (EAEC) NOT DETECTED NOT DETECTED Final   Enteropathogenic E coli (EPEC) NOT DETECTED NOT DETECTED Final   Enterotoxigenic E coli (ETEC) NOT DETECTED NOT DETECTED Final   Shiga like toxin producing E coli (STEC) NOT DETECTED NOT DETECTED Final   Shigella/Enteroinvasive E coli (EIEC) NOT DETECTED NOT DETECTED Final   Cryptosporidium NOT DETECTED NOT DETECTED Final   Cyclospora cayetanensis NOT DETECTED NOT DETECTED Final   Entamoeba histolytica NOT DETECTED NOT DETECTED Final   Giardia lamblia NOT DETECTED NOT DETECTED Final   Adenovirus F40/41 NOT DETECTED NOT DETECTED Final   Astrovirus NOT DETECTED NOT DETECTED Final   Norovirus GI/GII NOT DETECTED NOT DETECTED Final   Rotavirus A NOT DETECTED NOT DETECTED Final   Sapovirus (I, II, IV, and V) NOT DETECTED NOT DETECTED Final    Comment: Performed at [facility], [address], [place], [place] [address]     Time coordinating discharge: 56minutes  SIGNED:   Georgette Shell, MD  [facility] 09/18/2018, 8:26 AM Pager   If 7PM-7AM, please contact night-coverage [email or web address] Password [email or web address]

## 2018-09-18 NOTE — Care Management (Signed)
Pt offered choice for home health services and [facility] chosen. [facility] rep contacted for referral. Marney Doctor RN,BSN [phone]

## 2018-09-19 MED FILL — LENVIMA 18 MG/DAY (10 MG X 1 AND 4 MG X 2) CAPSULE: 30 days supply | Qty: 90 | Fill #2 | Status: AC

## 2018-09-19 MED FILL — LENVIMA 18 MG/DAY (10 MG X 1 AND 4 MG X 2) CAPSULE: ORAL | 30 days supply | Qty: 90 | Fill #2

## 2018-09-19 NOTE — Unmapped (Signed)
[facility] Clinical Assessment & Refill Coordination Note    Sergio Horton, DOB: 1950-12-14  Phone: [phone] (home) [phone] (work)    All above HIPAA information was verified with patient.     Patient called and stated he was confused by his medication count as he had 12 days of Afinitor therapy remaining and no Lenvima on hand. Patient wanted to know why this is. When asked, patient verified he had recently been in hospital and had taken his medication there with him (and could not remember if they gave him his medication upon discharge). Test claim for today was successful. Verified with Sergio Horton no other patient's needed quantity on hand (1 box in stock plus one more in order for a Sergio Horton Onc patient). Permission granted by [facility] for [employer] and patient made aware delivery would be made later today. Delivery Reference #[id number]    Specialty Medication(s):   Hematology/Oncology: [name]     Current Outpatient Medications   Medication Sig Dispense Refill   ??? amLODIPine (NORVASC) 10 MG tablet Take 1 tablet (10 mg total) by mouth daily. 90 tablet 6   ??? B complex-vitamin C-folic acid (RENA-VITE) 0.8 mg Tab Take 1 tablet daily 30 tablet 12   ??? everolimus, antineoplastic, (AFINITOR) 5 mg tablet Take 1 tablet (5 mg total) by mouth daily. 30 tablet 3   ??? lenvatinib 18 mg/day (10 mg x 1-4 mg x2) cap Take one 10mg  capsule and two 4mg  capsules (18 mg total) by mouth daily. 360 capsule 3   ??? levothyroxine (SYNTHROID, LEVOTHROID) 112 MCG tablet TK 1 T PO QAM ON AN EMPTY STOMACH  1   ??? losartan (COZAAR) 25 MG tablet TAKE 1 TABLET BY MOUTH ONCE DAILY 90 tablet 3   ??? omeprazole (PRILOSEC) 20 MG capsule TAKE 1 CAPSULE BY MOUTH ONCE DAILY 90 capsule 0   ??? ondansetron (ZOFRAN-ODT) 8 MG disintegrating tablet Take 1 tablet (8 mg total) by mouth every eight (8) hours as needed for nausea. 30 tablet 1   ??? urea (CARMOL) 40 % cream Apply twice daily to sore areas on hands and feet 28 g 1     No current facility-administered medications for this visit.         Changes to medications: Sergio Horton reports no changes reported at this time.    No Known Allergies    Changes to allergies: No    SPECIALTY MEDICATION ADHERENCE     Lenvima 18 (10+4+4) mg: 0 days of medicine on hand   Afinitor 5 mg: 12 days of medicine on hand       Medication Adherence    Adherence tools used:  medication list, directed education   Other adherence tool:  morning routine   Support network for adherence:  family member          Specialty medication(s) dose(s) confirmed: Regimen is correct and unchanged.     Are there any concerns with adherence? No    Adherence counseling provided? Not needed    CLINICAL MANAGEMENT AND INTERVENTION      Clinical Benefit Assessment:    Do you feel the medicine is effective or helping your condition? Yes    Clinical Benefit counseling provided? Not needed    Adverse Effects Assessment:    Are you experiencing any side effects? No    Are you experiencing difficulty administering your medicine? No    Quality of Life Assessment:    How many days over the past month  did your cancer  keep you from your normal activities? For example, brushing your teeth or getting up in the morning. 0    Have you discussed this with your provider? Not needed    Therapy Appropriateness:    Is therapy appropriate? Yes, therapy is appropriate and should be continued    DISEASE/MEDICATION-SPECIFIC INFORMATION      N/A    PATIENT SPECIFIC NEEDS     ? Does the patient have any physical, cognitive, or cultural barriers? No    ? Is the patient high risk? No     ? Does the patient require a Care Management Plan? No     ? Does the patient require physician intervention or other additional services (i.e. nutrition, smoking cessation, social work)? No      SHIPPING     Specialty Medication(s) to be Shipped:   Hematology/Oncology: [name]    Other medication(s) to be shipped: none     Changes to insurance: No    Delivery Scheduled: Yes, Expected medication delivery date: 09/19/18.     Medication will be delivered via [employer] courier service to the confirmed home address in [place].    The patient will receive a drug information handout for each medication shipped and additional FDA Medication Guides as required.  Verified that patient has previously received a Conservation officer, historic buildings.    Mervyn Gay   [facility] Specialty Pharmacist

## 2018-09-19 NOTE — Unmapped (Signed)
Hi,     Sergio Horton contacted the Communication Center requesting to speak with the care team of Sergio Horton to discuss:    Patient has been admitted locally for 6 days. Patient confused about state of affairs and requesting NN. Patient states medications are all out of whack.    Please contact Sergio Horton at [phone].    Check Indicates criteria has been reviewed and confirmed with the patient:    []  Preferred Name   []  DOB and/or MR#  []  Preferred Contact Method  []  Phone Number(s)   []  MyChart     Thank you,   Kelli Hope  [facility]   [phone]

## 2018-09-19 NOTE — Unmapped (Signed)
Called pt but unable to leave voicemail

## 2018-09-23 NOTE — Unmapped (Signed)
Spoke with pt - pt had missed a lot of appointments. Pt was just recently admitted at [facility] for pneumonia and was discharged 2/27. Pt not aware if he was off Lenvatinib/Everolimus while admitted but currently taking it now since discharged. Pt is feeling better and requesting to be rescheduled. Denies nausea/vomiting/diarrhea at this time. Rescheduled for this Thursday 3/5. Pt is aware.  MRI brain done at [facility] and will request for images.

## 2018-09-24 ENCOUNTER — Ambulatory Visit: Admit: 2018-09-24 | Discharge: 2018-09-25 | Payer: MEDICARE

## 2018-09-25 ENCOUNTER — Ambulatory Visit: Admit: 2018-09-25 | Discharge: 2018-09-26 | Payer: MEDICARE | Attending: Family | Primary: Family

## 2018-09-25 ENCOUNTER — Other Ambulatory Visit: Admit: 2018-09-25 | Discharge: 2018-09-26 | Payer: MEDICARE

## 2018-09-25 DIAGNOSIS — R945 Abnormal results of liver function studies: Principal | ICD-10-CM

## 2018-09-25 DIAGNOSIS — C649 Malignant neoplasm of unspecified kidney, except renal pelvis: Principal | ICD-10-CM

## 2018-09-25 DIAGNOSIS — Z8051 Family history of malignant neoplasm of kidney: Principal | ICD-10-CM

## 2018-09-25 DIAGNOSIS — C779 Secondary and unspecified malignant neoplasm of lymph node, unspecified: Principal | ICD-10-CM

## 2018-09-25 LAB — CBC W/ AUTO DIFF
BASOPHILS ABSOLUTE COUNT: 0 10*9/L (ref 0.0–0.1)
BASOPHILS RELATIVE PERCENT: 0.4 %
EOSINOPHILS ABSOLUTE COUNT: 0 10*9/L (ref 0.0–0.4)
EOSINOPHILS RELATIVE PERCENT: 0.8 %
HEMATOCRIT: 38.1 % — ABNORMAL LOW (ref 41.0–53.0)
HEMOGLOBIN: 12.5 g/dL — ABNORMAL LOW (ref 13.5–17.5)
LARGE UNSTAINED CELLS: 3 % (ref 0–4)
LYMPHOCYTES ABSOLUTE COUNT: 1.1 10*9/L — ABNORMAL LOW (ref 1.5–5.0)
LYMPHOCYTES RELATIVE PERCENT: 26.3 %
MEAN CORPUSCULAR HEMOGLOBIN: 31.1 pg (ref 26.0–34.0)
MEAN CORPUSCULAR VOLUME: 95 fL (ref 80.0–100.0)
MEAN PLATELET VOLUME: 8.2 fL (ref 7.0–10.0)
MONOCYTES ABSOLUTE COUNT: 0.3 10*9/L (ref 0.2–0.8)
NEUTROPHILS ABSOLUTE COUNT: 2.6 10*9/L (ref 2.0–7.5)
NEUTROPHILS RELATIVE PERCENT: 62.9 %
PLATELET COUNT: 257 10*9/L (ref 150–440)
RED BLOOD CELL COUNT: 4.01 10*12/L — ABNORMAL LOW (ref 4.50–5.90)
RED BLOOD CELL COUNT: 4.01 10*12/L — ABNORMAL LOW (ref 4.50–5.90)
RED CELL DISTRIBUTION WIDTH: 16.6 % — ABNORMAL HIGH (ref 12.0–15.0)
WBC ADJUSTED: 4.1 10*9/L — ABNORMAL LOW (ref 4.5–11.0)

## 2018-09-25 LAB — BASIC METABOLIC PANEL
ANION GAP: 9 mmol/L (ref 7–15)
BLOOD UREA NITROGEN: 11 mg/dL (ref 7–21)
BUN / CREAT RATIO: 11
CALCIUM: 8.1 mg/dL — ABNORMAL LOW (ref 8.5–10.2)
CO2: 21 mmol/L — ABNORMAL LOW (ref 22.0–30.0)
CREATININE: 1.03 mg/dL (ref 0.70–1.30)
EGFR CKD-EPI AA MALE: 85 mL/min/{1.73_m2} (ref >=60)
EGFR CKD-EPI NON-AA MALE: 74 mL/min/{1.73_m2} (ref >=60)
GLUCOSE RANDOM: 90 mg/dL (ref 70–179)
POTASSIUM: 4.2 mmol/L (ref 3.5–5.0)
SODIUM: 137 mmol/L (ref 135–145)

## 2018-09-25 LAB — HEPATIC FUNCTION PANEL
ALBUMIN: 3 g/dL — ABNORMAL LOW (ref 3.5–5.0)
ALKALINE PHOSPHATASE: 220 U/L — ABNORMAL HIGH (ref 38–126)
AST (SGOT): 90 U/L — ABNORMAL HIGH (ref 19–55)
BILIRUBIN DIRECT: 0.2 mg/dL (ref 0.00–0.40)
PROTEIN TOTAL: 6.6 g/dL (ref 6.5–8.3)

## 2018-09-25 NOTE — Unmapped (Addendum)
Plan:  RTC in 2 weeks with labs and CT scan  Hold Cancer drugs (lenvatinib + Everolimus) until after labs have been confirmed on Monday   Labs to be drawn locally on Monday (Liver function test only- orders placed)    Lab Results   Component Value Date    WBC 4.1 (L) 09/25/2018    HGB 12.5 (L) 09/25/2018    HCT 38.1 (L) 09/25/2018    PLT 257 09/25/2018       Lab Results   Component Value Date    NA 137 09/25/2018    K 4.2 09/25/2018    CL 107 09/25/2018    CO2 21.0 (L) 09/25/2018    BUN 11 09/25/2018    CREATININE 1.03 09/25/2018    CALCIUM 8.1 (L) 09/25/2018    MG 1.4 (L) 08/20/2018    PHOS 3.5 07/19/2014       Lab Results   Component Value Date    PT 12.4 12/24/2016    INR 1.06 12/24/2016    APTT 31.2 12/24/2016

## 2018-09-25 NOTE — Unmapped (Signed)
Blood drawn using 23 ga butterfly needle to LFA successful 1 attempt. Pt tolerated without difficulty. Lab work  tubed to lab.

## 2018-09-25 NOTE — Unmapped (Signed)
-----   Message from Ava R Pettiford sent at 09/24/2018  8:46 AM EST -----  Sergio Horton,    The outside images have been received in PACS.      Thanks  Ava Pettifrd  ----- Message -----  From: Elijah Birk, RN  Sent: 09/23/2018  11:53 AM EST  To: Ava R Pettiford    Ava,    Please see message below. Sorry for not copying you.    Sergio Horton  ----- Message -----  From: Estevan Oaks  Sent: 09/23/2018  11:50 AM EST  To: Elijah Birk, RN    Pt scheduled.    The initial message you did not copy Ava in it.    Thanks,  Jaynie Bream  ----- Message -----  From: Elijah Birk, RN  Sent: 09/23/2018  11:32 AM EST  To: Dennard Nip you please schedule pt on 3/5 - labs 8:30 and 9:30 with [facility]. Pt had MRI done at [facility] and no need to reschedule. I can call pt.  Ava - can you please request images for MRI brain and CT head from [facility]. Report is on care everywhere.    Thanks,  [employer]

## 2018-09-25 NOTE — Unmapped (Signed)
1610:  No lab orders.  Provider paged.  Pt aware of delay.

## 2018-09-25 NOTE — Unmapped (Signed)
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 He has progressed through Sunitnib, Nivolumab + Ipilimumab via OMNIVORE clinical trial, Cabozantinib, and has now started on treatment with Lenvatinib + everolimus.        1. ccRCC: on treatment with Lenvtinib + Everolimus (1/2/20120-present)    ?? Previous Treatments: Sunitnib (07/27/2016-12/03/2016), Nivolumab + Ipilimumab + Nivo via OMNIVORE clinical trial (01/21/2017-05/29/2017), and Cabozantinib (07/08/2017-07/22/2018)     ?? Previous imaging from 04/2018, with overall disease stability, but mild enlargement in paraortic nodes. We elected to continue treatment on cabozantinib given good tolerability and only small amount of growth, with plan to repeat imaging in 8 weeks. His 8 week interval scans were reviewed at his last visit and unfortunately, the scans continued to demonstrate growth, as well as, increased peritoneal thickening. We did review imaging in tumor board and it was agreed that his disease was progressing radiographically. Therefore we recommended treatment change to Lenvatinib + everoliums, as he has seen 2 TKI's and immunotherapy, but has never been treated with a mTOR inhibitor. He started the agents approx 4 weeks ago (07/24/2018) and returns today for evaluation. He continues to tolerate ok overall, with his only complaints being intermittent diarrhea and decreased appetite. His wife also accompanies him today to discuss recent forgetfulness and depression (see below for discussion)    ?? Tempus testing-- pending    2. Cirrhosis: Has intermittent baseline leukopenia and thrombocytopia, although reports no episodes of decompensated cirrhosis and currently in normal range. Stable and well compensated. ?? Avoids tylenol  ?? Denies current alcohol use    3. HTN: better controled. Currently on amlodipine 10mg  and losartan 25mg .  ?? Home readings 130-140's/80's  ?? Discontinued lisinopril previously due to cough     4. Asymmetric LE edema: No DVT per recent doppler    5. Elevated TSH: On synthroid, follows with PCP    6. Ventral hernia: easily reduces. Advised wearing abdominal binder if is becomes bothersome. He has met with  Dr. Carlynn Purl and will follow up with her after imaging has been completed in October. Given disease growth, I would depolarize this at this time.       7. Elevated sCr: increased from baseline today.  Patient to receive IV fluids 1L normal saline per therapy plan.      8. GERD: prilosec    9. Health Maintenance: Influenza given 04/2018    10. PPE - no blisters today  ?? Continue urea cream    11. Diarrhea: Patient reports three to four watery bowel movements daily.  Patient is taking imodium intermittently. We reviewed taking imodium scheduled every morning and again at lunch time if needed.     12. Decreasing plt count last reading of 136 on 08/20/17.  Stable today at 188    13. Forgetfulness- Patient's wife reports increased, intermittent forgetfulness with daily tasks.  Possible causes of forgetfulness discussed included, brain metastasis depressed mood due to seasonal depression, prognosis of disease, side effects of treatment, and strain of family and work relationships.  Patient encouraged to assess emotional and cognitive interventions. Medications for depression discussed (Zoloft), but patient declines at this time. Brain MRI scheduled prior to next visit to r/o pathologic causes, although there are no neurologic changes/deficits to highly  suggest brain mets, however will scan to be sure.          Greater than 50% of today's 30 minute office visit was dedicated to face-to-face counseling on test results, diagnosis, treatment and prognosis-- [name], FNP- BC    History of Present Illness: Sergio Horton is a 67 y.o. male with h/o right nephrectomy and caval thrombectomy for ccRCC who is seen in follow-up for met ccRCC.    He originally presented in 2015 with a large right renal mass. He underwent nephrectomy in 06/2014 and pathology showed a 11.5cm grade 2 ccRCC with invasion into the perirenal adipose tissue (pT3a). He also had a retrocaval mass resected that also was ccRCC. Post-op course was complicated by respiratory failure requiring ECMO. He was in the hospital for a month after his surgery. Routine imaging demonstrated likely local recurrence and biopsy confirmed RCC. He initiated sunitinib on 07/27/16 and dose reduced 08/22/16 due to neutropenia, restarted 09/03/16, the dose reduced again for HFS. CT on 12/03/16 showed POD and sunitinib was stopped.  He transitioned to treatment via OMNIVORE trial (01/07/2017-06/24/2017), that utilized single agent nivolumab, until POD was noted, then he received 2 cycles of Ipi + Nivo. Repeat imaging reported POD.     Given POD on IO, he started Cabozantinib on 07/03/2017. To note he was started on 40mg  given significant liver disease, and was dose reduced to 20mg  after approx 6mon on treatment 2/2 persistent SE's including PPE, HTN, fatigue, appetite loss, and intermittent elevation in LFTs. He did well on 20mg , but most recent imaging from 06/2018 reported overall disease growth.     Lenvtinib + everolimus was recommended after imaging review in tumor board. He was able to start this regimen 07/24/2018.     Interval History:  Sergio Horton returns today accompanied by his wife, who normally does not attend his office visits, but comes out of concern today. She endorses more forgetfulness in Sergio Horton's day to day routines, however, Sergio Horton has been very preoccupied with working, caring for her as her health is not good, and dealing with his own advancing disease and treatment SE's. She denies any notable neurologic changes, only reports that he forgets to close the door, or complete daily tasks. Sergio Horton endorse feeling more stressed recently, and also reports seasonal depression, including lack of desire to want to do the things he enjoys. He feels tired, but is sleeping well at night. He denies HA, vision changes, dizziness, or difficulty with ambulation. He is trying to maintain a positive outlook, but the weather has him down. He does not take any antidepressants, sleep aids, or seek council.     Diarrhea is intermittent, he is using imodium prn. He is trying to maintain good hydration with water and gatorade    Appetite is fair, but his weight is stable    His energy level remains low today, but likely multifactorial     No new pain    No blisters noted to hands/feet, but 2 small dry patches noted to his fingers. He is using urea cream       Past Medical History:  Past Medical History:   Diagnosis Date   ??? Cirrhosis of liver (CMS-HCC)    ??? Hypertension    ??? Hypothyroidism    ??? Renal cancer (CMS-HCC)    ??? Thrombocytopathia (CMS-HCC)    Alcoholic Cirrhosis - denies any ascites or h/o encephalopathy  HTN   No history of autoimmune disorder.    Medications:  Current Outpatient Medications on File Prior to Visit   Medication Sig Dispense Refill   ??? amLODIPine (NORVASC) 10 MG tablet Take 1 tablet (10 mg total) by mouth daily. 90 tablet 6   ??? B complex-vitamin C-folic acid (RENA-VITE) 0.8 mg Tab Take 1 tablet daily 30 tablet 12   ??? everolimus, antineoplastic, (AFINITOR) 5 mg tablet Take 1 tablet (5 mg total) by mouth daily. 30 tablet 3   ??? lenvatinib 18 mg/day (10 mg x 1-4 mg x2) cap Take one 10mg  capsule and two 4mg  capsules (18 mg total) by mouth daily. 360 capsule 3   ??? levothyroxine (SYNTHROID, LEVOTHROID) 112 MCG tablet TK 1 T PO QAM ON AN EMPTY STOMACH  1   ??? losartan (COZAAR) 25 MG tablet TAKE 1 TABLET BY MOUTH ONCE DAILY 90 tablet 3   ??? omeprazole (PRILOSEC) 20 MG capsule TAKE 1 CAPSULE BY MOUTH ONCE DAILY 90 capsule 0   ??? urea (CARMOL) 40 % cream Apply twice daily to sore areas on hands and feet 28 g 1   ??? ondansetron (ZOFRAN-ODT) 8 MG disintegrating tablet Take 1 tablet (8 mg total) by mouth every eight (8) hours as needed for nausea. (Patient not taking: Reported on 09/25/2018) 30 tablet 1   ??? [DISCONTINUED] lisinopril (PRINIVIL,ZESTRIL) 10 MG tablet Take 1 tablet (10 mg total) by mouth daily. 90 tablet 3     No current facility-administered medications on file prior to visit.      Allergies:  No Known Allergies    Social History:  Lives in [place] with wife. He has a h/o cirrhosis from alcohol use but does not currently use alcohol. No tobacco use. No other drugs. Works as a Financial risk analyst.    Review of Systems: A 10 system review was completed and was negative except per HPI.    Physical Exam:  Vitals:    09/25/18 1056   BP: 155/83   Pulse: 78   Resp: 16   Temp: 36.6 ??C (97.9 ??F)   SpO2: 97%     ECOG PS 1    General:   No acute distress, alert and interactive   Eyes:    Extra ocular muscles intact.  Sclera not icteric.   ENT:  Oropharynx clear.   Neck:  Supple   Lymph Nodes:  No cervical adenopathy   Cardiovascular:  RRR without murmurs, rubs, gallops   Lungs:  Clear to auscultation bilaterally, without wheezes/crackles/rhonchi.   Skin:    Healing hand blisters, no other rash.    Abdomen:   Abdomen soft, not tender and not distended, +incisional hernia, well-healed incision   Extremities:   Warm and well-perfused with L>R edema.   Neurological:  Alert and oriented to person, place and time.     Labs:  Reviewed in Epic    Imaging:    05/25/16 CT CAP:  -- Status post right nephrectomy.  -- Irregular soft tissue density foci with peripheral enhancement in the right nephrectomy bed and extending inferiorly along the psoas muscle, concerning for recurrent malignancy.  -- Aortocaval lymphadenopathy.    07/04/16 Bone scan  -No evidence of metastatic osseous disease.    07/20/16 Echo:  ?? Normal left ventricular systolic function, ejection fraction > 55%  ?? Diastolic dysfunction - grade I (normal filling pressures)  ?? Normal right ventricular systolic function      09/24/16 CT CAP:  -- Status post right nephrectomy. There has been interval response to therapy, with decreasing size of recurrent tumor within the  nephrectomy bed, and decreasing aortocaval nodal tissue.  -- Unchanged ultrasound morphology of liver compatible with history of cirrhosis.    12/03/16 CT CAP:  -Worsening metastatic disease in the right nephrectomy bed with enlarging aortocaval nodes/nodules and new perihepatic implant.    For reference pericaval nodes/nodules now measure up to 2 cm, previously 1.5 cm (2:93). There is new enhancing lesion in/along the right psoas musculature measuring up to 1.8 cm (2:99-106).     03/05/2017 CT CAP s/p 4C Nivo on Omnivore:  - Increase appearance of metastatic implants along the inferior right hepatic lobe. Increased size of nodularity in the right nephrectomy and increased pericaval node/nodule compatible with worsening metastatic disease.  --Increased size of pericaval nodes/nodules measure up to 2.4 cm (2:93), previously 2.0 cm  --Slightly increased size of nodularity in the right nephrectomy bed measuring 3.6 by 3.4 x 2.0 cm (2:90)  -Small-volume ascites.      06/24/2017 CT CAP  -Progressive disease with increasing nephrectomy bed soft tissue, adjacent retroperitoneal adenopathy, right psoas/paraspinal soft tissue implants, and posterior perihepatic implant. No evidence of metastatic disease in the chest.  -Small amount of nonspecific fluid/small collection adjacent to the ventral abdominal hernia sac on the right measuring 1.6 cm. ??This appears increased compared to 04/02/17 but similar to 03/05/2017.  ????-Mild circumferential bladder wall thickening which may be due to contraction versus cystitis. Recommend correlation with urinalysis.  -Cirrhotic liver morphology.    09/11/2017 CT CAP s/p 8 weeks Cabozantinib  1. Stable to slight interval decrease in necrotic metastatic implants in the right renal fossa and necrotic aortocaval adenopathy. Stable perihepatic implant.   **Centrally necrotic 2.4 x 2.1 cm mass abutting and likely invading the adjacent right psoas muscle. This is not significantly changed from prior examination which time it measured 2.5 x 2.3 cm on remeasurement.   **3.1 x 1.9 cm necrotic mass (1:37), previously previously 3.8 x 2.2 cm.   **Aortocaval adenopathy measures up to 2.6 cm in short axis, previously 2.8 cm (1:30). A more inferior aortocaval node measures 2.5 cm, previously 2.7 cm (1:44).   2. Small amount of fluid along the right lateral aspect of the small and large bowel containing ventral hernia, nonspecific. No evidence of obstruction at the hernia site.-- he has seen a general surgeon recently regarding possible repair  3. Cirrhotic liver with small caliber gastrohepatic and esophageal varices.  4. No evidence of thoracic metastatic disease.    12/03/2017 CT CAP on Cabozantinib  1. Stable to slight interval decrease in necrotic metastatic implants in the right renal fossa and necrotic aortocaval adenopathy. Stable perihepatic implant.  2. Small amount of fluid along the right lateral aspect of the small and large bowel containing ventral hernia, nonspecific. No evidence of obstruction at the hernia site.  3. Cirrhotic liver with small caliber gastrohepatic and esophageal varices.  4. No evidence of thoracic metastatic disease.    CT CAP 04/30/2018  IMPRESSION:  Since 02/26/2018:  -Mildly increased size of necrotic peritoneal implants and retroperitoneal nodes with invasion into the right psoas and through the posterior abdominal wall and into the paraspinal musculature as described above.  -Focal small bowel thickening within the pelvis that is new. This may represent focal enteritis versus focal lesion. Close attention on follow-up.   -No other sites of disease identified.  -There is new small volume ascites which is likely related to cirrhosis and portal hypertension as evidenced by small caliber paraesophageal and upper abdominal varices.    Imaging reviewed from today; 07/09/2018;  CT CAP  --Status post right nephrectomy.  ??  --Mild interval increase in size of right retroperitoneal metastatic implants as above.  ??  --Large anterior abdominal wall bowel-containing hernia with multiloculated rim enhancing pockets of fluid, some of them only slightly decreased but overall grossly unchanged from prior.    --LYMPH NODES: Interval progression of centrally necrotic extracapsular implant posterior to the hepatic segment 6, measuring 1.2 x 2.2 cm (previously 1.2 x 1.9 cm) (1:43).  Mildly increased right retroperitoneal centrally necrotic paraspinal and aortocaval metastases, as a reference:  -Right paraspinal lesion medial to the caudate lobe with invasion of the psoas muscle, intercostal muscle and paraspinal muscles, orbital measuring 6.8 x 2.9 cm (previously 6.0 x 2.6 cm) (1:38).   -Implant within the right psoas measures 1.8 x 2.2 cm (previously 1.7 x 2.4 cm).  -2.8 x 4.5 cm, aortocaval, previously 2.4 x 4.0 cm (1:55). The adjacent IVC is displaced laterally.    Pathology:    06/29/14:  Diagnosis:  A: ??Liver, segment 5, core needle biopsy  - Cirrhosis, etiology uncertain (see Light Microscopy) ??    B: ??Kidney and adrenal, right, radical nephrectomy, adrenalectomy, and caval  thrombectomy   Tumor histologic type/subtype: ??clear cell carcinoma  Sarcomatoid features:?????????? not identified   Histologic grade: Grade 2 (of 4) Fuhrman classification??????????  Tumor size: 11.5 cm diameter   Tumor focality: unifocal     Extent of invasion:??????????  ???? ?? Extra-capsular invasion into perirenal adipose tissue: not identified  ???? ?? Gerota's fascia: not involved   ???? ?? Renal sinus: not involved ??????????  ???? ?? Major veins (renal vein or segmental branches, IVC): involved (macro and  micro)  ???? ?? Ureter: not involved   ???? ?? Venous: ??involved   ???? ?? Lymphatic:??????????not involved   ???? ??   Histologic assessment of surgical margins:??????????  ???? ?? Peri-nephric adipose tissue margin (partial nephrectomy only): negative   ???? ?? Gerota's fascia: negative   ???? ?? Renal vein margin:??????????no adherent carcinoma identified on initial or  recuts of en face margin (B2) ??  ???? ?? Ureter margin: ??negative   Adrenal gland:?????????? negative   Lymph nodes:??????????none received     Pathologic findings in non-neoplastic kidney: focal global glomerulosclerosis   AJCC Stage (renal primary): ??pT3b (intrahepatic caval involvement) ?? pNx ?? pMx    C: ??Mass, retrocaval, excision   - Renal clear cell carcinoma, partially encapsulated, presumed confined by  Gerota's fascia, extending to uninked tissue edges    06/27/16:   Right nephrectomy bed, core biopsy and touch preparation:  - Diagnostic of malignancy.  - Morphology consistent with recurrence of patient's known clear cell renal cell carcinoma (Grade 2 of 4 Fuhrman Nuclear Grade).

## 2018-09-30 NOTE — Unmapped (Signed)
[facility] Refill Coordination Note    Specialty Medication(s) to be Shipped:   Hematology/Oncology: Afinitor 5 mg       Sergio Horton, DOB: 1951/03/13  Phone: [phone] (home) [phone] (work)      All above HIPAA information was verified with patient.     Completed refill call assessment today to schedule patient's medication shipment from the [facility] [phone]).       Specialty medication(s) and dose(s) confirmed: Regimen is correct and unchanged.   Changes to medications: Alif reports no changes reported at this time.  Changes to insurance: No  Questions for the pharmacist: No    Confirmed patient received Welcome Packet with first shipment. The patient will receive a drug information handout for each medication shipped and additional FDA Medication Guides as required.       DISEASE/MEDICATION-SPECIFIC INFORMATION        N/A    SPECIALTY MEDICATION ADHERENCE     Medication Adherence    Patient reported X missed doses in the last month:  0  Specialty Medication:  Afinitor 5 mg  Patient is on additional specialty medications:  No  Patient is on more than two specialty medications:  No  Any gaps in refill history greater than 2 weeks in the last 3 months:  no  Demonstrates understanding of importance of adherence:  yes  Informant:  patient  Reliability of informant:  reliable  Provider-estimated medication adherence level:  good  Patient is at risk for Non-Adherence:  No  Adherence tools used:  medication list, directed education   Other adherence tool:  morning routine   Support network for adherence:  family member          Refill Coordination    Has the Patients' Contact Information Changed:  No  Is the Shipping Address Different:  No           Afinitor 5 mg: 3 days of medicine on hand         SHIPPING     Shipping address confirmed in Epic.     Delivery Scheduled: Yes, Expected medication delivery date: 10/02/2018.     Medication will be delivered via UPS to the home address in Epic WAM.    [name]   [facility] Specialty Technician

## 2018-10-01 MED FILL — AFINITOR 5 MG TABLET: ORAL | 28 days supply | Qty: 28 | Fill #2

## 2018-10-01 MED FILL — AFINITOR 5 MG TABLET: 28 days supply | Qty: 28 | Fill #2 | Status: AC

## 2018-10-01 NOTE — Unmapped (Signed)
Genitourinary Oncology Clinic Follow-Up Note    Patient Name: Sergio Horton  Encounter Date: 09/25/2018    Referring Physician: Renford Horton, Sergio Horton, [place] Sergio Horton)  Urologist: Sergio Horton    Assessment/Plan:    68 y.o.M with alcoholic cirrhosis, HTN, and h/o pT3a ccRCC s/p 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 He has progressed through Sunitnib, Nivolumab + Ipilimumab via OMNIVORE clinical trial, Cabozantinib, and has now started on treatment with Lenvatinib + everolimus.      He returns today for evaluation after recent hospitalization for PNA. Treatment has been on hold x 7 days.       1. ccRCC: on treatment with Lenvtinib + Everolimus (1/2/20120-present)    ?? Previous Treatments: Sunitnib (07/27/2016-12/03/2016), Nivolumab + Ipilimumab + Nivo via OMNIVORE clinical trial (01/21/2017-05/29/2017), and Cabozantinib (07/08/2017-07/22/2018)  ?? Tempus testing results below   ?? Will hold Lenvtinib + Everolimus until after repeat labs have been reviewed on Monday given mild elevation in LFT's and in the setting of recent PNA treatment requiring hospitalization       2. Cirrhosis: Has intermittent baseline leukopenia and thrombocytopia, although reports no episodes of decompensated cirrhosis and currently in normal range. Today AST mildly elevated.   ?? Avoids tylenol  ?? Denies current alcohol use    3. HTN: better controled. Currently on amlodipine 10mg  and losartan 25mg .  ?? Home readings 130-140's/80's  ?? Discontinued lisinopril previously due to cough     4. Asymmetric LE edema: No DVT per recent doppler    5. Elevated TSH: On synthroid, follows with PCP    6. Ventral hernia: easily reduces. Advised wearing abdominal binder if is becomes bothersome. He has met with  Dr. Carlynn Horton and will follow up with her after imaging has been completed in October. Given disease growth, I would depolarize this at this time.       7. Elevated sCr: stable today      8. GERD: prilosec    9. Health Maintenance: Influenza given 04/2018    10. PPE - no blisters today  ?? Continue urea cream    11. Diarrhea: Patient reports 2-3 watery bowel movements daily.  Patient is taking imodium intermittently. We reviewed taking imodium scheduled every morning and again at lunch time if needed.     12. Decreasing plt count last reading of 136 on 08/20/17.  Stable today at 188    13. Forgetfulness- Patient's wife reports increased, intermittent forgetfulness with daily tasks.  Possible causes of forgetfulness discussed included, brain metastasis depressed mood due to seasonal depression, prognosis of disease, side effects of treatment, and strain of family and work relationships.  Patient encouraged to assess emotional and cognitive interventions. Medications for depression discussed (Zoloft), but patient declines at this time.  -- Brain MRI reviewed today with no pathological findings.        Plan Review:   ?? HOLD lenvatinib + everolimus until after labs have been reviewed on Monday  ?? RTC in 2 weeks with CT imaging for restaging  ?? Continue good self care  ?? Consider Zoloft for depression  ?? Imodium daily in the AM for diarrhea        Greater than 50% of today's 30 minute office visit was dedicated to face-to-face counseling on test results, diagnosis, treatment and prognosis-- Sergio Horton, Sergio Horton    History of Present Illness:    Sergio Horton is a 68 y.o. male with h/o right nephrectomy and caval thrombectomy for ccRCC who is seen in follow-up for met ccRCC.  He originally presented in 2015 with a large right renal mass. He underwent nephrectomy in 06/2014 and pathology showed a 11.5cm grade 2 ccRCC with invasion into the perirenal adipose tissue (pT3a). He also had a retrocaval mass resected that also was ccRCC. Post-op course was complicated by respiratory failure requiring ECMO. He was in the hospital for a month after his surgery. Routine imaging demonstrated likely local recurrence and biopsy confirmed RCC. He initiated sunitinib on 07/27/16 and dose reduced 08/22/16 due to neutropenia, restarted 09/03/16, the dose reduced again for HFS. CT on 12/03/16 showed POD and sunitinib was stopped.  He transitioned to treatment via OMNIVORE trial (01/07/2017-06/24/2017), that utilized single agent nivolumab, until POD was noted, then he received 2 cycles of Ipi + Nivo. Repeat imaging reported POD.     Given POD on IO, he started Cabozantinib on 07/03/2017. To note he was started on 40mg  given significant liver disease, and was dose reduced to 20mg  after approx 6mon on treatment 2/2 persistent SE's including PPE, HTN, fatigue, appetite loss, and intermittent elevation in LFTs. He did well on 20mg , but most recent imaging from 06/2018 reported overall disease growth.     Lenvtinib + everolimus was recommended after imaging review in tumor board. He was able to start this regimen 07/24/2018.     Interval History:  Sergio Horton returns today accompanied by his wife. He is recovering from recent hospitalization, where he was treated for PNA.     Afebrile, no cough or SOB at this time    Diarrhea is intermittent, he is using imodium prn. He is trying to maintain good hydration with water and gatorade    Appetite is fair, and he has lost approx 5 lbs over the last 2 weeks    His energy level remains low today, but likely multifactorial, considering recent admission of PNA. He also endorse increased stress and seasonal depression.     No new pain    No blisters noted to hands/feet      Past Medical History:  Past Medical History:   Diagnosis Date   ??? Cirrhosis of liver (CMS-HCC)    ??? Hypertension    ??? Hypothyroidism    ??? Renal cancer (CMS-HCC)    ??? Thrombocytopathia (CMS-HCC)    Alcoholic Cirrhosis - denies any ascites or h/o encephalopathy  HTN   No history of autoimmune disorder.    Medications:    Current Outpatient Medications on File Prior to Visit   Medication Sig Dispense Refill   ??? amLODIPine (NORVASC) 10 MG tablet Take 1 tablet (10 mg total) by mouth daily. 90 tablet 6   ??? B complex-vitamin C-folic acid (RENA-VITE) 0.8 mg Tab Take 1 tablet daily 30 tablet 12   ??? everolimus, antineoplastic, (AFINITOR) 5 mg tablet Take 1 tablet (5 mg total) by mouth daily. 30 tablet 3   ??? lenvatinib 18 mg/day (10 mg x 1-4 mg x2) cap Take one 10mg  capsule and two 4mg  capsules (18 mg total) by mouth daily. 360 capsule 3   ??? levothyroxine (SYNTHROID, LEVOTHROID) 112 MCG tablet TK 1 T PO QAM ON AN EMPTY STOMACH  1   ??? losartan (COZAAR) 25 MG tablet TAKE 1 TABLET BY MOUTH ONCE DAILY 90 tablet 3   ??? omeprazole (PRILOSEC) 20 MG capsule TAKE 1 CAPSULE BY MOUTH ONCE DAILY 90 capsule 0   ??? urea (CARMOL) 40 % cream Apply twice daily to sore areas on hands and feet 28 g 1   ??? ondansetron (ZOFRAN-ODT) 8 MG  disintegrating tablet Take 1 tablet (8 mg total) by mouth every eight (8) hours as needed for nausea. (Patient not taking: Reported on 09/25/2018) 30 tablet 1   ??? [DISCONTINUED] lisinopril (PRINIVIL,ZESTRIL) 10 MG tablet Take 1 tablet (10 mg total) by mouth daily. 90 tablet 3     No current facility-administered medications on file prior to visit.      Allergies:  No Known Allergies    Social History:  Lives in [place] with wife. He has a h/o cirrhosis from alcohol use but does not currently use alcohol. No tobacco use. No other drugs. Works as a Financial risk analyst.    Review of Systems: A 10 system review was completed and was negative except per HPI.    Physical Exam:  Vitals:    09/25/18 1056   BP: 155/83   Pulse: 78   Resp: 16   Temp: 36.6 ??C (97.9 ??F)   SpO2: 97%     ECOG PS 1    General:   No acute distress, alert and interactive   Eyes:    Extra ocular muscles intact.  Sclera not icteric.   ENT:  Oropharynx clear.   Neck:  Supple   Lymph Nodes:  No cervical adenopathy   Cardiovascular:  RRR without murmurs, rubs, gallops   Lungs:  Clear to auscultation bilaterally, without wheezes/crackles/rhonchi.   Skin:    Healing hand blisters, no other rash.    Abdomen:   Abdomen soft, not tender and not distended, +incisional hernia, well-healed incision   Extremities:   Warm and well-perfused with L>R edema.   Neurological:  Alert and oriented to person, place and time.     Labs:  Reviewed in Epic    Imaging:    05/25/16 CT CAP:  -- Status post right nephrectomy.  -- Irregular soft tissue density foci with peripheral enhancement in the right nephrectomy bed and extending inferiorly along the psoas muscle, concerning for recurrent malignancy.  -- Aortocaval lymphadenopathy.    07/04/16 Bone scan  -No evidence of metastatic osseous disease.    07/20/16 Echo:  ?? Normal left ventricular systolic function, ejection fraction > 55%  ?? Diastolic dysfunction - grade I (normal filling pressures)  ?? Normal right ventricular systolic function      09/24/16 CT CAP:  -- Status post right nephrectomy. There has been interval response to therapy, with decreasing size of recurrent tumor within the nephrectomy bed, and decreasing aortocaval nodal tissue.  -- Unchanged ultrasound morphology of liver compatible with history of cirrhosis.    12/03/16 CT CAP:  -Worsening metastatic disease in the right nephrectomy bed with enlarging aortocaval nodes/nodules and new perihepatic implant.    For reference pericaval nodes/nodules now measure up to 2 cm, previously 1.5 cm (2:93). There is new enhancing lesion in/along the right psoas musculature measuring up to 1.8 cm (2:99-106).     03/05/2017 CT CAP s/p 4C Nivo on Omnivore:  - Increase appearance of metastatic implants along the inferior right hepatic lobe. Increased size of nodularity in the right nephrectomy and increased pericaval node/nodule compatible with worsening metastatic disease.  --Increased size of pericaval nodes/nodules measure up to 2.4 cm (2:93), previously 2.0 cm  --Slightly increased size of nodularity in the right nephrectomy bed measuring 3.6 by 3.4 x 2.0 cm (2:90)  -Small-volume ascites.      06/24/2017 CT CAP  -Progressive disease with increasing nephrectomy bed soft tissue, adjacent retroperitoneal adenopathy, right psoas/paraspinal soft tissue implants, and posterior perihepatic implant. No evidence of metastatic  disease in the chest.  -Small amount of nonspecific fluid/small collection adjacent to the ventral abdominal hernia sac on the right measuring 1.6 cm. ??This appears increased compared to 04/02/17 but similar to 03/05/2017.  ????-Mild circumferential bladder wall thickening which may be due to contraction versus cystitis. Recommend correlation with urinalysis.  -Cirrhotic liver morphology.    09/11/2017 CT CAP s/p 8 weeks Cabozantinib  1. Stable to slight interval decrease in necrotic metastatic implants in the right renal fossa and necrotic aortocaval adenopathy. Stable perihepatic implant.   **Centrally necrotic 2.4 x 2.1 cm mass abutting and likely invading the adjacent right psoas muscle. This is not significantly changed from prior examination which time it measured 2.5 x 2.3 cm on remeasurement.   **3.1 x 1.9 cm necrotic mass (1:37), previously previously 3.8 x 2.2 cm.   **Aortocaval adenopathy measures up to 2.6 cm in short axis, previously 2.8 cm (1:30). A more inferior aortocaval node measures 2.5 cm, previously 2.7 cm (1:44).   2. Small amount of fluid along the right lateral aspect of the small and large bowel containing ventral hernia, nonspecific. No evidence of obstruction at the hernia site.-- he has seen a general surgeon recently regarding possible repair  3. Cirrhotic liver with small caliber gastrohepatic and esophageal varices.  4. No evidence of thoracic metastatic disease.    12/03/2017 CT CAP on Cabozantinib  1. Stable to slight interval decrease in necrotic metastatic implants in the right renal fossa and necrotic aortocaval adenopathy. Stable perihepatic implant.  2. Small amount of fluid along the right lateral aspect of the small and large bowel containing ventral hernia, nonspecific. No evidence of obstruction at the hernia site.  3. Cirrhotic liver with small caliber gastrohepatic and esophageal varices.  4. No evidence of thoracic metastatic disease.    CT CAP 04/30/2018  IMPRESSION:  Since 02/26/2018:  -Mildly increased size of necrotic peritoneal implants and retroperitoneal nodes with invasion into the right psoas and through the posterior abdominal wall and into the paraspinal musculature as described above.  -Focal small bowel thickening within the pelvis that is new. This may represent focal enteritis versus focal lesion. Close attention on follow-up.   -No other sites of disease identified.  -There is new small volume ascites which is likely related to cirrhosis and portal hypertension as evidenced by small caliber paraesophageal and upper abdominal varices.    Imaging reviewed from today; 07/09/2018; CT CAP  --Status post right nephrectomy.  ??  --Mild interval increase in size of right retroperitoneal metastatic implants as above.  ??  --Large anterior abdominal wall bowel-containing hernia with multiloculated rim enhancing pockets of fluid, some of them only slightly decreased but overall grossly unchanged from prior.    --LYMPH NODES: Interval progression of centrally necrotic extracapsular implant posterior to the hepatic segment 6, measuring 1.2 x 2.2 cm (previously 1.2 x 1.9 cm) (1:43).  Mildly increased right retroperitoneal centrally necrotic paraspinal and aortocaval metastases, as a reference:  -Right paraspinal lesion medial to the caudate lobe with invasion of the psoas muscle, intercostal muscle and paraspinal muscles, orbital measuring 6.8 x 2.9 cm (previously 6.0 x 2.6 cm) (1:38).   -Implant within the right psoas measures 1.8 x 2.2 cm (previously 1.7 x 2.4 cm).  -2.8 x 4.5 cm, aortocaval, previously 2.4 x 4.0 cm (1:55). The adjacent IVC is displaced laterally.    Pathology:    06/29/14:  Diagnosis:  A: ??Liver, segment 5, core needle biopsy  - Cirrhosis, etiology uncertain (see Light Microscopy) ??  B: ??Kidney and adrenal, right, radical nephrectomy, adrenalectomy, and caval  thrombectomy   Tumor histologic type/subtype: ??clear cell carcinoma  Sarcomatoid features:?????????? not identified   Histologic grade: Grade 2 (of 4) Fuhrman classification??????????  Tumor size: 11.5 cm diameter   Tumor focality: unifocal     Extent of invasion:??????????  ???? ?? Extra-capsular invasion into perirenal adipose tissue: not identified  ???? ?? Gerota's fascia: not involved   ???? ?? Renal sinus: not involved ??????????  ???? ?? Major veins (renal vein or segmental branches, IVC): involved (macro and  micro)  ???? ?? Ureter: not involved   ???? ?? Venous: ??involved   ???? ?? Lymphatic:??????????not involved   ???? ??   Histologic assessment of surgical margins:??????????  ???? ?? Peri-nephric adipose tissue margin (partial nephrectomy only): negative   ???? ?? Gerota's fascia: negative   ???? ?? Renal vein margin:??????????no adherent carcinoma identified on initial or  recuts of en face margin (B2) ??  ???? ?? Ureter margin: ??negative   Adrenal gland:?????????? negative   Lymph nodes:??????????none received     Pathologic findings in non-neoplastic kidney: focal global glomerulosclerosis   AJCC Stage (renal primary): ??pT3b (intrahepatic caval involvement) ?? pNx ?? pMx    C: ??Mass, retrocaval, excision   - Renal clear cell carcinoma, partially encapsulated, presumed confined by  Gerota's fascia, extending to uninked tissue edges    06/27/16:   Right nephrectomy bed, core biopsy and touch preparation:  - Diagnostic of malignancy.  - Morphology consistent with recurrence of patient's known clear cell renal cell carcinoma (Grade 2 of 4 Fuhrman Nuclear Grade).      GEN O M I C     V A R I A N T S  Somatic - Potentially Actionable Variant Allele Fraction  PTPRD            p.E1458* Stop gain - LOF 50.2%  AKT2  Copy number gain  CCND3 Copy number gain  VEGFA Copy number gain  Somatic - Biologically Relevant   ZFHX3            p.Q1181* Stop gain - LOF 34.8%  TFEB              Copy number gain  Germline - Pathogenic / Likely Pathogenic  No pathogenic variants were found in the limited set of genes on which we report  MSI stable

## 2018-10-03 NOTE — Unmapped (Signed)
Sergio Horton  Male, 68 y.o., [birth year]  MRN:   [id number]  Phone:   [phone] Judie Petit)  PCP:   Adline Mango, MD  Primary Cvg:   MEDICARE/MEDICARE PART A AND PART B  Next Appt  With Hematology and Oncology (ADULT ONC LAB)  10/09/2018 at 8:30 AM  RE: labs?   Received: Today   Message Contents   Darice Vicario Claudina Lick, RN  Kennieth Francois, FNP      ??      Called pt and he did not get labs because you told him to come back in 2 weeks. Reviewed your AVS instructions - come back in two weeks for scan and labs.     Soraya    Previous Messages      ----- Message -----   From: Kennieth Francois, FNP   Sent: 10/03/2018 ??12:09 PM EDT   To: Sergio Birk, RN   Subject: labs? ?? ?? ?? ?? ?? ?? ?? ?? ?? ?? ?? ?? ?? ?? ?? ?? ?? ?? ?? ??     Hey Monica Martinez- [name] was suppose to have labs drawn on Monday and I have not heard from him or seen labs. Do you mind following up please and thank you   BB

## 2018-10-06 DIAGNOSIS — C649 Malignant neoplasm of unspecified kidney, except renal pelvis: Principal | ICD-10-CM

## 2018-10-06 DIAGNOSIS — C779 Secondary and unspecified malignant neoplasm of lymph node, unspecified: Principal | ICD-10-CM

## 2018-10-06 NOTE — Unmapped (Signed)
Spoke with pt and let him know that we will keep his appointment on Thursday and will do labs then.

## 2018-10-06 NOTE — Unmapped (Signed)
Spoke with pt and let him know that we need labs. Pt will go to [facility] today. Orders placed.

## 2018-10-07 ENCOUNTER — Ambulatory Visit: Admit: 2018-10-07 | Discharge: 2018-10-08 | Payer: MEDICARE

## 2018-10-07 DIAGNOSIS — Z8051 Family history of malignant neoplasm of kidney: Principal | ICD-10-CM

## 2018-10-07 DIAGNOSIS — C779 Secondary and unspecified malignant neoplasm of lymph node, unspecified: Principal | ICD-10-CM

## 2018-10-07 DIAGNOSIS — C649 Malignant neoplasm of unspecified kidney, except renal pelvis: Principal | ICD-10-CM

## 2018-10-08 NOTE — Unmapped (Signed)
Plan Summary:   ?? Continue Lenvatinib + everolimus  ?? Phone F/U in 2 weeks  ?? Labs locally in 1-2 weeks  ?? RTC in 1 month  ?? Continue nutritional supplements  ?? Continue urea cream BID  ?? Continue imodium as needed for diarrhea  ?? Avoid tylenol and ETOH  ?? Call with any issues

## 2018-10-08 NOTE — Unmapped (Addendum)
Genitourinary Oncology Clinic Follow-Up Note/ PHONE ENCOUNTER    I spent 22 minutes on the phone with the patient. I spent an additional 10 minutes on pre- and post-visit activities.     The patient was physically located in [place] or a state in which I am permitted to provide care. The patient and/or parent/gauardian understood that s/he may incur co-pays and cost sharing, and agreed to the telemedicine visit. The visit was completed via phone and/or video, which was appropriate and reasonable under the circumstances given the patient's presentation at the time.    The patient and/or parent/guardian has been advised of the potential risks and limitations of this mode of treatment (including, but not limited to, the absence of in-person examination) and has agreed to be treated using telemedicine. The patient's/patient's family's questions regarding telemedicine have been answered.     If the phone/video visit was completed in an ambulatory setting, the patient and/or parent/guardian has also been advised to contact their provider???s office for worsening conditions, and seek emergency medical treatment and/or call 911 if the patient deems either necessary.      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 He has progressed through Sunitnib, Nivolumab + Ipilimumab via OMNIVORE clinical trial, Cabozantinib, and is now on treatment with Lenvatinib + everolimus.      He returns today for evaluation after recent hospitalization for PNA.      1. ccRCC: on treatment with Lenvtinib + Everolimus (1/2/20120-present)    ?? Previous Treatments: Sunitnib (07/27/2016-12/03/2016), Nivolumab + Ipilimumab + Nivo via OMNIVORE clinical trial (01/21/2017-05/29/2017), and Cabozantinib (07/08/2017-07/22/2018)  ?? Tempus testing results below   ?? Restarted Lenvatinib + everolimus on 10/01/2018 after holding x 10days 2/2 PNA requiring hospitalization   ?? We reviewed most recent imaging today from 10/07/2018 with overall decrease in all areas including adenopathy, hepatic lesions, and mass implants. Given overall reduction and increasing tolerability of medication he will continue on treatment, with a plan to repeat imaging in 8 weeks.     # Cirrhosis: Has intermittent baseline leukopenia and thrombocytopia, although reports no episodes of decompensated cirrhosis and currently in normal range. He had a mild elevation at his last visit but not significant. He will have labs drawn locally next week to ensure stability  ?? Avoids tylenol  ?? Denies current alcohol use    # HTN: better controled. Currently on amlodipine 10mg  and losartan 25mg .  ?? Home readings 130-140's/80's  ?? Discontinued lisinopril previously due to cough     # Asymmetric LE edema: No DVT per recent doppler. Today he reports swelling in bilateral ankles and feet, however, this is not new. We reviewed compression socks and elevation. Swelling may be related to everolimus, but likely multifactorial including malnutrition, and recent hospitalization      # Elevated TSH: On synthroid, follows with PCP    # Ventral hernia: easily reduces. Advised wearing abdominal binder if is becomes bothersome. He has met with  Dr. Carlynn Purl and will follow up with her after imaging has been completed in October. Given disease growth, I would depolarize this at this time.       # Baseline Elevated sCr: monitor    # GERD: prilosec    # Health Maintenance: Influenza given 04/2018    #. PPE -  no blisters today  ?? Continue urea cream    # Diarrhea: Patient was reporing three to four watery bowel movements daily.  Patient is taking imodium intermittently with good response. No issues with diarrhea over the last week.     #  Decreasing plt count last reading of 136 on 08/20/17.  Stable today at last visit 3/4    # Forgetfulness- Patient's wife reports increased, intermittent forgetfulness with daily tasks.  Possible causes of forgetfulness discussed included, brain metastasis depressed mood due to seasonal depression, prognosis of disease, side effects of treatment, and strain of family and work relationships.  Patient encouraged to assess emotional and cognitive interventions. Medications for depression discussed (Zoloft), but patient declines at this time.  -- Brain MRI from 3/4 negative for any pathologic issues    # recent PNA resolved- patchy opacity remains on CT, but smaller than noted on previous imaging.     # malnutrition with associated wt loss: drinking supplements, intaking small, frequent meals. Reports wt of 135lbs this AM. This is up 1 lb from previous appt.     # COVID screening: patient denies fever/chills, unusual fatigue, no cough or SOB. He also denies travel or contact with any sick individuals. We reviewed best practices of social distancing and hand washing    Plan Summary:   ?? Continue Lenvatinib + everolimus  ?? Phone F/U in 2 weeks  ?? Labs locally in 1-2 weeks  ?? RTC in 1 month  ?? Continue nutritional supplements  ?? Continue urea cream BID  ?? Continue imodium as needed for diarrhea  ?? Avoid tylenol and ETOH  ?? Call with any issues         History of Present Illness:    Sergio Horton is a 68 y.o. male with h/o right nephrectomy and caval thrombectomy for ccRCC who is seen in follow-up for met ccRCC.    He originally presented in 2015 with a large right renal mass. He underwent nephrectomy in 06/2014 and pathology showed a 11.5cm grade 2 ccRCC with invasion into the perirenal adipose tissue (pT3a). He also had a retrocaval mass resected that also was ccRCC. Post-op course was complicated by respiratory failure requiring ECMO. He was in the hospital for a month after his surgery. Routine imaging demonstrated likely local recurrence and biopsy confirmed RCC. He initiated sunitinib on 07/27/16 and dose reduced 08/22/16 due to neutropenia, restarted 09/03/16, the dose reduced again for HFS. CT on 12/03/16 showed POD and sunitinib was stopped.  He transitioned to treatment via OMNIVORE trial (01/07/2017-06/24/2017), that utilized single agent nivolumab, until POD was noted, then he received 2 cycles of Ipi + Nivo. Repeat imaging reported POD.     Given POD on IO, he started Cabozantinib on 07/03/2017. To note he was started on 40mg  given significant liver disease, and was dose reduced to 20mg  after approx 6mon on treatment 2/2 persistent SE's including PPE, HTN, fatigue, appetite loss, and intermittent elevation in LFTs. He did well on 20mg , but most recent imaging from 06/2018 reported overall disease growth.     Lenvtinib + everolimus was recommended after imaging review in tumor board. He was able to start this regimen 07/24/2018.     Interval History:  Mr. Kinslow was assessed via phone today and he reports the following    Improvement in mood.     Bilateral swollen feet, likely multifactorial. He is trying to prop them up when he can    Diarrhea has resolved, he is using  imodium prn. He is trying to maintain good hydration with water and gatorade    Appetite is fair, but his weight is stable. He is drinking boost and ensure    His energy level remains low today, but likely multifactorial. He is trying to walk outside daily    No new pain    No blisters noted to hands/feet,  He is using urea cream           Past Medical History:  Past Medical History:   Diagnosis Date   ??? Cirrhosis of liver (CMS-HCC)    ??? Hypertension    ??? Hypothyroidism    ??? Renal cancer (CMS-HCC)    ??? Thrombocytopathia (CMS-HCC)    Alcoholic Cirrhosis - denies any ascites or h/o encephalopathy  HTN   No history of autoimmune disorder.    Medications:    Current Outpatient Medications on File Prior to Visit   Medication Sig Dispense Refill   ??? amLODIPine (NORVASC) 10 MG tablet Take 1 tablet (10 mg total) by mouth daily. 90 tablet 6   ??? B complex-vitamin C-folic acid (RENA-VITE) 0.8 mg Tab Take 1 tablet daily 30 tablet 12   ??? everolimus, antineoplastic, (AFINITOR) 5 mg tablet Take 1 tablet (5 mg total) by mouth daily. 30 tablet 3   ??? lenvatinib 18 mg/day (10 mg x 1-4 mg x2) cap Take one 10mg  capsule and two 4mg  capsules (18 mg total) by mouth daily. 360 capsule 3   ??? levothyroxine (SYNTHROID, LEVOTHROID) 112 MCG tablet TK 1 T PO QAM ON AN EMPTY STOMACH  1   ??? losartan (COZAAR) 25 MG tablet TAKE 1 TABLET BY MOUTH ONCE DAILY 90 tablet 3   ??? omeprazole (PRILOSEC) 20 MG capsule TAKE 1 CAPSULE BY MOUTH ONCE DAILY 90 capsule 0   ??? ondansetron (ZOFRAN-ODT) 8 MG disintegrating tablet Take 1 tablet (8 mg total) by mouth every eight (8) hours as needed for nausea. (Patient not taking: Reported on 09/25/2018) 30 tablet 1   ??? urea (CARMOL) 40 % cream Apply twice daily to sore areas on hands and feet 28 g 1   ??? [DISCONTINUED] lisinopril (PRINIVIL,ZESTRIL) 10 MG tablet Take 1 tablet (10 mg total) by mouth daily. 90 tablet 3     Current Facility-Administered Medications on File Prior to Visit   Medication Dose Route Frequency Provider Last Rate Last Dose   ??? [COMPLETED] iohexol (OMNIPAQUE) 240 mg iodine/mL oral solution 50 mL  50 mL Oral Once Kennieth Francois, FNP   50 mL at 10/07/18 1500   ??? [COMPLETED] iohexol (OMNIPAQUE) 350 mg iodine/mL solution 100 mL  100 mL Intravenous Once in imaging Kennieth Francois, FNP   100 mL at 10/07/18 1504     Allergies:  No Known Allergies    Social History:  Lives in [place] with wife. He has a h/o cirrhosis from alcohol use but does not currently use alcohol. No tobacco use. No other drugs. Works as a Financial risk analyst.    Review of Systems: A 10 system review was completed and was negative except per HPI.    Physical Exam:  There were no vitals filed for this visit.  ECOG PS 1  No physical exam- phone assessment    Labs: No labs from today     Imaging: reviewed today   05/25/16 CT CAP:  -- Status post right nephrectomy. -- Irregular soft tissue density foci with peripheral enhancement in the right nephrectomy bed and extending inferiorly along the psoas muscle, concerning  for recurrent malignancy.  -- Aortocaval lymphadenopathy.    07/04/16 Bone scan  -No evidence of metastatic osseous disease.    07/20/16 Echo:  ?? Normal left ventricular systolic function, ejection fraction > 55%  ?? Diastolic dysfunction - grade I (normal filling pressures)  ?? Normal right ventricular systolic function      09/24/16 CT CAP:  -- Status post right nephrectomy. There has been interval response to therapy, with decreasing size of recurrent tumor within the nephrectomy bed, and decreasing aortocaval nodal tissue.  -- Unchanged ultrasound morphology of liver compatible with history of cirrhosis.    12/03/16 CT CAP:  -Worsening metastatic disease in the right nephrectomy bed with enlarging aortocaval nodes/nodules and new perihepatic implant.    For reference pericaval nodes/nodules now measure up to 2 cm, previously 1.5 cm (2:93). There is new enhancing lesion in/along the right psoas musculature measuring up to 1.8 cm (2:99-106).     03/05/2017 CT CAP s/p 4C Nivo on Omnivore:  - Increase appearance of metastatic implants along the inferior right hepatic lobe. Increased size of nodularity in the right nephrectomy and increased pericaval node/nodule compatible with worsening metastatic disease.  --Increased size of pericaval nodes/nodules measure up to 2.4 cm (2:93), previously 2.0 cm  --Slightly increased size of nodularity in the right nephrectomy bed measuring 3.6 by 3.4 x 2.0 cm (2:90)  -Small-volume ascites.      06/24/2017 CT CAP  -Progressive disease with increasing nephrectomy bed soft tissue, adjacent retroperitoneal adenopathy, right psoas/paraspinal soft tissue implants, and posterior perihepatic implant. No evidence of metastatic disease in the chest.  -Small amount of nonspecific fluid/small collection adjacent to the ventral abdominal hernia sac on the right measuring 1.6 cm. ??This appears increased compared to 04/02/17 but similar to 03/05/2017.  ????-Mild circumferential bladder wall thickening which may be due to contraction versus cystitis. Recommend correlation with urinalysis.  -Cirrhotic liver morphology.    09/11/2017 CT CAP s/p 8 weeks Cabozantinib  1. Stable to slight interval decrease in necrotic metastatic implants in the right renal fossa and necrotic aortocaval adenopathy. Stable perihepatic implant.   **Centrally necrotic 2.4 x 2.1 cm mass abutting and likely invading the adjacent right psoas muscle. This is not significantly changed from prior examination which time it measured 2.5 x 2.3 cm on remeasurement.   **3.1 x 1.9 cm necrotic mass (1:37), previously previously 3.8 x 2.2 cm.   **Aortocaval adenopathy measures up to 2.6 cm in short axis, previously 2.8 cm (1:30). A more inferior aortocaval node measures 2.5 cm, previously 2.7 cm (1:44).   2. Small amount of fluid along the right lateral aspect of the small and large bowel containing ventral hernia, nonspecific. No evidence of obstruction at the hernia site.-- he has seen a general surgeon recently regarding possible repair  3. Cirrhotic liver with small caliber gastrohepatic and esophageal varices.  4. No evidence of thoracic metastatic disease.    12/03/2017 CT CAP on Cabozantinib  1. Stable to slight interval decrease in necrotic metastatic implants in the right renal fossa and necrotic aortocaval adenopathy. Stable perihepatic implant.  2. Small amount of fluid along the right lateral aspect of the small and large bowel containing ventral hernia, nonspecific. No evidence of obstruction at the hernia site.  3. Cirrhotic liver with small caliber gastrohepatic and esophageal varices.  4. No evidence of thoracic metastatic disease.    CT CAP 04/30/2018  IMPRESSION:  Since 02/26/2018:  -Mildly increased size of necrotic peritoneal implants and retroperitoneal nodes with invasion into  the right psoas and through the posterior abdominal wall and into the paraspinal musculature as described above.  -Focal small bowel thickening within the pelvis that is new. This may represent focal enteritis versus focal lesion. Close attention on follow-up.   -No other sites of disease identified.  -There is new small volume ascites which is likely related to cirrhosis and portal hypertension as evidenced by small caliber paraesophageal and upper abdominal varices.    Imaging reviewed from today; 07/09/2018; CT CAP  --Status post right nephrectomy.  ??  --Mild interval increase in size of right retroperitoneal metastatic implants as above.  ??  --Large anterior abdominal wall bowel-containing hernia with multiloculated rim enhancing pockets of fluid, some of them only slightly decreased but overall grossly unchanged from prior.    --LYMPH NODES: Interval progression of centrally necrotic extracapsular implant posterior to the hepatic segment 6, measuring 1.2 x 2.2 cm (previously 1.2 x 1.9 cm) (1:43).  Mildly increased right retroperitoneal centrally necrotic paraspinal and aortocaval metastases, as a reference:  -Right paraspinal lesion medial to the caudate lobe with invasion of the psoas muscle, intercostal muscle and paraspinal muscles, orbital measuring 6.8 x 2.9 cm (previously 6.0 x 2.6 cm) (1:38).   -Implant within the right psoas measures 1.8 x 2.2 cm (previously 1.7 x 2.4 cm).  -2.8 x 4.5 cm, aortocaval, previously 2.4 x 4.0 cm (1:55). The adjacent IVC is displaced laterally.    Pathology:    06/29/14:  Diagnosis:  A: ??Liver, segment 5, core needle biopsy  - Cirrhosis, etiology uncertain (see Light Microscopy) ??    B: ??Kidney and adrenal, right, radical nephrectomy, adrenalectomy, and caval  thrombectomy   Tumor histologic type/subtype: ??clear cell carcinoma  Sarcomatoid features:?????????? not identified   Histologic grade: Grade 2 (of 4) Fuhrman classification??????????  Tumor size: 11.5 cm diameter   Tumor focality: unifocal Extent of invasion:??????????  ???? ?? Extra-capsular invasion into perirenal adipose tissue: not identified  ???? ?? Gerota's fascia: not involved   ???? ?? Renal sinus: not involved ??????????  ???? ?? Major veins (renal vein or segmental branches, IVC): involved (macro and  micro)  ???? ?? Ureter: not involved   ???? ?? Venous: ??involved   ???? ?? Lymphatic:??????????not involved   ???? ??   Histologic assessment of surgical margins:??????????  ???? ?? Peri-nephric adipose tissue margin (partial nephrectomy only): negative   ???? ?? Gerota's fascia: negative   ???? ?? Renal vein margin:??????????no adherent carcinoma identified on initial or  recuts of en face margin (B2) ??  ???? ?? Ureter margin: ??negative   Adrenal gland:?????????? negative   Lymph nodes:??????????none received     Pathologic findings in non-neoplastic kidney: focal global glomerulosclerosis   AJCC Stage (renal primary): ??pT3b (intrahepatic caval involvement) ?? pNx ?? pMx    C: ??Mass, retrocaval, excision   - Renal clear cell carcinoma, partially encapsulated, presumed confined by  Gerota's fascia, extending to uninked tissue edges    06/27/16:   Right nephrectomy bed, core biopsy and touch preparation:  - Diagnostic of malignancy.  - Morphology consistent with recurrence of patient's known clear cell renal cell carcinoma (Grade 2 of 4 Fuhrman Nuclear Grade).     CT CAP 10/07/2018  small decrease in metastatic implants  LYMPH NODES: Extracapsular implant posterior to hepatic segment 6 measures 1.9 x 1.4 cm (1:42), previously 2.2 x 1.2 cm.  ??-Right paraspinal mass medial to the caudate lobe with indentation of the psoas muscle, intercostal muscle and paraspinal muscles, measures 6.3 x 1.8 cm (  1:37), previously 6.8 x 2.9 cm.  ??-Implant within the right psoas muscle measures 1.7 x 1.4 cm (1:45), previously 1.8 x 2.2 cm.  ??-Aortocaval node measures 4.2 x 2.9 cm (1:34), previously 2.8 x 4.5 cm. There is mass effect upon the IVC, which is laterally displaced.    GEN O M I C     V A R I A N T S  Somatic - Potentially Actionable Variant Allele Fraction  PTPRD            p.E1458* Stop gain - LOF 50.2%  AKT2  Copy number gain  CCND3 Copy number gain  VEGFA Copy number gain  Somatic - Biologically Relevant   ZFHX3            p.Q1181* Stop gain - LOF 34.8%  TFEB              Copy number gain  Germline - Pathogenic / Likely Pathogenic  No pathogenic variants were found in the limited set of genes on which we report  MSI stable

## 2018-10-16 MED ORDER — OMEPRAZOLE 20 MG CAPSULE,DELAYED RELEASE
ORAL_CAPSULE | Freq: Every day | ORAL | 0 refills | 0 days | Status: CP
Start: 2018-10-16 — End: ?

## 2018-10-17 NOTE — Unmapped (Signed)
[facility] Refill Coordination Note    Specialty Medication(s) to be Shipped:   Hematology/Oncology: Afinitor and [name]    Other medication(s) to be shipped: na     Sergio Horton, DOB: February 04, 1951  Phone: [phone] (home) [phone] (work)      All above HIPAA information was verified with patient.     Completed refill call assessment today to schedule patient's medication shipment from the [facility] [phone]).       Specialty medication(s) and dose(s) confirmed: Regimen is correct and unchanged.   Changes to medications: Tyreak reports no changes reported at this time.  Changes to insurance: No  Questions for the pharmacist: No    Confirmed patient received Welcome Packet with first shipment. The patient will receive a drug information handout for each medication shipped and additional FDA Medication Guides as required.       DISEASE/MEDICATION-SPECIFIC INFORMATION        N/A    SPECIALTY MEDICATION ADHERENCE     Medication Adherence    Patient reported X missed doses in the last month:  0  Specialty Medication:  lenvima  Patient is on additional specialty medications:  Yes  Additional Specialty Medications:  afinitor  Patient Reported Additional Medication X Missed Doses in the Last Month:  0  Patient is on more than two specialty medications:  No  Any gaps in refill history greater than 2 weeks in the last 3 months:  no  Demonstrates understanding of importance of adherence:  yes  Informant:  patient  Reliability of informant:  reliable  Adherence tools used:  medication list, directed education   Other adherence tool:  morning routine   Support network for adherence:  family member  Confirmed plan for next specialty medication refill:  delivery by pharmacy  Refills needed for supportive medications:  not needed          Refill Coordination    Has the Patients' Contact Information Changed:  No  Is the Shipping Address Different:  No           Afinitor 5mg  tabs. Patient has 7 days of medication on hand  Lenvima 18mg /day. Patient has 7 days supply remaining      SHIPPING     Shipping address confirmed in Epic.     Delivery Scheduled: Yes, Expected medication delivery date: 040220.     Medication will be delivered via UPS to the home address in Epic WAM.    Sergio Horton   [facility] Specialty Technician

## 2018-10-22 MED FILL — AFINITOR 5 MG TABLET: ORAL | 28 days supply | Qty: 28 | Fill #3

## 2018-10-22 MED FILL — LENVIMA 18 MG/DAY (10 MG X 1 AND 4 MG X 2) CAPSULE: ORAL | 30 days supply | Qty: 90 | Fill #3

## 2018-10-22 MED FILL — AFINITOR 5 MG TABLET: 28 days supply | Qty: 28 | Fill #3 | Status: AC

## 2018-10-22 MED FILL — LENVIMA 18 MG/DAY (10 MG X 1 AND 4 MG X 2) CAPSULE: 30 days supply | Qty: 90 | Fill #3 | Status: AC

## 2018-11-01 NOTE — Unmapped (Signed)
Spoke with pt and let him know that due to COVID 19 we are switching his appointment to a phone visit. He will get labs at a local [facility]. Will send orders.

## 2018-11-04 ENCOUNTER — Ambulatory Visit: Admit: 2018-11-04 | Discharge: 2018-11-05 | Payer: MEDICARE

## 2018-11-04 DIAGNOSIS — N179 Acute kidney failure, unspecified: Principal | ICD-10-CM

## 2018-11-04 LAB — CBC W/ DIFFERENTIAL
BANDED NEUTROPHILS ABSOLUTE COUNT: 0 10*3/uL (ref 0.0–0.1)
BASOPHILS ABSOLUTE COUNT: 0 10*3/uL (ref 0.0–0.2)
BASOPHILS RELATIVE PERCENT: 1 %
EOSINOPHILS ABSOLUTE COUNT: 0.2 10*3/uL (ref 0.0–0.4)
EOSINOPHILS RELATIVE PERCENT: 3 %
HEMOGLOBIN: 10.5 g/dL — ABNORMAL LOW (ref 13.0–17.7)
LYMPHOCYTES ABSOLUTE COUNT: 1.3 10*3/uL (ref 0.7–3.1)
LYMPHOCYTES RELATIVE PERCENT: 29 %
MEAN CORPUSCULAR HEMOGLOBIN CONC: 34 g/dL (ref 31.5–35.7)
MEAN CORPUSCULAR HEMOGLOBIN CONC: 34 g/dL — ABNORMAL LOW (ref 31.5–35.7)
MEAN CORPUSCULAR HEMOGLOBIN: 29.9 pg (ref 26.6–33.0)
MEAN CORPUSCULAR VOLUME: 88 fL (ref 79–97)
MONOCYTES ABSOLUTE COUNT: 0.7 10*3/uL (ref 0.1–0.9)
MONOCYTES RELATIVE PERCENT: 16 %
NEUTROPHILS ABSOLUTE COUNT: 2.3 10*3/uL (ref 1.4–7.0)
NEUTROPHILS RELATIVE PERCENT: 51 %
PLATELET COUNT: 232 10*3/uL (ref 150–450)
RED BLOOD CELL COUNT: 3.51 x10E6/uL — ABNORMAL LOW (ref 4.14–5.80)
RED CELL DISTRIBUTION WIDTH: 17.5 % — ABNORMAL HIGH (ref 11.6–15.4)
WHITE BLOOD CELL COUNT: 4.4 10*3/uL (ref 3.4–10.8)

## 2018-11-04 LAB — COMPREHENSIVE METABOLIC PANEL
A/G RATIO: 1.3 (ref 1.2–2.2)
ALBUMIN: 3.9 g/dL (ref 3.8–4.8)
ALKALINE PHOSPHATASE: 157 IU/L — ABNORMAL HIGH (ref 39–117)
AST (SGOT): 53 IU/L — ABNORMAL HIGH (ref 0–40)
BILIRUBIN TOTAL: <0.2 mg/dL (ref 0.0–1.2)
BLOOD UREA NITROGEN: 17 mg/dL (ref 8–27)
BUN / CREAT RATIO: 11 (ref 10–24)
CALCIUM: 8.9 mg/dL (ref 8.6–10.2)
CHLORIDE: 111 mmol/L — ABNORMAL HIGH (ref 96–106)
CO2: 15 mmol/L — ABNORMAL LOW (ref 20–29)
CREATININE: 1.5 mg/dL — ABNORMAL HIGH (ref 0.76–1.27)
GFR MDRD AF AMER: 54 mL/min/{1.73_m2} — ABNORMAL LOW
GFR MDRD NON AF AMER: 47 mL/min/{1.73_m2} — ABNORMAL LOW
GLUCOSE: 94 mg/dL — ABNORMAL HIGH (ref 65–99)
POTASSIUM: 4.1 mmol/L (ref 3.5–5.2)
SODIUM: 140 mmol/L (ref 134–144)
TOTAL PROTEIN: 7 g/dL (ref 6.0–8.5)

## 2018-11-04 NOTE — Unmapped (Signed)
Spoke with pt and let him know that his labs are back and creatinine is up and will need fluids. Instructed pt to come in to infusion clinic for fluids no later than 3:30pm. Per pt he has been having diarrhea. He is trying to find somebody to care for his wife while he comes and will let [name] know when she calls him. Infusion charge nurse aware.

## 2018-11-04 NOTE — Unmapped (Signed)
Hi,     Jasai contacted the [employer] requesting to speak with the care team of MARCELLO TUZZOLINO to discuss:    Hasn't had his phone appoinment yet theat is scheduled for 10.    Please contact at [phone].    Program: GU  Speciality: Medical Oncology    Check Indicates criteria has been reviewed and confirmed with the patient:    []  Preferred Name   [x]  DOB and/or MR#  [x]  Preferred Contact Method  [x]  Phone Number(s)   []  MyChart     Thank you,   Vernie Ammons  [facility]   [phone]

## 2018-11-05 ENCOUNTER — Ambulatory Visit: Admit: 2018-11-05 | Discharge: 2018-11-06 | Payer: MEDICARE

## 2018-11-05 ENCOUNTER — Institutional Professional Consult (permissible substitution): Admit: 2018-11-05 | Discharge: 2018-11-06 | Payer: MEDICARE | Attending: Family | Primary: Family

## 2018-11-05 MED ORDER — LOPERAMIDE 2 MG TABLET
ORAL_TABLET | Freq: Every day | ORAL | 3 refills | 0 days | Status: CP
Start: 2018-11-05 — End: 2019-01-08

## 2018-11-05 MED ORDER — ONDANSETRON 8 MG DISINTEGRATING TABLET
ORAL_TABLET | Freq: Two times a day (BID) | ORAL | 1 refills | 0 days | Status: CP
Start: 2018-11-05 — End: 2019-11-05

## 2018-11-05 MED ORDER — UREA 40 % TOPICAL CREAM
1 refills | 0 days | Status: CP
Start: 2018-11-05 — End: 2019-02-11

## 2018-11-05 NOTE — Unmapped (Signed)
Genitourinary Oncology Clinic Follow-Up Note     PHONE ENCOUNTER  Phone encounter visit was necessary based on COVID 21 outbreak to ensure patient safety    I spent 25 minutes on the phone with the patient. I spent an additional 15 minutes on pre- and post-visit activities.     The patient was physically located in [place] or a state in which I am permitted to provide care. The patient understood that s/he may incur co-pays and cost sharing, and agreed to the telemedicine visit. The visit was completed via phone and/or video, which was appropriate and reasonable under the circumstances given the patient's presentation at the time.    The patient has been advised of the potential risks and limitations of this mode of treatment (including, but not limited to, the absence of in-person examination) and has agreed to be treated using telemedicine. The patient's/patient's family's questions regarding telemedicine have been answered.     If the phone/video visit was completed in an ambulatory setting, the patient has also been advised to contact their provider???s office for worsening conditions, and seek emergency medical treatment and/or call 911 if the patient deems either necessary.    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 He has progressed through Sunitnib, Nivolumab + Ipilimumab via OMNIVORE clinical trial, Cabozantinib, and is now on treatment with Lenvatinib + everolimus.        1. ccRCC: on treatment with Lenvtinib + Everolimus (1/2/20120-present)    ?? Previous Treatments: Sunitnib (07/27/2016-12/03/2016), Nivolumab + Ipilimumab + Nivo via OMNIVORE clinical trial (01/21/2017-05/29/2017), and Cabozantinib (07/08/2017-07/22/2018)  ?? Tempus testing results below   ?? Restarted Lenvatinib + everolimus on 10/01/2018 after holding x 10days 2/2 PNA requiring hospitalization   ?? Discussion: most recent imaging from 10/07/2018 with overall decrease in all areas including adenopathy, hepatic lesions, and mass implants. Given overall reduction and increasing tolerability of medication he will continue on treatment. Original plan was to repeat imaging in 8 weeks, but will likely push out to 12 weeks given his good response, clinical picture, in the setting of COVID19.  We also discussed possible dose reduction in lenvatinib if SE's do not improve (specifically emesis, fatigue, and diarrhea)    # Cirrhosis: Has intermittent baseline leukopenia and thrombocytopia, although reports no episodes of decompensated cirrhosis and currently in normal range. He had a mild elevation at his last visit but not significant.   ?? Avoids tylenol  ?? Denies current alcohol use    # HTN: better controled. Currently on amlodipine 10mg  and losartan 25mg .  ?? Home readings 130-140's/80's  ?? Discontinued lisinopril previously due to cough     # Asymmetric LE edema: No DVT per recent doppler. Today he reports swelling in bilateral ankles and feet, however, this is not new. We reviewed compression socks and elevation. Swelling may be related to everolimus, but likely multifactorial including malnutrition, and recent hospitalization      # Elevated TSH: On synthroid, follows with PCP    # Ventral hernia: easily reduces. Advised wearing abdominal binder if is becomes bothersome. He has met with  Dr. Carlynn Purl and will follow up with her after imaging has been completed in October. Given disease growth, I would depolarize  this at this time.       # Baseline Elevated sCr: Noted to be 1.5 on 4/13 labs. Given hyperchloremic acidosis likely r/t diarrhea. 2 L NS were administered yesterday.     # GERD: prilosec    # Health Maintenance: Influenza given 04/2018    #. PPE - no blisters today  ?? Continue urea cream    # Diarrhea: patient was taking imodium, but then became constipated. Took several laxatives last week, that pushed him in the opposite direction, and once again he had several bouts of diarrhea over the weekend. He reports soft stools, however, he also endorses having 4-5 BM's daily. He will try taking 1 2mg  imodium tablet every morning and we will follow closely to help him titrate the dose.     #  Decreasing plt count last reading of 136 on 08/20/17.  Stable today on 4/13 labs; 232    # Forgetfulness- Patient's wife reports increased, intermittent forgetfulness with daily tasks. Possible causes of forgetfulness discussed included, brain metastasis depressed mood due to seasonal depression, prognosis of disease, side effects of treatment, and strain of family and work relationships.  Patient encouraged to assess emotional and cognitive interventions. Medications for depression discussed (Zoloft), but patient declines at this time.  -- Brain MRI from 3/4 negative for any pathologic issues    # hx PNA in Feb 2020 resolved- patchy opacity remains on CT, but smaller than noted on previous imaging. Continues to report feeling poorly since his hospitalization.    # malnutrition with associated wt loss: drinking supplements, intaking small, frequent meals. Reports wt of 135lbs this AM. This is up 1 lb from previous appt.     #nausea with intermittent associated emesis: currently taking pepto and prilosec. Will discontinue pepto and take zofran scheduled every 12 hours. Will increase to every 8 if needed.     # decreasing Hgb: recent decrease in Hgb by 2 pts over 5 weeks. No s/s of bleeding per patient. Will monitor closely    Plan Summary:   ?? Continue Lenvatinib + everolimus- discussed possible dose reduction if SE's do not improve  ?? Diarrhea: imodium 1 tablet every morning  ?? Nausea: Zofran 8mg  every 12 hours scheduled, with plan to increase if needed to every 8 hours  ?? Will follow up via phone with pharmacist, Katina Degree, on Monday 4/20.        History of Present Illness:    OSRIC KLOPF is a 68 y.o. male with h/o right nephrectomy and caval thrombectomy for ccRCC who is seen in follow-up for met ccRCC.    He originally presented in 2015 with a large right renal mass. He underwent nephrectomy in 06/2014 and pathology showed a 11.5cm grade 2 ccRCC with invasion into the perirenal adipose tissue (pT3a). He also had a retrocaval mass resected that also was ccRCC. Post-op course was complicated by respiratory failure requiring ECMO. He was in the hospital for a month after his surgery. Routine imaging demonstrated likely local recurrence and biopsy confirmed RCC. He initiated sunitinib on 07/27/16 and dose reduced 08/22/16 due to neutropenia, restarted 09/03/16, the dose reduced again for HFS. CT on 12/03/16 showed POD and sunitinib was stopped.  He transitioned to treatment via OMNIVORE trial (01/07/2017-06/24/2017), that utilized single agent nivolumab, until POD was noted, then he received 2 cycles of Ipi + Nivo. Repeat imaging reported POD.     Given POD on IO, he started Cabozantinib on 07/03/2017. To note he was started on  40mg  given significant liver disease, and was dose reduced to 20mg  after approx 6mon on treatment 2/2 persistent SE's including PPE, HTN, fatigue, appetite loss, and intermittent elevation in LFTs. He did well on 20mg , but most recent imaging from 06/2018 reported overall disease growth.     Lenvtinib + everolimus was recommended after imaging review in tumor board. He was able to start this regimen 07/24/2018.     Interval History:  Mr. Jantz was assessed via phone today and he reports the following    PND related to allergies    Fatigue and feeling cold all the time     Bilateral swollen feet, likely multifactorial. He is trying to prop them up when he can    4-5 semi solid BM's/day- not taking imodium any longer as he was constipated early last week    Appetite is poor, endorses nausea, taking pepto and prilosec    No new pain    No blisters noted to hands/feet,  He is using urea cream     Depressed mood. Trying to get outside daily, stressed as he cares for his wife who is not in good health. He will consider zoloft          Past Medical History:  Past Medical History:   Diagnosis Date   ??? Cirrhosis of liver (CMS-HCC)    ??? Hypertension    ??? Hypothyroidism    ??? Renal cancer (CMS-HCC)    ??? Thrombocytopathia (CMS-HCC)    Alcoholic Cirrhosis - denies any ascites or h/o encephalopathy  HTN   No history of autoimmune disorder.    Medications:  Reviewed in EPIC  Allergies:  No Known Allergies    Social History:  Lives in [place] with wife. He has a h/o cirrhosis from alcohol use but does not currently use alcohol. No tobacco use. No other drugs. Works as a Financial risk analyst.    Review of Systems: A 10 system review was completed and was negative except per HPI.    Physical Exam:  VS from yesterday reviewed in Epic  ECOG PS 1  No physical exam- phone assessment    Labs: labs reviewed from 4/13    Imaging:   05/25/16 CT CAP:  -- Status post right nephrectomy.  -- Irregular soft tissue density foci with peripheral enhancement in the right nephrectomy bed and extending inferiorly along the psoas muscle, concerning for recurrent malignancy.  -- Aortocaval lymphadenopathy.    07/04/16 Bone scan  -No evidence of metastatic osseous disease.    07/20/16 Echo:  ?? Normal left ventricular systolic function, ejection fraction > 55%  ?? Diastolic dysfunction - grade I (normal filling pressures)  ?? Normal right ventricular systolic function      09/24/16 CT CAP:  -- Status post right nephrectomy. There has been interval response to therapy, with decreasing size of recurrent tumor within the nephrectomy bed, and decreasing aortocaval nodal tissue.  -- Unchanged ultrasound morphology of liver compatible with history of cirrhosis.    12/03/16 CT CAP:  -Worsening metastatic disease in the right nephrectomy bed with enlarging aortocaval nodes/nodules and new perihepatic implant.    For reference pericaval nodes/nodules now measure up to 2 cm, previously 1.5 cm (2:93). There is new enhancing lesion in/along the right psoas musculature measuring up to 1.8 cm (2:99-106).     03/05/2017 CT CAP s/p 4C Nivo on Omnivore:  - Increase appearance of metastatic implants along the inferior right hepatic lobe. Increased size of nodularity in the right nephrectomy and  increased pericaval node/nodule compatible with worsening metastatic disease.  --Increased size of pericaval nodes/nodules measure up to 2.4 cm (2:93), previously 2.0 cm  --Slightly increased size of nodularity in the right nephrectomy bed measuring 3.6 by 3.4 x 2.0 cm (2:90)  -Small-volume ascites.      06/24/2017 CT CAP  -Progressive disease with increasing nephrectomy bed soft tissue, adjacent retroperitoneal adenopathy, right psoas/paraspinal soft tissue implants, and posterior perihepatic implant. No evidence of metastatic disease in the chest.  -Small amount of nonspecific fluid/small collection adjacent to the ventral abdominal hernia sac on the right measuring 1.6 cm. ??This appears increased compared to 04/02/17 but similar to 03/05/2017.  ????-Mild circumferential bladder wall thickening which may be due to contraction versus cystitis. Recommend correlation with urinalysis.  -Cirrhotic liver morphology.    09/11/2017 CT CAP s/p 8 weeks Cabozantinib  1. Stable to slight interval decrease in necrotic metastatic implants in the right renal fossa and necrotic aortocaval adenopathy. Stable perihepatic implant.   **Centrally necrotic 2.4 x 2.1 cm mass abutting and likely invading the adjacent right psoas muscle. This is not significantly changed from prior examination which time it measured 2.5 x 2.3 cm on remeasurement.   **3.1 x 1.9 cm necrotic mass (1:37), previously previously 3.8 x 2.2 cm.   **Aortocaval adenopathy measures up to 2.6 cm in short axis, previously 2.8 cm (1:30). A more inferior aortocaval node measures 2.5 cm, previously 2.7 cm (1:44).   2. Small amount of fluid along the right lateral aspect of the small and large bowel containing ventral hernia, nonspecific. No evidence of obstruction at the hernia site.-- he has seen a general surgeon recently regarding possible repair  3. Cirrhotic liver with small caliber gastrohepatic and esophageal varices.  4. No evidence of thoracic metastatic disease.    12/03/2017 CT CAP on Cabozantinib  1. Stable to slight interval decrease in necrotic metastatic implants in the right renal fossa and necrotic aortocaval adenopathy. Stable perihepatic implant.  2. Small amount of fluid along the right lateral aspect of the small and large bowel containing ventral hernia, nonspecific. No evidence of obstruction at the hernia site.  3. Cirrhotic liver with small caliber gastrohepatic and esophageal varices.  4. No evidence of thoracic metastatic disease.    CT CAP 04/30/2018  IMPRESSION:  Since 02/26/2018:  -Mildly increased size of necrotic peritoneal implants and retroperitoneal nodes with invasion into the right psoas and through the posterior abdominal wall and into the paraspinal musculature as described above.  -Focal small bowel thickening within the pelvis that is new. This may represent focal enteritis versus focal lesion. Close attention on follow-up.   -No other sites of disease identified.  -There is new small volume ascites which is likely related to cirrhosis and portal hypertension as evidenced by small caliber paraesophageal and upper abdominal varices.    Imaging reviewed from today; 07/09/2018; CT CAP  --Status post right nephrectomy.  ??  --Mild interval increase in size of right retroperitoneal metastatic implants as above.  ??  --Large anterior abdominal wall bowel-containing hernia with multiloculated rim enhancing pockets of fluid, some of them only slightly decreased but overall grossly unchanged from prior.    --LYMPH NODES: Interval progression of centrally necrotic extracapsular implant posterior to the hepatic segment 6, measuring 1.2 x 2.2 cm (previously 1.2 x 1.9 cm) (1:43).  Mildly increased right retroperitoneal centrally necrotic paraspinal and aortocaval metastases, as a reference:  -Right paraspinal lesion medial to the caudate lobe with invasion of the psoas muscle, intercostal  muscle and paraspinal muscles, orbital measuring 6.8 x 2.9 cm (previously 6.0 x 2.6 cm) (1:38).   -Implant within the right psoas measures 1.8 x 2.2 cm (previously 1.7 x 2.4 cm).  -2.8 x 4.5 cm, aortocaval, previously 2.4 x 4.0 cm (1:55). The adjacent IVC is displaced laterally.    Pathology:    06/29/14:  Diagnosis:  A: ??Liver, segment 5, core needle biopsy  - Cirrhosis, etiology uncertain (see Light Microscopy) ??    B: ??Kidney and adrenal, right, radical nephrectomy, adrenalectomy, and caval  thrombectomy   Tumor histologic type/subtype: ??clear cell carcinoma  Sarcomatoid features:?????????? not identified   Histologic grade: Grade 2 (of 4) Fuhrman classification??????????  Tumor size: 11.5 cm diameter   Tumor focality: unifocal     Extent of invasion:??????????  ???? ?? Extra-capsular invasion into perirenal adipose tissue: not identified  ???? ?? Gerota's fascia: not involved   ???? ?? Renal sinus: not involved ??????????  ???? ?? Major veins (renal vein or segmental branches, IVC): involved (macro and  micro)  ???? ?? Ureter: not involved   ???? ?? Venous: ??involved   ???? ?? Lymphatic:??????????not involved   ???? ??   Histologic assessment of surgical margins:??????????  ???? ?? Peri-nephric adipose tissue margin (partial nephrectomy only): negative   ???? ?? Gerota's fascia: negative   ???? ?? Renal vein margin:??????????no adherent carcinoma identified on initial or  recuts of en face margin (B2) ??  ???? ?? Ureter margin: ??negative   Adrenal gland:?????????? negative   Lymph nodes:??????????none received     Pathologic findings in non-neoplastic kidney: focal global glomerulosclerosis   AJCC Stage (renal primary): ??pT3b (intrahepatic caval involvement) ?? pNx ?? pMx    C: ??Mass, retrocaval, excision   - Renal clear cell carcinoma, partially encapsulated, presumed confined by  Gerota's fascia, extending to uninked tissue edges    06/27/16:   Right nephrectomy bed, core biopsy and touch preparation:  - Diagnostic of malignancy.  - Morphology consistent with recurrence of patient's known clear cell renal cell carcinoma (Grade 2 of 4 Fuhrman Nuclear Grade).     CT CAP 10/07/2018  small decrease in metastatic implants  LYMPH NODES: Extracapsular implant posterior to hepatic segment 6 measures 1.9 x 1.4 cm (1:42), previously 2.2 x 1.2 cm.  ??-Right paraspinal mass medial to the caudate lobe with indentation of the psoas muscle, intercostal muscle and paraspinal muscles, measures 6.3 x 1.8 cm (1:37), previously 6.8 x 2.9 cm.  ??-Implant within the right psoas muscle measures 1.7 x 1.4 cm (1:45), previously 1.8 x 2.2 cm.  ??-Aortocaval node measures 4.2 x 2.9 cm (1:34), previously 2.8 x 4.5 cm. There is mass effect upon the IVC, which is laterally displaced.    GEN O M I C     V A R I A N T S  Somatic - Potentially Actionable Variant Allele Fraction  PTPRD            p.E1458* Stop gain - LOF 50.2%  AKT2  Copy number gain  CCND3 Copy number gain  VEGFA Copy number gain  Somatic - Biologically Relevant   ZFHX3            p.Q1181* Stop gain - LOF 34.8%  TFEB              Copy number gain  Germline - Pathogenic / Likely Pathogenic  No pathogenic variants were found in the limited set of genes on which we report  MSI stable

## 2018-11-05 NOTE — Unmapped (Signed)
Pt here for fluid hydration,  Treatment complete tolerated well.  Pt left no concerns

## 2018-11-05 NOTE — Unmapped (Addendum)
Plan:  Phone follow up on Monday to eval nausea and diarrhea.  Prescriptions for Zofan and imodium sent to patient's pharmacy.   Urea cream renewed  Will repeat labs locally in 2 weeks

## 2018-11-05 NOTE — Unmapped (Signed)
If you feel like this is an emergency please call 911.  For appointments or questions Monday through Friday 8AM-5PM please call [phone] or Toll Free [phone]. For Medical questions or concerns ask for the Nurse Triage Line.  On Nights, Weekends, and Holidays call [phone] and ask for the Oncologist on Call.  Reasons to call the Nurse Triage Line:  Fever of 100.5 or greater  Nausea and/or vomiting not relived with nausea medicine  Diarrhea or constipation  Severe pain not relieved with usual pain regimen  Shortness of breath  Uncontrolled bleeding  Mental status changes  No visits with results within 1 Day(s) from this visit.   Latest known visit with results is:   Telephone on 10/31/2018   Component Date Value Ref Range Status   ??? WBC 11/03/2018 4.4  3.4 - 10.8 x10E3/uL Final   ??? RBC 11/03/2018 3.51* 4.14 - 5.80 x10E6/uL Final   ??? HGB 11/03/2018 10.5* 13.0 - 17.7 g/dL Final   ??? HCT 57/84/6962 30.9* 37.5 - 51.0 % Final   ??? MCV 11/03/2018 88  79 - 97 fL Final   ??? MCH 11/03/2018 29.9  26.6 - 33.0 pg Final   ??? MCHC 11/03/2018 34.0  31.5 - 35.7 g/dL Final   ??? RDW 95/28/4132 17.5* 11.6 - 15.4 % Final   ??? Platelet 11/03/2018 232  150 - 450 x10E3/uL Final   ??? Neutrophils % 11/03/2018 51  Not Estab. % Final   ??? Lymphocytes % 11/03/2018 29  Not Estab. % Final   ??? Monocytes % 11/03/2018 16  Not Estab. % Final   ??? Eosinophils % 11/03/2018 3  Not Estab. % Final   ??? Basophils % 11/03/2018 1  Not Estab. % Final   ??? Absolute Neutrophils 11/03/2018 2.3  1.4 - 7.0 x10E3/uL Final   ??? Absolute Lymphocytes 11/03/2018 1.3  0.7 - 3.1 x10E3/uL Final   ??? Absolute Monocytes  11/03/2018 0.7  0.1 - 0.9 x10E3/uL Final   ??? Absolute Eosinophils 11/03/2018 0.2  0.0 - 0.4 x10E3/uL Final   ??? Absolute Basophils  11/03/2018 0.0  0.0 - 0.2 x10E3/uL Final   ??? Immature Granulocytes 11/03/2018 0  Not Estab. % Final   ??? Bands Absolute 11/03/2018 0.0  0.0 - 0.1 x10E3/uL Final   ??? Glucose 11/03/2018 94  65 - 99 mg/dL Final   ??? BUN 44/07/270 17  8 - 27 mg/dL Final   ??? Creatinine 11/03/2018 1.50* 0.76 - 1.27 mg/dL Final   ??? GFR MDRD Non Af Amer 11/03/2018 47* >59 mL/min/1.73 Final   ??? GFR MDRD Af Amer 11/03/2018 54* >59 mL/min/1.73 Final   ??? BUN/Creatinine Ratio 11/03/2018 11  10 - 24 Final   ??? Sodium 11/03/2018 140  134 - 144 mmol/L Final   ??? Potassium 11/03/2018 4.1  3.5 - 5.2 mmol/L Final   ??? Chloride 11/03/2018 111* 96 - 106 mmol/L Final   ??? CO2 11/03/2018 15* 20 - 29 mmol/L Final    **Verified by repeat analysis**   ??? Calcium 11/03/2018 8.9  8.6 - 10.2 mg/dL Final   ??? Total Protein 11/03/2018 7.0  6.0 - 8.5 g/dL Final   ??? Albumin 53/66/4403 3.9  3.8 - 4.8 g/dL Final   ??? Globulin, Total 11/03/2018 3.1  1.5 - 4.5 g/dL Final   ??? A/G Ratio 47/42/5956 1.3  1.2 - 2.2 Final   ??? Total Bilirubin 11/03/2018 <0.2  0.0 - 1.2 mg/dL Final   ??? Alkaline Phosphatase 11/03/2018 157* 39 - 117 IU/L Final   ???  AST 11/03/2018 53* 0 - 40 IU/L Final   ??? ALT 11/03/2018 34  0 - 44 IU/L Final

## 2018-11-10 NOTE — Unmapped (Signed)
Clinical Pharmacist Practitioner: [facility]    Patient Name: Sergio Horton  Patient Age: 68 y.o.    Assessment and recommendations:    1. ccRCC. On treatment with lenvatinib+ everolimus (07/24/2018-present)  - Continue lenvatinib 18 mg daily and everolimus 5 mg daily    2. Diarrhea. Grade 2 diarrhea (3-5 unformed stools per day) for the past week. Scr 1.5 on 4/13 (b/l 1.1). Taking loperamide once or twice daily and PeptoBismal as needed.  - Increase frequency of loperamide dosing to 1 tablet/capsule (2 mg) after each loose stool (max 8 doses a day (16 mg))  - Limit use of PeptoBismal as patient has a history of low platelet count  - F/u in 2 days. Consider adding Lomotil    3. PPES. Feet blisters are resolving. No blisters on hands. Only applying urea cream once daily.  - Start applying urea cream to hands and feet twice daily  - Continue to monitor for symptom resoltution    4. HTN. Current antihypertensive therapy includes amlodipine 10 mg and losartan 25 mg. Blood pressures are controlled (most recent home measurements 120-125/79-80). Previously on lisinopril but developed a cough.   - Continue current doses of amlodipine and losartan     5. Nausea. No nausea reported in the past week. Has not taken any ondansetron since early last week.  - Change scheduled ondansetron to 8 mg PRN every 8 h    6. Depressed mood. Did not discuss during phone call; however, per [name] note on 4/15: Trying to get outside daily, stressed as he cares for his wife who is not in good health. He will consider zoloft  - Consider starting sertraline next follow up    Follow- up: 4/22 with Katina Degree (monitor diarrhea and assess need for starting sertraline)  ______________________________________________________________________    Reason for visit:  Lenvatinib + everolimus monitoring and side effect management    Current Drug and Dose: lenvatinib 18 mg and everolimus 5 mg daily    Date of Initiation: 10/01/18    Oral Agent Toxicities:  Grade 1 Palmer-plantar erythrodysesthesia: minimal skin changes or dermatitis (erythema, edema, hyperkeratosis) without pain and Grade 2 Diarrhea: increase of 4-6 stools per day over baseline, moderate increase in ostomy output compared to baseline    HPI:  68 y.o.M with alcoholic cirrhosis, HTN, and h/o pT3a ccRCC s/p 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 He has progressed through Sunitnib, Nivolumab + Ipilimumab via OMNIVORE clinical trial, Cabozantinib, and is now on treatment with Lenvatinib + everolimus    Interval History:  Sergio Horton reports feeling discouraged today. He reports having diarrhea, blisters on his heels, and poor fluid intake. The diarrhea has been frequent; 4-5 not fully formed stools/ day last week; yesterday he had episodes. He takes loperamide once or twice daily and PeptoBismal PRN. His blisters on heels are improving with use of urea 40% cream; he applies the cream to his hands and feet once a day in the morning. Concerning poor fluid intake, he admits to not drinking a lot of fluids and says he's had some mild abdominal pain. He takes omeprazole for GERD. He reports that his blood pressures last night and this morning were 125/79 and 120/80, respectively. He denies nausea and vomiting, s/sx of systemic infection, swelling, headache, mucositis, and otherwise has no complaints.    Adherence: No missed doses. He takes everolimis and lenvatinib on an empty stomach in the morning.     Drug-Drug Interactions: None identified    Medications:  Current  Outpatient Medications   Medication Sig Dispense Refill   ??? amLODIPine (NORVASC) 10 MG tablet Take 1 tablet (10 mg total) by mouth daily. 90 tablet 6   ??? B complex-vitamin C-folic acid (RENA-VITE) 0.8 mg Tab Take 1 tablet daily 30 tablet 12   ??? everolimus, antineoplastic, (AFINITOR) 5 mg tablet Take 1 tablet (5 mg total) by mouth daily. 30 tablet 3   ??? lenvatinib 18 mg/day (10 mg x 1-4 mg x2) cap Take one 10mg  capsule and two 4mg  capsules (18 mg total) by mouth daily. 360 capsule 3   ??? levothyroxine (SYNTHROID, LEVOTHROID) 112 MCG tablet TK 1 T PO QAM ON AN EMPTY STOMACH  1   ??? loperamide (IMODIUM A-D) 2 mg tablet Take 1 tablet (2 mg total) by mouth daily. 30 tablet 3   ??? losartan (COZAAR) 25 MG tablet TAKE 1 TABLET BY MOUTH ONCE DAILY 90 tablet 3   ??? omeprazole (PRILOSEC) 20 MG capsule Take 1 capsule (20 mg total) by mouth daily. 90 capsule 0   ??? ondansetron (ZOFRAN-ODT) 8 MG disintegrating tablet Take 1 tablet (8 mg total) by mouth every twelve (12) hours. 90 tablet 1   ??? urea (CARMOL) 40 % cream Apply twice daily to sore areas on hands and feet 28 g 1     No current facility-administered medications for this visit.        LABS:  Lab Results   Component Value Date    WBC 4.4 11/03/2018    HGB 10.5 (L) 11/03/2018    HCT 30.9 (L) 11/03/2018    PLT 232 11/03/2018       Lab Results   Component Value Date    NA 140 11/03/2018    K 4.1 11/03/2018    CL 111 (H) 11/03/2018    CO2 15 (L) 11/03/2018    BUN 17 11/03/2018    CREATININE 1.50 (H) 11/03/2018    GLU 90 09/25/2018    CALCIUM 8.9 11/03/2018    MG 1.4 (L) 08/20/2018    PHOS 3.5 07/19/2014       Lab Results   Component Value Date    BILITOT <0.2 11/03/2018    BILIDIR 0.20 09/25/2018    PROT 7.0 11/03/2018    ALBUMIN 3.0 (L) 09/25/2018    ALT 34 11/03/2018    AST 53 (H) 11/03/2018    ALKPHOS 157 (H) 11/03/2018     I spent 15 minutes in direct patient care.    Daleen Snook, PharmD  PGY2 Hematology/Oncology Pharmacy Resident  Pager: [phone]    I was the precepting pharmacist in the delivery of services. I agree with the plan as documented.    Laverna Peace PharmD, BCOP, CPP  Hematology/Oncology Pharmacist  P: [phone]    The patient was physically located in [place] or a state in which I am permitted to provide care. The patient understood that s/he may incur co-pays and cost sharing, and agreed to the telemedicine visit. The visit was completed via phone and/or video, which was appropriate and reasonable under the circumstances given the patient's presentation at the time.    The patient has been advised of the potential risks and limitations of this mode of treatment (including, but not limited to, the absence of in-person examination) and has agreed to be treated using telemedicine. The patient's/patient's family's questions regarding telemedicine have been answered.     If the phone/video visit was completed in an ambulatory setting, the patient has also been advised to  contact their provider???s office for worsening conditions, and seek emergency medical treatment and/or call 911 if the patient deems either necessary.

## 2018-11-11 NOTE — Unmapped (Signed)
LM to notify pt of upcoming appts scheduled for 5/6 & 5/20 with NP Brower.    Sergio Horton

## 2018-11-12 NOTE — Unmapped (Signed)
Clinical Pharmacist Practitioner: [facility]  ??  Patient Name: Sergio Horton  Patient Age: 68 y.o.  ??  Assessment and recommendations:  ??  1. ccRCC- On treatment with lenvatinib+ everolimus (07/24/2018-present)  - Continue lenvatinib 18 mg daily and everolimus 5 mg daily  ??  2. Diarrhea- Grade 1 diarrhea (3-5 unformed stools per day) for the past week. Scr 1.5 on 4/13 (b/l 1.1). Taking loperamide after each loose stool. Stopped taking PeptoBismal.  - Start diphenoxylate-atropine 2.5 mg-0.025 mg PO BID. This is a Beers Criteria medication; monitor for anticholinergic side effects.  - Continue loperamide 2 mg after each loose stool. Hold medication if constipated.  ??  3. HFS- Feet blisters are resolving. No blisters on hands. Only applying urea cream once daily.  - Continue applying urea cream to hands and feet twice daily  - Continue to monitor for symptom resoltution  ??  4. HTN. Current antihypertensive therapy includes amlodipine 10 mg and losartan 25 mg. Blood pressures are controlled (most recent home measurements 120-125/79-80). Previously on lisinopril but developed a cough.   - Continue current doses of amlodipine and losartan     5. Nausea. No nausea reported in the past week. Has not taken any ondansetron since early last week.  - Continue ondansetron PRN   ??  6. Depressed mood. Did not discuss during phone call; however, per Sergio Horton's note on 4/15: Trying to get outside daily, stressed as he cares for his wife who is not in good health. He will consider zoloft. He was not interested in starting sertraline today.   - Consider starting sertraline next follow up  ??  Follow- up: 4/29 with Sergio Horton (monitor diarrhea and assess need for starting sertraline)  ______________________________________________________________________  ??  Reason for visit:  Lenvatinib + everolimus monitoring and side effect management  ??  Current Drug and Dose: lenvatinib 18 mg and everolimus 5 mg daily  ??  Date of Initiation: 10/01/18  ??  Oral Agent Toxicities:  Grade 1 Palmer-plantar erythrodysesthesia: minimal skin changes or dermatitis (erythema, edema, hyperkeratosis) without pain and Grade 1 Diarrhea: increase of <4 stools per day over baseline  ??  HPI:  68 y.o.M with alcoholic cirrhosis, HTN, and h/o pT3a ccRCC s/p 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 He has progressed through Sunitnib, Nivolumab + Ipilimumab via OMNIVORE clinical trial, Cabozantinib, and is now on treatment with Lenvatinib + everolimus  ??  Interval History:  Sergio Horton continues to have issues with diarrhea. He reports having loose stools 3-4 times a day and says they are less watery than those from last week. He is using loperamide 2 mg after each stool and stopped taking the PeptoBismal on 4/20. He is doing his best to improve his fluid intake. PPES is improving with urea cream. He denies nausea and vomiting, s/sx of systemic infection, swelling, headache, mucositis, and otherwise has no complaints.  ??  Adherence: No missed doses. He takes everolimis and lenvatinib on an empty stomach in the morning.   ??  Drug-Drug Interactions: None identified    I spent 10 minutes in direct patient care.    Daleen Snook, PharmD  PGY2 Hematology/Oncology Pharmacy Resident  Pager: [phone]    I was the precepting pharmacist in the delivery of services. I agree with the plan as documented.    Laverna Peace PharmD, BCOP, CPP  Hematology/Oncology Pharmacist  P: [phone]    The patient was physically located in [place] or a state in which I am  permitted to provide care. The patient understood that s/he may incur co-pays and cost sharing, and agreed to the telemedicine visit. The visit was completed via phone and/or video, which was appropriate and reasonable under the circumstances given the patient's presentation at the time.    The patient has been advised of the potential risks and limitations of this mode of treatment (including, but not limited to, the absence of in-person examination) and has agreed to be treated using telemedicine. The patient's/patient's family's questions regarding telemedicine have been answered.     If the phone/video visit was completed in an ambulatory setting, the patient has also been advised to contact their provider???s office for worsening conditions, and seek emergency medical treatment and/or call 911 if the patient deems either necessary.

## 2018-11-13 MED ORDER — DIPHENOXYLATE-ATROPINE 2.5 MG-0.025 MG TABLET
ORAL_TABLET | Freq: Two times a day (BID) | ORAL | 1 refills | 0 days | Status: CP | PRN
Start: 2018-11-13 — End: 2019-02-02

## 2018-11-13 NOTE — Unmapped (Signed)
[facility] Clinical Assessment & Refill Coordination Note    Sergio Horton, DOB: [birth year]  Phone: [phone] (home) [phone] (work)    All above HIPAA information was verified with patient.     Specialty Medication(s):   Hematology/Oncology: Afinitor and Lenvima     Current Outpatient Medications   Medication Sig Dispense Refill   ??? amLODIPine (NORVASC) 10 MG tablet Take 1 tablet (10 mg total) by mouth daily. 90 tablet 6   ??? B complex-vitamin C-folic acid (RENA-VITE) 0.8 mg Tab Take 1 tablet daily 30 tablet 12   ??? bismuth subsalicylate (PEPTO BISMOL) 262 mg/15 mL suspension Take 15 mL by mouth every six (6) hours as needed for indigestion.     ??? diphenoxylate-atropine (LOMOTIL) 2.5-0.025 mg per tablet Take 1 tablet by mouth two (2) times a day as needed for diarrhea. 60 tablet 1   ??? everolimus, antineoplastic, (AFINITOR) 5 mg tablet Take 1 tablet (5 mg total) by mouth daily. 30 tablet 3   ??? lenvatinib 18 mg/day (10 mg x 1-4 mg x2) cap Take one 10mg  capsule and two 4mg  capsules (18 mg total) by mouth daily. 360 capsule 3   ??? levothyroxine (SYNTHROID, LEVOTHROID) 112 MCG tablet TK 1 T PO QAM ON AN EMPTY STOMACH  1   ??? loperamide (IMODIUM A-D) 2 mg tablet Take 1 tablet (2 mg total) by mouth daily. 30 tablet 3   ??? losartan (COZAAR) 25 MG tablet TAKE 1 TABLET BY MOUTH ONCE DAILY 90 tablet 3   ??? omeprazole (PRILOSEC) 20 MG capsule Take 1 capsule (20 mg total) by mouth daily. 90 capsule 0   ??? ondansetron (ZOFRAN-ODT) 8 MG disintegrating tablet Take 1 tablet (8 mg total) by mouth every twelve (12) hours. (Patient taking differently: Take 8 mg by mouth every eight (8) hours as needed. ) 90 tablet 1   ??? urea (CARMOL) 40 % cream Apply twice daily to sore areas on hands and feet 28 g 1     No current facility-administered medications for this visit.         Changes to medications: Corderius reports starting the following medications: diphenoxylate-atropine (Starting today for diarrhea) No Known Allergies    Changes to allergies: No    SPECIALTY MEDICATION ADHERENCE     Afinitor 5 mg: 6 days of medicine on hand   Lenvima 18 mg/day: 6 days of medicine on hand       Medication Adherence    Patient reported X missed doses in the last month:  0  Specialty Medication:  Lenvima  Patient is on additional specialty medications:  Yes  Additional Specialty Medications:  Afinitor  Patient Reported Additional Medication X Missed Doses in the Last Month:  0  Adherence tools used:  medication list, directed education   Other adherence tool:  morning routine   Support network for adherence:  family member          Specialty medication(s) dose(s) confirmed: Regimen is correct and unchanged.     Are there any concerns with adherence? No    Adherence counseling provided? Not needed    CLINICAL MANAGEMENT AND INTERVENTION      Clinical Benefit Assessment:    Do you feel the medicine is effective or helping your condition? Yes    Clinical Benefit counseling provided? Not needed    Adverse Effects Assessment:    Are you experiencing any side effects? Yes, patient reports experiencing diarrhea, redness on hands and feet. Side effect  counseling provided: patient has urea cream which has helped with hands and feet. Patient has reported both side effects to provider.    Are you experiencing difficulty administering your medicine? No    Quality of Life Assessment:    How many days over the past month did your cancer  keep you from your normal activities? For example, brushing your teeth or getting up in the morning. 0    Have you discussed this with your provider? Not needed    Therapy Appropriateness:    Is therapy appropriate? Yes, therapy is appropriate and should be continued    DISEASE/MEDICATION-SPECIFIC INFORMATION      N/A    PATIENT SPECIFIC NEEDS     ? Does the patient have any physical, cognitive, or cultural barriers? No    ? Is the patient high risk? No     ? Does the patient require a Care Management Plan? No     ? Does the patient require physician intervention or other additional services (i.e. nutrition, smoking cessation, social work)? No      SHIPPING     Specialty Medication(s) to be Shipped:   Hematology/Oncology: Afinitor and Lenvima    Other medication(s) to be shipped: n/a     Changes to insurance: No    Delivery Scheduled: Yes, Expected medication delivery date: 4/28.     Medication will be delivered via UPS to the confirmed home address in [facility].    The patient will receive a drug information handout for each medication shipped and additional FDA Medication Guides as required.  Verified that patient has previously received a Conservation officer, historic buildings.    Clydell Hakim   [facility] Specialty Pharmacist

## 2018-11-14 NOTE — Unmapped (Signed)
Please refill if appropriate  everolimus, antineoplastic, (AFINITOR) 5 mg tablet           Sig: Take 1 tablet (5 mg total) by mouth daily.

## 2018-11-16 ENCOUNTER — Inpatient Hospital Stay (HOSPITAL_COMMUNITY)
Admission: EM | Admit: 2018-11-16 | Discharge: 2018-11-23 | DRG: 208 | Disposition: A | Discharge status: Home Health | Payer: Medicare Other | Attending: Family Medicine | Admitting: Family Medicine

## 2018-11-16 ENCOUNTER — Encounter (HOSPITAL_COMMUNITY): Payer: Self-pay | Admitting: Emergency Medicine

## 2018-11-16 ENCOUNTER — Other Ambulatory Visit: Payer: Self-pay

## 2018-11-16 ENCOUNTER — Emergency Department (HOSPITAL_COMMUNITY): Payer: Medicare Other

## 2018-11-16 ENCOUNTER — Encounter: Payer: Self-pay | Admitting: Nurse Practitioner

## 2018-11-16 DIAGNOSIS — G9340 Encephalopathy, unspecified: Secondary | ICD-10-CM | POA: Diagnosis present

## 2018-11-16 DIAGNOSIS — N184 Chronic kidney disease, stage 4 (severe): Secondary | ICD-10-CM | POA: Diagnosis present

## 2018-11-16 DIAGNOSIS — Z7189 Other specified counseling: Secondary | ICD-10-CM | POA: Diagnosis not present

## 2018-11-16 DIAGNOSIS — R54 Age-related physical debility: Secondary | ICD-10-CM | POA: Diagnosis present

## 2018-11-16 DIAGNOSIS — K7030 Alcoholic cirrhosis of liver without ascites: Secondary | ICD-10-CM | POA: Diagnosis present

## 2018-11-16 DIAGNOSIS — C649 Malignant neoplasm of unspecified kidney, except renal pelvis: Secondary | ICD-10-CM | POA: Diagnosis not present

## 2018-11-16 DIAGNOSIS — I129 Hypertensive chronic kidney disease with stage 1 through stage 4 chronic kidney disease, or unspecified chronic kidney disease: Secondary | ICD-10-CM | POA: Diagnosis present

## 2018-11-16 DIAGNOSIS — R402433 Glasgow coma scale score 3-8, at hospital admission: Secondary | ICD-10-CM

## 2018-11-16 DIAGNOSIS — E039 Hypothyroidism, unspecified: Secondary | ICD-10-CM | POA: Diagnosis present

## 2018-11-16 DIAGNOSIS — I213 ST elevation (STEMI) myocardial infarction of unspecified site: Secondary | ICD-10-CM | POA: Diagnosis not present

## 2018-11-16 DIAGNOSIS — E162 Hypoglycemia, unspecified: Secondary | ICD-10-CM | POA: Diagnosis not present

## 2018-11-16 DIAGNOSIS — E870 Hyperosmolality and hypernatremia: Secondary | ICD-10-CM | POA: Diagnosis not present

## 2018-11-16 DIAGNOSIS — I2109 ST elevation (STEMI) myocardial infarction involving other coronary artery of anterior wall: Secondary | ICD-10-CM | POA: Diagnosis not present

## 2018-11-16 DIAGNOSIS — K7291 Hepatic failure, unspecified with coma: Secondary | ICD-10-CM | POA: Diagnosis not present

## 2018-11-16 DIAGNOSIS — Z66 Do not resuscitate: Secondary | ICD-10-CM | POA: Diagnosis not present

## 2018-11-16 DIAGNOSIS — K7200 Acute and subacute hepatic failure without coma: Secondary | ICD-10-CM | POA: Diagnosis not present

## 2018-11-16 DIAGNOSIS — Z681 Body mass index (BMI) 19 or less, adult: Secondary | ICD-10-CM | POA: Diagnosis not present

## 2018-11-16 DIAGNOSIS — I493 Ventricular premature depolarization: Secondary | ICD-10-CM

## 2018-11-16 DIAGNOSIS — Z9119 Patient's noncompliance with other medical treatment and regimen: Secondary | ICD-10-CM | POA: Diagnosis not present

## 2018-11-16 DIAGNOSIS — K7290 Hepatic failure, unspecified without coma: Secondary | ICD-10-CM | POA: Diagnosis present

## 2018-11-16 DIAGNOSIS — Z515 Encounter for palliative care: Secondary | ICD-10-CM

## 2018-11-16 DIAGNOSIS — I5181 Takotsubo syndrome: Secondary | ICD-10-CM | POA: Diagnosis present

## 2018-11-16 DIAGNOSIS — N179 Acute kidney failure, unspecified: Secondary | ICD-10-CM | POA: Diagnosis present

## 2018-11-16 DIAGNOSIS — Z79899 Other long term (current) drug therapy: Secondary | ICD-10-CM

## 2018-11-16 DIAGNOSIS — E7220 Disorder of urea cycle metabolism, unspecified: Secondary | ICD-10-CM | POA: Diagnosis present

## 2018-11-16 DIAGNOSIS — E872 Acidosis: Secondary | ICD-10-CM | POA: Diagnosis present

## 2018-11-16 DIAGNOSIS — I361 Nonrheumatic tricuspid (valve) insufficiency: Secondary | ICD-10-CM | POA: Diagnosis not present

## 2018-11-16 DIAGNOSIS — I161 Hypertensive emergency: Secondary | ICD-10-CM | POA: Diagnosis present

## 2018-11-16 DIAGNOSIS — E876 Hypokalemia: Secondary | ICD-10-CM | POA: Diagnosis present

## 2018-11-16 DIAGNOSIS — D638 Anemia in other chronic diseases classified elsewhere: Secondary | ICD-10-CM | POA: Diagnosis present

## 2018-11-16 DIAGNOSIS — G92 Toxic encephalopathy: Secondary | ICD-10-CM | POA: Diagnosis present

## 2018-11-16 DIAGNOSIS — F1010 Alcohol abuse, uncomplicated: Secondary | ICD-10-CM | POA: Diagnosis present

## 2018-11-16 DIAGNOSIS — C771 Secondary and unspecified malignant neoplasm of intrathoracic lymph nodes: Secondary | ICD-10-CM | POA: Diagnosis present

## 2018-11-16 DIAGNOSIS — Z79890 Hormone replacement therapy (postmenopausal): Secondary | ICD-10-CM

## 2018-11-16 DIAGNOSIS — I2510 Atherosclerotic heart disease of native coronary artery without angina pectoris: Secondary | ICD-10-CM | POA: Diagnosis present

## 2018-11-16 DIAGNOSIS — J9601 Acute respiratory failure with hypoxia: Principal | ICD-10-CM | POA: Diagnosis present

## 2018-11-16 DIAGNOSIS — K7211 Chronic hepatic failure with coma: Secondary | ICD-10-CM | POA: Diagnosis present

## 2018-11-16 DIAGNOSIS — K7682 Hepatic encephalopathy: Secondary | ICD-10-CM | POA: Diagnosis present

## 2018-11-16 DIAGNOSIS — C642 Malignant neoplasm of left kidney, except renal pelvis: Secondary | ICD-10-CM | POA: Diagnosis not present

## 2018-11-16 DIAGNOSIS — R1310 Dysphagia, unspecified: Secondary | ICD-10-CM | POA: Diagnosis not present

## 2018-11-16 DIAGNOSIS — J9600 Acute respiratory failure, unspecified whether with hypoxia or hypercapnia: Secondary | ICD-10-CM | POA: Diagnosis not present

## 2018-11-16 DIAGNOSIS — I340 Nonrheumatic mitral (valve) insufficiency: Secondary | ICD-10-CM | POA: Diagnosis not present

## 2018-11-16 DIAGNOSIS — R64 Cachexia: Secondary | ICD-10-CM | POA: Diagnosis present

## 2018-11-16 DIAGNOSIS — M6259 Muscle wasting and atrophy, not elsewhere classified, multiple sites: Secondary | ICD-10-CM | POA: Diagnosis present

## 2018-11-16 DIAGNOSIS — J9690 Respiratory failure, unspecified, unspecified whether with hypoxia or hypercapnia: Secondary | ICD-10-CM

## 2018-11-16 DIAGNOSIS — I509 Heart failure, unspecified: Secondary | ICD-10-CM

## 2018-11-16 DIAGNOSIS — Z8701 Personal history of pneumonia (recurrent): Secondary | ICD-10-CM

## 2018-11-16 DIAGNOSIS — I5021 Acute systolic (congestive) heart failure: Secondary | ICD-10-CM | POA: Diagnosis not present

## 2018-11-16 DIAGNOSIS — R197 Diarrhea, unspecified: Secondary | ICD-10-CM | POA: Diagnosis present

## 2018-11-16 DIAGNOSIS — Z20828 Contact with and (suspected) exposure to other viral communicable diseases: Secondary | ICD-10-CM | POA: Diagnosis present

## 2018-11-16 DIAGNOSIS — J449 Chronic obstructive pulmonary disease, unspecified: Secondary | ICD-10-CM | POA: Diagnosis present

## 2018-11-16 DIAGNOSIS — G4733 Obstructive sleep apnea (adult) (pediatric): Secondary | ICD-10-CM | POA: Diagnosis present

## 2018-11-16 HISTORY — DX: Unspecified cirrhosis of liver: K74.60

## 2018-11-16 HISTORY — DX: Alcoholic cirrhosis of liver without ascites: K70.30

## 2018-11-16 HISTORY — DX: Disorder of kidney and ureter, unspecified: N28.9

## 2018-11-16 HISTORY — DX: Essential (primary) hypertension: I10

## 2018-11-16 HISTORY — DX: Disorder of thyroid, unspecified: E07.9

## 2018-11-16 HISTORY — DX: Malignant neoplasm of unspecified kidney, except renal pelvis: C64.9

## 2018-11-16 LAB — BASIC METABOLIC PANEL
Anion gap: 13 (ref 5–15)
BUN: 61 mg/dL — ABNORMAL HIGH (ref 8–23)
CO2: 11 mmol/L — ABNORMAL LOW (ref 22–32)
Calcium: 8.4 mg/dL — ABNORMAL LOW (ref 8.9–10.3)
Chloride: 117 mmol/L — ABNORMAL HIGH (ref 98–111)
Creatinine, Ser: 2.98 mg/dL — ABNORMAL HIGH (ref 0.61–1.24)
GFR calc Af Amer: 24 mL/min — ABNORMAL LOW (ref >60)
GFR calc non Af Amer: 20 mL/min — ABNORMAL LOW (ref >60)
Glucose, Bld: 150 mg/dL — ABNORMAL HIGH (ref 70–99)
Potassium: 4.4 mmol/L (ref 3.5–5.1)
Sodium: 141 mmol/L (ref 135–145)

## 2018-11-16 LAB — COMPREHENSIVE METABOLIC PANEL
ALT: 33 U/L (ref 0–44)
AST: 51 U/L — ABNORMAL HIGH (ref 15–41)
Albumin: 4.3 g/dL (ref 3.5–5.0)
Alkaline Phosphatase: 155 U/L — ABNORMAL HIGH (ref 38–126)
Anion gap: 15 (ref 5–15)
BUN: 72 mg/dL — ABNORMAL HIGH (ref 8–23)
CO2: 9 mmol/L — ABNORMAL LOW (ref 22–32)
Calcium: 9.9 mg/dL (ref 8.9–10.3)
Chloride: 112 mmol/L — ABNORMAL HIGH (ref 98–111)
Creatinine, Ser: 3.46 mg/dL — ABNORMAL HIGH (ref 0.61–1.24)
GFR calc Af Amer: 20 mL/min — ABNORMAL LOW (ref >60)
GFR calc non Af Amer: 17 mL/min — ABNORMAL LOW (ref >60)
Glucose, Bld: 96 mg/dL (ref 70–99)
Potassium: 3.4 mmol/L — ABNORMAL LOW (ref 3.5–5.1)
Sodium: 136 mmol/L (ref 135–145)
Total Bilirubin: 1.1 mg/dL (ref 0.3–1.2)
Total Protein: 8.7 g/dL — ABNORMAL HIGH (ref 6.5–8.1)

## 2018-11-16 LAB — POCT I-STAT 7, (LYTES, BLD GAS, ICA,H+H)
Acid-base deficit: 14 mmol/L — ABNORMAL HIGH (ref 0.0–2.0)
Acid-base deficit: 17 mmol/L — ABNORMAL HIGH (ref 0.0–2.0)
Bicarbonate: 10.8 mmol/L — ABNORMAL LOW (ref 20.0–28.0)
Bicarbonate: 8.1 mmol/L — ABNORMAL LOW (ref 20.0–28.0)
Calcium, Ion: 1.23 mmol/L (ref 1.15–1.40)
Calcium, Ion: 1.23 mmol/L (ref 1.15–1.40)
HCT: 31 % — ABNORMAL LOW (ref 39.0–52.0)
HCT: 38 % — ABNORMAL LOW (ref 39.0–52.0)
Hemoglobin: 10.5 g/dL — ABNORMAL LOW (ref 13.0–17.0)
Hemoglobin: 12.9 g/dL — ABNORMAL LOW (ref 13.0–17.0)
O2 Saturation: 100 %
O2 Saturation: 99 %
Patient temperature: 86.8
Patient temperature: 93
Potassium: 2.6 mmol/L — CL (ref 3.5–5.1)
Potassium: 3.8 mmol/L (ref 3.5–5.1)
Sodium: 142 mmol/L (ref 135–145)
Sodium: 142 mmol/L (ref 135–145)
TCO2: 12 mmol/L — ABNORMAL LOW (ref 22–32)
TCO2: 9 mmol/L — ABNORMAL LOW (ref 22–32)
pCO2 arterial: 17.1 mmHg — CL (ref 32.0–48.0)
pCO2 arterial: 17 mmHg — CL (ref 32.0–48.0)
pH, Arterial: 7.376 (ref 7.350–7.450)
pH, Arterial: 7.268 — ABNORMAL LOW (ref 7.350–7.450)
pO2, Arterial: 463 mmHg — ABNORMAL HIGH (ref 83.0–108.0)
pO2, Arterial: 141 mmHg — ABNORMAL HIGH (ref 83.0–108.0)

## 2018-11-16 LAB — CBC
HCT: 39.6 % (ref 39.0–52.0)
Hemoglobin: 13 g/dL (ref 13.0–17.0)
MCH: 29.1 pg (ref 26.0–34.0)
MCHC: 32.8 g/dL (ref 30.0–36.0)
MCV: 88.6 fL (ref 80.0–100.0)
Platelets: 181 10*3/uL (ref 150–400)
RBC: 4.47 MIL/uL (ref 4.22–5.81)
RDW: 21.8 % — ABNORMAL HIGH (ref 11.5–15.5)
WBC: 5.7 10*3/uL (ref 4.0–10.5)
nRBC: 0 % (ref 0.0–0.2)

## 2018-11-16 LAB — DIFFERENTIAL
Abs Immature Granulocytes: 0.02 10*3/uL (ref 0.00–0.07)
Basophils Absolute: 0 10*3/uL (ref 0.0–0.1)
Basophils Relative: 0 %
Eosinophils Absolute: 0 10*3/uL (ref 0.0–0.5)
Eosinophils Relative: 1 %
Immature Granulocytes: 0 %
Lymphocytes Relative: 25 %
Lymphs Abs: 1.4 10*3/uL (ref 0.7–4.0)
Monocytes Absolute: 0.7 10*3/uL (ref 0.1–1.0)
Monocytes Relative: 12 %
Neutro Abs: 3.5 10*3/uL (ref 1.7–7.7)
Neutrophils Relative %: 62 %

## 2018-11-16 LAB — PROTIME-INR
INR: 1.2 (ref 0.8–1.2)
Prothrombin Time: 14.9 seconds (ref 11.4–15.2)

## 2018-11-16 LAB — URINALYSIS, ROUTINE W REFLEX MICROSCOPIC
Bilirubin Urine: NEGATIVE
Glucose, UA: NEGATIVE mg/dL
Ketones, ur: NEGATIVE mg/dL
Leukocytes,Ua: NEGATIVE
Nitrite: NEGATIVE
Protein, ur: 30 mg/dL — AB
Specific Gravity, Urine: 1.006 (ref 1.005–1.030)
pH: 5 (ref 5.0–8.0)

## 2018-11-16 LAB — TROPONIN I
Troponin I: 8.55 ng/mL (ref <0.03)
Troponin I: 13.43 ng/mL (ref <0.03)

## 2018-11-16 LAB — GLUCOSE, CAPILLARY
Glucose-Capillary: 146 mg/dL — ABNORMAL HIGH (ref 70–99)
Glucose-Capillary: 117 mg/dL — ABNORMAL HIGH (ref 70–99)

## 2018-11-16 LAB — POCT I-STAT EG7
Acid-base deficit: 15 mmol/L — ABNORMAL HIGH (ref 0.0–2.0)
Bicarbonate: 9.6 mmol/L — ABNORMAL LOW (ref 20.0–28.0)
Calcium, Ion: 1.2 mmol/L (ref 1.15–1.40)
HCT: 41 % (ref 39.0–52.0)
Hemoglobin: 13.9 g/dL (ref 13.0–17.0)
O2 Saturation: 95 %
Potassium: 3.5 mmol/L (ref 3.5–5.1)
Sodium: 139 mmol/L (ref 135–145)
TCO2: 10 mmol/L — ABNORMAL LOW (ref 22–32)
pCO2, Ven: 20.9 mmHg — ABNORMAL LOW (ref 44.0–60.0)
pH, Ven: 7.267 (ref 7.250–7.430)
pO2, Ven: 83 mmHg — ABNORMAL HIGH (ref 32.0–45.0)

## 2018-11-16 LAB — CBG MONITORING, ED: Glucose-Capillary: 81 mg/dL (ref 70–99)

## 2018-11-16 LAB — TSH: TSH: 128.717 u[IU]/mL — ABNORMAL HIGH (ref 0.350–4.500)

## 2018-11-16 LAB — SARS CORONAVIRUS 2 BY RT PCR (HOSPITAL ORDER, PERFORMED IN ~~LOC~~ HOSPITAL LAB): SARS Coronavirus 2: NEGATIVE

## 2018-11-16 LAB — PROCALCITONIN: Procalcitonin: <0.1 ng/mL

## 2018-11-16 LAB — OSMOLALITY: Osmolality: 321 mOsm/kg (ref 275–295)

## 2018-11-16 LAB — I-STAT CREATININE, ED: Creatinine, Ser: 3.8 mg/dL — ABNORMAL HIGH (ref 0.61–1.24)

## 2018-11-16 LAB — SALICYLATE LEVEL: Salicylate Lvl: <7 mg/dL (ref 2.8–30.0)

## 2018-11-16 LAB — APTT: aPTT: 30 seconds (ref 24–36)

## 2018-11-16 LAB — AMMONIA: Ammonia: 133 umol/L — ABNORMAL HIGH (ref 9–35)

## 2018-11-16 LAB — LACTIC ACID, PLASMA: Lactic Acid, Venous: 3.7 mmol/L (ref 0.5–1.9)

## 2018-11-16 LAB — CORTISOL: Cortisol, Plasma: 23.5 ug/dL

## 2018-11-16 LAB — ACETAMINOPHEN LEVEL: Acetaminophen (Tylenol), Serum: <10 ug/mL — ABNORMAL LOW (ref 10–30)

## 2018-11-16 MED ORDER — LABETALOL HCL 5 MG/ML IV SOLN
INTRAVENOUS | Status: AC
  Administered 2018-11-16: 20 mg
  Filled 2018-11-16: qty 4

## 2018-11-16 MED ORDER — ONDANSETRON HCL 4 MG/2ML IJ SOLN: 4.0000 mg | Freq: Four times a day (QID) | INTRAMUSCULAR | Status: DC | PRN

## 2018-11-16 MED ORDER — PROPOFOL 1000 MG/100ML IV EMUL
INTRAVENOUS | Status: AC
  Filled 2018-11-16: qty 100

## 2018-11-16 MED ORDER — MIDAZOLAM HCL 2 MG/2ML IJ SOLN
1.0000 mg | INTRAMUSCULAR | Status: DC | PRN
  Administered 2018-11-16: 1 mg via INTRAVENOUS
  Filled 2018-11-16: qty 2

## 2018-11-16 MED ORDER — SODIUM CHLORIDE 0.9 % IV BOLUS
30.0000 mL/kg | Freq: Once | INTRAVENOUS | Status: AC
  Administered 2018-11-16: 1686 mL via INTRAVENOUS

## 2018-11-16 MED ORDER — LACTATED RINGERS IV SOLN
INTRAVENOUS | Status: DC
  Administered 2018-11-16 – 2018-11-17 (×3): via INTRAVENOUS

## 2018-11-16 MED ORDER — EPINEPHRINE 1 MG/10ML IJ SOSY
PREFILLED_SYRINGE | INTRAMUSCULAR | Status: AC
  Filled 2018-11-16: qty 10

## 2018-11-16 MED ORDER — SENNOSIDES 8.8 MG/5ML PO SYRP
5.0000 mL | ORAL_SOLUTION | Freq: Two times a day (BID) | ORAL | Status: DC | PRN
  Filled 2018-11-16: qty 5
  Filled 2018-11-16: qty 10

## 2018-11-16 MED ORDER — LABETALOL HCL 5 MG/ML IV SOLN
20.0000 mg | Freq: Once | INTRAVENOUS | Status: AC
  Administered 2018-11-16: 10 mg via INTRAVENOUS

## 2018-11-16 MED ORDER — ASPIRIN 81 MG PO CHEW
81.0000 mg | CHEWABLE_TABLET | Freq: Every day | ORAL | Status: DC
  Administered 2018-11-16 – 2018-11-21 (×6): 81 mg
  Filled 2018-11-16 (×6): qty 1

## 2018-11-16 MED ORDER — ALBUTEROL SULFATE (2.5 MG/3ML) 0.083% IN NEBU: 2.5000 mg | INHALATION_SOLUTION | RESPIRATORY_TRACT | Status: DC | PRN

## 2018-11-16 MED ORDER — ONDANSETRON HCL 4 MG/2ML IJ SOLN
INTRAMUSCULAR | Status: AC
  Administered 2018-11-16: 13:00:00
  Filled 2018-11-16: qty 2

## 2018-11-16 MED ORDER — ROCURONIUM BROMIDE 50 MG/5ML IV SOLN
INTRAVENOUS | Status: AC | PRN
  Administered 2018-11-16: 60 mg via INTRAVENOUS

## 2018-11-16 MED ORDER — SODIUM CHLORIDE 0.9% FLUSH
3.0000 mL | Freq: Two times a day (BID) | INTRAVENOUS | Status: DC
  Administered 2018-11-16 – 2018-11-17 (×3): 3 mL via INTRAVENOUS

## 2018-11-16 MED ORDER — HEPARIN BOLUS VIA INFUSION
3000.0000 [IU] | Freq: Once | INTRAVENOUS | Status: AC
  Administered 2018-11-16: 3000 [IU] via INTRAVENOUS
  Filled 2018-11-16: qty 3000

## 2018-11-16 MED ORDER — LABETALOL HCL 5 MG/ML IV SOLN
10.0000 mg | INTRAVENOUS | Status: DC | PRN
  Filled 2018-11-16: qty 4

## 2018-11-16 MED ORDER — PANTOPRAZOLE SODIUM 40 MG IV SOLR
40.0000 mg | Freq: Every day | INTRAVENOUS | Status: DC
  Administered 2018-11-16 – 2018-11-18 (×3): 40 mg via INTRAVENOUS
  Filled 2018-11-16 (×3): qty 40

## 2018-11-16 MED ORDER — LEVETIRACETAM IN NACL 1000 MG/100ML IV SOLN
1000.0000 mg | Freq: Once | INTRAVENOUS | Status: AC
  Administered 2018-11-16: 1000 mg via INTRAVENOUS
  Filled 2018-11-16: qty 100

## 2018-11-16 MED ORDER — METRONIDAZOLE IN NACL 5-0.79 MG/ML-% IV SOLN
500.0000 mg | Freq: Once | INTRAVENOUS | Status: DC
  Filled 2018-11-16: qty 100

## 2018-11-16 MED ORDER — SODIUM CHLORIDE 0.9% FLUSH: 3.0000 mL | INTRAVENOUS | Status: DC | PRN

## 2018-11-16 MED ORDER — FENTANYL CITRATE (PF) 100 MCG/2ML IJ SOLN
25.0000 ug | INTRAMUSCULAR | Status: DC | PRN
  Administered 2018-11-16: 50 ug via INTRAVENOUS
  Filled 2018-11-16: qty 2

## 2018-11-16 MED ORDER — LEVOTHYROXINE SODIUM 100 MCG PO TABS
100.0000 ug | ORAL_TABLET | Freq: Every day | ORAL | Status: DC
  Administered 2018-11-17: 100 ug via ORAL
  Filled 2018-11-16: qty 1

## 2018-11-16 MED ORDER — POTASSIUM CHLORIDE 20 MEQ/15ML (10%) PO SOLN
40.0000 meq | Freq: Once | ORAL | Status: AC
  Administered 2018-11-16: 15:00:00 40 meq
  Filled 2018-11-16: qty 30

## 2018-11-16 MED ORDER — POTASSIUM CHLORIDE 10 MEQ/100ML IV SOLN
10.0000 meq | INTRAVENOUS | Status: AC
  Administered 2018-11-16 (×2): 10 meq via INTRAVENOUS
  Filled 2018-11-16 (×2): qty 100

## 2018-11-16 MED ORDER — ETOMIDATE 2 MG/ML IV SOLN
INTRAVENOUS | Status: AC | PRN
  Administered 2018-11-16: 15 mg via INTRAVENOUS

## 2018-11-16 MED ORDER — PROPOFOL 1000 MG/100ML IV EMUL
5.0000 ug/kg/min | INTRAVENOUS | Status: DC
  Administered 2018-11-16: 10 ug/kg/min via INTRAVENOUS

## 2018-11-16 MED ORDER — HEPARIN SODIUM (PORCINE) 5000 UNIT/ML IJ SOLN: 5000.0000 [IU] | Freq: Three times a day (TID) | INTRAMUSCULAR | Status: DC

## 2018-11-16 MED ORDER — LACTULOSE 10 GM/15ML PO SOLN
30.0000 g | Freq: Two times a day (BID) | ORAL | Status: DC
  Administered 2018-11-16 – 2018-11-23 (×15): 30 g
  Filled 2018-11-16 (×16): qty 45

## 2018-11-16 MED ORDER — CHLORHEXIDINE GLUCONATE 0.12% ORAL RINSE (MEDLINE KIT)
15.0000 mL | Freq: Two times a day (BID) | OROMUCOSAL | Status: DC
  Administered 2018-11-16 – 2018-11-19 (×6): 15 mL via OROMUCOSAL

## 2018-11-16 MED ORDER — AMIODARONE IV BOLUS ONLY 150 MG/100ML: 150.0000 mg | Freq: Once | INTRAVENOUS | Status: DC

## 2018-11-16 MED ORDER — VANCOMYCIN HCL 10 G IV SOLR
1250.0000 mg | Freq: Once | INTRAVENOUS | Status: DC
  Filled 2018-11-16: qty 1250

## 2018-11-16 MED ORDER — SODIUM CHLORIDE 0.9% FLUSH
3.0000 mL | Freq: Once | INTRAVENOUS | Status: AC
  Administered 2018-11-16: 13:00:00 3 mL via INTRAVENOUS

## 2018-11-16 MED ORDER — ORAL CARE MOUTH RINSE
15.0000 mL | OROMUCOSAL | Status: DC
  Administered 2018-11-16 – 2018-11-19 (×25): 15 mL via OROMUCOSAL

## 2018-11-16 MED ORDER — RIFAXIMIN 550 MG PO TABS
550.0000 mg | ORAL_TABLET | Freq: Two times a day (BID) | ORAL | Status: DC
  Administered 2018-11-16 – 2018-11-21 (×10): 550 mg
  Filled 2018-11-16 (×10): qty 1

## 2018-11-16 MED ORDER — SODIUM CHLORIDE 0.9 % IV SOLN: 250.0000 mL | INTRAVENOUS | Status: DC | PRN

## 2018-11-16 MED ORDER — HEPARIN (PORCINE) 25000 UT/250ML-% IV SOLN
700.0000 [IU]/h | INTRAVENOUS | Status: DC
  Administered 2018-11-16: 700 [IU]/h via INTRAVENOUS
  Filled 2018-11-16: qty 250

## 2018-11-16 MED ORDER — ACETAMINOPHEN 325 MG PO TABS
650.0000 mg | ORAL_TABLET | ORAL | Status: DC | PRN
  Administered 2018-11-18: 650 mg via ORAL
  Filled 2018-11-16: qty 2

## 2018-11-16 MED ORDER — SODIUM CHLORIDE 0.9 % IV SOLN
1.0000 g | INTRAVENOUS | Status: DC
  Filled 2018-11-16: qty 1

## 2018-11-16 NOTE — H&P (Signed)
NAME:  Stephen Grimes, MRN:  [id number], DOB:  1950/09/25, LOS: 0 ADMISSION DATE:  11/16/2018, CONSULTATION DATE: 4/26  REFERRING MD:  EDP PA Joy , CHIEF COMPLAINT:  AMS/Resp Failure   Brief History   68 yo male with metastatic renal carcinoma , alcoholic cirrhosis reported altered at home, EMS transported to ER altered with vomiting and elevated b/p . Intubated in ER . CT Head neg for acute . COVID ruled out . Ammonia elevated. EKG with ST elevation   History of present illness   68 yo male with metastatic renal carcinoma , alcoholic cirrhosis altered at home . EMS transported to ER  Limited history concerning presentation. No family in ER .  Marland Kitchen Lives with wife at home that has dementia.   On arrival to ER with worsening mental function , vomiting and elevated b/p . Was intubated in ER for airway protection .  ER workup with CT head neg for acute process. COVID ruled out. Ammonia elevated. Chest xray clear . Afebrile and normal wbc. Bigemny and ectopy increased in ER . Repeat EKG with significant ST elevation . Cardiology consulted. Remained unresponsive off sedation . On exam no reflexes /gag . Neuro consulted. Scr high on arrival at 3.8.  Patient did require Labetolol for frequent ectopy . He was started on Heparin for presumed acute MI . K+ low in ER . K+ replaced.     Past Medical History  Metastatic renal CA  HTN  Hypothyroid   Significant Hospital Events     Consults:  4/26 Neuo  4.26 Cards   Procedures:  4/26 ETT >>   Significant Diagnostic Tests:  4/26 CT Head >no acute  4/26 MRI >>  Micro Data:    Antimicrobials:     Interim history/subjective:  PCCM consult for admit -resp failure on vent   Objective   Blood pressure (!) 179/78, pulse (!) 34, temperature (!) 96.4 F (35.8 C), temperature source Rectal, resp. rate (!) 21, height 5\' 7"  (1.702 m), weight 56.2 kg, SpO2 100 %.    Vent Mode: PRVC FiO2 (%):  [45 %-100 %] 45 % Set Rate:  [16 bmp] 16 bmp Vt Set:   [520 mL] 520 mL PEEP:  [5 cmH20] 5 cmH20 Plateau Pressure:  [12 cmH20] 12 cmH20  No intake or output data in the 24 hours ending 11/16/18 1419 Filed Weights   11/16/18 1250  Weight: 56.2 kg    Examination: General: elderly male on vent sedated  HENT: NCAT , ETT in place  Lungs: CTA  Cardiovascular: ST , no m/r/g  Abdomen: Soft , flat Hypoactive BS  Extremities: intact w. No edema , absent DTR  Neuro: sedated on propofol , no response, no gag , pinpoint pupils , non reactive,  GU: foley   Resolved Hospital Problem list     Assessment & Plan:  Acute Respiratory Failure requiring vent support , etiology unclear with altered mental status- unable to protect airway  CXR clear  Covid rule out (PUI )   Plan  Full Vent support  VAP Eval for daily SBT  Check CXR in am  ABG in 1 hr   Acute Kidney Injury ? Etiology .Unclear baseline   Plan  Tr bmet  Avoid nephrotoxin  IV hydration -LR at 100/hr   Vomiting ? Etiology  Mildly increased LFTs  KUB neg for acute 4/26   Plan  Tr LFT s  Hypokalemia  Plan  Replace K + , repeat bmet in 4 hr  Acute encephalopathy -questionable etiology ? Possible Hepatic  -vomiting and right sided gaze on admit ? Seizure  Hx of ETOH Cirrhosis  CT head 4/26 neg for acute , ammonia elevated    Plan Sedation protocol  Ammonia pending  Check tox screen  Consult neuro  keppra 1gram x 1  Check MRI  EEG in am  D/c propofol .  Lactulose 30 Twice daily   Fent/versed  As needed   Tr ammonia   HTN , -on norvasc/losartan at home  Intermittent runs of V Tach  EKG with ST elevation c/w STEMI   Plan  Monitor electrolytes closely and replace K +  Check Mg  Trend troponin  EKG as indicated  Consult Cardiology  Begin IV Hep   Anemia  Plan  Tr cbc   Hypothyroidism  On levothyroxine at home   Plan  Check tsh  Restart home dose in am   Metastatic Renal Cell Carcinoma -   Plan  Hold home chemo for now    Best practice:   Diet: NPO  Pain/Anxiety/Delirium protocol (if indicated): in place fent/versed as needed  VAP protocol (if indicated): in place  DVT prophylaxis: Hep  GI prophylaxis: Protonix  Glucose control: add SSI if BS tr up  Mobility: BR  Code Status: Full  Family Communication: non available  Disposition: ICU   Labs   CBC: Recent Labs  Lab 11/16/18 1241 11/16/18 1301 11/16/18 1402  WBC 5.7  --   --   NEUTROABS 3.5  --   --   HGB 13.0 13.9 10.5*  HCT 39.6 41.0 31.0*  MCV 88.6  --   --   PLT 181  --   --     Basic Metabolic Panel: Recent Labs  Lab 11/16/18 1241 11/16/18 1246 11/16/18 1301 11/16/18 1402  NA 136  --  139 142  K 3.4*  --  3.5 2.6*  CL 112*  --   --   --   CO2 9*  --   --   --   GLUCOSE 96  --   --   --   BUN 72*  --   --   --   CREATININE 3.46* 3.80*  --   --   CALCIUM 9.9  --   --   --    GFR: Estimated Creatinine Clearance: 14.6 mL/min (A) (by C-G formula based on SCr of 3.8 mg/dL (H)). Recent Labs  Lab 11/16/18 1241  WBC 5.7    Liver Function Tests: Recent Labs  Lab 11/16/18 1241  AST 51*  ALT 33  ALKPHOS 155*  BILITOT 1.1  PROT 8.7*  ALBUMIN 4.3   No results for input(s): LIPASE, AMYLASE in the last 168 hours. No results for input(s): AMMONIA in the last 168 hours.  ABG    Component Value Date/Time   PHART 7.376 11/16/2018 1402   PCO2ART 17.1 (LL) 11/16/2018 1402   PO2ART 463.0 (H) 11/16/2018 1402   HCO3 10.8 (L) 11/16/2018 1402   TCO2 12 (L) 11/16/2018 1402   ACIDBASEDEF 14.0 (H) 11/16/2018 1402   O2SAT 100.0 11/16/2018 1402     Coagulation Profile: Recent Labs  Lab 11/16/18 1241  INR 1.2    Cardiac Enzymes: No results for input(s): CKTOTAL, CKMB, CKMBINDEX, TROPONINI in the last 168 hours.  HbA1C: No results found for: HGBA1C  CBG: Recent Labs  Lab 11/16/18 1227  GLUCAP 81    Review of Systems:   Unable to obtain , unresponsive on vent  Past Medical History  He,  has no past medical history on file.    Surgical History   History reviewed. No pertinent surgical history.   Social History      Family History   His family history is not on file.   Allergies Not on File   Home Medications  Prior to Admission medications   Not on File     Critical care time: 40 min      Dimitri Dsouza NP-C  [facility]  [phone]  11/16/2018

## 2018-11-16 NOTE — ED Provider Notes (Signed)
[facility] EMERG[facility] DEPARTMENT Provider Note   CSN: [id number] Arrival date & time: 11/16/18  1208    History   Chief Complaint Chief Complaint  Patient presents with  . Altered Mental Status    HPI Stephen Grimes is a 68 y.o. male.     HPI   Level 5 caveat due to altered mental status.  Stephen Grimes is a 68 y.o. male, with a history of metastatic renal carcinoma, alcoholic cirrhosis, presenting to the ED unresponsive.  EMS called to the patient's house and found patient to be unresponsive.  He was at home with his wife, who has dementia.  All we knew about the patient at his time of arrival was that he has "some kind of cancer," per EMS. Vitals were stable with EMS.  Normal CBG.    History reviewed. No pertinent past medical history.  Patient Active Problem List   Diagnosis Date Noted  . Acute respiratory failure ([place]) 11/16/2018    History reviewed. No pertinent surgical history.      Home Medications    Prior to Admission medications   Not on File    Family History History reviewed. No pertinent family history.  Social History Social History   Tobacco Use  . Smoking status: Not on file  Substance Use Topics  . Alcohol use: Not on file  . Drug use: Not on file     Allergies   Patient has no allergy information on record.   Review of Systems Review of Systems  Unable to perform ROS: Patient unresponsive     Physical Exam Updated Vital Signs BP (!) 137/99   Pulse (!) 63   Temp (!) 96.4 F (35.8 C) (Rectal)   Resp (!) 21   Ht 5\' 7"  (1.702 m)   Wt 56.2 kg   SpO2 100%   BMI 19.42 kg/m   Physical Exam Vitals signs and nursing note reviewed.  Constitutional:      General: He is in acute distress.     Appearance: He is cachectic. He is ill-appearing. He is not diaphoretic.  HENT:     Head: Normocephalic and atraumatic.     Mouth/Throat:     Mouth: Mucous membranes are moist.     Pharynx: Oropharynx  is clear.  Eyes:     Conjunctiva/sclera: Conjunctivae normal.  Neck:     Musculoskeletal: Neck supple.  Cardiovascular:     Rate and Rhythm: Normal rate and regular rhythm.     Pulses: Normal pulses.          Radial pulses are 2+ on the right side and 2+ on the left side.       Posterior tibial pulses are 2+ on the right side and 2+ on the left side.     Heart sounds: Normal heart sounds.     Comments: Tactile temperature in the extremities appropriate and equal bilaterally. Pulmonary:     Effort: Pulmonary effort is normal. No respiratory distress.     Breath sounds: Normal breath sounds.  Abdominal:     Palpations: Abdomen is soft.     Tenderness: There is no abdominal tenderness. There is no guarding.     Hernia: A hernia is present.     Comments: Large ventral hernia, easily reducible.  Musculoskeletal:     Right lower leg: No edema.     Left lower leg: No edema.  Lymphadenopathy:     Cervical: No cervical adenopathy.  Skin:  General: Skin is warm and dry.  Neurological:     GCS: GCS eye subscore is 4. GCS verbal subscore is 2. GCS motor subscore is 4.     Comments: 1/5 strength in extremities, equal bilaterally.  Grimace to pain. Eyes are open, but will not react to stimuli.  Psychiatric:        Mood and Affect: Mood and affect normal.        Speech: Speech normal.        Behavior: Behavior normal.      ED Treatments / Results  Labs (all labs ordered are listed, but only abnormal results are displayed) Labs Reviewed  CBC - Abnormal; Notable for the following components:      Result Value   RDW 21.8 (*)    All other components within normal limits  COMPREHENSIVE METABOLIC PANEL - Abnormal; Notable for the following components:   Potassium 3.4 (*)    Chloride 112 (*)    CO2 9 (*)    BUN 72 (*)    Creatinine, Ser 3.46 (*)    Total Protein 8.7 (*)    AST 51 (*)    Alkaline Phosphatase 155 (*)    GFR calc non Af Amer 17 (*)    GFR calc Af Amer 20 (*)     All other components within normal limits  AMMONIA - Abnormal; Notable for the following components:   Ammonia 133 (*)    All other components within normal limits  I-STAT CREATININE, ED - Abnormal; Notable for the following components:   Creatinine, Ser 3.80 (*)    All other components within normal limits  POCT I-STAT EG7 - Abnormal; Notable for the following components:   pCO2, Ven 20.9 (*)    pO2, Ven 83.0 (*)    Bicarbonate 9.6 (*)    TCO2 10 (*)    Acid-base deficit 15.0 (*)    All other components within normal limits  POCT I-STAT 7, (LYTES, BLD GAS, ICA,H+H) - Abnormal; Notable for the following components:   pCO2 arterial 17.1 (*)    pO2, Arterial 463.0 (*)    Bicarbonate 10.8 (*)    TCO2 12 (*)    Acid-base deficit 14.0 (*)    Potassium 2.6 (*)    HCT 31.0 (*)    Hemoglobin 10.5 (*)    All other components within normal limits  SARS CORONAVIRUS 2 (HOSPITAL ORDER, [place] [facility])  CULTURE, BLOOD (ROUTINE X 2)  CULTURE, BLOOD (ROUTINE X 2)  URINE CULTURE  PROTIME-INR  APTT  DIFFERENTIAL  LACTIC ACID, PLASMA  HIV ANTIBODY (ROUTINE TESTING W REFLEX)  PROCALCITONIN  CORTISOL  URINALYSIS, ROUTINE W REFLEX MICROSCOPIC  BLOOD GAS, ARTERIAL  TROPONIN I  TSH  URINE DRUGS OF ABUSE SCREEN W ALC, ROUTINE (REF LAB)  VOLATILES,BLD-ACETONE,ETHANOL,ISOPROP,METHANOL  BASIC METABOLIC PANEL  OSMOLALITY  ACETAMINOPHEN LEVEL  SALICYLATE LEVEL  TROPONIN I  TROPONIN I  TROPONIN I  HEPARIN LEVEL (UNFRACTIONATED)  CBG MONITORING, ED  CBG MONITORING, ED    EKG EKG Interpretation  Date/Time:  Sunday November 16 2018 12:38:16 EDT Ventricular Rate:  85 PR Interval:    QRS Duration: 103 QT Interval:  350 QTC Calculation: 417 R Axis:   87 Text Interpretation:  Ectopic atrial rhythm Borderline right axis deviation Repolarization abnormality, prob rate related Confirmed by Gerlene Fee [phone]) on 11/16/2018 1:10:54 PM   EKG Interpretation  Date/Time:   Sunday November 16 2018 12:44:56 EDT Ventricular Rate:  100 PR Interval:  QRS Duration: 97 QT Interval:  429 QTC Calculation: 460 R Axis:   88 Text Interpretation:  Sinus tachycardia Ventricular bigeminy Probable left atrial enlargement Borderline right axis deviation Nonspecific T abnormalities, lateral leads Baseline wander in lead(s) V1 Confirmed by Gerlene Fee [phone]) on 11/16/2018 3:42:16 PM        Radiology Dg Abdomen 1 View  Result Date: 11/16/2018 CLINICAL DATA:  Repositioning of orogastric tube. EXAM: ABDOMEN - 1 VIEW COMPARISON:  Earlier the same day. FINDINGS: 1340 hours. The orogastric tube has been partially retracted, tip in the gastric fundus. No other significant changes are identified. There is gas throughout the small and large bowel, but no significant distension. Surgical clips are present in the right upper quadrant. IMPRESSION: Orogastric tube tip in the gastric fundus, less looped in the proximal stomach. Electronically Signed   By: Richardean Sale M.D.   On: 11/16/2018 14:07   Ct Head Wo Contrast  Result Date: 11/16/2018 CLINICAL DATA:  Found unresponsive. EXAM: CT HEAD WITHOUT CONTRAST TECHNIQUE: Contiguous axial images were obtained from the base of the skull through the vertex without intravenous contrast. COMPARISON:  None. FINDINGS: Brain: No evidence of acute infarction, hemorrhage, hydrocephalus, extra-axial collection or mass lesion/mass effect. Vascular: Calcification of the cavernous internal carotid arteries consistent with cerebrovascular atherosclerotic disease. No signs of intracranial large vessel occlusion. Skull: Normal. Negative for fracture or focal lesion. Sinuses/Orbits: No acute finding. Other: None. Electronically Signed   By: Staci Righter M.D.   On: 11/16/2018 13:29   Dg Chest Portable 1 View  Result Date: 11/16/2018 CLINICAL DATA:  Intubated patient. EXAM: PORTABLE CHEST 1 VIEW COMPARISON:  None. FINDINGS: 1314 hours. Tip of the endotracheal  tube is in the mid trachea. An orogastric tube is looped in the proximal stomach. The heart size and mediastinal contours are normal. There is mild aortic atherosclerosis. The lungs are clear. There is no pleural effusion or pneumothorax. No acute osseous findings are evident. IMPRESSION: Satisfactory position of the endotracheal and orogastric tubes. No acute cardiopulmonary process identified. Electronically Signed   By: Richardean Sale M.D.   On: 11/16/2018 14:06   Dg Abd Portable 1 View  Result Date: 11/16/2018 CLINICAL DATA:  Orogastric tube placement. EXAM: PORTABLE ABDOMEN - 1 VIEW COMPARISON:  None. FINDINGS: 1317 hours. Orogastric tube is looped in the proximal stomach. There is gas throughout the small and large bowel, but no significant bowel distension. There is no free intraperitoneal air. Surgical clips are present in the right upper quadrant of the abdomen. IMPRESSION: Orogastric tube is looped in the proximal stomach. Electronically Signed   By: Richardean Sale M.D.   On: 11/16/2018 14:05    Procedures .Critical Care Performed by: Lorayne Bender, PA-C Authorized by: Lorayne Bender, PA-C   Critical care provider statement:    Critical care time (minutes):  45   Critical care time was exclusive of:  Separately billable procedures and treating other patients   Critical care was necessary to treat or prevent imminent or life-threatening deterioration of the following conditions:  Renal failure   Critical care was time spent personally by me on the following activities:  Evaluation of patient's response to treatment, discussions with consultants, development of treatment plan with patient or surrogate, examination of patient, obtaining history from patient or surrogate, ordering and performing treatments and interventions, ordering and review of laboratory studies, ordering and review of radiographic studies, pulse oximetry, re-evaluation of patient's condition and review of old charts   I  assumed  direction of critical care for this patient from another provider in my specialty: no   Procedure Name: Intubation Date/Time: 11/16/2018 12:35 PM Performed by: Lorayne Bender, PA-C Pre-anesthesia Checklist: Patient identified, Patient being monitored, Emergency Drugs available, Suction available and Timeout performed Oxygen Delivery Method: Non-rebreather mask Preoxygenation: Pre-oxygenation with 100% oxygen Induction Type: Rapid sequence Ventilation: Mask ventilation without difficulty Laryngoscope Size: Glidescope and 3 Tube size: 7.5 mm Number of attempts: 1 Airway Equipment and Method: Rigid stylet and Video-laryngoscopy Placement Confirmation: ETT inserted through vocal cords under direct vision,  CO2 detector and Breath sounds checked- equal and bilateral Secured at: 23 cm Tube secured with: ETT holder      (including critical care time)  Medications Ordered in ED Medications  propofol (DIPRIVAN) 1000 MG/100ML infusion (has no administration in time range)  potassium chloride 10 mEq in 100 mL IVPB (0 mEq Intravenous Stopped 11/16/18 1536)  rifaximin (XIFAXAN) tablet 550 mg (has no administration in time range)  pantoprazole (PROTONIX) injection 40 mg (has no administration in time range)  sodium chloride flush (NS) 0.9 % injection 3 mL (has no administration in time range)  sodium chloride flush (NS) 0.9 % injection 3 mL (has no administration in time range)  0.9 %  sodium chloride infusion (has no administration in time range)  acetaminophen (TYLENOL) tablet 650 mg (has no administration in time range)  ondansetron (ZOFRAN) injection 4 mg (has no administration in time range)  albuterol (PROVENTIL) (2.5 MG/3ML) 0.083% nebulizer solution 2.5 mg (has no administration in time range)  levETIRAcetam (KEPPRA) IVPB 1000 mg/100 mL premix (has no administration in time range)  levothyroxine (SYNTHROID) tablet 100 mcg (has no administration in time range)  lactulose (CHRONULAC)  10 GM/15ML solution 30 g (has no administration in time range)  lactated ringers infusion (has no administration in time range)  fentaNYL (SUBLIMAZE) injection 25-100 mcg (has no administration in time range)  midazolam (VERSED) injection 1 mg (has no administration in time range)  sennosides (SENOKOT) 8.8 MG/5ML syrup 5 mL (has no administration in time range)  labetalol (NORMODYNE) 5 MG/ML injection (has no administration in time range)  aspirin chewable tablet 81 mg (has no administration in time range)  EPINEPHrine (ADRENALIN) 1 MG/10ML injection (has no administration in time range)  heparin bolus via infusion 3,000 Units (has no administration in time range)  heparin ADULT infusion 100 units/mL (25000 units/247mL sodium chloride 0.45%) (has no administration in time range)  labetalol (NORMODYNE) injection 10 mg (has no administration in time range)  sodium chloride flush (NS) 0.9 % injection 3 mL (3 mLs Intravenous Given 11/16/18 1235)  ondansetron (ZOFRAN) 4 MG/2ML injection (  Given 11/16/18 1236)  etomidate (AMIDATE) injection (15 mg Intravenous Given 11/16/18 1237)  rocuronium (ZEMURON) injection (60 mg Intravenous Given 11/16/18 1239)  sodium chloride 0.9 % bolus 1,686 mL (1,686 mLs Intravenous New Bag/Given 11/16/18 1321)  potassium chloride 20 MEQ/15ML (10%) solution 40 mEq (40 mEq Per Tube Given 11/16/18 1454)  labetalol (NORMODYNE) injection 20 mg (10 mg Intravenous Given 11/16/18 1511)     Initial Impression / Assessment and Plan / ED Course  I have reviewed the triage vital signs and the nursing notes.  Pertinent labs & imaging results that were available during my care of the patient were reviewed by me and considered in my medical decision making (see chart for details).  Clinical Course as of Nov 15 1552  Sun Nov 16, 2018  1305 Dr. Sedonia Small was able to speak with patient's niece  and wife. Patient has not been eating or drinking for two days due to vomiting. Confused today. Full  code.   [SJ]  1330 1.14 on 09/18/18  Creatinine(!): 3.46 [SJ]  1402 Spoke with critical care.    [SJ]  1856 Patient's pulse noted to be 84.  Pulse Rate(!): 34 [SJ]  3149 RN showed me this EKG. States she already showed it to the [facility].  I spoke with Dr. Alva Garnet. He confirms he has seen this EKG and is coordinating care with cardiology.   ED EKG 12-Lead [SJ]    Clinical Course User Index [SJ] Yoshio Seliga C, PA-C       Patient arrived with decreased responsiveness.  He was quickly sent to obtain head CT, accompanied by EDP, Dr. Venora Maples.  Upon returning, concern for patient's ability to control his airway due to vomiting combined with his decreased level of consciousness. Patient intubated.  Patient found to evidence of acidosis with low bicarb, low CO2.  Hypokalemic.  Admitted to critical care. Negative for coronavirus.  Ammonia high. Initial EKG with what appears to be sinus rhythm. Later in his ED course, his EKG showed ventricular bigeminy, however, native rhythm still sinus. After admission, no longer in bigeminy and ST elevation noted in each complex. I assured intensivist was aware of this change.     Findings and plan of care discussed with Gerlene Fee, MD. Dr. Sedonia Small personally evaluated and examined this patient.  Labs and radiological studies were personally reviewed by me. EKG reviewed by me and compared with previous EKGs, if available.  Vitals:   11/16/18 1315 11/16/18 1330 11/16/18 1345 11/16/18 1400  BP: (!) 160/136 (!) 158/101 (!) 172/78 (!) 179/78  Pulse: 84 75  (!) 34  Resp: 16 16  (!) 21  Temp:      TempSrc:      SpO2: 100% 100% 100% 100%  Weight:      Height:       Vitals:   11/16/18 1500 11/16/18 1515 11/16/18 1521 11/16/18 1551  BP: (!) 185/139 (!) 141/100    Pulse: (!) 43 69 63 67  Resp: (!) 28 19 17    Temp:      TempSrc:      SpO2: 100% 100% 100% 100%  Weight:      Height:         Final Clinical Impressions(s) / ED Diagnoses   Final  diagnoses:  Encephalopathy  AKI (acute kidney injury) [facility])    ED Discharge Orders    None       Layla Maw 11/16/18 1557    Maudie Flakes, MD 11/17/18 1123

## 2018-11-16 NOTE — Consult Note (Signed)
Cardiology Consultation:   Patient ID: Stephen Grimes MRN: [id number]; DOB: 1951-03-13  Admit date: 11/16/2018 Date of Consult: 11/16/2018  Primary Care Provider: Seward Carol, MD Primary Cardiologist: No primary care provider on file.  Primary Electrophysiologist:  None    Patient Profile:   Stephen Grimes is a 68 y.o. male with a hx of metastatic renal cell Ca, alcoholic cirrhosis who is being seen today for the evaluation of EKG changes at the request of Dr. Alva Garnet.  History of Present Illness:   Stephen Grimes is a 68 yo male with PMH noted above. Information is brief and obtained from chart as he is intubated. Lives at home with wife who has dementia. Notes indicated EMS was called to patient's home via [facility] after unable to reach patient. On arrival he was found to be altered with right sided gaze and vomiting. In the ED required intubation for airway protection.   CT head on admission was negative. COVID neg. Lab showed Na+ 136, K+ 3.4>>2.6, Cr 3.46, ammonia 133, AST 51, Hgb 13.0. ABG pH 7.3, CO2 17, pO2 463. CXR neg. EKG on arrival showed SR with TWI in inferolateral leads. EKG then progressed to evolving EKG changes with diffuse ST elevation. PCCM called for admission. Cardiology called in regards to EKG changes noted while in the ED.   Past Medical History:  Diagnosis Date   Alcoholic cirrhosis ([place])    Renal cancer ([place])     History reviewed. No pertinent surgical history.   Inpatient Medications: Scheduled Meds:  aspirin  81 mg Per Tube Daily   heparin  3,000 Units Intravenous Once   labetalol       lactulose  30 g Per Tube BID   [START ON 11/17/2018] levothyroxine  100 mcg Oral Q0600   pantoprazole (PROTONIX) IV  40 mg Intravenous QHS   rifaximin  550 mg Per Tube BID   sodium chloride flush  3 mL Intravenous Q12H   Continuous Infusions:  sodium chloride     heparin     lactated ringers     levETIRAcetam     potassium chloride Stopped  (11/16/18 1536)   propofol     PRN Meds: sodium chloride, acetaminophen, albuterol, fentaNYL (SUBLIMAZE) injection, labetalol, midazolam, ondansetron (ZOFRAN) IV, sennosides, sodium chloride flush  Allergies:   Not on File  Social History:   Social History   Socioeconomic History   Marital status: Married    Spouse name: Not on file   Number of children: Not on file   Years of education: Not on file   Highest education level: Not on file  Occupational History   Not on file  Social Needs   Financial resource strain: Not on file   Food insecurity:    Worry: Not on file    Inability: Not on file   Transportation needs:    Medical: Not on file    Non-medical: Not on file  Tobacco Use   Smoking status: Not on file  Substance and Sexual Activity   Alcohol use: Not on file   Drug use: Not on file   Sexual activity: Not on file  Lifestyle   Physical activity:    Days per week: Not on file    Minutes per session: Not on file   Stress: Not on file  Relationships   Social connections:    Talks on phone: Not on file    Gets together: Not on file    Attends religious service: Not on file  Active member of club or organization: Not on file    Attends meetings of clubs or organizations: Not on file    Relationship status: Not on file   Intimate partner violence:    Fear of current or ex partner: Not on file    Emotionally abused: Not on file    Physically abused: Not on file    Forced sexual activity: Not on file  Other Topics Concern   Not on file  Social History Narrative   Not on file    Family History:   History reviewed. No pertinent family history.   ROS:  Please see the history of present illness.   All other ROS reviewed and negative.     Physical Exam/Data:   Vitals:   11/16/18 1500 11/16/18 1515 11/16/18 1521 11/16/18 1551  BP: (!) 185/139 (!) 141/100    Pulse: (!) 43 69 63 67  Resp: (!) 28 19 17    Temp:      TempSrc:      SpO2:  100% 100% 100% 100%  Weight:      Height:        Intake/Output Summary (Last 24 hours) at 11/16/2018 1607 Last data filed at 11/16/2018 1536 Gross per 24 hour  Intake 111.06 ml  Output --  Net 111.06 ml   Last 3 Weights 11/16/2018  Weight (lbs) 124 lb  Weight (kg) 56.246 kg     Body mass index is 19.42 kg/m.   Physical Exam per MD.  General: Ill-appearing, cachectic intubated nonresponsive HEENT: Nonresponsive Lymph: no adenopathy Neck: no JVD noted Endocrine:  No thryomegaly Vascular: No carotid bruits Cardiac:  normal S1, S2; RRR; no obvious murmur, no rubs, occasional ectopy Lungs:   vent noise bilaterally Abd: soft, nontender, no hepatomegaly, prior abdominal scar noted Ext: no edema Musculoskeletal:  No deformities, BUE and BLE strength normal and equal Skin: Cool Neuro: Obtunded psych: Unable  EKG:  The EKG was personally reviewed and demonstrates:  SR with evolving ST changes. Most recent diffuse ST elevation noted.  Relevant CV Studies:   Laboratory Data:  Chemistry Recent Labs  Lab 11/16/18 1241 11/16/18 1246 11/16/18 1301 11/16/18 1402  NA 136  --  139 142  K 3.4*  --  3.5 2.6*  CL 112*  --   --   --   CO2 9*  --   --   --   GLUCOSE 96  --   --   --   BUN 72*  --   --   --   CREATININE 3.46* 3.80*  --   --   CALCIUM 9.9  --   --   --   GFRNONAA 17*  --   --   --   GFRAA 20*  --   --   --   ANIONGAP 15  --   --   --     Recent Labs  Lab 11/16/18 1241  PROT 8.7*  ALBUMIN 4.3  AST 51*  ALT 33  ALKPHOS 155*  BILITOT 1.1   Hematology Recent Labs  Lab 11/16/18 1241 11/16/18 1301 11/16/18 1402  WBC 5.7  --   --   RBC 4.47  --   --   HGB 13.0 13.9 10.5*  HCT 39.6 41.0 31.0*  MCV 88.6  --   --   MCH 29.1  --   --   MCHC 32.8  --   --   RDW 21.8*  --   --   PLT  181  --   --    Cardiac EnzymesNo results for input(s): TROPONINI in the last 168 hours. No results for input(s): TROPIPOC in the last 168 hours.  BNPNo results for input(s):  BNP, PROBNP in the last 168 hours.  DDimer No results for input(s): DDIMER in the last 168 hours.  Radiology/Studies:  Dg Abdomen 1 View  Result Date: 11/16/2018 CLINICAL DATA:  Repositioning of orogastric tube. EXAM: ABDOMEN - 1 VIEW COMPARISON:  Earlier the same day. FINDINGS: 1340 hours. The orogastric tube has been partially retracted, tip in the gastric fundus. No other significant changes are identified. There is gas throughout the small and large bowel, but no significant distension. Surgical clips are present in the right upper quadrant. IMPRESSION: Orogastric tube tip in the gastric fundus, less looped in the proximal stomach. Electronically Signed   By: Richardean Sale M.D.   On: 11/16/2018 14:07   Ct Head Wo Contrast  Result Date: 11/16/2018 CLINICAL DATA:  Found unresponsive. EXAM: CT HEAD WITHOUT CONTRAST TECHNIQUE: Contiguous axial images were obtained from the base of the skull through the vertex without intravenous contrast. COMPARISON:  None. FINDINGS: Brain: No evidence of acute infarction, hemorrhage, hydrocephalus, extra-axial collection or mass lesion/mass effect. Vascular: Calcification of the cavernous internal carotid arteries consistent with cerebrovascular atherosclerotic disease. No signs of intracranial large vessel occlusion. Skull: Normal. Negative for fracture or focal lesion. Sinuses/Orbits: No acute finding. Other: None. Electronically Signed   By: Staci Righter M.D.   On: 11/16/2018 13:29   Dg Chest Portable 1 View  Result Date: 11/16/2018 CLINICAL DATA:  Intubated patient. EXAM: PORTABLE CHEST 1 VIEW COMPARISON:  None. FINDINGS: 1314 hours. Tip of the endotracheal tube is in the mid trachea. An orogastric tube is looped in the proximal stomach. The heart size and mediastinal contours are normal. There is mild aortic atherosclerosis. The lungs are clear. There is no pleural effusion or pneumothorax. No acute osseous findings are evident. IMPRESSION: Satisfactory  position of the endotracheal and orogastric tubes. No acute cardiopulmonary process identified. Electronically Signed   By: Richardean Sale M.D.   On: 11/16/2018 14:06   Dg Abd Portable 1 View  Result Date: 11/16/2018 CLINICAL DATA:  Orogastric tube placement. EXAM: PORTABLE ABDOMEN - 1 VIEW COMPARISON:  None. FINDINGS: 1317 hours. Orogastric tube is looped in the proximal stomach. There is gas throughout the small and large bowel, but no significant bowel distension. There is no free intraperitoneal air. Surgical clips are present in the right upper quadrant of the abdomen. IMPRESSION: Orogastric tube is looped in the proximal stomach. Electronically Signed   By: Richardean Sale M.D.   On: 11/16/2018 14:05    Assessment and Plan:   Stephen Grimes is a 68 y.o. male with a hx of metastatic renal cell Ca, alcoholic cirrhosis who is being seen today for the evaluation of EKG changes at the request of Dr. Alva Garnet.      For questions or updates, please contact [place] Please consult [email or web address] for contact info under     Signed, Reino Bellis, NP  11/16/2018 4:07 PM   Personally seen and examined. Agree with above.   67 year old with metastatic renal cell carcinoma followed usually at [facility] with alcoholic cirrhosis who was obtunded today, unable to obtain history.  Intubated to maintain airway.  Ammonia level elevated.  Had EKGs which showed evolution of ST segment elevation diffusely in the inferior as well as the anterior leads with most pronounced elevation noted in leads  V2 and V3.  Wife with dementia, unable to obtain a history.  Bedside echocardiogram was performed.  This demonstrated a Takotsubo like appearance with ejection fraction of approximately 15 to 20% with basilar sparing.  Troponin value is not currently available.  On exam, ill-appearing obtunded, no significant pupillary reflexes, intubated, cachectic.  7.2 6/17/141 CO2-9 Creatinine 3.8-baseline  3 ALT 33 Total bilirubin 1.1 Potassium originally 3.4 Serum sodium 142 White blood cell count 5.7 Ammonia 133 Negative COVID-19  No acute findings CT scan  Assessment and plan  Abnormal EKG with diffuse ST segment elevation - Bedside echocardiogram demonstrates what looks like a Takotsubo type appearance with marked reduction in ejection fraction of approximately 15 to 20%.  Note, most recent echocardiogram on care everywhere in 2018 from [facility] demonstrated normal ejection fraction of 65.  Diffuse ST segment elevation usually is indicative of myocardial inflammation/pericarditis.  There was no significant pericardial effusion seen except perhaps may be small in the posterior segment.  Theoretically, a left main occlusion could cause global ST elevation however this is usually not compatible with life.  Regardless, extremely ill and high risk for further demise at this point with high mortality risk.  Given his presentation, he is not a candidate for invasive therapy.  I am certainly not opposed to IV heparin as was proposed by Dr. Alva Garnet.  Awaiting troponin values.  If ALT remains relatively normal, consider high intensity statin therapy for plaque stabilization.  Acute respiratory failure/obtundation - Last ABG demonstrates fairly significant metabolic acidosis with marked respiratory compensation with hyperventilation.  Vent changes per critical care medicine.  Hypokalemia -Replete potassium, try to maintain greater than 4 to prevent acute arrhythmias.  Chronic kidney disease stage IV  At this point, continue with aggressive supportive care.   Renal cell carcinoma metastatic  I discussed the case with critical care team in this gentleman with multisystem organ failure.  Critical care time 45 minutes spent with patient and collaboration with care team, extensive data review.  Note that there is a merge chart.  Candee Furbish, MD

## 2018-11-16 NOTE — Progress Notes (Signed)
CRITICAL VALUE ALERT  Critical Value:  LA 3.7 Trop 8.55 Serum osm 341  Date & Time Notied:  11/16/2018 1850  Provider Notified: Dr. Francis Dowse  Orders Received/Actions taken: 500 LR bolus once

## 2018-11-16 NOTE — ED Notes (Signed)
ED TO INPATIENT HANDOFF REPORT  ED Nurse Name and Phone #:   S Name/Age/Gender Stephen Grimes 68 y.o. male Room/Bed: 031C/031C  Code Status   Code Status: Full Code  Home/SNF/Other Is this baseline? no  Triage Complete: Triage complete  Chief Complaint unesponsive  Triage Note No notes on file   Allergies Not on File  Level of Care/Admitting Diagnosis ED Disposition    ED Disposition Condition [place]: Stephen Grimes [[id number]]  Level of Care: ICU [6]  Covid Evaluation: Person Under Investigation (PUI)  Isolation Risk Level: High Risk (Aerosolizing procedure, nebulizer, intubated/ventilation, CPAP/BiPAP)  Diagnosis: Acute respiratory failure ([place]) [518.81.ICD-9-CM]  Admitting Physician: Belinda Block  Attending Physician: Alva Garnet, DAVID B [email or web address]  Estimated length of stay: past midnight tomorrow  Certification:: I certify this patient will need inpatient services for at least 2 midnights  PT Class (Do Not Modify): Inpatient [101]  PT Acc Code (Do Not Modify): Private [1]       B Medical/Surgery History History reviewed. No pertinent past medical history. History reviewed. No pertinent surgical history.   A IV Location/Drains/Wounds Patient Lines/Drains/Airways Status   Active Line/Drains/Airways    Name:   Placement date:   Placement time:   Site:   Days:   Peripheral IV 11/16/18 Right Forearm   11/16/18    1233    Forearm   less than 1   Peripheral IV 11/16/18 Right Antecubital   11/16/18    1234    Antecubital   less than 1   Peripheral IV 11/16/18 Left Forearm   11/16/18    1234    Forearm   less than 1   NG/OG Tube Orogastric 16 Fr. Left mouth Xray;Aucultation Documented cm marking at nare/ corner of mouth   11/16/18    1248    Left mouth   less than 1   Airway 7.5 mm   11/16/18    1225     less than 1          Intake/Output Last 24 hours  Intake/Output Summary (Last 24 hours) at 11/16/2018  1519 Last data filed at 11/16/2018 1438 Gross per 24 hour  Intake 11.06 ml  Output -  Net 11.06 ml    Labs/Imaging Results for orders placed or performed during the hospital encounter of 11/16/18 (from the past 48 hour(s))  CBG monitoring, ED     Status: None   Collection Time: 11/16/18 12:27 PM  Result Value Ref Range   Glucose-Capillary 81 70 - 99 mg/dL   Comment 1 Notify RN    Comment 2 Document in Chart   Protime-INR     Status: None   Collection Time: 11/16/18 12:41 PM  Result Value Ref Range   Prothrombin Time 14.9 11.4 - 15.2 seconds   INR 1.2 0.8 - 1.2    Comment: (NOTE) INR goal varies based on device and disease states. Performed at [facility], [place] [address] [place], [place] [address]   APTT     Status: None   Collection Time: 11/16/18 12:41 PM  Result Value Ref Range   aPTT 30 24 - 36 seconds    Comment: Performed at [facility] [address], [place], [place] [address]  CBC     Status: Abnormal   Collection Time: 11/16/18 12:41 PM  Result Value Ref Range   WBC 5.7 4.0 - 10.5 K/uL   RBC 4.47 4.22 - 5.81  MIL/uL   Hemoglobin 13.0 13.0 - 17.0 g/dL   HCT 39.6 39.0 - 52.0 %   MCV 88.6 80.0 - 100.0 fL   MCH 29.1 26.0 - 34.0 pg   MCHC 32.8 30.0 - 36.0 g/dL   RDW 21.8 (H) 11.5 - 15.5 %   Platelets 181 150 - 400 K/uL   nRBC 0.0 0.0 - 0.2 %    Comment: Performed at [facility] [address], [place], [place] [address]  Differential     Status: None   Collection Time: 11/16/18 12:41 PM  Result Value Ref Range   Neutrophils Relative % 62 %   Neutro Abs 3.5 1.7 - 7.7 K/uL   Lymphocytes Relative 25 %   Lymphs Abs 1.4 0.7 - 4.0 K/uL   Monocytes Relative 12 %   Monocytes Absolute 0.7 0.1 - 1.0 K/uL   Eosinophils Relative 1 %   Eosinophils Absolute 0.0 0.0 - 0.5 K/uL   Basophils Relative 0 %   Basophils Absolute 0.0 0.0 - 0.1 K/uL   Immature Granulocytes 0 %   Abs Immature Granulocytes 0.02 0.00 - 0.07 K/uL    Comment: Performed at  [facility] [address], [place], [place] [address]  Comprehensive metabolic panel     Status: Abnormal   Collection Time: 11/16/18 12:41 PM  Result Value Ref Range   Sodium 136 135 - 145 mmol/L   Potassium 3.4 (L) 3.5 - 5.1 mmol/L   Chloride 112 (H) 98 - 111 mmol/L   CO2 9 (L) 22 - 32 mmol/L   Glucose, Bld 96 70 - 99 mg/dL   BUN 72 (H) 8 - 23 mg/dL   Creatinine, Ser 3.46 (H) 0.61 - 1.24 mg/dL   Calcium 9.9 8.9 - 10.3 mg/dL   Total Protein 8.7 (H) 6.5 - 8.1 g/dL   Albumin 4.3 3.5 - 5.0 g/dL   AST 51 (H) 15 - 41 U/L   ALT 33 0 - 44 U/L   Alkaline Phosphatase 155 (H) 38 - 126 U/L   Total Bilirubin 1.1 0.3 - 1.2 mg/dL   GFR calc non Af Amer 17 (L) >60 mL/min   GFR calc Af Amer 20 (L) >60 mL/min   Anion gap 15 5 - 15    Comment: Performed at [facility], [place] [address] [place], [place] [address]  Ammonia     Status: Abnormal   Collection Time: 11/16/18 12:41 PM  Result Value Ref Range   Ammonia 133 (H) 9 - 35 umol/L    Comment: SLIGHT HEMOLYSIS Performed at [facility], [place] [address], [place],  [address]   I-stat Creatinine, ED     Status: Abnormal   Collection Time: 11/16/18 12:46 PM  Result Value Ref Range   Creatinine, Ser 3.80 (H) 0.61 - 1.24 mg/dL  POCT I-Stat EG7     Status: Abnormal   Collection Time: 11/16/18  1:01 PM  Result Value Ref Range   pH, Ven 7.267 7.250 - 7.430   pCO2, Ven 20.9 (L) 44.0 - 60.0 mmHg   pO2, Ven 83.0 (H) 32.0 - 45.0 mmHg   Bicarbonate 9.6 (L) 20.0 - 28.0 mmol/L   TCO2 10 (L) 22 - 32 mmol/L   O2 Saturation 95.0 %   Acid-base deficit 15.0 (H) 0.0 - 2.0 mmol/L   Sodium 139 135 - 145 mmol/L   Potassium 3.5 3.5 - 5.1 mmol/L   Calcium, Ion 1.20 1.15 - 1.40 mmol/L   HCT  41.0 39.0 - 52.0 %   Hemoglobin 13.9 13.0 - 17.0 g/dL   Patient temperature HIDE    Sample type VENOUS   SARS Coronavirus 2 [facility] order, Performed in [place] hospital lab)     Status: None   Collection Time: 11/16/18  1:12 PM  Result  Value Ref Range   SARS Coronavirus 2 NEGATIVE NEGATIVE    Comment: (NOTE) If result is NEGATIVE SARS-CoV-2 target nucleic acids are NOT DETECTED. The SARS-CoV-2 RNA is generally detectable in upper and lower  respiratory specimens during the acute phase of infection. The lowest  concentration of SARS-CoV-2 viral copies this assay can detect is 250  copies / mL. A negative result does not preclude SARS-CoV-2 infection  and should not be used as the sole basis for treatment or other  patient management decisions.  A negative result may occur with  improper specimen collection / handling, submission of specimen other  than nasopharyngeal swab, presence of viral mutation(s) within the  areas targeted by this assay, and inadequate number of viral copies  (<250 copies / mL). A negative result must be combined with clinical  observations, patient history, and epidemiological information. If result is POSITIVE SARS-CoV-2 target nucleic acids are DETECTED. The SARS-CoV-2 RNA is generally detectable in upper and lower  respiratory specimens dur ing the acute phase of infection.  Positive  results are indicative of active infection with SARS-CoV-2.  Clinical  correlation with patient history and other diagnostic information is  necessary to determine patient infection status.  Positive results do  not rule out bacterial infection or co-infection with other viruses. If result is PRESUMPTIVE POSTIVE SARS-CoV-2 nucleic acids MAY BE PRESENT.   A presumptive positive result was obtained on the submitted specimen  and confirmed on repeat testing.  While 2019 novel coronavirus  (SARS-CoV-2) nucleic acids may be present in the submitted sample  additional confirmatory testing may be necessary for epidemiological  and / or clinical management purposes  to differentiate between  SARS-CoV-2 and other Sarbecovirus currently known to infect humans.  If clinically indicated additional testing with an  alternate test  methodology [phone]) is advised. The SARS-CoV-2 RNA is generally  detectable in upper and lower respiratory sp ecimens during the acute  phase of infection. The expected result is Negative. Fact Sheet for Patients:  StrictlyIdeas.no Fact Sheet for Healthcare Providers: BankingDealers.co.za This test is not yet approved or cleared by the [place] FDA and has been authorized for detection and/or diagnosis of SARS-CoV-2 by FDA under an Emergency Use Authorization (EUA).  This EUA will remain in effect (meaning this test can be used) for the duration of the COVID-19 declaration under Section 564(b)(1) of the Act, 21 U.S.C. section 360bbb-3(b)(1), unless the authorization is terminated or revoked sooner. Performed at [facility], [place] [address] [place], [place] [address]   I-STAT 7, (LYTES, BLD GAS, ICA, H+H)     Status: Abnormal   Collection Time: 11/16/18  2:02 PM  Result Value Ref Range   pH, Arterial 7.376 7.350 - 7.450   pCO2 arterial 17.1 (LL) 32.0 - 48.0 mmHg   pO2, Arterial 463.0 (H) 83.0 - 108.0 mmHg   Bicarbonate 10.8 (L) 20.0 - 28.0 mmol/L   TCO2 12 (L) 22 - 32 mmol/L   O2 Saturation 100.0 %   Acid-base deficit 14.0 (H) 0.0 - 2.0 mmol/L   Sodium 142 135 - 145 mmol/L   Potassium 2.6 (LL) 3.5 - 5.1 mmol/L   Calcium, Ion 1.23  1.15 - 1.40 mmol/L   HCT 31.0 (L) 39.0 - 52.0 %   Hemoglobin 10.5 (L) 13.0 - 17.0 g/dL   Patient temperature 86.8 F    Collection site RADIAL, ALLEN'S TEST ACCEPTABLE    Drawn by Operator    Sample type ARTERIAL    Comment NOTIFIED PHYSICIAN    Dg Abdomen 1 View  Result Date: 11/16/2018 CLINICAL DATA:  Repositioning of orogastric tube. EXAM: ABDOMEN - 1 VIEW COMPARISON:  Earlier the same day. FINDINGS: 1340 hours. The orogastric tube has been partially retracted, tip in the gastric fundus. No other significant changes are identified. There is gas throughout the small and  large bowel, but no significant distension. Surgical clips are present in the right upper quadrant. IMPRESSION: Orogastric tube tip in the gastric fundus, less looped in the proximal stomach. Electronically Signed   By: Richardean Sale M.D.   On: 11/16/2018 14:07   Ct Head Wo Contrast  Result Date: 11/16/2018 CLINICAL DATA:  Found unresponsive. EXAM: CT HEAD WITHOUT CONTRAST TECHNIQUE: Contiguous axial images were obtained from the base of the skull through the vertex without intravenous contrast. COMPARISON:  None. FINDINGS: Brain: No evidence of acute infarction, hemorrhage, hydrocephalus, extra-axial collection or mass lesion/mass effect. Vascular: Calcification of the cavernous internal carotid arteries consistent with cerebrovascular atherosclerotic disease. No signs of intracranial large vessel occlusion. Skull: Normal. Negative for fracture or focal lesion. Sinuses/Orbits: No acute finding. Other: None. Electronically Signed   By: Staci Righter M.D.   On: 11/16/2018 13:29   Dg Chest Portable 1 View  Result Date: 11/16/2018 CLINICAL DATA:  Intubated patient. EXAM: PORTABLE CHEST 1 VIEW COMPARISON:  None. FINDINGS: 1314 hours. Tip of the endotracheal tube is in the mid trachea. An orogastric tube is looped in the proximal stomach. The heart size and mediastinal contours are normal. There is mild aortic atherosclerosis. The lungs are clear. There is no pleural effusion or pneumothorax. No acute osseous findings are evident. IMPRESSION: Satisfactory position of the endotracheal and orogastric tubes. No acute cardiopulmonary process identified. Electronically Signed   By: Richardean Sale M.D.   On: 11/16/2018 14:06   Dg Abd Portable 1 View  Result Date: 11/16/2018 CLINICAL DATA:  Orogastric tube placement. EXAM: PORTABLE ABDOMEN - 1 VIEW COMPARISON:  None. FINDINGS: 1317 hours. Orogastric tube is looped in the proximal stomach. There is gas throughout the small and large bowel, but no significant bowel  distension. There is no free intraperitoneal air. Surgical clips are present in the right upper quadrant of the abdomen. IMPRESSION: Orogastric tube is looped in the proximal stomach. Electronically Signed   By: Richardean Sale M.D.   On: 11/16/2018 14:05    Pending Labs Unresulted Labs (From admission, onward)    Start     Ordered   11/17/18 0500  Comprehensive metabolic panel  Tomorrow morning,   R     11/16/18 1450   11/17/18 0500  CBC  Tomorrow morning,   R     11/16/18 1450   11/17/18 0500  Protime-INR  Tomorrow morning,   R     11/16/18 1450   11/17/18 6294  Basic metabolic panel  Tomorrow morning,   R     11/16/18 1452   11/17/18 0500  Magnesium  Tomorrow morning,   R     11/16/18 1458   11/17/18 0500  Phosphorus  Tomorrow morning,   R     11/16/18 1458   11/17/18 0500  Ammonia  Tomorrow morning,   R  11/16/18 1458   11/16/18 8546  Basic metabolic panel  Once,   R     11/16/18 1452   11/16/18 1530  Blood gas, arterial  Once,   R     11/16/18 1452   11/16/18 1509  Troponin I - Now Then Q6H  Now then every 6 hours,   R     11/16/18 1508   11/16/18 1500  Acetaminophen level  ONCE - STAT,   R     11/16/18 1459   27/03/50 0938  Salicylate level  ONCE - STAT,   R     11/16/18 1459   11/16/18 1455  Osmolality  ONCE - STAT,   R     11/16/18 1454   11/16/18 1450  Volatiles,Blood (acetone,ethanol,isoprop,methanol)  Once,   R     11/16/18 1452   11/16/18 1449  Lactic acid, plasma  Once,   STAT     11/16/18 1448   11/16/18 1443  Urine drugs of abuse scrn w alc, routine (Ref Lab)  Once,   R     11/16/18 1452   11/16/18 1441  Troponin I - Once  Once,   R     11/16/18 1452   11/16/18 1441  TSH  Once,   R     11/16/18 1452   11/16/18 1438  Cortisol  ONCE - STAT,   STAT     11/16/18 1452   11/16/18 1438  Urinalysis, Routine w reflex microscopic  Once,   R     11/16/18 1452   11/16/18 1437  Procalcitonin  Once,   R     11/16/18 1452   11/16/18 1431  HIV antibody (Routine  Testing)  Once,   R     11/16/18 1452   11/16/18 1430  Urine culture  ONCE - STAT,   STAT     11/16/18 1430   11/16/18 1429  Blood Culture (routine x 2)  BLOOD CULTURE X 2,   STAT     11/16/18 1429          Vitals/Pain Today's Vitals   11/16/18 1400 11/16/18 1415 11/16/18 1430 11/16/18 1445  BP: (!) 179/78 (!) 179/80 (!) 174/83 (!) 175/83  Pulse: (!) 34 (!) 39 (!) 37 (!) 39  Resp: (!) 21 (!) 21 (!) 29 (!) 23  Temp:      TempSrc:      SpO2: 100% 100% 100% 100%  Weight:      Height:        Isolation Precautions Droplet and Contact precautions  Medications Medications  propofol (DIPRIVAN) 1000 MG/100ML infusion (has no administration in time range)  potassium chloride 10 mEq in 100 mL IVPB (10 mEq Intravenous New Bag/Given 11/16/18 1441)  rifaximin (XIFAXAN) tablet 550 mg (has no administration in time range)  heparin injection 5,000 Units (has no administration in time range)  pantoprazole (PROTONIX) injection 40 mg (has no administration in time range)  sodium chloride flush (NS) 0.9 % injection 3 mL (has no administration in time range)  sodium chloride flush (NS) 0.9 % injection 3 mL (has no administration in time range)  0.9 %  sodium chloride infusion (has no administration in time range)  acetaminophen (TYLENOL) tablet 650 mg (has no administration in time range)  ondansetron (ZOFRAN) injection 4 mg (has no administration in time range)  albuterol (PROVENTIL) (2.5 MG/3ML) 0.083% nebulizer solution 2.5 mg (has no administration in time range)  levETIRAcetam (KEPPRA) IVPB 1000 mg/100 mL premix (has no administration  in time range)  levothyroxine (SYNTHROID) tablet 100 mcg (has no administration in time range)  lactulose (CHRONULAC) 10 GM/15ML solution 30 g (has no administration in time range)  lactated ringers infusion (has no administration in time range)  fentaNYL (SUBLIMAZE) injection 25-100 mcg (has no administration in time range)  midazolam (VERSED) injection 1 mg  (has no administration in time range)  sennosides (SENOKOT) 8.8 MG/5ML syrup 5 mL (has no administration in time range)  labetalol (NORMODYNE) 5 MG/ML injection (has no administration in time range)  sodium chloride flush (NS) 0.9 % injection 3 mL (3 mLs Intravenous Given 11/16/18 1235)  ondansetron (ZOFRAN) 4 MG/2ML injection (  Given 11/16/18 1236)  etomidate (AMIDATE) injection (15 mg Intravenous Given 11/16/18 1237)  rocuronium (ZEMURON) injection (60 mg Intravenous Given 11/16/18 1239)  sodium chloride 0.9 % bolus 1,686 mL (1,686 mLs Intravenous New Bag/Given 11/16/18 1321)  potassium chloride 20 MEQ/15ML (10%) solution 40 mEq (40 mEq Per Tube Given 11/16/18 1454)  labetalol (NORMODYNE) injection 20 mg (10 mg Intravenous Given 11/16/18 1511)    Mobility  High fall risk   Focused Assessments    R Recommendations: See Admitting Provider Note  Report given to:   Additional Notes:

## 2018-11-16 NOTE — Progress Notes (Addendum)
[place] for heparin Indication: chest pain/ACS  Heparin Dosing Weight: 56.2 kg  Labs: Recent Labs    11/16/18 1241 11/16/18 1246 11/16/18 1301 11/16/18 1402  HGB 13.0  --  13.9 10.5*  HCT 39.6  --  41.0 31.0*  PLT 181  --   --   --   APTT 30  --   --   --   LABPROT 14.9  --   --   --   INR 1.2  --   --   --   CREATININE 3.46* 3.80*  --   --     Assessment: 12 yom presenting with AMS, vomiting, elevated BP. Pt intubated in ER, CT head neg for bleed. Pharmacy consulted to dose heparin for ACS. No anticoagulation PTA documented in fax sent from patient's pharmacy. PTA. Hg 10.5, plt wnl. No active bleed issues documented.  Goal of Therapy:  Heparin level 0.3-0.7 units/ml Monitor platelets by anticoagulation protocol: Yes   Plan:  Heparin 3000 unit bolus Start heparin at 700 units/h 8h heparin level Daily heparin level/CBC Monitor s/sx bleeding  Elicia Lamp, PharmD, BCPS Clinical Pharmacist 11/16/2018 3:37 PM

## 2018-11-16 NOTE — Consult Note (Addendum)
Neurology Consultation  Reason for Consult: AMS/ Metabolic encephalopathy  Referring Physician: Dr. Alva Garnet   CC: AMS  History is obtained from: Chart review, old chart/ new chart - this is not merged currently   HPI: Stephen Grimes is a 68 y.o. male with a PMH of of thyroid disease, OSA, renal im[pairment with removal of right kidney, HTN, COPD, Cirrosis, and metastatic renal cell carcinoma last treated on cabozantinab for chemotherapy which subsequently resulted in ongoing loose stools and diarrhea noted back as far as 07/2018 outpatient oncology notation. He'd been considered for possible treatment with consistent growth of his parotid glands. His unfortunate disease course resulted in forgetfulness, memory decline and suspicion for metastatic brain disease. This was not confirmed on MRI brain 08/2018. Moving forward he'd been hospitalized and overcame dehydration 08/2018 as well.   Admission- and neuro consultation  He is present in the ER today after experiencing N/V, AMS. He was transferred Via EMS and found to have hypertensive urgency, resp insufficiency, AMS that progressively became worse in the presence of vomiting that lead to  Intubation for protection of his airway.  CT brain was negative for acute findings. There was evidence of hyperammoniaemia 133,  AST 155, ALP 155, AST 51, mild hypokalemia K 3.4, BUN 72, Cr 3.46. Monitor reveals signs of ectopy and ST elevation indication MI. There is no family at the bedside. He was given labetalol 10 mg IV for his ectopy and hypertensive urgency 190's which shortly after improved Bp and HR, but his ST elevation is evident.  Presumably he lives with his wife at home who herself has dementia and was not able to provide any history when prior providers attempted to obtain history.   ROS:  Unable to obtain due to altered mental status.   Past Medical History:  Diagnosis Date  . Alcoholic cirrhosis ([place])   . Cirrhosis ([place])   . HTN  (hypertension)   . Metastatic renal cell carcinoma ([place])   . Renal cancer ([place])   . Renal impairment   . Thyroid disease      History reviewed. No pertinent family history. Unknown at this time   Social History:   has no history on file for tobacco, alcohol, and drug. Lives with family, unable to obtain any further.  Medications  Current Facility-Administered Medications:  .  0.9 %  sodium chloride infusion, 250 mL, Intravenous, PRN, Parrett, Tammy S, NP .  acetaminophen (TYLENOL) tablet 650 mg, 650 mg, Oral, Q4H PRN, Parrett, Tammy S, NP .  albuterol (PROVENTIL) (2.5 MG/3ML) 0.083% nebulizer solution 2.5 mg, 2.5 mg, Nebulization, Q2H PRN, Parrett, Tammy S, NP .  aspirin chewable tablet 81 mg, 81 mg, Per Tube, Daily, Simonds, David B, MD .  fentaNYL (SUBLIMAZE) injection 25-100 mcg, 25-100 mcg, Intravenous, Q30 min PRN, Parrett, Tammy S, NP .  heparin ADULT infusion 100 units/mL (25000 units/250mL sodium chloride 0.45%), 700 Units/hr, Intravenous, Continuous, Romona Curls, RPH .  heparin bolus via infusion 3,000 Units, 3,000 Units, Intravenous, Once, Romona Curls, RPH .  labetalol (NORMODYNE) 5 MG/ML injection, , , ,  .  labetalol (NORMODYNE) injection 10 mg, 10 mg, Intravenous, Q2H PRN, Wilhelmina Mcardle, MD .  lactated ringers infusion, , Intravenous, Continuous, Wilhelmina Mcardle, MD .  lactulose (CHRONULAC) 10 GM/15ML solution 30 g, 30 g, Per Tube, BID, Parrett, Tammy S, NP .  levETIRAcetam (KEPPRA) IVPB 1000 mg/100 mL premix, 1,000 mg, Intravenous, Once, Parrett, Tammy S, NP .  [START ON 11/17/2018] levothyroxine (SYNTHROID)  tablet 100 mcg, 100 mcg, Oral, Q0600, Parrett, Tammy S, NP .  midazolam (VERSED) injection 1 mg, 1 mg, Intravenous, Q2H PRN, Parrett, Tammy S, NP .  ondansetron (ZOFRAN) injection 4 mg, 4 mg, Intravenous, Q6H PRN, Parrett, Tammy S, NP .  pantoprazole (PROTONIX) injection 40 mg, 40 mg, Intravenous, QHS, Parrett, Tammy S, NP .  potassium chloride 10 mEq in  100 mL IVPB, 10 mEq, Intravenous, Q1 Hr x 2, Parrett, Tammy S, NP, Stopped at 11/16/18 1536 .  propofol (DIPRIVAN) 1000 MG/100ML infusion, , , ,  .  rifaximin (XIFAXAN) tablet 550 mg, 550 mg, Per Tube, BID, Wilhelmina Mcardle, MD .  sennosides (SENOKOT) 8.8 MG/5ML syrup 5 mL, 5 mL, Per Tube, BID PRN, Parrett, Tammy S, NP .  sodium chloride flush (NS) 0.9 % injection 3 mL, 3 mL, Intravenous, Q12H, Parrett, Tammy S, NP .  sodium chloride flush (NS) 0.9 % injection 3 mL, 3 mL, Intravenous, PRN, Parrett, Tammy S, NP   Exam: Current vital signs: BP (!) 141/100   Pulse 67   Temp (!) 96.4 F (35.8 C) (Rectal)   Resp 17   Ht 5\' 7"  (1.702 m)   Wt 56.2 kg   SpO2 100%   BMI 19.42 kg/m  Vital signs in last 24 hours: Temp:  [96.4 F (35.8 C)] 96.4 F (35.8 C) (04/26 1307) Pulse Rate:  [34-106] 67 (04/26 1551) Resp:  [16-29] 17 (04/26 1521) BP: (137-199)/(78-151) 141/100 (04/26 1515) SpO2:  [92 %-100 %] 100 % (04/26 1551) FiO2 (%):  [45 %-100 %] 45 % (04/26 1551) Weight:  [56.2 kg] 56.2 kg (04/26 1250) GENERAL: Eyes open, blank stare without response, cachectic appearance and malnourished  HEENT: - Normocephalic and atraumatic, scleral icterus and a yellowish hue to facial skin LUNGS - ventilated with ETT CV - ectopy with ST elevation on EKG and monitor  ABDOMEN - flat  Ext: warm, no overt edema  NEURO:  Mental Status: He is intubated, has been given medications for RSI, completely unresponsive to voice.  No response to noxious stimulation. Does not follow any commands. Cranial nerves: Pupils are equal round very sluggishly reactive to light at 2 mm.  Difficult ascertain facial symmetry, absent corneal reflexes, absent oculocephalics. Motor exam: Flaccid throughout with no spontaneous movements or withdrawal to noxious simulation. Sensory exam: As above  Labs I have reviewed labs in epic and the results pertinent to this consultation are:  There was evidence of hyperammoniaemia 133,  AST  155, ALP 155, AST 51, mild hypokalemia K 3.4, BUN 72, Cr 3.46. Monitor reveals signs of ectopy and ST elevation indication MI. There is no family at the bedside.   CBC    Component Value Date/Time   WBC 5.7 11/16/2018 1241   RBC 4.47 11/16/2018 1241   HGB 10.5 (L) 11/16/2018 1402   HCT 31.0 (L) 11/16/2018 1402   PLT 181 11/16/2018 1241   MCV 88.6 11/16/2018 1241   MCH 29.1 11/16/2018 1241   MCHC 32.8 11/16/2018 1241   RDW 21.8 (H) 11/16/2018 1241   LYMPHSABS 1.4 11/16/2018 1241   MONOABS 0.7 11/16/2018 1241   EOSABS 0.0 11/16/2018 1241   BASOSABS 0.0 11/16/2018 1241    CMP     Component Value Date/Time   NA 142 11/16/2018 1402   K 2.6 (LL) 11/16/2018 1402   CL 112 (H) 11/16/2018 1241   CO2 9 (L) 11/16/2018 1241   GLUCOSE 96 11/16/2018 1241   BUN 72 (H) 11/16/2018 1241   CREATININE  3.80 (H) 11/16/2018 1246   CALCIUM 9.9 11/16/2018 1241   PROT 8.7 (H) 11/16/2018 1241   ALBUMIN 4.3 11/16/2018 1241   AST 51 (H) 11/16/2018 1241   ALT 33 11/16/2018 1241   ALKPHOS 155 (H) 11/16/2018 1241   BILITOT 1.1 11/16/2018 1241   GFRNONAA 17 (L) 11/16/2018 1241   GFRAA 20 (L) 11/16/2018 1241   Imaging I have reviewed the images obtained: CT-scan of the brain Brain: No evidence of acute infarction, hemorrhage, hydrocephalus, extra-axial collection or mass lesion/mass effect.  Vascular: Calcification of the cavernous internal carotid arteries consistent with cerebrovascular atherosclerotic disease. No signs of intracranial large vessel occlusion.  Skull: Normal. Negative for fracture or focal lesion.  Sinuses/Orbits: No acute finding.  Bedside echocardiogram per cardiology with 15 to 20% ejection fraction and Takotsubo type appearance.  Assessment: Stephen Grimes is a 68 y.o. male with a PMH of of thyrpid diseae, OSA, renal im[pairment with removal of right kidney, HTN, COPD, Cirrosis, and metastatic renal cell carcinoma last treated on cabozantinab for chemotherapy which  subsequently resulted in ongoing loose stools and diarrhea.  present in the ER today after experiencing N/V, AMS. He was trasferred Via EMS and found to have hypertensive urgency, resp insufficiency, AMS that progressively became worse in the presence of vomiting that lead to  Intubation for protection of his airway.  CT brain was negative for acute findings. Evidence of elevated liver function, ammonia, hypokalemia, and impaired heart function   Impression: Toxic/metabolic encephalopathy and metabolic disarray, MI, in the presence of metastatic CA Evaluate for stroke-unknown last known normal hence not a candidate for any intervention.  Recommendations: -Medical management and correction of toxic metabolic derangements. -EEG in the morning -MRI of the brain when possible -Management of the MI per cardiology and PCCM. -Management of the liver disease per primary team.  Management of renal disease per primary team. -Does not appear to be in status clinically-exam is marred by prior medication administration for intubation and in the setting of poor renal function, possibly still not metabolizing paralytics/sedatives. - Conversation with family for goals of care - Palliative consult in the presence of metastatic disease    Joaquim Lai NP- Neuro-hospitalist    Attending Neurohospitalist Addendum Patient seen and examined with APP/Resident. Agree with the history and physical as documented above. Agree with the plan as documented, which I helped formulate. I have independently reviewed the chart, obtained history, review of systems and examined the patient.I have personally reviewed pertinent head/neck/spine imaging (CT/MRI). Please feel free to call with any questions. --- Amie Portland, MD [facility] Pager: [phone]  If 7pm to 7am, please call on call as listed on AMION.    CRITICAL CARE ATTESTATION Performed by: Amie Portland, MD Total critical care time: 35  minutes Critical care time was exclusive of separately billable procedures and treating other patients and/or supervising APPs/Residents/Students Critical care was necessary to treat or prevent imminent or life-threatening deterioration due to toxic metabolic encephalopathy This patient is critically ill and at significant risk for neurological worsening and/or death and care requires constant monitoring. Critical care was time spent personally by me on the following activities: development of treatment plan with patient and/or surrogate as well as nursing, discussions with consultants, evaluation of patient's response to treatment, examination of patient, obtaining history from patient or surrogate, ordering and performing treatments and interventions, ordering and review of laboratory studies, ordering and review of radiographic studies, pulse oximetry, re-evaluation of patient's condition, participation in multidisciplinary rounds and medical  decision making of high complexity in the care of this patient.

## 2018-11-16 NOTE — ED Notes (Addendum)
Per EMS- pt niece called from remote location for EMS to go to house. Wife has dementia. Pt was found altered, right sided gaze. No purposeful movement. Pt actively vomiting on arrival. Pt examined by MD and PA, pt taken to CT with RN, EMT and MD.

## 2018-11-16 NOTE — ED Notes (Signed)
Pt emergently transported to CT for CT of head

## 2018-11-16 NOTE — Progress Notes (Signed)
RT note: RT and RN transported patient on ventilator from ED to 3M13. Vital signs stable through out.

## 2018-11-17 ENCOUNTER — Inpatient Hospital Stay (HOSPITAL_COMMUNITY): Payer: Medicare Other

## 2018-11-17 DIAGNOSIS — I361 Nonrheumatic tricuspid (valve) insufficiency: Secondary | ICD-10-CM

## 2018-11-17 DIAGNOSIS — I340 Nonrheumatic mitral (valve) insufficiency: Secondary | ICD-10-CM

## 2018-11-17 DIAGNOSIS — I2109 ST elevation (STEMI) myocardial infarction involving other coronary artery of anterior wall: Secondary | ICD-10-CM

## 2018-11-17 DIAGNOSIS — G9340 Encephalopathy, unspecified: Secondary | ICD-10-CM

## 2018-11-17 DIAGNOSIS — K7291 Hepatic failure, unspecified with coma: Secondary | ICD-10-CM

## 2018-11-17 LAB — CBC
HCT: 35.1 % — ABNORMAL LOW (ref 39.0–52.0)
Hemoglobin: 11.8 g/dL — ABNORMAL LOW (ref 13.0–17.0)
MCH: 29.4 pg (ref 26.0–34.0)
MCHC: 33.6 g/dL (ref 30.0–36.0)
MCV: 87.5 fL (ref 80.0–100.0)
Platelets: 125 10*3/uL — ABNORMAL LOW (ref 150–400)
RBC: 4.01 MIL/uL — ABNORMAL LOW (ref 4.22–5.81)
RDW: 22.2 % — ABNORMAL HIGH (ref 11.5–15.5)
WBC: 7.5 10*3/uL (ref 4.0–10.5)
nRBC: 0 % (ref 0.0–0.2)

## 2018-11-17 LAB — COMPREHENSIVE METABOLIC PANEL
ALT: 30 U/L (ref 0–44)
AST: 94 U/L — ABNORMAL HIGH (ref 15–41)
Albumin: 3.3 g/dL — ABNORMAL LOW (ref 3.5–5.0)
Alkaline Phosphatase: 121 U/L (ref 38–126)
Anion gap: 14 (ref 5–15)
BUN: 60 mg/dL — ABNORMAL HIGH (ref 8–23)
CO2: 8 mmol/L — ABNORMAL LOW (ref 22–32)
Calcium: 8.8 mg/dL — ABNORMAL LOW (ref 8.9–10.3)
Chloride: 120 mmol/L — ABNORMAL HIGH (ref 98–111)
Creatinine, Ser: 3 mg/dL — ABNORMAL HIGH (ref 0.61–1.24)
GFR calc Af Amer: 23 mL/min — ABNORMAL LOW (ref >60)
GFR calc non Af Amer: 20 mL/min — ABNORMAL LOW (ref >60)
Glucose, Bld: 87 mg/dL (ref 70–99)
Potassium: 4 mmol/L (ref 3.5–5.1)
Sodium: 142 mmol/L (ref 135–145)
Total Bilirubin: 1.3 mg/dL — ABNORMAL HIGH (ref 0.3–1.2)
Total Protein: 7.1 g/dL (ref 6.5–8.1)

## 2018-11-17 LAB — VOLATILES,BLD-ACETONE,ETHANOL,ISOPROP,METHANOL
Acetone, blood: 0.001 % (ref 0.000–0.010)
Ethanol, blood: 0 % (ref 0.000–0.010)
Isopropanol, blood: 0 % (ref 0.000–0.010)
Methanol, blood: 0 % (ref 0.000–0.010)

## 2018-11-17 LAB — GLUCOSE, CAPILLARY
Glucose-Capillary: 112 mg/dL — ABNORMAL HIGH (ref 70–99)
Glucose-Capillary: 64 mg/dL — ABNORMAL LOW (ref 70–99)
Glucose-Capillary: 72 mg/dL (ref 70–99)
Glucose-Capillary: 72 mg/dL (ref 70–99)
Glucose-Capillary: 75 mg/dL (ref 70–99)
Glucose-Capillary: 85 mg/dL (ref 70–99)
Glucose-Capillary: 88 mg/dL (ref 70–99)

## 2018-11-17 LAB — HEPARIN LEVEL (UNFRACTIONATED)
Heparin Unfractionated: 1.84 IU/mL — ABNORMAL HIGH (ref 0.30–0.70)
Heparin Unfractionated: 1.92 IU/mL — ABNORMAL HIGH (ref 0.30–0.70)
Heparin Unfractionated: 1.1 IU/mL — ABNORMAL HIGH (ref 0.30–0.70)

## 2018-11-17 LAB — URINE CULTURE: Culture: NO GROWTH

## 2018-11-17 LAB — ECHOCARDIOGRAM LIMITED
Height: 67 in
Weight: 1883.61 oz

## 2018-11-17 LAB — TROPONIN I
Troponin I: 16.76 ng/mL (ref <0.03)
Troponin I: 15.79 ng/mL (ref <0.03)

## 2018-11-17 LAB — MAGNESIUM: Magnesium: 1.3 mg/dL — ABNORMAL LOW (ref 1.7–2.4)

## 2018-11-17 LAB — AMMONIA: Ammonia: 62 umol/L — ABNORMAL HIGH (ref 9–35)

## 2018-11-17 LAB — PROTIME-INR
INR: 1.5 — ABNORMAL HIGH (ref 0.8–1.2)
Prothrombin Time: 17.8 seconds — ABNORMAL HIGH (ref 11.4–15.2)

## 2018-11-17 LAB — PHOSPHORUS: Phosphorus: 3.9 mg/dL (ref 2.5–4.6)

## 2018-11-17 LAB — HIV ANTIBODY (ROUTINE TESTING W REFLEX): HIV Screen 4th Generation wRfx: NONREACTIVE

## 2018-11-17 MED ORDER — PRO-STAT SUGAR FREE PO LIQD: 30.0000 mL | Freq: Two times a day (BID) | ORAL | Status: DC

## 2018-11-17 MED ORDER — HEPARIN (PORCINE) 25000 UT/250ML-% IV SOLN
500.0000 [IU]/h | INTRAVENOUS | Status: DC
  Administered 2018-11-17: 500 [IU]/h via INTRAVENOUS

## 2018-11-17 MED ORDER — MAGNESIUM SULFATE 2 GM/50ML IV SOLN
2.0000 g | Freq: Once | INTRAVENOUS | Status: AC
  Administered 2018-11-17: 2 g via INTRAVENOUS
  Filled 2018-11-17: qty 50

## 2018-11-17 MED ORDER — METOPROLOL TARTRATE 12.5 MG HALF TABLET
12.5000 mg | ORAL_TABLET | Freq: Two times a day (BID) | ORAL | Status: DC
  Administered 2018-11-17 – 2018-11-23 (×11): 12.5 mg via ORAL
  Filled 2018-11-17 (×11): qty 1

## 2018-11-17 MED ORDER — THIAMINE HCL 100 MG/ML IJ SOLN
100.0000 mg | Freq: Every day | INTRAMUSCULAR | Status: DC
  Administered 2018-11-17 – 2018-11-21 (×5): 100 mg via INTRAVENOUS
  Filled 2018-11-17 (×5): qty 2

## 2018-11-17 MED ORDER — HEPARIN (PORCINE) 25000 UT/250ML-% IV SOLN: 300.0000 [IU]/h | INTRAVENOUS | Status: DC

## 2018-11-17 MED ORDER — VITAL AF 1.2 CAL PO LIQD
1000.0000 mL | ORAL | Status: DC
  Administered 2018-11-17 – 2018-11-19 (×2): 1000 mL

## 2018-11-17 MED ORDER — CLOPIDOGREL BISULFATE 75 MG PO TABS
75.0000 mg | ORAL_TABLET | Freq: Every day | ORAL | Status: DC
  Administered 2018-11-17 – 2018-11-21 (×5): 75 mg
  Filled 2018-11-17 (×5): qty 1

## 2018-11-17 MED ORDER — LEVOTHYROXINE SODIUM 50 MCG PO TABS
50.0000 ug | ORAL_TABLET | Freq: Every day | ORAL | Status: DC
  Administered 2018-11-18: 50 ug via ORAL
  Filled 2018-11-17: qty 1

## 2018-11-17 MED ORDER — DEXTROSE-NACL 5-0.9 % IV SOLN
INTRAVENOUS | Status: DC
  Administered 2018-11-17 – 2018-11-19 (×3): via INTRAVENOUS

## 2018-11-17 MED ORDER — HEPARIN (PORCINE) 25000 UT/250ML-% IV SOLN
150.0000 [IU]/h | INTRAVENOUS | Status: AC
  Filled 2018-11-17: qty 250

## 2018-11-17 MED ORDER — EVEROLIMUS (ANTINEOPLASTIC) 5 MG TABLET
ORAL_TABLET | Freq: Every day | ORAL | 3 refills | 0.00000 days | Status: CP
Start: 2018-11-17 — End: 2018-12-10

## 2018-11-17 MED FILL — LENVIMA 18 MG/DAY (10 MG X 1 AND 4 MG X 2) CAPSULE: ORAL | 30 days supply | Qty: 90 | Fill #4

## 2018-11-17 MED FILL — LENVIMA 18 MG/DAY (10 MG X 1 AND 4 MG X 2) CAPSULE: 30 days supply | Qty: 90 | Fill #4 | Status: AC

## 2018-11-17 NOTE — Unmapped (Signed)
Sergio Horton 's afinitor shipment will be delayed due to No refills We have contacted the patient and left a message We will call the patient to reschedule the delivery upon resolution. We have confirmed the delivery date as  .

## 2018-11-17 NOTE — Unmapped (Signed)
-----   Message from Estevan Oaks sent at 11/17/2018 12:14 PM EDT -----  Regarding: FW: phone encounter  See message below.    Thanks,  Jaynie Bream  ----- Message -----  From: Kennieth Francois, FNP  Sent: 11/10/2018   4:01 PM EDT  To: Estevan Oaks  Subject: phone encounter                                  Mr. Pauwels needs a phone encounter appt on May 6 and May 20.     Thanks - Thermon Leyland

## 2018-11-17 NOTE — Progress Notes (Addendum)
Progress Note  Patient Name: Stephen Grimes Date of Encounter: 11/17/2018  Primary Cardiologist: New to [facility], Dr. Candee Furbish, MD   Subjective   Obtunded. Withdraw legs. Fixed gaze  Inpatient Medications    Scheduled Meds: . aspirin  81 mg Per Tube Daily  . chlorhexidine gluconate (MEDLINE KIT)  15 mL Mouth Rinse BID  . lactulose  30 g Per Tube BID  . levothyroxine  100 mcg Oral Q0600  . mouth rinse  15 mL Mouth Rinse 10 times per day  . pantoprazole (PROTONIX) IV  40 mg Intravenous QHS  . rifaximin  550 mg Per Tube BID  . sodium chloride flush  3 mL Intravenous Q12H   Continuous Infusions: . sodium chloride    . heparin 500 Units/hr (11/17/18 0800)  . lactated ringers 100 mL/hr at 11/17/18 0800   PRN Meds: sodium chloride, acetaminophen, albuterol, fentaNYL (SUBLIMAZE) injection, labetalol, midazolam, ondansetron (ZOFRAN) IV, sennosides, sodium chloride flush   Vital Signs    Vitals:   11/17/18 0500 11/17/18 0600 11/17/18 0700 11/17/18 0800  BP: 101/77 100/73 106/73 115/79  Pulse:    63  Resp: '16 16 17 13  ' Temp: 99.1 F (37.3 C) 99 F (37.2 C) 98.8 F (37.1 C) 98.8 F (37.1 C)  TempSrc:      SpO2:   100% 100%  Weight:  53.4 kg    Height:        Intake/Output Summary (Last 24 hours) at 11/17/2018 0853 Last data filed at 11/17/2018 0800 Gross per 24 hour  Intake 2738.17 ml  Output 2305 ml  Net 433.17 ml   Filed Weights   11/16/18 1250 11/17/18 0600  Weight: 56.2 kg 53.4 kg   Physical Exam   GEN: Ill appearing on vent, thin  HEENT: ETT Neck: Supple. Cardiac: RRR, no murmurs, rubs, or gallops. No clubbing, cyanosis, edema.  Radials/DP/PT 2+ and equal bilaterally.  Respiratory:  On vent, noise noted GI: Soft, nontender, nondistended, BS + x 4. Scar noted MS: no deformity or atrophy. Skin: warm and dry, no rash. Neuro:  Moved legs earlier. Psych:obtunded.  Labs    Chemistry Recent Labs  Lab 11/16/18 1241 11/16/18 1246  11/16/18 1618  11/16/18 1843 11/17/18 0114  NA 136  --    < > 142 141 142  K 3.4*  --    < > 3.8 4.4 4.0  CL 112*  --   --   --  117* 120*  CO2 9*  --   --   --  11* 8*  GLUCOSE 96  --   --   --  150* 87  BUN 72*  --   --   --  61* 60*  CREATININE 3.46* 3.80*  --   --  2.98* 3.00*  CALCIUM 9.9  --   --   --  8.4* 8.8*  PROT 8.7*  --   --   --   --  7.1  ALBUMIN 4.3  --   --   --   --  3.3*  AST 51*  --   --   --   --  94*  ALT 33  --   --   --   --  30  ALKPHOS 155*  --   --   --   --  121  BILITOT 1.1  --   --   --   --  1.3*  GFRNONAA 17*  --   --   --  20* 20*  GFRAA 20*  --   --   --  24* 23*  ANIONGAP 15  --   --   --  13 14   < > = values in this interval not displayed.     Hematology Recent Labs  Lab 11/16/18 1241  11/16/18 1402 11/16/18 1618 11/17/18 0713  WBC 5.7  --   --   --  7.5  RBC 4.47  --   --   --  4.01*  HGB 13.0   < > 10.5* 12.9* 11.8*  HCT 39.6   < > 31.0* 38.0* 35.1*  MCV 88.6  --   --   --  87.5  MCH 29.1  --   --   --  29.4  MCHC 32.8  --   --   --  33.6  RDW 21.8*  --   --   --  22.2*  PLT 181  --   --   --  125*   < > = values in this interval not displayed.    Cardiac Enzymes Recent Labs  Lab 11/16/18 1713 11/16/18 1843 11/17/18 0103  TROPONINI 8.55* 13.43* 16.76*   No results for input(s): TROPIPOC in the last 168 hours.   BNPNo results for input(s): BNP, PROBNP in the last 168 hours.   DDimer No results for input(s): DDIMER in the last 168 hours.   Radiology    Dg Abdomen 1 View  Result Date: 11/16/2018 CLINICAL DATA:  Repositioning of orogastric tube. EXAM: ABDOMEN - 1 VIEW COMPARISON:  Earlier the same day. FINDINGS: 1340 hours. The orogastric tube has been partially retracted, tip in the gastric fundus. No other significant changes are identified. There is gas throughout the small and large bowel, but no significant distension. Surgical clips are present in the right upper quadrant. IMPRESSION: Orogastric tube tip in the gastric fundus, less  looped in the proximal stomach. Electronically Signed   By: Richardean Sale M.D.   On: 11/16/2018 14:07   Ct Head Wo Contrast  Result Date: 11/16/2018 CLINICAL DATA:  Found unresponsive. EXAM: CT HEAD WITHOUT CONTRAST TECHNIQUE: Contiguous axial images were obtained from the base of the skull through the vertex without intravenous contrast. COMPARISON:  None. FINDINGS: Brain: No evidence of acute infarction, hemorrhage, hydrocephalus, extra-axial collection or mass lesion/mass effect. Vascular: Calcification of the cavernous internal carotid arteries consistent with cerebrovascular atherosclerotic disease. No signs of intracranial large vessel occlusion. Skull: Normal. Negative for fracture or focal lesion. Sinuses/Orbits: No acute finding. Other: None. Electronically Signed   By: Staci Righter M.D.   On: 11/16/2018 13:29   Dg Chest Port 1 View  Result Date: 11/17/2018 CLINICAL DATA:  Respiratory failure. EXAM: PORTABLE CHEST 1 VIEW COMPARISON:  Radiograph of November 16, 2018. FINDINGS: The heart size and mediastinal contours are within normal limits. Endotracheal and nasogastric tubes are unchanged in position. No pneumothorax or pleural effusion is noted. Both lungs are clear. The visualized skeletal structures are unremarkable. IMPRESSION: Stable support apparatus. No acute cardiopulmonary abnormality seen. Electronically Signed   By: Marijo Conception M.D.   On: 11/17/2018 07:58   Dg Chest Portable 1 View  Result Date: 11/16/2018 CLINICAL DATA:  Intubated patient. EXAM: PORTABLE CHEST 1 VIEW COMPARISON:  None. FINDINGS: 1314 hours. Tip of the endotracheal tube is in the mid trachea. An orogastric tube is looped in the proximal stomach. The heart size and mediastinal contours are normal. There is mild aortic atherosclerosis. The lungs are clear. There is  no pleural effusion or pneumothorax. No acute osseous findings are evident. IMPRESSION: Satisfactory position of the endotracheal and orogastric tubes.  No acute cardiopulmonary process identified. Electronically Signed   By: Richardean Sale M.D.   On: 2018/12/11 14:06   Dg Abd Portable 1 View  Result Date: 12-11-2018 CLINICAL DATA:  Orogastric tube placement. EXAM: PORTABLE ABDOMEN - 1 VIEW COMPARISON:  None. FINDINGS: 1317 hours. Orogastric tube is looped in the proximal stomach. There is gas throughout the small and large bowel, but no significant bowel distension. There is no free intraperitoneal air. Surgical clips are present in the right upper quadrant of the abdomen. IMPRESSION: Orogastric tube is looped in the proximal stomach. Electronically Signed   By: Richardean Sale M.D.   On: 12/11/18 14:05   Telemetry    Sinus rhythm 60s, no ventricular tachycardia- Personally Reviewed  ECG    2018/12/11 NSR with ST elevations in inferolateral leads- Personally Reviewed 11/17/2018-normal sinus rhythm 64 with Q waves now in the anterior leads V2 through V4 with subtle ST elevation in V2 and 1 mm elevation in V3 and V4 improved from yesterday, biphasic T wave in V3 and V4.  Cardiac Studies   Bedside echocardiogram 11-Dec-2018: -Severely reduced LV function at 15 to 20% with basilar sparing  Patient Profile     68 y.o. male with a hx of metastatic renal cell Ca, alcoholic cirrhosis who is being seen today for the evaluation of EKG changes at the request of Dr. Alva Garnet.  Assessment & Plan    1.  Abnormal EKG with diffuse ST segment elevation, acute anterior wall myocardial infarction: -Stat echocardiogram performed Dec 11, 2018 with evidence of possible Takotsubo type appearance with marked reduction in LVEF at approximately 15 to 20%.   -EKG with diffuse ST segment elevation, could be compatible with pericarditis or myocardial inflammation, however a large infarct involving the proximal LAD that would wrap around the apex and supply a large portion of the inferior wall, i.e. left main equivalent could also be a possibility.  With his current Q  waves noted in V2 through V4 the latter makes sense.  Per chart review, there was no significant pericardial effusion seen on bedside echocardiogram -Given neurologic condition at present as well as underlying metastatic renal cell cancer, is not currently a candidate for further invasive therapies -Troponin levell to be significantly elevated at 8.55> 13.43> 16.76 > 15.8.  Starting to trend downward -Continue ASA 81, heparin gtt pharmacy -Plan at this time for aggressive supportive care.  I do agree however with palliative care consultation given his overall poor prognosis. -Interestingly, he is not demonstrating any signs of cardiogenic shock.  No pressor support needed.  I will go ahead and provide low-dose metoprolol 12.5 mg twice a day  2.  Acute respiratory failure: -Continues with ETT per PCCM -CXR without acute cardiopulmonary abnormality -Poor mental status for SBT  3.  Chronic kidney disease stage IV: -Creatinine, 3.00 today -Baseline appears to be in the 3.0 range with known metastatic renal cell carcinoma -Avoid nephrotoxic medications  4.  Renal cell carcinoma, metastatic: -See #3  5.  Acute encephalopathy: -Patient with history of hepatic cirrhosis secondary to EtOH -Now with hepatic versus toxic metabolic encephalopathy -Elevated ammonia -EEG, MRI today   Signed, Kathyrn Drown NP-[facility] [place] Pager: [phone] 11/17/2018, 8:53 AM     For questions or updates, please contact   Please consult [email or web address] for contact info under Cardiology/STEMI.  Personally seen and examined. Agree with above.  Acute anterior myocardial  infarction Metabolic acidosis Hypoxic respiratory failure Metastatic renal cell carcinoma Alcoholic cirrhosis Encephalopathy  I personally reviewed and made changes to note above. 68 year old male with renal cell carcinoma and newly discovered ejection fraction in the 15 to 20% range diffusely hypokinetic with basilar sparing, troponin of  approximately 16, severe metabolic acidosis, obtunded but now starting to withdraw legs.  EKG from this morning shows mild ST elevation in V3 and V4 now with biphasic T wave in those leads as well as Q waves V1 through V4.  Small Q waves noted in 2 3 aVF.  Other leads are now isoelectric, no evidence of ST segment elevation in the inferior leads or the lateral leads as was seen yesterday.   Thankfully currently he is not demonstrating any signs of cardiogenic shock.  Blood pressures currently in the 100-110 range.  Heart rate in the 60s.  I will add low-dose metoprolol 12.5 mg twice a day via NG tube.  He is at high risk for further cardiovascular complications.  He did not have any evidence of ventricular tachycardia on telemetry.  Based upon this EKG and troponin peak of 16 currently trending downward, it does appear that he has had an anterior wall infarction.  IV heparin administered and will be continued for a total of 48 hours.  I will go ahead and administer Plavix 75 mg once a day no load via NG tube in addition to his aspirin.  Watch for signs of unanticipated bleeding.  He does have a history of thrombocytopenia but his platelet count is currently 125,000.  In review of chart, no evidence of any significant bleeds.  Neurologic status still quite tenuous.  EEG pending.  Palliative care team has been consulted.  Challenging situation at home with wife suffering from dementia.  Given his history of alcoholic cirrhosis, avoiding statin medication. With his ammonia level increased, question hepatic encephalopathy.  He is currently getting lactulose and rifaximin.   In review of a recent oncologic surgery note from [facility], his renal cell carcinoma has been progressing.  They had changed his chemotherapeutic regimen.  Potentially, he was in a prothrombotic state from his renal cell carcinoma leading to his current situation.  Critical care time 35 minutes spent with patient, extensive data review, lab  review, EKG review, prior portable echocardiogram review.  Candee Furbish, MD

## 2018-11-17 NOTE — Progress Notes (Signed)
Reason for consult: Encephalopathy, coma   Subjective: he remains intubated without sedation.  Is moving to noxious stimulus.   Review of systems is unable to be obtained as patient is intubated.  Examination  Vital signs in last 24 hours: Temp:  [93.4 F (34.1 C)-99.7 F (37.6 C)] 99 F (37.2 C) (04/27 1700) Pulse Rate:  [60-75] 64 (04/27 1600) Resp:  [13-25] 13 (04/27 1700) BP: (61-126)/(51-111) 112/81 (04/27 1700) SpO2:  [99 %-100 %] 100 % (04/27 1700) FiO2 (%):  [40 %-45 %] 40 % (04/27 1505) Weight:  [53.4 kg] 53.4 kg (04/27 0600)  General: lying in bed CVS: pulse-normal rate and rhythm RS: breathing comfortably Extremities: normal   Neuro: Mental Status: Patient does not respond to verbal stimuli.  Does grimace to sternal rub. Cranial Nerves: II: Pulse are equal and reactive III,IV,VI: doll's response present bilaterally.  V,VII: corneal reflex: present b/l VIII: patient does not respond to verbal stimuli IX,X: gag reflex: present  XI: trapezius strength unable to test bilaterally XII: tongue strength unable to test Motor: Localizes in both upper extremities, withdraws both lower extremities bilaterally Sensory: Response to noxious stimulus and grimacing, withdrawal Deep Tendon Reflexes:  Absent throughout. Plantars: Flexor Cerebellar: Unable to perform   Basic Metabolic Panel: Recent Labs  Lab 11/16/18 1241 11/16/18 1246 11/16/18 1301 11/16/18 1402 11/16/18 1618 11/16/18 1843 11/17/18 0114  NA 136  --  139 142 142 141 142  K 3.4*  --  3.5 2.6* 3.8 4.4 4.0  CL 112*  --   --   --   --  117* 120*  CO2 9*  --   --   --   --  11* 8*  GLUCOSE 96  --   --   --   --  150* 87  BUN 72*  --   --   --   --  61* 60*  CREATININE 3.46* 3.80*  --   --   --  2.98* 3.00*  CALCIUM 9.9  --   --   --   --  8.4* 8.8*  MG  --   --   --   --   --   --  1.3*  PHOS  --   --   --   --   --   --  3.9    CBC: Recent Labs  Lab 11/16/18 1241 11/16/18 1301  11/16/18 1402 11/16/18 1618 11/17/18 0713  WBC 5.7  --   --   --  7.5  NEUTROABS 3.5  --   --   --   --   HGB 13.0 13.9 10.5* 12.9* 11.8*  HCT 39.6 41.0 31.0* 38.0* 35.1*  MCV 88.6  --   --   --  87.5  PLT 181  --   --   --  125*     Coagulation Studies: Recent Labs    11/16/18 1241 11/17/18 0114  LABPROT 14.9 17.8*  INR 1.2 1.5*    Imaging Reviewed:     ASSESSMENT AND PLAN  Toxic metabolic encephalopathy  Hepatic coma -ammonia trending down Hypertensive emergency Coma on presentation  Recommendations MRI brain to rule out PRES, assess for metastatic brain disease EEG to r/o nonconvulsive status : Moderate to severe generalized slowing with no epileptiform discharges. Continue to treat hyperammonemia, other metabolic issues.  Too early to prognosticate however has made dramatic improvement in neurological exam from initial assessment-suspect this was due to significantly high elevated levels of ammonia.  MRI brain will  help assess the patient has had underlying anoxic injury.  Also add thiamine for precaution history of cirrhosis.   This patient is neurologically critically ill due to coma on presentation-likely due to hepatic coma, possible anoxic injury/press cannot be ruled out.  He is at risk for significant risk of neurological worsening from cerebral edema, heart failure, infection, respiratory failure and seizure.   This patient's care requires constant monitoring of vital signs, hemodynamics, respiratory and cardiac monitoring,  neurological assessment, obtaining and reviewing EEG, reviewing imaging and discussion with specialists.  I spent 35 minutes of neurocritical time in the care of this patient.      Karena Addison Aroor [facility] Pager Number [phone] For questions after 7pm please refer to AMION to reach the Neurologist on call

## 2018-11-17 NOTE — Progress Notes (Signed)
Assisted tele visit to patient with wife and family member.  Lenox Ahr, RN

## 2018-11-17 NOTE — Progress Notes (Signed)
Pt wife and niece updated over the phone. Video chat was set up with elink. Dr. Halford Chessman was informed that family would like an update from him as well and will be calling to update family.

## 2018-11-17 NOTE — Procedures (Signed)
  [place] A. Merlene Laughter, MD     [email or web address]           HISTORY: The patient is a 68 year old who presents with encephalopathy and altered mental status.  The studies been done to evaluate for seizures as a potential etiology.  MEDICATIONS:  Current Facility-Administered Medications:  .  acetaminophen (TYLENOL) tablet 650 mg, 650 mg, Oral, Q4H PRN, Parrett, Tammy S, NP .  albuterol (PROVENTIL) (2.5 MG/3ML) 0.083% nebulizer solution 2.5 mg, 2.5 mg, Nebulization, Q2H PRN, Parrett, Tammy S, NP .  aspirin chewable tablet 81 mg, 81 mg, Per Tube, Daily, Wilhelmina Mcardle, MD, 81 mg at 11/17/18 0904 .  chlorhexidine gluconate (MEDLINE KIT) (PERIDEX) 0.12 % solution 15 mL, 15 mL, Mouth Rinse, BID, Wilhelmina Mcardle, MD, 15 mL at 11/17/18 0754 .  clopidogrel (PLAVIX) tablet 75 mg, 75 mg, Per Tube, Daily, Jerline Pain, MD, 75 mg at 11/17/18 1202 .  dextrose 5 %-0.9 % sodium chloride infusion, , Intravenous, Continuous, Sood, Vineet, MD, Last Rate: 50 mL/hr at 11/17/18 1600 .  feeding supplement (VITAL AF 1.2 CAL) liquid 1,000 mL, 1,000 mL, Per Tube, Continuous, Sood, Vineet, MD, Last Rate: 55 mL/hr at 11/17/18 1600 .  fentaNYL (SUBLIMAZE) injection 25-100 mcg, 25-100 mcg, Intravenous, Q30 min PRN, Parrett, Tammy S, NP, 50 mcg at 11/16/18 2235 .  heparin ADULT infusion 100 units/mL (25000 units/266m sodium chloride 0.45%), 300 Units/hr, Intravenous, Continuous, Dang, Thuy D, RPH, Last Rate: 3 mL/hr at 11/17/18 1600, 300 Units/hr at 11/17/18 1600 .  labetalol (NORMODYNE) injection 10 mg, 10 mg, Intravenous, Q2H PRN, SWilhelmina Mcardle MD .  lactulose (CHRONULAC) 10 GM/15ML solution 30 g, 30 g, Per Tube, BID, Parrett, Tammy S, NP, 30 g at 11/17/18 0904 .  [START ON 11/18/2018] levothyroxine (SYNTHROID) tablet 50 mcg, 50 mcg, Oral, Q0600, Bowser, Grace E, NP .  MEDLINE mouth rinse, 15 mL, Mouth Rinse, 10 times per day, SWilhelmina Mcardle MD, 15 mL at 11/17/18 1539 .  metoprolol  tartrate (LOPRESSOR) tablet 12.5 mg, 12.5 mg, Oral, BID, SJerline Pain MD, 12.5 mg at 11/17/18 1201 .  midazolam (VERSED) injection 1 mg, 1 mg, Intravenous, Q2H PRN, Parrett, Tammy S, NP, 1 mg at 11/16/18 2235 .  ondansetron (ZOFRAN) injection 4 mg, 4 mg, Intravenous, Q6H PRN, Parrett, Tammy S, NP .  pantoprazole (PROTONIX) injection 40 mg, 40 mg, Intravenous, QHS, Parrett, Tammy S, NP, 40 mg at 11/16/18 2132 .  rifaximin (XIFAXAN) tablet 550 mg, 550 mg, Per Tube, BID, SWilhelmina Mcardle MD, 550 mg at 11/17/18 0904 .  sennosides (SENOKOT) 8.8 MG/5ML syrup 5 mL, 5 mL, Per Tube, BID PRN, Parrett, Tammy S, NP     ANALYSIS: A 16 channel recording using standard 10 20 measurements is conducted for 25 minutes.  The background activity is that of a mostly theta range of 5 to 6 Hz.  There is sometimes lower 3 to 4 Hz activity.  There is a beta activity observed in the frontal areas.  Photic stimulation and hyperventilation are not carried out.  There is no focal or lateralized slowing.  There is no epileptiform activity is observed.   IMPRESSION: 1.  This recording shows moderate global slowing indicating a moderate global encephalopathy.  However, there is no epileptiform activity is observed.      Monik Lins A. DMerlene Laughter M.D.  Diplomate, ATax adviserof Psychiatry and Neurology ( Neurology).

## 2018-11-17 NOTE — Progress Notes (Signed)
  Echocardiogram 2D Echocardiogram has been performed.  Jennette Dubin 11/17/2018, 3:19 PM

## 2018-11-17 NOTE — Progress Notes (Addendum)
Initial Nutrition Assessment RD working remotely.  DOCUMENTATION CODES:   Underweight  INTERVENTION:   Begin TF via OGT:   Vital AF 1.2 at 25 ml/h, increase by 10 ml every 4 hours to goal rate of 55 ml/h (1320 ml per day)  Provides 1584 kcal, 99 gm protein, 1071 ml free water daily  Monitor magnesium, potassium, and phosphorus daily for at least 3 days, MD to replete as needed, as pt is at risk for refeeding syndrome given suspected malnutrition with muscle wasting.  If plans change to comfort care, recommend d/c TF.   NUTRITION DIAGNOSIS:   Inadequate oral intake related to inability to eat as evidenced by NPO status.  GOAL:   Patient will meet greater than or equal to 90% of their needs  MONITOR:   Vent status, TF tolerance, Labs, Skin, I & O's  REASON FOR ASSESSMENT:   Ventilator, Consult Enteral/tube feeding initiation and management  ASSESSMENT:   68 yo male with PMH of metastatic renal cell cancer, alcoholic cirrhosis, and HTN who was admitted with AMS at home. COVID negative. Required intubation in the ED.   Patient is cachectic with wasting in his temples and extremities per MD notes. He is underweight with BMI=18.4. Suspect he is malnourished.   Noted very poor prognosis with potential for transition to comfort care soon. Palliative Care team has been consulted.  Patient is currently intubated on ventilator support. MV: 9.6 L/min Temp (24hrs), Avg:97.8 F (36.6 C), Min:92.5 F (33.6 C), Max:99.7 F (37.6 C)   Labs reviewed. BUN 60 (H), creatinine 3 (H), magnesium 1.3 (L) CBG's: 72-75-64-72 Medications reviewed and include Lactulose, Senokot, mag sulfate. IVF: LR at 100 ml/h.  NUTRITION - FOCUSED PHYSICAL EXAM:  unable to complete  Diet Order:   Diet Order            Diet NPO time specified  Diet effective now              EDUCATION NEEDS:   No education needs have been identified at this time  Skin:  Skin Assessment: Reviewed RN  Assessment  Last BM:  4/27 (type7, rectal tube)  Height:   Ht Readings from Last 1 Encounters:  11/16/18 5\' 7"  (1.702 m)    Weight:   Wt Readings from Last 1 Encounters:  11/17/18 53.4 kg    Ideal Body Weight:  67.3 kg  BMI:  Body mass index is 18.44 kg/m.  Estimated Nutritional Needs:   Kcal:  6160  Protein:  80-100 gm  Fluid:  1.6-1.8 L    Molli Barrows, RD, LDN, [place] Pager [phone] After Hours Pager [phone]

## 2018-11-17 NOTE — Progress Notes (Addendum)
NAME:  Stephen Grimes, MRN:  [id number], DOB:  11/01/1950, LOS: 1 ADMISSION DATE:  11/16/2018, CONSULTATION DATE: 4/26  REFERRING MD:  EDP PA Joy , CHIEF COMPLAINT:  AMS/Resp Failure   Brief History   68 yo male with metastatic renal carcinoma , alcoholic cirrhosis reported altered at home, EMS transported to ER altered with vomiting and elevated b/p . Intubated in ER . CT Head neg for acute . COVID ruled out . Ammonia elevated. EKG with ST elevation   History of present illness   68 yo male with metastatic renal carcinoma , alcoholic cirrhosis altered at home . EMS transported to ER  Limited history concerning presentation. No family in ER .  Marland Kitchen Lives with wife at home that has dementia.   On arrival to ER with worsening mental function , vomiting and elevated b/p . Was intubated in ER for airway protection .  ER workup with CT head neg for acute process. COVID ruled out. Ammonia elevated. Chest xray clear . Afebrile and normal wbc. Bigemny and ectopy increased in ER . Repeat EKG with significant ST elevation . Cardiology consulted. Remained unresponsive off sedation . On exam no reflexes /gag . Neuro consulted. Scr high on arrival at 3.8.  Patient did require Labetolol for frequent ectopy . He was started on Heparin for presumed acute MI . K+ low in ER . K+ replaced.     Past Medical History  Metastatic renal CA  HTN  Hypothyroid   Significant Hospital Events     Consults:  4/26 Neuo  4.26 Cards   Procedures:  4/26 ETT >>   Significant Diagnostic Tests:  4/26 CT Head >no acute  4/26 MRI >>  Micro Data:    Antimicrobials:     Interim history/subjective:  Continues to exhibit poor mental status Electrolytes replaced overnight   Objective   Blood pressure 106/73, pulse 75, temperature 98.8 F (37.1 C), resp. rate 17, height 5\' 7"  (1.702 m), weight 53.4 kg, SpO2 100 %.    Vent Mode: PRVC FiO2 (%):  [45 %-100 %] 45 % Set Rate:  [16 bmp] 16 bmp Vt Set:  [520 mL-5220  mL] 520 mL PEEP:  [5 cmH20] 5 cmH20 Plateau Pressure:  [10 cmH20-12 cmH20] 10 cmH20   Intake/Output Summary (Last 24 hours) at 11/17/2018 0752 Last data filed at 11/17/2018 0600 Gross per 24 hour  Intake 2482.43 ml  Output 1955 ml  Net 527.43 ml   Filed Weights   11/16/18 1250 11/17/18 0600  Weight: 56.2 kg 53.4 kg    Examination: General: elderly, cachectic appearing male, intubated, NAD  HENT: NCAT, ETT secure. Temporal muscle wasting. Trachea midline. Pink mmm  Lungs: CTA bilaterally. Symmetrical chest expansion. No accessory muscle recruitment  Cardiovascular: RRR s1s2 no rgm. 1+ radial pulses, no JVD  Abdomen: Soft, flat, thin, ndnt. Hypoactive x4  Extremities: Symmetrical muscle wasting. No edema. No erythema. No cyanosis or clubbing  Neuro: Pinpoint pupils. Downward, left favoring gaze. Does not follow commands. Grimaces to painful stimuli.  Skin: clean, dry, cool,  without rash   Resolved Hospital Problem list     Assessment & Plan:  Acute Respiratory Failure requiring vent support , etiology unclear with altered mental status- unable to protect airway  COVID-19 negative 4/26 CXR without acute cardiopulmonary abnormality Decreased FiO2 to 40% (4/27). On SBT, Vt sufficient, however mental status incredibly poor  Plan  Continue vent support.  Adjust PEEP/FiO2 for SpO2 >92  WUA/SBT qAM  VAP bundle AM CXR  RASS goal 0 to -1   Acute on Chronic Kidney Disease Baseline Cr 3 Metastatic renal cell carcinoma  Possible component of hypovolemia due to emesis Plan  AM BMP Avoid nephrotoxin  IV hydration -LR at 100/hr   Electrolyte abnormalities Hypokalemia Hypomagnesemia Plan  Replace PRN Trend BMP mag phos K+ goal > 4 to prevent acute arrhythmia   Acute encephalopathy -etiology hepatic vs toxic metabolic?  -vomiting and right sided gaze on admit ? Seizure  EtOH abuse with Cirrhosis  CT head 4/26 neg for acute , ammonia elevated  P Neurology following  appreciate recs. Does not appear to be in status clinically EEG 4/27 MRI ordered  May have impaired metabolism/excretion of sedating medications due to underlying kidney/liver dysfunction  Minimize sedation, PRN fent and versed UDS pending Lactulose BID Trend ammonia LFTs PRN   HTN Ventricular tachycardia, intermittent  Abnormal EKG with diffuse ST segment elevation  Acute heart failure with reduced EF (estimated 15-20%)  Troponin 16.76 Pericarditis /myocardial inflammation vs L main occlusion Home meds: norvasc, losartan  Plan  Cardiology consulted appreciate recs Cardiology bedside echo EF 15-20% (most recent formal ECHO 2018 EF 65%) suggestive of takotsubo  Per cardiology, given severity of illness and high risk of mortality, not a candidate for invasive procedure If ALT remains normal, consider statin therapy  IV Heparin, pharmacy dosing ECG PRN  K+ goal >4  ECHO  Anemia  Plan  Trend CBC  Transfuse per unit protocol  Hypothyroidism  On levothyroxine at home  TSH 128 Plan  Suspect non-compliant with home synthroid (home dose 112 mcg)  Starting synthroid at 24mcg, if non-compliant at home do not want to over-dose synthroid. Increase as needed.  Goals of Care: At this time, patient is full code Of note, wife has dementia  Patient with underlying metastatic renal carcinoma, baseline EtOH abuse with cirrhosis  Acute kidney injury on chronic renal injury Acute heart failure, takotsubo-like on bedside echo, EF 15-20%, and diffuse ST segment elevation with elevated troponins. Patient is not a candidate for cardiac intervention per cardiology due to high risk of mortality. If L main occlusion, per cardiology likely not compatible with life.  Acute encephalopathy Due to severity of acute illness and chronic illness, patient prognosis likely poor. Will consult palliative care medicine  Best practice:  Diet: NPO  Pain/Anxiety/Delirium protocol (if indicated): PRN  vent/versed  VAP protocol (if indicated): in place  DVT prophylaxis: heparin gtt   GI prophylaxis: Protonix  Glucose control: monitor  Mobility: BR  Code Status: Full  Family Communication: pending Disposition: ICU   Labs   CBC: Recent Labs  Lab 11/16/18 1241 11/16/18 1301 11/16/18 1402 11/16/18 1618  WBC 5.7  --   --   --   NEUTROABS 3.5  --   --   --   HGB 13.0 13.9 10.5* 12.9*  HCT 39.6 41.0 31.0* 38.0*  MCV 88.6  --   --   --   PLT 181  --   --   --     Basic Metabolic Panel: Recent Labs  Lab 11/16/18 1241 11/16/18 1246 11/16/18 1301 11/16/18 1402 11/16/18 1618 11/16/18 1843 11/17/18 0114  NA 136  --  139 142 142 141 142  K 3.4*  --  3.5 2.6* 3.8 4.4 4.0  CL 112*  --   --   --   --  117* 120*  CO2 9*  --   --   --   --  11* 8*  GLUCOSE 96  --   --   --   --  150* 87  BUN 72*  --   --   --   --  61* 60*  CREATININE 3.46* 3.80*  --   --   --  2.98* 3.00*  CALCIUM 9.9  --   --   --   --  8.4* 8.8*  MG  --   --   --   --   --   --  1.3*  PHOS  --   --   --   --   --   --  3.9   GFR: Estimated Creatinine Clearance: 17.6 mL/min (A) (by C-G formula based on SCr of 3 mg/dL (H)). Recent Labs  Lab 11/16/18 1241 11/16/18 1713  PROCALCITON  --  <0.10  WBC 5.7  --   LATICACIDVEN  --  3.7*    Liver Function Tests: Recent Labs  Lab 11/16/18 1241 11/17/18 0114  AST 51* 94*  ALT 33 30  ALKPHOS 155* 121  BILITOT 1.1 1.3*  PROT 8.7* 7.1  ALBUMIN 4.3 3.3*   No results for input(s): LIPASE, AMYLASE in the last 168 hours. Recent Labs  Lab 11/16/18 1241  AMMONIA 133*    ABG    Component Value Date/Time   PHART 7.268 (L) 11/16/2018 1618   PCO2ART 17.0 (LL) 11/16/2018 1618   PO2ART 141.0 (H) 11/16/2018 1618   HCO3 8.1 (L) 11/16/2018 1618   TCO2 9 (L) 11/16/2018 1618   ACIDBASEDEF 17.0 (H) 11/16/2018 1618   O2SAT 99.0 11/16/2018 1618     Coagulation Profile: Recent Labs  Lab 11/16/18 1241 11/17/18 0114  INR 1.2 1.5*    Cardiac Enzymes:  Recent Labs  Lab 11/16/18 1713 11/16/18 1843 11/17/18 0103  TROPONINI 8.55* 13.43* 16.76*    HbA1C: No results found for: HGBA1C  CBG: Recent Labs  Lab 11/16/18 1227 11/16/18 1654 11/16/18 1928 11/17/18 0039 11/17/18 0425  GLUCAP 81 146* 117* 85 72    Critical care time: 50 min     Eliseo Gum MSN, AGACNP-BC [place] [phone] If no answer, [phone] 11/17/2018, 7:54 AM

## 2018-11-17 NOTE — Progress Notes (Signed)
eLink Physician-Brief Progress Note Patient Name: Stephen Grimes DOB: [birth year] MRN: [id number]   Date of Service  11/17/2018  HPI/Events of Note  Mg++ = 1.3 and Creatinine = 3.0.  eICU Interventions  Will replace Mg++.     Intervention Category Major Interventions: Electrolyte abnormality - evaluation and management  Kirston Luty Eugene 11/17/2018, 3:30 AM

## 2018-11-17 NOTE — Progress Notes (Signed)
[place] for Heparin Indication: chest pain/ACS  Heparin Dosing Weight: 56.2 kg  Labs: Recent Labs    11/16/18 1241 11/16/18 1246 11/16/18 1301 11/16/18 1402 11/16/18 1618 11/16/18 1713 11/16/18 1843 11/17/18 0103 11/17/18 0114  HGB 13.0  --  13.9 10.5* 12.9*  --   --   --   --   HCT 39.6  --  41.0 31.0* 38.0*  --   --   --   --   PLT 181  --   --   --   --   --   --   --   --   APTT 30  --   --   --   --   --   --   --   --   LABPROT 14.9  --   --   --   --   --   --   --  17.8*  INR 1.2  --   --   --   --   --   --   --  1.5*  HEPARINUNFRC  --   --   --   --   --   --   --  1.84*  --   CREATININE 3.46* 3.80*  --   --   --   --  2.98*  --  3.00*  TROPONINI  --   --   --   --   --  8.55* 13.43* 16.76*  --     Assessment: 21 yom presenting with AMS, vomiting, elevated BP. Pt intubated in ER, CT head neg for bleed. Pharmacy consulted to dose heparin for ACS. No anticoagulation PTA documented in fax sent from patient's pharmacy. PTA. Hg 10.5, plt wnl. No active bleed issues documented.  11/17/2018 am update: heparin level is elevated, confirmed with RN that lab was drawn from opposite arm of heparin infusion   Goal of Therapy:  Heparin level 0.3-0.7 units/ml Monitor platelets by anticoagulation protocol: Yes   Plan:  Hold heparin x 1 hr Re-start heparin at 500 units/hr at 0415 Re-check heparin level in 8 hours  Narda Bonds, PharmD, [place] Pharmacist Phone: [phone]

## 2018-11-17 NOTE — Progress Notes (Signed)
EEG complete - results pending 

## 2018-11-17 NOTE — Progress Notes (Signed)
ANTICOAGULATION CONSULT NOTE  Pharmacy Consult for Heparin Indication: chest pain/ACS  Heparin Dosing Weight: 56.2 kg  Labs: Recent Labs    11/16/18 1241 11/16/18 1246  11/16/18 1402 11/16/18 1618  11/16/18 1843 11/17/18 0103 11/17/18 0114 11/17/18 0713 11/17/18 0937 11/17/18 1155 11/17/18 2135  HGB 13.0  --    < > 10.5* 12.9*  --   --   --   --  11.8*  --   --   --   HCT 39.6  --    < > 31.0* 38.0*  --   --   --   --  35.1*  --   --   --   PLT 181  --   --   --   --   --   --   --   --  125*  --   --   --   APTT 30  --   --   --   --   --   --   --   --   --   --   --   --   LABPROT 14.9  --   --   --   --   --   --   --  17.8*  --   --   --   --   INR 1.2  --   --   --   --   --   --   --  1.5*  --   --   --   --   HEPARINUNFRC  --   --   --   --   --   --   --  1.84*  --   --   --  1.92* 1.10*  CREATININE 3.46* 3.80*  --   --   --   --  2.98*  --  3.00*  --   --   --   --   TROPONINI  --   --   --   --   --    < > 13.43* 16.76*  --   --  15.79*  --   --    < > = values in this interval not displayed.    Assessment: 30 yom presenting with AMS, vomiting, elevated BP. Pt intubated in ER, CT head neg for bleed. Pharmacy consulted to dose heparin for ACS. No anticoagulation PTA documented in fax sent from patient's pharmacy. PTA. Hg 10.5, plt wnl. No active bleed issues documented.  11/17/2018 PM update: heparin level remains elevated, confirmed with RN that lab was drawn from opposite arm of heparin infusion  Goal of Therapy:  Heparin level 0.3-0.7 units/ml Monitor platelets by anticoagulation protocol: Yes   Plan:  -Hold heparin x 1.5 hrs -Re-start heparin at 150 units/hr at 0100  -Re-check heparin level in 8 hours -Cardiology planning for 48 hours heparin -If anti-coagulation needed past 48 hours, would consider alternative anti-coagulant given unusually high heparin levels on a very small dose  Narda Bonds, PharmD, BCPS Clinical Pharmacist Phone: [phone]

## 2018-11-17 NOTE — Progress Notes (Signed)
ANTICOAGULATION CONSULT NOTE  Pharmacy Consult:  Heparin Indication: chest pain/ACS  No Known Allergies  Patient Measurements: Height: 5\' 7"  (170.2 cm) Weight: 117 lb 11.6 oz (53.4 kg) IBW/kg (Calculated) : 66.1 Heparin Dosing Weight: 56 kg  Vital Signs: Temp: 98.6 F (37 C) (04/27 1200) BP: 112/74 (04/27 1201) Pulse Rate: 66 (04/27 1201)  Labs: Recent Labs    11/16/18 1241 11/16/18 1246  11/16/18 1402 11/16/18 1618  11/16/18 1843 11/17/18 0103 11/17/18 0114 11/17/18 0713 11/17/18 0937 11/17/18 1155  HGB 13.0  --    < > 10.5* 12.9*  --   --   --   --  11.8*  --   --   HCT 39.6  --    < > 31.0* 38.0*  --   --   --   --  35.1*  --   --   PLT 181  --   --   --   --   --   --   --   --  125*  --   --   APTT 30  --   --   --   --   --   --   --   --   --   --   --   LABPROT 14.9  --   --   --   --   --   --   --  17.8*  --   --   --   INR 1.2  --   --   --   --   --   --   --  1.5*  --   --   --   HEPARINUNFRC  --   --   --   --   --   --   --  1.84*  --   --   --  1.92*  CREATININE 3.46* 3.80*  --   --   --   --  2.98*  --  3.00*  --   --   --   TROPONINI  --   --   --   --   --    < > 13.43* 16.76*  --   --  15.79*  --    < > = values in this interval not displayed.    Estimated Creatinine Clearance: 17.6 mL/min (A) (by C-G formula based on SCr of 3 mg/dL (H)).   Assessment: 81 YOM presented with AMS, vomiting and hypertension.  CT head negative for bleeding and Pharmacy consulted to dose IV heparin for ACS.  Heparin level is supra-therapeutic despite holding and reducing heparin rate.  Confirmed that heparin level was drawn appropriately.  No bleeding reported.  Patient has a history of cirrhosis and AST is mildly elevated.  He has renal cell carcinoma that has been progressing.  Goal of Therapy:  Heparin level 0.3-0.7 units/ml Monitor platelets by anticoagulation protocol: Yes   Plan:  Hold heparin for 2 hrs  At 1400, resume heparin gtt at 300 units/hr Check  8 hr heparin level Daily heparin level and CBC  Autumnrose Yore D. Mina Marble, PharmD, BCPS, [place] 11/17/2018, 1:09 PM

## 2018-11-17 NOTE — Plan of Care (Signed)
  Problem: Fluid Volume: Goal: Hemodynamic stability will improve Outcome: Progressing   Problem: Clinical Measurements: Goal: Diagnostic test results will improve Outcome: Progressing   Problem: Respiratory: Goal: Ability to maintain adequate ventilation will improve Outcome: Progressing   Problem: Respiratory: Goal: Ability to maintain a clear airway and adequate ventilation will improve Outcome: Progressing

## 2018-11-18 ENCOUNTER — Inpatient Hospital Stay (HOSPITAL_COMMUNITY): Payer: Medicare Other

## 2018-11-18 DIAGNOSIS — N179 Acute kidney failure, unspecified: Secondary | ICD-10-CM | POA: Diagnosis present

## 2018-11-18 DIAGNOSIS — K7200 Acute and subacute hepatic failure without coma: Secondary | ICD-10-CM

## 2018-11-18 DIAGNOSIS — G9340 Encephalopathy, unspecified: Secondary | ICD-10-CM | POA: Diagnosis present

## 2018-11-18 DIAGNOSIS — C649 Malignant neoplasm of unspecified kidney, except renal pelvis: Secondary | ICD-10-CM | POA: Diagnosis present

## 2018-11-18 DIAGNOSIS — I509 Heart failure, unspecified: Secondary | ICD-10-CM

## 2018-11-18 DIAGNOSIS — Z515 Encounter for palliative care: Secondary | ICD-10-CM

## 2018-11-18 DIAGNOSIS — Z7189 Other specified counseling: Secondary | ICD-10-CM

## 2018-11-18 LAB — CBC
HCT: 32.4 % — ABNORMAL LOW (ref 39.0–52.0)
Hemoglobin: 10.5 g/dL — ABNORMAL LOW (ref 13.0–17.0)
MCH: 29.1 pg (ref 26.0–34.0)
MCHC: 32.4 g/dL (ref 30.0–36.0)
MCV: 89.8 fL (ref 80.0–100.0)
Platelets: 136 10*3/uL — ABNORMAL LOW (ref 150–400)
RBC: 3.61 MIL/uL — ABNORMAL LOW (ref 4.22–5.81)
RDW: 22.9 % — ABNORMAL HIGH (ref 11.5–15.5)
WBC: 6.4 10*3/uL (ref 4.0–10.5)
nRBC: 0 % (ref 0.0–0.2)

## 2018-11-18 LAB — APTT: aPTT: 74 seconds — ABNORMAL HIGH (ref 24–36)

## 2018-11-18 LAB — POCT I-STAT 7, (LYTES, BLD GAS, ICA,H+H)
Acid-base deficit: 13 mmol/L — ABNORMAL HIGH (ref 0.0–2.0)
Bicarbonate: 10.4 mmol/L — ABNORMAL LOW (ref 20.0–28.0)
Calcium, Ion: 1.31 mmol/L (ref 1.15–1.40)
HCT: 27 % — ABNORMAL LOW (ref 39.0–52.0)
Hemoglobin: 9.2 g/dL — ABNORMAL LOW (ref 13.0–17.0)
O2 Saturation: 99 %
Patient temperature: 98.6
Potassium: 3.5 mmol/L (ref 3.5–5.1)
Sodium: 147 mmol/L — ABNORMAL HIGH (ref 135–145)
TCO2: 11 mmol/L — ABNORMAL LOW (ref 22–32)
pCO2 arterial: 18.8 mmHg — CL (ref 32.0–48.0)
pH, Arterial: 7.353 (ref 7.350–7.450)
pO2, Arterial: 151 mmHg — ABNORMAL HIGH (ref 83.0–108.0)

## 2018-11-18 LAB — GLUCOSE, CAPILLARY
Glucose-Capillary: 106 mg/dL — ABNORMAL HIGH (ref 70–99)
Glucose-Capillary: 110 mg/dL — ABNORMAL HIGH (ref 70–99)
Glucose-Capillary: 122 mg/dL — ABNORMAL HIGH (ref 70–99)
Glucose-Capillary: 125 mg/dL — ABNORMAL HIGH (ref 70–99)
Glucose-Capillary: 131 mg/dL — ABNORMAL HIGH (ref 70–99)
Glucose-Capillary: 133 mg/dL — ABNORMAL HIGH (ref 70–99)
Glucose-Capillary: 92 mg/dL (ref 70–99)

## 2018-11-18 LAB — MAGNESIUM: Magnesium: 2.1 mg/dL (ref 1.7–2.4)

## 2018-11-18 LAB — URINE DRUGS OF ABUSE SCREEN W ALC, ROUTINE (REF LAB)
Amphetamines, Urine: NEGATIVE ng/mL
Barbiturate, Ur: NEGATIVE ng/mL
Benzodiazepine Quant, Ur: NEGATIVE ng/mL
Cannabinoid Quant, Ur: NEGATIVE ng/mL
Cocaine (Metab.): NEGATIVE ng/mL
Ethanol U, Quan: NEGATIVE %
Methadone Screen, Urine: NEGATIVE ng/mL
Opiate Quant, Ur: NEGATIVE ng/mL
Phencyclidine, Ur: NEGATIVE ng/mL
Propoxyphene, Urine: NEGATIVE ng/mL

## 2018-11-18 LAB — PHOSPHORUS: Phosphorus: 2.7 mg/dL (ref 2.5–4.6)

## 2018-11-18 LAB — BLOOD GAS, ARTERIAL
Acid-base deficit: 15.2 mmol/L — ABNORMAL HIGH (ref 0.0–2.0)
Bicarbonate: 9.2 mmol/L — ABNORMAL LOW (ref 20.0–28.0)
Drawn by: 308601
FIO2: 40
MECHVT: 520 mL
O2 Saturation: 99.1 %
PEEP: 5 cmH2O
Patient temperature: 98.6
RATE: 16 resp/min
pH, Arterial: 7.381 (ref 7.350–7.450)
pO2, Arterial: 178 mmHg — ABNORMAL HIGH (ref 83.0–108.0)

## 2018-11-18 LAB — HEPARIN LEVEL (UNFRACTIONATED): Heparin Unfractionated: 0.38 IU/mL (ref 0.30–0.70)

## 2018-11-18 LAB — AMMONIA: Ammonia: 71 umol/L — ABNORMAL HIGH (ref 9–35)

## 2018-11-18 LAB — COMPREHENSIVE METABOLIC PANEL
ALT: 28 U/L (ref 0–44)
AST: 78 U/L — ABNORMAL HIGH (ref 15–41)
Albumin: 2.7 g/dL — ABNORMAL LOW (ref 3.5–5.0)
Alkaline Phosphatase: 118 U/L (ref 38–126)
Anion gap: 11 (ref 5–15)
BUN: 49 mg/dL — ABNORMAL HIGH (ref 8–23)
CO2: 10 mmol/L — ABNORMAL LOW (ref 22–32)
Calcium: 8.9 mg/dL (ref 8.9–10.3)
Chloride: 123 mmol/L — ABNORMAL HIGH (ref 98–111)
Creatinine, Ser: 2.28 mg/dL — ABNORMAL HIGH (ref 0.61–1.24)
GFR calc Af Amer: 33 mL/min — ABNORMAL LOW (ref >60)
GFR calc non Af Amer: 28 mL/min — ABNORMAL LOW (ref >60)
Glucose, Bld: 160 mg/dL — ABNORMAL HIGH (ref 70–99)
Potassium: 3.5 mmol/L (ref 3.5–5.1)
Sodium: 144 mmol/L (ref 135–145)
Total Bilirubin: 0.9 mg/dL (ref 0.3–1.2)
Total Protein: 6.2 g/dL — ABNORMAL LOW (ref 6.5–8.1)

## 2018-11-18 LAB — MRSA PCR SCREENING: MRSA by PCR: NEGATIVE

## 2018-11-18 MED ORDER — STERILE WATER FOR INJECTION IV SOLN
INTRAVENOUS | Status: DC
  Administered 2018-11-18 – 2018-11-20 (×4): via INTRAVENOUS
  Filled 2018-11-18 (×6): qty 850

## 2018-11-18 MED ORDER — POTASSIUM CHLORIDE 20 MEQ/15ML (10%) PO SOLN
40.0000 meq | Freq: Once | ORAL | Status: AC
  Administered 2018-11-18: 40 meq
  Filled 2018-11-18: qty 30

## 2018-11-18 MED ORDER — LEVOTHYROXINE SODIUM 50 MCG PO TABS
50.0000 ug | ORAL_TABLET | Freq: Every day | ORAL | Status: DC
  Administered 2018-11-19 – 2018-11-21 (×3): 50 ug
  Filled 2018-11-18 (×3): qty 1

## 2018-11-18 NOTE — Progress Notes (Signed)
ABG drawn. Critical value PCO2-18.8 given to Eliseo Gum NP CCM at [phone] by me.  Kathie Dike RRT

## 2018-11-18 NOTE — TOC Initial Note (Signed)
Transition of Care [facility]) - Initial/Assessment Note    Patient Details  Name: Stephen Grimes MRN: [id number] Date of Birth: Oct 18, 1950  Transition of Care [facility]) CM/SW Contact:    Bartholomew Crews, RN Phone Number: [phone] 11/18/2018, 5:21 PM  Clinical Narrative:                 Spoke with wife, Shirlene, when she Elinked into room with extended family. Discussed CM role to assist with discharge planning. Patient remains intubated and on vent, but responded to the voices of his family. CM to follow for transition of care needs.   Expected Discharge Plan: [place] Barriers to Discharge: Continued Medical Work up   Patient Goals and CMS Choice        Expected Discharge Plan and Services Expected Discharge Plan: [place] In-house Referral: Clinical Social Work Discharge Planning Services: CM Consult   Living arrangements for the past 2 months: [place]                                      Prior Living Arrangements/Services Living arrangements for the past 2 months: Single Family Home Lives with:: Spouse, Self Patient language and need for interpreter reviewed:: Yes        Need for Family Participation in Patient Care: Yes (Comment) Care giver support system in place?: Yes (comment)   Criminal Activity/Legal Involvement Pertinent to Current Situation/Hospitalization: No - Comment as needed  Activities of Daily Living      Permission Sought/Granted                  Emotional Assessment              Admission diagnosis:  Encephalopathy [G93.40] AKI (acute kidney injury) ([place]) [N17.9] Patient Active Problem List   Diagnosis Date Noted  . Palliative care by specialist   . Goals of care, counseling/discussion   . Encephalopathy   . AKI (acute kidney injury) ([place])   . Acute congestive heart failure ([place])   . Metastatic renal cell carcinoma ([place])   . Acute respiratory failure ([place]) 11/16/2018    PCP:  Seward Carol, MD Pharmacy:   [facility] [address] [place] - [place] [address] [place] [address] Phone: [phone] Fax: [phone]     Social Determinants of Health ([place]) Interventions    Readmission Risk Interventions No flowsheet data found.

## 2018-11-18 NOTE — Consult Note (Signed)
Consultation Note Date: 11/18/2018   Patient Name: Stephen Grimes  DOB: Nov 10, 1950  MRN: [id number]  Age / Sex: 68 y.o., male  PCP: Seward Carol, MD Referring Physician: Laurin Coder, MD  Reason for Consultation: Establishing goals of care  HPI/Patient Profile: 68 y.o. male  with past medical history of metastatic renal carcinoma, HTN, ETOH cirrhosis, thyroid disease and renal impairment  admitted on 11/16/2018 with nausea/vomiting and altered mental status. ICU admission with acute respiratory failure requiring mechanical ventilation, acute on CKD with baseline metastatic renal cell carcinoma, acute systolic heart failure LVEF 20%, abnormal EKG with diffuse ST segment elevation, and acute encephalopathy with elevated ammonia. Neurology and cardiology following. Patient is not a candidate for invasive cardiac interventions. CT head negative. EEG 4/27 consistent with moderate encephalopathy, no epileptiform activity. Poor prognosis with acute critical condition and chronic illness including metastatic cancer. Palliative medicine consultation for goals of care.   Clinical Assessment and Goals of Care:  I have reviewed medical records, discussed with RN and Eliseo Gum, PCCM NP, and assessed the patient at bedside. Tymir opens eyes to voice. He does not follow commands. He is currently weaning on ventilator. No family at bedside due to Covid-19 visitor restriction.   Spoke with patient's wife Conservation officer, nature), patient's sister-in-law Danton Clap), and a niece ([place]).    Introduced Palliative Medicine as specialized medical care for people living with serious illness. It focuses on providing relief from the symptoms and stress of a serious illness. The goal is to improve quality of life for both the patient and the family.  We discussed a brief life review of the patient. 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   Discussed events leading up to admission and course of hospital diagnoses and interventions. Extensively discussed lab and test results and medications. Family acknowledges conversation with critical care MD earlier and that it "wasn't looking very good" and acknowledging heart working at "20%." Explained that he is not a candidate for invasive cardiac procedures.  I attempted to elicit values and goals of care important to the patient and family. Advanced directives, concepts specific to code status, artifical feeding and hydration were discussed. Danton Clap shares the conversation with critical care doctor about resuscitation. Further explained code blue scenario and poor outcomes of CPR or meaningful recovery with his critical condition, frailty, and underlying cancer and chronic conditions. Explained that he is high risk for cardiac arrest and death with poor prognosis. Educated on medical recommendation for DNR. Reassured wife and family that everything is being done at this point.   Family is unsure if he has an advance directive. They plan to search for paperwork. Wife is firm on decision for FULL code/FULL scope treatment. She shares that she knows her husband, and she knows he would want "everything" to be done including resuscitation to be attempted. Niece further explains they  plan to look for his directive and plan to stay involved to "help her make decisions."   Answered questions and concerns. Wife agreeable with ongoing palliative support. PMT contact information given.   **Updated Shirlee Limerick, PCCM NP who spoke with [facility] oncology NP, Lorre Munroe. PMT provider will reach out to oncology NP. May be helpful for Lorre Munroe to be involved with discussions with family.    SUMMARY OF RECOMMENDATIONS    Wife demands continued FULL  code/FULL scope treatment. Not considering DNR code status at this time.  Will need ongoing palliative discussions. Other family members present during initial [place] with wife via telephone and plan to stay involved and help her make decisions. PMT will follow.  Code Status/Advance Care Planning:  Full code  Symptom Management:   Per attending  Palliative Prophylaxis:   Aspiration, Delirium Protocol, Frequent Pain Assessment, Oral Care and Turn Reposition  Additional Recommendations (Limitations, Scope, Preferences):  Full Scope Treatment  Psycho-social/Spiritual:   Desire for further Chaplaincy support:yes  Additional Recommendations: Caregiving  Support/Resources, Compassionate Wean Education and Education on Hospice  Prognosis:   Poor prognosis  Discharge Planning: To Be Determined      Primary Diagnoses: Present on Admission: . Acute respiratory failure ([place])   I have reviewed the medical record, interviewed the patient and family, and examined the patient. The following aspects are pertinent.  Past Medical History:  Diagnosis Date  . Alcoholic cirrhosis ([place])   . Cirrhosis ([place])   . HTN (hypertension)   . Metastatic renal cell carcinoma ([place])   . Renal cancer ([place])   . Renal impairment   . Thyroid disease    Social History   Socioeconomic History  . Marital status: Married    Spouse name: Not on file  . Number of children: Not on file  . Years of education: Not on file  . Highest education level: Not on file  Occupational History  . Not on file  Social Needs  . Financial resource strain: Not on file  . Food insecurity:    Worry: Not on file    Inability: Not on file  . Transportation needs:    Medical: Not on file    Non-medical: Not on file  Tobacco Use  . Smoking status: Not on file  Substance and Sexual Activity  . Alcohol use: Not on file  . Drug use: Not on file  . Sexual activity: Not on file  Lifestyle  . Physical activity:     Days per week: Not on file    Minutes per session: Not on file  . Stress: Not on file  Relationships  . Social connections:    Talks on phone: Not on file    Gets together: Not on file    Attends religious service: Not on file    Active member of club or organization: Not on file    Attends meetings of clubs or organizations: Not on file    Relationship status: Not on file  Other Topics Concern  . Not on file  Social History Narrative  . Not on file   History reviewed. No pertinent family history. Scheduled Meds: . aspirin  81 mg Per Tube Daily  . chlorhexidine gluconate (MEDLINE KIT)  15 mL Mouth Rinse BID  . clopidogrel  75 mg Per Tube Daily  . lactulose  30 g Per Tube BID  . [START ON 11/19/2018] levothyroxine  50 mcg Per Tube Q0600  . mouth rinse  15 mL Mouth Rinse 10  times per day  . metoprolol tartrate  12.5 mg Oral BID  . pantoprazole (PROTONIX) IV  40 mg Intravenous QHS  . rifaximin  550 mg Per Tube BID  . thiamine injection  100 mg Intravenous Daily   Continuous Infusions: . dextrose 5 % and 0.9% NaCl 50 mL/hr at 11/18/18 1500  . feeding supplement (VITAL AF 1.2 CAL) 55 mL/hr at 11/17/18 1800  . heparin 150 Units/hr (11/18/18 1500)  .  sodium bicarbonate (isotonic) infusion in sterile water 75 mL/hr at 11/18/18 1500   PRN Meds:.acetaminophen, albuterol, fentaNYL (SUBLIMAZE) injection, labetalol, midazolam, ondansetron (ZOFRAN) IV, sennosides Medications Prior to Admission:  Prior to Admission medications   Medication Sig Start Date End Date Taking? Authorizing Provider  amLODipine (NORVASC) 2.5 MG tablet Take 2.5 mg by mouth daily.  08/23/18  Yes [provider]  ANTI-DIARRHEAL 2 MG tablet Take 1 tablet by mouth daily. 11/05/18  Yes [provider]  diphenoxylate-atropine (LOMOTIL) 2.5-0.025 MG tablet Take 1 tablet by mouth 2 (two) times daily as needed for diarrhea or loose stools. 11/13/18  Yes [provider]  levothyroxine (SYNTHROID) 112  MCG tablet Take 112 mcg by mouth every morning. 09/25/18  Yes [provider]  losartan (COZAAR) 25 MG tablet Take 25 mg by mouth daily. 11/10/18  Yes [provider]  multivitamin (RENA-VIT) TABS tablet Take 1 tablet by mouth daily. 11/11/18  Yes [provider]  omeprazole (PRILOSEC) 20 MG capsule Take 20 mg by mouth daily. 10/16/18  Yes [provider]  ondansetron (ZOFRAN-ODT) 8 MG disintegrating tablet Take 8 mg by mouth 2 (two) times daily as needed for nausea/vomiting. 11/05/18  Yes [provider]  urea (CARMOL) 40 % CREA Apply 1 application topically See admin instructions. Apply to sore areas on hands and feet 11/05/18  Yes [provider]   No Known Allergies Review of Systems  Unable to perform ROS: Acuity of condition   Physical Exam Vitals signs and nursing note reviewed.  Constitutional:      Appearance: He is ill-appearing.     Interventions: He is intubated.  HENT:     Head: Normocephalic and atraumatic.  Cardiovascular:     Rate and Rhythm: Normal rate.  Pulmonary:     Effort: No tachypnea, accessory muscle usage or respiratory distress. He is intubated.     Breath sounds: Normal breath sounds.  Abdominal:     Tenderness: There is no abdominal tenderness.  Skin:    General: Skin is warm and dry.  Neurological:     Mental Status: He is easily aroused.     Comments: Opens eyes to voice, not following commands    Vital Signs: BP 120/72   Pulse 74   Temp 99.7 F (37.6 C)   Resp 18   Ht '5\' 7"'  (1.702 m)   Wt 51.2 kg   SpO2 100%   BMI 17.68 kg/m  Pain Scale: CPOT       SpO2: SpO2: 100 % O2 Device:SpO2: 100 % O2 Flow Rate: .   IO: Intake/output summary:   Intake/Output Summary (Last 24 hours) at 11/18/2018 1532 Last data filed at 11/18/2018 1500 Gross per 24 hour  Intake 3394.81 ml  Output 1352.5 ml  Net 2042.31 ml    LBM: Last BM Date: 11/17/18 Baseline Weight: Weight: 56.2 kg Most recent weight:  Weight: 51.2 kg     Palliative Assessment/Data: PPS 30%   Flowsheet Rows     Most Recent Value  Intake Tab  Referral Department  Critical care  Unit at Time of Referral  ICU  Palliative Care Primary Diagnosis  -- [metastatic renal carcinoma, acute CHF, MI, respiratory failure on vent]  Palliative Care Type  New Palliative care  Date first seen by Palliative Care  11/18/18  Clinical Assessment  Palliative Performance Scale Score  30%  Psychosocial & Spiritual Assessment  Palliative Care Outcomes  Patient/Family meeting held?  Yes  Who was at the meeting?  wife, sister-in-law, niece  Palliative Care Outcomes  Clarified goals of care, Provided end of life care assistance, Provided psychosocial or spiritual support, ACP counseling assistance      Time In/Out: [phone], [phone] Time Total: 37mn Greater than 50%  of this time was spent counseling and coordinating care related to the above assessment and plan.  Signed by:  MIhor Dow FNP-C Palliative Medicine Team  Phone: [phone]: [phone]  Please contact Palliative Medicine Team phone at [phone] questions and concerns.  For individual provider: See AShea Evans

## 2018-11-18 NOTE — Progress Notes (Signed)
eLink Physician-Brief Progress Note Patient Name: Stephen Grimes DOB: 07-23-1951 MRN: [id number]   Date of Service  11/18/2018  HPI/Events of Note  ABG on 40$/PRVC 16/TV 520/P 5 = 7.38/< 19.0/178/9.0. Anion Gap = 14. Etiology of non gap acidosis? Diarrhea and loss of HCO3--- or RTA?  eICU Interventions  Will order: 1. NaHCO3 IV infusion to run IV at 75 mL/hour.  2. Repeat ABG at 8 AM.     Intervention Category Major Interventions: Acid-Base disturbance - evaluation and management;Respiratory failure - evaluation and management  Gracelyn Coventry [place] 11/18/2018, 4:09 AM

## 2018-11-18 NOTE — Progress Notes (Signed)
PMT consult received and chart reviewed. Discussed with PCCM NP, Shirlee Limerick and RN. Patient assessment complete. Call placed to wife, Gilberto Stanforth. No answer and unable to leave vm. Will attempt again later.   Notified RN to document contact information if she hears from niece this afternoon.   NO CHARGE  Ihor Dow, [place], FNP-C Palliative Medicine Team  Phone: [phone] Fax: [phone]

## 2018-11-18 NOTE — Progress Notes (Signed)
ANTICOAGULATION CONSULT NOTE  Pharmacy Consult for Heparin Indication: chest pain/ACS  Heparin Dosing Weight: 56.2 kg  Labs: Recent Labs    11/16/18 1241  11/16/18 1843  11/17/18 0103 11/17/18 0114 11/17/18 0713 11/17/18 0937 11/17/18 1155 11/17/18 2135 11/18/18 0327 11/18/18 0804 11/18/18 0929  HGB 13.0   < >  --   --   --   --  11.8*  --   --   --  10.5* 9.2*  --   HCT 39.6   < >  --   --   --   --  35.1*  --   --   --  32.4* 27.0*  --   PLT 181  --   --   --   --   --  125*  --   --   --  136*  --   --   APTT 30  --   --   --   --   --   --   --   --   --   --   --  74*  LABPROT 14.9  --   --   --   --  17.8*  --   --   --   --   --   --   --   INR 1.2  --   --   --   --  1.5*  --   --   --   --   --   --   --   HEPARINUNFRC  --   --   --    < > 1.84*  --   --   --  1.92* 1.10*  --   --  0.38  CREATININE 3.46*   < > 2.98*  --   --  3.00*  --   --   --   --  2.28*  --   --   TROPONINI  --    < > 13.43*  --  16.76*  --   --  15.79*  --   --   --   --   --    < > = values in this interval not displayed.    Assessment: 88 yom presenting with AMS, vomiting, elevated BP. Pt intubated in ER, CT head neg for bleed. Pharmacy consulted to dose heparin for ACS. No anticoagulation PTA documented in fax sent from patient's pharmacy. PTA. Hg 10.5, plt wnl. No active bleed issues documented.  Heparin level therapeutic at 0.38 on heparin 150 units/hr; aPTT correlates with HL. Hb decreased to 9.2 today. No signs/symptoms of bleeding or issues with infusion reported by nursing.   Goal of Therapy:  Heparin level 0.3-0.7 units/ml Monitor platelets by anticoagulation protocol: Yes   Plan:  - Continue heparin at 150 units/hr  - Heparin infusion will stop at 1700, for a total of 48 hour duration per Cardiology - Will not check confirmatory level in 8 hours due to stop time.   Claiborne Billings, PharmD PGY2 Cardiology Pharmacy Resident Please check AMION for all Pharmacist numbers by  unit 11/18/2018 10:16 AM

## 2018-11-18 NOTE — Progress Notes (Addendum)
NAME:  Stephen Grimes, MRN:  [id number], DOB:  25-Oct-1950, LOS: 2 ADMISSION DATE:  11/16/2018, CONSULTATION DATE: 4/26  REFERRING MD:  EDP PA Joy , CHIEF COMPLAINT:  AMS/Resp Failure   Brief History   68 yo male with metastatic renal carcinoma , alcoholic cirrhosis reported altered at home, EMS transported to ER altered with vomiting and elevated b/p . Intubated in ER . CT Head neg for acute . COVID ruled out . Ammonia elevated. EKG with ST elevation   History of present illness   68 yo male with metastatic renal carcinoma , alcoholic cirrhosis altered at home . EMS transported to ER  Limited history concerning presentation. No family in ER .  Marland Kitchen Lives with wife at home that has dementia.   On arrival to ER with worsening mental function , vomiting and elevated b/p . Was intubated in ER for airway protection .  ER workup with CT head neg for acute process. COVID ruled out. Ammonia elevated. Chest xray clear . Afebrile and normal wbc. Bigemny and ectopy increased in ER . Repeat EKG with significant ST elevation . Cardiology consulted. Remained unresponsive off sedation . On exam no reflexes /gag . Neuro consulted. Scr high on arrival at 3.8.  Patient did require Labetolol for frequent ectopy . He was started on Heparin for presumed acute MI . K+ low in ER . K+ replaced.     Past Medical History  Metastatic renal CA  HTN  Hypothyroid   Significant Hospital Events     Consults:  4/26 Neuo  4.26 Cards   Procedures:  4/26 ETT >>   Significant Diagnostic Tests:  4/26 CT Head >no acute  4/26 MRI >> 4/27 EEG> moderated global encephalopathy, no epileptiform activity  4/27 ECHO> LVEF 20%. Akinesis of mid to apical LV 4/28 CXR> no acute cardiopulmonary abnormality  Micro Data:  4/28 MRSA> neg 4/26 UCx> no growth  4/26 BCx> no growth 4/26 SARS CoV-2> not detected  Antimicrobials:     Interim history/subjective:  Overnight started on bicarb gtt for non-gap acidosis    Objective    Blood pressure 107/75, pulse 68, temperature 98.8 F (37.1 C), resp. rate 18, height 5\' 7"  (1.702 m), weight 51.2 kg, SpO2 100 %.    Vent Mode: PRVC FiO2 (%):  [30 %-40 %] 30 % Set Rate:  [16 bmp] 16 bmp Vt Set:  [520 mL] 520 mL PEEP:  [5 cmH20] 5 cmH20 Plateau Pressure:  [11 cmH20-12 cmH20] 11 cmH20   Intake/Output Summary (Last 24 hours) at 11/18/2018 0743 Last data filed at 11/18/2018 0700 Gross per 24 hour  Intake 2562.39 ml  Output 1900 ml  Net 662.39 ml   Filed Weights   11/16/18 1250 11/17/18 0600 11/18/18 0500  Weight: 56.2 kg 53.4 kg 51.2 kg    Examination: General: Elderly, cachectic appearing male, intubated, in bed, NAD  HENT: NCAT, ETT secure. Trachea midline. Anicteric sclera. Pink mmm. Temporal muscle wasting.  Lungs: CTA bilaterally, symmetrical chest expansion, no accessory muscle recruitment  Cardiovascular: RRR s1s2 . No JVD. 1+ radial pulses  Abdomen: Soft, flat, ndnt, hyperactive x4  Extremities: Symmetrical muscle wasting. No edema. No obvious joint deformity. No clubbing or cyanosis  Neuro: Does not awaken to noxious stimuli. Does not follow commands. Withdraws to pain. Moves BUE BLE spontaneously. 38mm pupils  Skin: clean, dry, warm without rash   Resolved Hospital Problem list     Assessment & Plan:   Acute respiratory failure requiring mechanical ventilation -inability  to protect airway COVID-19 negative 4/26 CXR no acute abnormality  P  Continue vent support Titrate PEEP/FiO2 for SpO2 goal 88-92% qAM WUA/SBT, PSV/CPAP as tolerated  Mental status is barrier to extubation  Continue VAP bundle  RASS goal 0 to -1   Acute on chronic kidney disease  Baseline Cr 3 Acute kidney injury may be related to hypovolemia in setting of emesis, diarrhea Acute compensated metabolic acidosis likely related to diarrhea Metastatic renal cell carcinoma P Continue to trend BMP Continue bicarb gtt, follow up gas at 8a  Avoid nephrotoxic medications as able  Continue IVF   Electrolyte abnormalities Hypokalemia Hypomagnesemia, improved  P Trend BMP mag phos Replace PRN K+ goal >4 to prevent dysrhythmia   Acute encephalopathy etiology hepatic vs toxic metabolic?  EEG 4/27 consistent with moderate encephalopathy, no epileptiform activity EtOH abuse with cirrhosis  CT head 4/26 neg for acute , ammonia elevated  P Neuro following, appreciate recs MRI order cancelled Anticipate impaired metabolism/excretion of sedating medications due to underlying kidney/liver dysfunction  Minimize sedation, PRN fent and versed Lactulose BID, continue to trend ammonia Continue frequent neuro exams   Acute systolic heart failure, LVEF 20% -takotsubo cardiomyopathy vs LAD infarct  -Formal ECHO 4/27 with LVEF 20%, mid to apical LV segment akinesis  HTN Home meds: norvasc, losartan  Abnormal EKG with diffuse ST segment elevation  -elevated troponin peaking at 16.76 P Cardiology consulted appreciate recs Per cardiology, given severity of illness and high risk of mortality, not a candidate for invasive procedure If ALT remains normal can consider statin Continue heparin gtt per pharmacy  Continue ASA  Metop 12.5mg  BID given no signs of cardiogenic shock. If hypotensive, will dc  K+ goal > 4 to prevent dysrhythmia   Anemia  P  Trend CBC, transfuse per unit protocol   Hypothyroidism  Likely non-compliant with home synthroid (152mcg)  TSH 128 P Continue 66mcg for now, with ability to increase to 75, 116mcg if needed.   Goals of Care: At this time, patient is full code Of note, wife has dementia  Patient with underlying metastatic renal carcinoma, baseline EtOH abuse with cirrhosis  Acute kidney injury on chronic renal injury Acute systolic heart failure, EF 20%, and diffuse ST segment elevation with elevated troponins. Patient is not a candidate for cardiac intervention given severity of present illness. Acute encephalopathy likely related to  chronic hepatic and renal disease  Due to severity of acute illness and chronic illness, patient prognosis likely poor. Palliative Care Medicine has been consulted and we appreciate their assistance   Best practice:  Diet: NPO  Pain/Anxiety/Delirium protocol (if indicated): PRN vent/versed  VAP protocol (if indicated): in place  DVT prophylaxis: heparin gtt   GI prophylaxis: Protonix  Glucose control: monitor  Mobility: BR  Code Status: Full  Family Communication: Will attempt to reach family this morning  Disposition: ICU   Labs   CBC: Recent Labs  Lab 11/16/18 1241 11/16/18 1301 11/16/18 1402 11/16/18 1618 11/17/18 0713 11/18/18 0327  WBC 5.7  --   --   --  7.5 6.4  NEUTROABS 3.5  --   --   --   --   --   HGB 13.0 13.9 10.5* 12.9* 11.8* 10.5*  HCT 39.6 41.0 31.0* 38.0* 35.1* 32.4*  MCV 88.6  --   --   --  87.5 89.8  PLT 181  --   --   --  125* 136*    Basic Metabolic Panel: Recent Labs  Lab  11/16/18 1241 11/16/18 1246  11/16/18 1402 11/16/18 1618 11/16/18 1843 11/17/18 0114 11/18/18 0327  NA 136  --    < > 142 142 141 142 144  K 3.4*  --    < > 2.6* 3.8 4.4 4.0 3.5  CL 112*  --   --   --   --  117* 120* 123*  CO2 9*  --   --   --   --  11* 8* 10*  GLUCOSE 96  --   --   --   --  150* 87 160*  BUN 72*  --   --   --   --  61* 60* 49*  CREATININE 3.46* 3.80*  --   --   --  2.98* 3.00* 2.28*  CALCIUM 9.9  --   --   --   --  8.4* 8.8* 8.9  MG  --   --   --   --   --   --  1.3* 2.1  PHOS  --   --   --   --   --   --  3.9 2.7   < > = values in this interval not displayed.   GFR: Estimated Creatinine Clearance: 22.1 mL/min (A) (by C-G formula based on SCr of 2.28 mg/dL (H)). Recent Labs  Lab 11/16/18 1241 11/16/18 1713 11/17/18 0713 11/18/18 0327  PROCALCITON  --  <0.10  --   --   WBC 5.7  --  7.5 6.4  LATICACIDVEN  --  3.7*  --   --     Liver Function Tests: Recent Labs  Lab 11/16/18 1241 11/17/18 0114 11/18/18 0327  AST 51* 94* 78*  ALT 33 30 28   ALKPHOS 155* 121 118  BILITOT 1.1 1.3* 0.9  PROT 8.7* 7.1 6.2*  ALBUMIN 4.3 3.3* 2.7*   No results for input(s): LIPASE, AMYLASE in the last 168 hours. Recent Labs  Lab 11/16/18 1241 11/17/18 0713 11/18/18 0327  AMMONIA 133* 62* 71*    ABG    Component Value Date/Time   PHART 7.381 11/18/2018 0345   PCO2ART BELOW REPORTABLE RANGE 11/18/2018 0345   PO2ART 178.0 (H) 11/18/2018 0345   HCO3 9.2 (L) 11/18/2018 0345   TCO2 9 (L) 11/16/2018 1618   ACIDBASEDEF 15.2 (H) 11/18/2018 0345   O2SAT 99.1 11/18/2018 0345     Coagulation Profile: Recent Labs  Lab 11/16/18 1241 11/17/18 0114  INR 1.2 1.5*    Cardiac Enzymes: Recent Labs  Lab 11/16/18 1713 11/16/18 1843 11/17/18 0103 11/17/18 0937  TROPONINI 8.55* 13.43* 16.76* 15.79*    HbA1C: No results found for: HGBA1C  CBG: Recent Labs  Lab 11/17/18 1312 11/17/18 1545 11/17/18 1958 11/17/18 2359 11/18/18 0439  GLUCAP 72 88 112* 122* 125*    Critical care time: 45 min     Eliseo Gum MSN, AGACNP-BC [place] [phone] If no answer, [phone] 11/18/2018, 7:45 AM

## 2018-11-18 NOTE — Progress Notes (Signed)
Progress Note  Patient Name: Stephen Grimes Date of Encounter: 11/18/2018  Primary Cardiologist: New to [facility], Dr. Candee Furbish, MD   Subjective   On vent. Opened eyes to touch.   Inpatient Medications    Scheduled Meds:  aspirin  81 mg Per Tube Daily   chlorhexidine gluconate (MEDLINE KIT)  15 mL Mouth Rinse BID   clopidogrel  75 mg Per Tube Daily   lactulose  30 g Per Tube BID   levothyroxine  50 mcg Oral Q0600   mouth rinse  15 mL Mouth Rinse 10 times per day   metoprolol tartrate  12.5 mg Oral BID   pantoprazole (PROTONIX) IV  40 mg Intravenous QHS   rifaximin  550 mg Per Tube BID   thiamine injection  100 mg Intravenous Daily   Continuous Infusions:  dextrose 5 % and 0.9% NaCl 50 mL/hr at 11/18/18 0841   feeding supplement (VITAL AF 1.2 CAL) 55 mL/hr at 11/17/18 1800   heparin 150 Units/hr (11/18/18 0800)    sodium bicarbonate (isotonic) infusion in sterile water 75 mL/hr at 11/18/18 0800   PRN Meds: acetaminophen, albuterol, fentaNYL (SUBLIMAZE) injection, labetalol, midazolam, ondansetron (ZOFRAN) IV, sennosides   Vital Signs    Vitals:   11/18/18 0700 11/18/18 0730 11/18/18 0800 11/18/18 0830  BP: 107/75 116/75 116/75 138/77  Pulse:    68  Resp: '18 19 16 ' (!) 22  Temp: 98.8 F (37.1 C) 98.8 F (37.1 C) 98.8 F (37.1 C) 98.6 F (37 C)  TempSrc:      SpO2:    96%  Weight:      Height:        Intake/Output Summary (Last 24 hours) at 11/18/2018 0858 Last data filed at 11/18/2018 0800 Gross per 24 hour  Intake 2693.88 ml  Output 1575 ml  Net 1118.88 ml   Filed Weights   11/16/18 1250 11/17/18 0600 11/18/18 0500  Weight: 56.2 kg 53.4 kg 51.2 kg    Physical Exam   GEN: ill appearing thin on vent.  HEENT: ETT.  Neck: Supple, no JVD, carotid bruits, or masses. Cardiac: RRR, no murmurs, rubs, or gallops. No clubbing, cyanosis, edema.  Respiratory:  Respirations regular and unlabored on vent GI: Soft, nontender, nondistended, BS  + x 4. MS: no deformity or atrophy. Skin: warm and dry, no rash. Neuro:  Starting to make more purposeful movements. Psych: unable  Labs    Chemistry Recent Labs  Lab 11/16/18 1241  11/16/18 1843 11/17/18 0114 11/18/18 0327 11/18/18 0804  NA 136   < > 141 142 144 147*  K 3.4*   < > 4.4 4.0 3.5 3.5  CL 112*  --  117* 120* 123*  --   CO2 9*  --  11* 8* 10*  --   GLUCOSE 96  --  150* 87 160*  --   BUN 72*  --  61* 60* 49*  --   CREATININE 3.46*   < > 2.98* 3.00* 2.28*  --   CALCIUM 9.9  --  8.4* 8.8* 8.9  --   PROT 8.7*  --   --  7.1 6.2*  --   ALBUMIN 4.3  --   --  3.3* 2.7*  --   AST 51*  --   --  94* 78*  --   ALT 33  --   --  30 28  --   ALKPHOS 155*  --   --  121 118  --  BILITOT 1.1  --   --  1.3* 0.9  --   GFRNONAA 17*  --  20* 20* 28*  --   GFRAA 20*  --  24* 23* 33*  --   ANIONGAP 15  --  '13 14 11  ' --    < > = values in this interval not displayed.     Hematology Recent Labs  Lab 11/16/18 1241  11/17/18 0713 11/18/18 0327 11/18/18 0804  WBC 5.7  --  7.5 6.4  --   RBC 4.47  --  4.01* 3.61*  --   HGB 13.0   < > 11.8* 10.5* 9.2*  HCT 39.6   < > 35.1* 32.4* 27.0*  MCV 88.6  --  87.5 89.8  --   MCH 29.1  --  29.4 29.1  --   MCHC 32.8  --  33.6 32.4  --   RDW 21.8*  --  22.2* 22.9*  --   PLT 181  --  125* 136*  --    < > = values in this interval not displayed.    Cardiac Enzymes Recent Labs  Lab 11/16/18 1713 11/16/18 1843 11/17/18 0103 11/17/18 0937  TROPONINI 8.55* 13.43* 16.76* 15.79*   No results for input(s): TROPIPOC in the last 168 hours.   BNPNo results for input(s): BNP, PROBNP in the last 168 hours.   DDimer No results for input(s): DDIMER in the last 168 hours.   Radiology    Dg Abdomen 1 View  Result Date: 11/16/2018 CLINICAL DATA:  Repositioning of orogastric tube. EXAM: ABDOMEN - 1 VIEW COMPARISON:  Earlier the same day. FINDINGS: 1340 hours. The orogastric tube has been partially retracted, tip in the gastric fundus. No other  significant changes are identified. There is gas throughout the small and large bowel, but no significant distension. Surgical clips are present in the right upper quadrant. IMPRESSION: Orogastric tube tip in the gastric fundus, less looped in the proximal stomach. Electronically Signed   By: Richardean Sale M.D.   On: 11/16/2018 14:07   Ct Head Wo Contrast  Result Date: 11/16/2018 CLINICAL DATA:  Found unresponsive. EXAM: CT HEAD WITHOUT CONTRAST TECHNIQUE: Contiguous axial images were obtained from the base of the skull through the vertex without intravenous contrast. COMPARISON:  None. FINDINGS: Brain: No evidence of acute infarction, hemorrhage, hydrocephalus, extra-axial collection or mass lesion/mass effect. Vascular: Calcification of the cavernous internal carotid arteries consistent with cerebrovascular atherosclerotic disease. No signs of intracranial large vessel occlusion. Skull: Normal. Negative for fracture or focal lesion. Sinuses/Orbits: No acute finding. Other: None. Electronically Signed   By: Staci Righter M.D.   On: 11/16/2018 13:29   Dg Chest Port 1 View  Result Date: 11/18/2018 CLINICAL DATA:  Acute respiratory failure. EXAM: PORTABLE CHEST 1 VIEW COMPARISON:  Radiograph of November 17, 2018. FINDINGS: The heart size and mediastinal contours are within normal limits. Endotracheal nasogastric tubes are unchanged in position. No pneumothorax or pleural effusion is noted. Both lungs are clear. The visualized skeletal structures are unremarkable. IMPRESSION: Stable support apparatus. No acute cardiopulmonary abnormality seen. Electronically Signed   By: Marijo Conception M.D.   On: 11/18/2018 07:28   Dg Chest Port 1 View  Result Date: 11/17/2018 CLINICAL DATA:  Respiratory failure. EXAM: PORTABLE CHEST 1 VIEW COMPARISON:  Radiograph of November 16, 2018. FINDINGS: The heart size and mediastinal contours are within normal limits. Endotracheal and nasogastric tubes are unchanged in position. No  pneumothorax or pleural effusion is noted. Both lungs  are clear. The visualized skeletal structures are unremarkable. IMPRESSION: Stable support apparatus. No acute cardiopulmonary abnormality seen. Electronically Signed   By: Marijo Conception M.D.   On: 11/17/2018 07:58   Dg Chest Portable 1 View  Result Date: Nov 21, 2018 CLINICAL DATA:  Intubated patient. EXAM: PORTABLE CHEST 1 VIEW COMPARISON:  None. FINDINGS: 1314 hours. Tip of the endotracheal tube is in the mid trachea. An orogastric tube is looped in the proximal stomach. The heart size and mediastinal contours are normal. There is mild aortic atherosclerosis. The lungs are clear. There is no pleural effusion or pneumothorax. No acute osseous findings are evident. IMPRESSION: Satisfactory position of the endotracheal and orogastric tubes. No acute cardiopulmonary process identified. Electronically Signed   By: Richardean Sale M.D.   On: 21-Nov-2018 14:06   Dg Abd Portable 1 View  Result Date: Nov 21, 2018 CLINICAL DATA:  Orogastric tube placement. EXAM: PORTABLE ABDOMEN - 1 VIEW COMPARISON:  None. FINDINGS: 1317 hours. Orogastric tube is looped in the proximal stomach. There is gas throughout the small and large bowel, but no significant bowel distension. There is no free intraperitoneal air. Surgical clips are present in the right upper quadrant of the abdomen. IMPRESSION: Orogastric tube is looped in the proximal stomach. Electronically Signed   By: Richardean Sale M.D.   On: 11/21/18 14:05   Telemetry    NSR occasional PVC - Personally Reviewed  ECG    No new tracing as of 11/18/2018- Personally Reviewed  Cardiac Studies   Bedside echocardiogram 21-Nov-2018: -Severely reduced LV function at 15 to 20% with basilar sparing.  11/17/18 ECHO:  1. The left ventricle has a visually estimated ejection fraction of 20%. The cavity size was normal. Left ventricular diastolic Doppler parameters are consistent with impaired relaxation. Akinesis of the  mid to apical LV segments with normal function of  the basilar LV segments. This is consistent with stress (Takotsubo) cardiomyopathy versus infarction in territory of large wrap-around LAD.  2. The right ventricle has normal systolic function. The cavity was normal. There is no increase in right ventricular wall thickness.  3. The aortic valve is tricuspid. No stenosis of the aortic valve.  4. The aortic root is normal in size and structure.  5. The inferior vena cava was normal in size with <50% respiratory variability. PA systolic pressure 26 mmHg.  6. No evidence of mitral valve stenosis. No significant regurgitation  Patient Profile     68 y.o. male with a hx of metastatic renal cell Ca, alcoholic cirrhosiswho is being seen today for the evaluation of EKG changesat the request of Dr. Alva Garnet.  Assessment & Plan    1.  Abnormal EKG with diffuse ST segment elevation, acute anterior wall myocardial infarction: -Stat echocardiogram performed Nov 21, 2018 with evidence of possible Takotsubo type appearance with marked reduction in LVEF at approximately 15 to 20%.   -EKG evolved on arrival with diffuse ST segment elevation, anterior predominance.  Currently Q waves noted in V2 through V4, likely in the setting of a large infarct involving the proximal LAD that would wrap around the apex and supply a large portion of the inferior wall, i.e. left main equivalent could also be a possibility. -Given neurologic condition at present as well as underlying metastatic renal cell cancer, is not currently a candidate for further invasive therapies. -Troponin levell to be significantly elevated at 8.55> 13.43> 16.76 > 15.8>>>downward trending  -Continue ASA 81, heparin gtt pharmacy should be 48 hours this afternoon. -Low-dose beta-blocker started yesterday -Plavix  75 mg also started yesterday.  Watch for any evidence of bleeding.  Optimized MI treatment. -Plan at this time for aggressive supportive care and  possible palliative care consultation given his overall poor prognosis.  Unable to contact family.  2.  Acute respiratory failure: -Continues with ETT per PCCM, on pressure support.  PCO2 17,18 consistent with hyperventilation. -CXR without acute cardiopulmonary abnormality -Poor mental status for SBT  3.  Chronic kidney disease stage IV: -Creatinine,  2.28 today, down from 3.0 yesterday -Baseline appears to be in the 3.0 range with known metastatic renal cell carcinoma -Avoid nephrotoxic medications  4.  Renal cell carcinoma, metastatic: -See #3.  Recent worsening noted at [facility] with periaortic lymph nodes.  5.  Acute encephalopathy: -Patient with history of hepatic cirrhosis secondary to EtOH -Now with hepatic versus toxic metabolic encephalopathy -Elevated ammonia>> worsening now up to 71 from 62 yesterday -EEG with diffuse encephalopathy and no seizure activity  Signed, Kathyrn Drown NP-C [place] Pager: [phone] 11/18/2018, 8:58 AM     For questions or updates, please contact   Please consult [email or web address] for contact info under Cardiology/STEMI.  Personally seen and examined. Agree with above.  Anterior MI Hepatic encephalopathy Renal cell carcinoma metastatic Chronic kidney disease Metabolic acidosis  - Medical management of his myocardial infarction.  Peak troponin 16.  Should be ending his IV heparin today. -Continue with aspirin, Plavix, low-dose beta-blocker as tolerated.  I avoided statin therapy given his underlying cirrhosis. - Appreciate palliative care team attempting to contact family.  Obvious poor prognosis.  Neurologically he is beginning to respond more.  Critical care time 35 minutes spent with patient, discussed with nursing care team, data analysis, echo analysis in setting of this gentleman with multisystem organ failure  Candee Furbish, MD

## 2018-11-18 NOTE — Progress Notes (Signed)
Reason for consult:   Subjective: Patient intubated, opens eyes spontaneously and is moving all 4 extremities spontaneously.  Not following any commands.   ROS:  Unable to obtain due to poor mental status  Examination  Vital signs in last 24 hours: Temp:  [98.6 F (37 C)-99.9 F (37.7 C)] 98.6 F (37 C) (04/28 1630) Pulse Rate:  [65-74] 71 (04/28 1630) Resp:  [15-22] 16 (04/28 1630) BP: (84-138)/(61-85) 115/71 (04/28 1630) SpO2:  [96 %-100 %] 100 % (04/28 1630) FiO2 (%):  [30 %-40 %] 30 % (04/28 1515) Weight:  [51.2 kg] 51.2 kg (04/28 0500)  General: lying in bed CVS: pulse-normal rate and rhythm RS: breathing comfortably with assistance of ventilator Extremities: normal   Neuro: ( intubated) MS: Patient not following commands but opens eyes spontaneously. Patient  not tracking examiner.  CN: Pupils are equal and reactive, doll's present, corneal reflex present, gag reflex present, no forced gaze deviation.   Motor: Moving all 4 extremities spontaneously with good strength Sensory: Limited due to intubation, withdraws bilaterally to stimulus Coordination: Testing not possible Gait:  testing not possible   Basic Metabolic Panel: Recent Labs  Lab 11/16/18 1241 11/16/18 1246  11/16/18 1618 11/16/18 1843 11/17/18 0114 11/18/18 0327 11/18/18 0804  NA 136  --    < > 142 141 142 144 147*  K 3.4*  --    < > 3.8 4.4 4.0 3.5 3.5  CL 112*  --   --   --  117* 120* 123*  --   CO2 9*  --   --   --  11* 8* 10*  --   GLUCOSE 96  --   --   --  150* 87 160*  --   BUN 72*  --   --   --  61* 60* 49*  --   CREATININE 3.46* 3.80*  --   --  2.98* 3.00* 2.28*  --   CALCIUM 9.9  --   --   --  8.4* 8.8* 8.9  --   MG  --   --   --   --   --  1.3* 2.1  --   PHOS  --   --   --   --   --  3.9 2.7  --    < > = values in this interval not displayed.    CBC: Recent Labs  Lab 11/16/18 1241  11/16/18 1402 11/16/18 1618 11/17/18 0713 11/18/18 0327 11/18/18 0804  WBC 5.7  --   --   --   7.5 6.4  --   NEUTROABS 3.5  --   --   --   --   --   --   HGB 13.0   < > 10.5* 12.9* 11.8* 10.5* 9.2*  HCT 39.6   < > 31.0* 38.0* 35.1* 32.4* 27.0*  MCV 88.6  --   --   --  87.5 89.8  --   PLT 181  --   --   --  125* 136*  --    < > = values in this interval not displayed.     Coagulation Studies: Recent Labs    11/16/18 1241 11/17/18 0114  LABPROT 14.9 17.8*  INR 1.2 1.5*    Imaging Reviewed:     ASSESSMENT AND PLAN  Hepatic coma-resolving Multifactorial metabolic encephalopathy  Recommendations -Continue to treat underlying metabolic conditions and correcting Ammonia -Given metastatic cancer and multiorgan failure and appears ongoing discussion between family, critical care  medicine, oncology and palliative care.  Neurology will be available as needed.    Karena Addison Aroor [facility] Pager Number [phone] For questions after 7pm please refer to AMION to reach the Neurologist on call

## 2018-11-18 NOTE — Progress Notes (Signed)
PCCM Brief Interval Note  Patient is followed by [facility], Lorre Munroe NP  for metastatic renal carcinoma.   Notified of admission.  Ms. Luan Pulling stated that the patient is on his last round of therapy and does not have further options for oncologic management of metastatic renal carcinoma, and recommends palliation. The patient was last seen via tele visit in early April, at which time his wife had expressed concerns for worsening weakness and fatigue following a hospitalization in February for severe pneumonia.    Ms. Luan Pulling has graciously offered to be available via phone [phone] for any help that she can provide and sends her best to the patient.    Eliseo Gum MSN, AGACNP-BC [place] [phone] If no answer, [phone] 11/18/2018, 1:48 PM

## 2018-11-19 ENCOUNTER — Inpatient Hospital Stay (HOSPITAL_COMMUNITY): Payer: Medicare Other

## 2018-11-19 DIAGNOSIS — I5021 Acute systolic (congestive) heart failure: Secondary | ICD-10-CM

## 2018-11-19 DIAGNOSIS — C649 Malignant neoplasm of unspecified kidney, except renal pelvis: Secondary | ICD-10-CM

## 2018-11-19 LAB — COMPREHENSIVE METABOLIC PANEL
ALT: 40 U/L (ref 0–44)
AST: 101 U/L — ABNORMAL HIGH (ref 15–41)
Albumin: 2.4 g/dL — ABNORMAL LOW (ref 3.5–5.0)
Alkaline Phosphatase: 140 U/L — ABNORMAL HIGH (ref 38–126)
Anion gap: 13 (ref 5–15)
BUN: 45 mg/dL — ABNORMAL HIGH (ref 8–23)
CO2: 16 mmol/L — ABNORMAL LOW (ref 22–32)
Calcium: 8.5 mg/dL — ABNORMAL LOW (ref 8.9–10.3)
Chloride: 121 mmol/L — ABNORMAL HIGH (ref 98–111)
Creatinine, Ser: 1.72 mg/dL — ABNORMAL HIGH (ref 0.61–1.24)
GFR calc Af Amer: 46 mL/min — ABNORMAL LOW (ref >60)
GFR calc non Af Amer: 40 mL/min — ABNORMAL LOW (ref >60)
Glucose, Bld: 110 mg/dL — ABNORMAL HIGH (ref 70–99)
Potassium: 3.3 mmol/L — ABNORMAL LOW (ref 3.5–5.1)
Sodium: 150 mmol/L — ABNORMAL HIGH (ref 135–145)
Total Bilirubin: 0.7 mg/dL (ref 0.3–1.2)
Total Protein: 5.6 g/dL — ABNORMAL LOW (ref 6.5–8.1)

## 2018-11-19 LAB — CBC
HCT: 28.6 % — ABNORMAL LOW (ref 39.0–52.0)
Hemoglobin: 9.6 g/dL — ABNORMAL LOW (ref 13.0–17.0)
MCH: 29.6 pg (ref 26.0–34.0)
MCHC: 33.6 g/dL (ref 30.0–36.0)
MCV: 88.3 fL (ref 80.0–100.0)
Platelets: 119 10*3/uL — ABNORMAL LOW (ref 150–400)
RBC: 3.24 MIL/uL — ABNORMAL LOW (ref 4.22–5.81)
RDW: 23.2 % — ABNORMAL HIGH (ref 11.5–15.5)
WBC: 6 10*3/uL (ref 4.0–10.5)
nRBC: 2.3 % — ABNORMAL HIGH (ref 0.0–0.2)

## 2018-11-19 LAB — GLUCOSE, CAPILLARY
Glucose-Capillary: 80 mg/dL (ref 70–99)
Glucose-Capillary: 120 mg/dL — ABNORMAL HIGH (ref 70–99)
Glucose-Capillary: 103 mg/dL — ABNORMAL HIGH (ref 70–99)
Glucose-Capillary: 121 mg/dL — ABNORMAL HIGH (ref 70–99)
Glucose-Capillary: 64 mg/dL — ABNORMAL LOW (ref 70–99)
Glucose-Capillary: 51 mg/dL — ABNORMAL LOW (ref 70–99)
Glucose-Capillary: 87 mg/dL (ref 70–99)

## 2018-11-19 LAB — MAGNESIUM: Magnesium: 1.9 mg/dL (ref 1.7–2.4)

## 2018-11-19 LAB — AMMONIA: Ammonia: 35 umol/L (ref 9–35)

## 2018-11-19 LAB — PHOSPHORUS: Phosphorus: 1.3 mg/dL — ABNORMAL LOW (ref 2.5–4.6)

## 2018-11-19 MED ORDER — DEXTROSE-NACL 5-0.45 % IV SOLN
INTRAVENOUS | Status: DC
  Administered 2018-11-19 – 2018-11-20 (×3): via INTRAVENOUS

## 2018-11-19 MED ORDER — DEXTROSE 50 % IV SOLN
INTRAVENOUS | Status: AC
  Administered 2018-11-19: 20:00:00 50 mL
  Filled 2018-11-19: qty 50

## 2018-11-19 MED ORDER — HEPARIN SODIUM (PORCINE) 5000 UNIT/ML IJ SOLN
5000.0000 [IU] | Freq: Three times a day (TID) | INTRAMUSCULAR | Status: DC
  Administered 2018-11-19 – 2018-11-23 (×13): 5000 [IU] via SUBCUTANEOUS
  Filled 2018-11-19 (×13): qty 1

## 2018-11-19 MED ORDER — DEXTROSE 50 % IV SOLN
INTRAVENOUS | Status: AC
  Administered 2018-11-19: 25 mL
  Filled 2018-11-19: qty 50

## 2018-11-19 MED ORDER — POTASSIUM PHOSPHATES 15 MMOLE/5ML IV SOLN
30.0000 mmol | Freq: Once | INTRAVENOUS | Status: AC
  Administered 2018-11-19: 30 mmol via INTRAVENOUS
  Filled 2018-11-19: qty 10

## 2018-11-19 MED ORDER — POTASSIUM CHLORIDE 20 MEQ/15ML (10%) PO SOLN
40.0000 meq | Freq: Two times a day (BID) | ORAL | Status: DC
  Administered 2018-11-19: 40 meq
  Filled 2018-11-19: qty 30

## 2018-11-19 MED ORDER — ORAL CARE MOUTH RINSE
15.0000 mL | Freq: Two times a day (BID) | OROMUCOSAL | Status: DC
  Administered 2018-11-19 – 2018-11-23 (×9): 15 mL via OROMUCOSAL

## 2018-11-19 MED ORDER — PANTOPRAZOLE SODIUM 40 MG PO PACK
40.0000 mg | PACK | Freq: Every day | ORAL | Status: DC
  Administered 2018-11-19 – 2018-11-20 (×2): 40 mg
  Filled 2018-11-19 (×2): qty 20

## 2018-11-19 NOTE — Progress Notes (Signed)
Hypoglycemic Event  CBG: 64  Treatment: D50 25 mL (12.5 gm)  Symptoms: None  Follow-up CBG: Time:16:10 CBG Result:121  Possible Reasons for Event: Inadequate meal intake   Stephen Grimes

## 2018-11-19 NOTE — Evaluation (Signed)
Clinical/Bedside Swallow Evaluation Patient Details  Name: Stephen Grimes MRN: [id number] Date of Birth: 1950-08-21  Today's Date: 11/19/2018 Time: SLP Start Time (ACUTE ONLY): 1446 SLP Stop Time (ACUTE ONLY): 1459 SLP Time Calculation (min) (ACUTE ONLY): 13 min  Past Medical History:  Past Medical History:  Diagnosis Date  . Alcoholic cirrhosis ([place])   . Cirrhosis ([place])   . HTN (hypertension)   . Metastatic renal cell carcinoma ([place])   . Renal cancer ([place])   . Renal impairment   . Thyroid disease    Past Surgical History: History reviewed. No pertinent surgical history. HPI:  68 yo male with metastatic renal carcinoma , alcoholic cirrhosis reported altered at home, EMS transported to ER altered with vomiting and elevated b/p . Intubated in ER . CT Head neg for acute . COVID ruled out . Ammonia elevated. EKG with ST elevation. Intubated 4/26-4/29.    Assessment / Plan / Recommendation Clinical Impression  Pt demonstrates clinical signs of dysphagia including baseline wet vocal quality and grimacing with swallowing. Trials of thin liquids raise concern for aspiration given multiple swallows with appearance of poor coordination of swallowing and breathing as well as coughing following large volume intake (3 oz water swallow). Pt did tolerate trials of puree without difficulty. Recommend pt consume meds in puree as needed and ice chips for comfort, will f/u for further trials as pt continues to recover after extubation today.  SLP Visit Diagnosis: Dysphagia, oropharyngeal phase (R13.12)    Aspiration Risk  Moderate aspiration risk    Diet Recommendation NPO except meds;Ice chips PRN after oral care   Medication Administration: Whole meds with puree    Other  Recommendations Oral Care Recommendations: Oral care QID Other Recommendations: Have oral suction available   Follow up Recommendations Skilled Nursing facility      Frequency and Duration min 2x/week  1 week        Prognosis Prognosis for Safe Diet Advancement: Good      Swallow Study   General HPI: 68 yo male with metastatic renal carcinoma , alcoholic cirrhosis reported altered at home, EMS transported to ER altered with vomiting and elevated b/p . Intubated in ER . CT Head neg for acute . COVID ruled out . Ammonia elevated. EKG with ST elevation. Intubated 4/26-4/29.  Type of Study: Bedside Swallow Evaluation Diet Prior to this Study: NPO Temperature Spikes Noted: No Respiratory Status: Nasal cannula History of Recent Intubation: Yes Length of Intubations (days): 4 days Date extubated: 11/19/18 Behavior/Cognition: Alert;Cooperative;Requires cueing Oral Cavity Assessment: Within Functional Limits Oral Care Completed by SLP: Yes Oral Cavity - Dentition: Dentures, top;Dentures, bottom Self-Feeding Abilities: Needs assist Patient Positioning: Upright in bed Baseline Vocal Quality: Hoarse;Low vocal intensity;Wet Volitional Cough: Strong Volitional Swallow: Unable to elicit    Oral/Motor/Sensory Function Overall Oral Motor/Sensory Function: Within functional limits   Ice Chips Ice chips: Within functional limits   Thin Liquid Thin Liquid: Impaired Presentation: Cup;Straw Pharyngeal  Phase Impairments: Suspected delayed Swallow;Multiple swallows;Wet Vocal Quality;Cough - Immediate    Nectar Thick Nectar Thick Liquid: Not tested   Honey Thick Honey Thick Liquid: Not tested   Puree Puree: Within functional limits Presentation: Spoon   Solid     Solid: Not tested     Herbie Baltimore, MA CCC-SLP  Acute Rehabilitation Services Pager [phone] Office [phone]  Lynann Beaver 11/19/2018,3:01 PM

## 2018-11-19 NOTE — Progress Notes (Signed)
NAME:  Stephen Grimes, MRN:  [id number], DOB:  01-26-1951, LOS: 3 ADMISSION DATE:  11/16/2018, CONSULTATION DATE: 4/26  REFERRING MD:  EDP PA Joy , CHIEF COMPLAINT:  AMS/Resp Failure   Brief History   68 yo male with metastatic renal carcinoma , alcoholic cirrhosis reported altered at home, EMS transported to ER altered with vomiting and elevated b/p . Intubated in ER . CT Head neg for acute . COVID ruled out . Ammonia elevated. EKG with ST elevation   History of present illness   68 yo male with metastatic renal carcinoma , alcoholic cirrhosis altered at home . EMS transported to ER  Limited history concerning presentation. No family in ER .  Marland Kitchen Lives with wife at home that has dementia.   On arrival to ER with worsening mental function , vomiting and elevated b/p . Was intubated in ER for airway protection .  ER workup with CT head neg for acute process. COVID ruled out. Ammonia elevated. Chest xray clear . Afebrile and normal wbc. Bigemny and ectopy increased in ER . Repeat EKG with significant ST elevation . Cardiology consulted. Remained unresponsive off sedation . On exam no reflexes /gag . Neuro consulted. Scr high on arrival at 3.8.  Patient did require Labetolol for frequent ectopy . He was started on Heparin for presumed acute MI . K+ low in ER . K+ replaced.   Past Medical History  Metastatic renal CA  HTN  Hypothyroid   Significant Hospital Events     Consults:  4/26 Neuo  4.26 Cards  4/28 Palliative Care Medicine  Procedures:  4/26 ETT >>   Significant Diagnostic Tests:  4/26 CT Head >no acute  4/26 MRI >> 4/27 EEG> moderated global encephalopathy, no epileptiform activity  4/27 ECHO> LVEF 20%. Akinesis of mid to apical LV 4/28 CXR> no acute cardiopulmonary abnormality  4/29 CXR> no acute cardiopulmonary abnormality  Micro Data:  4/28 MRSA> neg 4/26 UCx> no growth  4/26 BCx> no growth 4/26 SARS CoV-2> not detected  Antimicrobials:  Rifaximin 4/26>>>     Interim history/subjective:  No acute events overnight Remains intubated, off pressors    Objective   Blood pressure 109/70, pulse 68, temperature 99 F (37.2 C), resp. rate 16, height 5\' 7"  (1.702 m), weight 57.6 kg, SpO2 100 %.    Vent Mode: PRVC FiO2 (%):  [30 %] 30 % Set Rate:  [16 bmp] 16 bmp Vt Set:  [520 mL] 520 mL PEEP:  [5 cmH20] 5 cmH20 Pressure Support:  [5 cmH20] 5 cmH20 Plateau Pressure:  [11 cmH20-12 cmH20] 12 cmH20   Intake/Output Summary (Last 24 hours) at 11/19/2018 0743 Last data filed at 11/19/2018 0600 Gross per 24 hour  Intake 4152.61 ml  Output 1560 ml  Net 2592.61 ml   Filed Weights   11/17/18 0600 11/18/18 0500 11/19/18 0500  Weight: 53.4 kg 51.2 kg 57.6 kg    Examination:  General: Elderly cachetic male, intubated, NAD  HENT: NCAT ETT OGT secure. Pink mmm. Trachea midline. Temporal muscle wasting.  Lungs: CTA Bilaterally. No accessory muscle recruitment on PSV/CPAP. Symmetrical chest expansion Cardiovascular: RRR distant heart sounds. No JVD. No lower extremity edema. 1+ radial pulses  Abdomen: Soft, flat, ndnt, hyperactive x4  Extremities: Symmetrical muscle wasting. No obvious joint deformity. No cyanosis or clubbing.  Neuro: Awake, drowsy. Following commands. Gag/cough intact.  Skin: Clean, dry, warm without rash   Resolved Hospital Problem list     Assessment & Plan:   Acute respiratory  failure requiring mechanical ventilation  -inability to protect airway related to acute encephalopathy  COVID-19 negative 4/26 CXR 4/29 no acute abnormality  Vent wean 4/29: tolerating PSV/CPAP 5/5 without increased work or breathing or signs of fatigue  P  Continue wean as able. Mental status has been barrier to extubation however given improvement today may be candidate for extubation If unable to extubate, continue vent support qAM WUA/SBT, PSV/CPAP as tolerated   Continue VAP bundle  RASS 0 to -1  Minimize sedation   Acute on Chronic Kidney  disease, improving AKI  Acute kidney injury may be related to hypovolemia in setting of emesis, diarrhea Acute compensated metabolic acidosis likely related to diarrhea Metastatic renal cell carcinoma  Followed by Lorre Munroe NP, [facility]  Per [place], no further treatment options for onc management  P Continue to trend BMP Continue bicarb gtt Changing IVF to D5 1/2 today given hypernatremia   Electrolyte abnormalities Hypokalemia Hypernatremia, likely iatrogenic  P Trend BMP mag phos K+ goal >4  D5 1/2 for hypernatremia  Acute encephalopathy  EtOH abuse, cirrhosis, hepatic failure  EEG 4/27 consistent with moderate encephalopathy, no epileptiform activity CT head 4/26 neg for acute , ammonia elevated  Etiology of encephalopathy likely hepatic, toxic metabolic  P Neuro following peripherally  Anticipate impaired metabolism/excretion of sedating medications due to underlying kidney/liver dysfunction  Minimize sedation, PRN fent and versed Continue lactulose, trend ammonia  Continue Rifaximin  Acute systolic heart failure with EF 20%  Abnormal EKG with diffuse ST segment elevation  -takotsubo cardiomyopathy vs large LAD infarct  -Formal ECHO 4/27 with LVEF 20%, mid to apical LV segment akinesis  -elevated troponin peaking at 16.76 HTN Home meds: norvasc, losartan  P Cardiology consulted appreciate recs Not candidate for invasive cardiac procedures Consider statin Continue ASA, plavix S/p Heparin gtt  Metop 12.5mg  BID given no signs of cardiogenic shock. If hypotensive, will dc  K+ goal > 4 to prevent dysrhythmia   Anemia -likely component of chronic anemia due to chronic illness   P  CBC PRN  Transfuse per unit protocol   Hypothyroidism  Likely non-compliant with home synthroid (172mcg)  TSH 128 P Continue 46mcg for now, with ability to increase to 75, 182mcg if needed.   Goals of Care: Due to severity of acute illness and chronic illness, patient  overall prognosis likely poor. Palliative Care Medicine has been consulted and we appreciate their assistance. -multiorgan system failure: chronic hepatic failure, renal failure/metastatic renal carcinoma (per discussion with oncology provider 4/28 patient does not have further onc treatment options and palliation is recommended), new acute systolic heart failure, and acute encephalopathy  At this time, patient remains full code.   Best practice:  Diet: NPO  Pain/Anxiety/Delirium protocol (if indicated): PRN vent/versed  VAP protocol (if indicated): continue  DVT prophylaxis: heparin gtt   GI prophylaxis: Protonix  Glucose control: monitor  Mobility: BR  Code Status: Full  Family Communication: Spoke with wife at length 4/28, Will speak with wife again today. PCM also involved with conversations with wife regarding goals of care  Disposition: ICU   Labs   CBC: Recent Labs  Lab 11/16/18 1241  11/16/18 1618 11/17/18 0713 11/18/18 0327 11/18/18 0804 11/19/18 0311  WBC 5.7  --   --  7.5 6.4  --  6.0  NEUTROABS 3.5  --   --   --   --   --   --   HGB 13.0   < > 12.9* 11.8* 10.5* 9.2*  9.6*  HCT 39.6   < > 38.0* 35.1* 32.4* 27.0* 28.6*  MCV 88.6  --   --  87.5 89.8  --  88.3  PLT 181  --   --  125* 136*  --  119*   < > = values in this interval not displayed.    Basic Metabolic Panel: Recent Labs  Lab 11/16/18 1241 11/16/18 1246  11/16/18 1843 11/17/18 0114 11/18/18 0327 11/18/18 0804 11/19/18 0311  NA 136  --    < > 141 142 144 147* 150*  K 3.4*  --    < > 4.4 4.0 3.5 3.5 3.3*  CL 112*  --   --  117* 120* 123*  --  121*  CO2 9*  --   --  11* 8* 10*  --  16*  GLUCOSE 96  --   --  150* 87 160*  --  110*  BUN 72*  --   --  61* 60* 49*  --  45*  CREATININE 3.46* 3.80*  --  2.98* 3.00* 2.28*  --  1.72*  CALCIUM 9.9  --   --  8.4* 8.8* 8.9  --  8.5*  MG  --   --   --   --  1.3* 2.1  --   --   PHOS  --   --   --   --  3.9 2.7  --   --    < > = values in this interval not  displayed.   GFR: Estimated Creatinine Clearance: 33 mL/min (A) (by C-G formula based on SCr of 1.72 mg/dL (H)). Recent Labs  Lab 11/16/18 1241 11/16/18 1713 11/17/18 0713 11/18/18 0327 11/19/18 0311  PROCALCITON  --  <0.10  --   --   --   WBC 5.7  --  7.5 6.4 6.0  LATICACIDVEN  --  3.7*  --   --   --     Liver Function Tests: Recent Labs  Lab 11/16/18 1241 11/17/18 0114 11/18/18 0327 11/19/18 0311  AST 51* 94* 78* 101*  ALT 33 30 28 40  ALKPHOS 155* 121 118 140*  BILITOT 1.1 1.3* 0.9 0.7  PROT 8.7* 7.1 6.2* 5.6*  ALBUMIN 4.3 3.3* 2.7* 2.4*   No results for input(s): LIPASE, AMYLASE in the last 168 hours. Recent Labs  Lab 11/16/18 1241 11/17/18 0713 11/18/18 0327  AMMONIA 133* 62* 71*    ABG    Component Value Date/Time   PHART 7.353 11/18/2018 0804   PCO2ART 18.8 (LL) 11/18/2018 0804   PO2ART 151.0 (H) 11/18/2018 0804   HCO3 10.4 (L) 11/18/2018 0804   TCO2 11 (L) 11/18/2018 0804   ACIDBASEDEF 13.0 (H) 11/18/2018 0804   O2SAT 99.0 11/18/2018 0804     Coagulation Profile: Recent Labs  Lab 11/16/18 1241 11/17/18 0114  INR 1.2 1.5*    Cardiac Enzymes: Recent Labs  Lab 11/16/18 1713 11/16/18 1843 11/17/18 0103 11/17/18 0937  TROPONINI 8.55* 13.43* 16.76* 15.79*    HbA1C: No results found for: HGBA1C  CBG: Recent Labs  Lab 11/18/18 1155 11/18/18 1552 11/18/18 1959 11/18/18 2356 11/19/18 0409  GLUCAP 110* 92 106* 131* 80    Critical care time: 45 minutes     Eliseo Gum MSN, AGACNP-BC [place] [phone] If no answer, [phone] 11/19/2018, 7:43 AM

## 2018-11-19 NOTE — Progress Notes (Signed)
Daily Progress Note   Patient Name: Stephen Grimes       Date: 11/19/2018 DOB: 07-Jan-1951  Age: 68 y.o. MRN#: [id number] Attending Physician: Laurin Coder, MD Primary Care Physician: Seward Carol, MD Admit Date: 11/16/2018  Reason for Consultation/Follow-up: Establishing goals of care  Subjective: Patient awake, alert, following commands. No s/s of pain or discomfort. Vitals stable. Plan for extubation this AM. Discussed with PCCM.    GOC:  0800: Received call from niece, Raven Goins. She shares that the patient's wife has mild dementia and can be hard to reason with. Family members involved and trying to help her understand severity of diagnoses, interventions, and poor prognosis. Raven shares that they did find Amitai's advance directive and she would like to send over to me for chart. Patient has documented his wishes against life-prolonging interventions with terminal or incurable condition. Reassured Raven that palliative team will continue to support her uncle and family with decision-making.   This afternoon, updated Raven on plan for extubation today. Explained that after her 25 Shirlene's conversation with PCCM NP, decision made for DNR/DNI but also recommendation for further discussion regarding GOC with underlying cancer and now heart failure. Reassured Raven that we will have further discussions with her uncle and family after he is extubated.   Answered questions and concerns. Family has PMT contact information.    Length of Stay: 3  Current Medications: Scheduled Meds:  . aspirin  81 mg Per Tube Daily  . chlorhexidine gluconate (MEDLINE KIT)  15 mL Mouth Rinse BID  . clopidogrel  75 mg Per Tube Daily  . heparin injection (subcutaneous)  5,000 Units Subcutaneous  Q8H  . lactulose  30 g Per Tube BID  . levothyroxine  50 mcg Per Tube Q0600  . mouth rinse  15 mL Mouth Rinse 10 times per day  . metoprolol tartrate  12.5 mg Oral BID  . pantoprazole sodium  40 mg Per Tube QHS  . rifaximin  550 mg Per Tube BID  . thiamine injection  100 mg Intravenous Daily    Continuous Infusions: . dextrose 5 % and 0.45% NaCl 50 mL/hr at 11/19/18 0900  . feeding supplement (VITAL AF 1.2 CAL) Stopped (11/19/18 3785)  . potassium PHOSPHATE IVPB (in mmol)    .  sodium bicarbonate (isotonic) infusion in sterile  water 50 mL/hr at 11/19/18 0900    PRN Meds: acetaminophen, albuterol, fentaNYL (SUBLIMAZE) injection, labetalol, midazolam, ondansetron (ZOFRAN) IV, sennosides  Physical Exam Vitals signs and nursing note reviewed.  Constitutional:      General: He is awake.     Interventions: He is intubated.  HENT:     Head: Normocephalic and atraumatic.  Cardiovascular:     Rate and Rhythm: Normal rate.  Pulmonary:     Effort: No tachypnea, accessory muscle usage or respiratory distress. He is intubated.     Breath sounds: Normal breath sounds.  Skin:    General: Skin is warm and dry.  Neurological:     Mental Status: He is alert.     Comments: Follows commands. Plan for extubation today.            Vital Signs: BP 115/67   Pulse 77   Temp 99.1 F (37.3 C)   Resp 15   Ht '5\' 7"'  (1.702 m)   Wt 57.6 kg   SpO2 100%   BMI 19.89 kg/m  SpO2: SpO2: 100 % O2 Device: O2 Device: Nasal Cannula O2 Flow Rate: O2 Flow Rate (L/min): 4 L/min  Intake/output summary:   Intake/Output Summary (Last 24 hours) at 11/19/2018 1113 Last data filed at 11/19/2018 0935 Gross per 24 hour  Intake 4019.39 ml  Output 1740 ml  Net 2279.39 ml   LBM: Last BM Date: 11/18/18 Baseline Weight: Weight: 56.2 kg Most recent weight: Weight: 57.6 kg       Palliative Assessment/Data: PPS 30%   Flowsheet Rows     Most Recent Value  Intake Tab  Referral Department  Critical care   Unit at Time of Referral  ICU  Palliative Care Primary Diagnosis  -- [metastatic renal carcinoma, acute CHF, MI, respiratory failure on vent]  Palliative Care Type  New Palliative care  Date first seen by Palliative Care  11/18/18  Clinical Assessment  Palliative Performance Scale Score  30%  Psychosocial & Spiritual Assessment  Palliative Care Outcomes  Patient/Family meeting held?  Yes  Who was at the meeting?  wife, sister-in-law, niece  Palliative Care Outcomes  Clarified goals of care, Provided end of life care assistance, Provided psychosocial or spiritual support, ACP counseling assistance      Patient Active Problem List   Diagnosis Date Noted  . Palliative care by specialist   . Goals of care, counseling/discussion   . Encephalopathy   . AKI (acute kidney injury) ([place])   . Acute congestive heart failure ([place])   . Metastatic renal cell carcinoma ([place])   . Acute respiratory failure ([place]) 11/16/2018    Palliative Care Assessment & Plan   Patient Profile: 69 y.o. male  with past medical history of metastatic renal carcinoma, HTN, ETOH cirrhosis, thyroid disease and renal impairment  admitted on 11/16/2018 with nausea/vomiting and altered mental status. ICU admission with acute respiratory failure requiring mechanical ventilation, acute on CKD with baseline metastatic renal cell carcinoma, acute systolic heart failure LVEF 20%, abnormal EKG with diffuse ST segment elevation, and acute encephalopathy with elevated ammonia. Neurology and cardiology following. Patient is not a candidate for invasive cardiac interventions. CT head negative. EEG 4/27 consistent with moderate encephalopathy, no epileptiform activity. Poor prognosis with acute critical condition and chronic illness including metastatic cancer. Palliative medicine consultation for goals of care.   Assessment: Acute respiratory failure Acute on chronic kidney disease, improving AKI Metastatic renal cell carcinoma Acute  encephalopathy Acute systolic heart failure with EF 20% Abnormal  EKG with diffuse ST segment elevation Takotsubo cardiomyopathy vs. LAD infarct Liver cirrhosis/ETOH abuse  Recommendations/Plan:  Plan for extubation today per PCCM. After PCCM NP conversation with family, decision for DNR/DNI code status.   Advance directive copy placed in chart. Patient has documented wishes against life-prolonging interventions with terminal or incurable condition.   PMT to follow and further discuss plan of care with patient/family after successful extubation.    Code Status: DNR   Code Status Orders  (From admission, onward)         Start     Ordered   11/19/18 1012  Do not attempt resuscitation (DNR)  Continuous    Question Answer Comment  In the event of cardiac or respiratory ARREST Do not call a "code blue"   In the event of cardiac or respiratory ARREST Do not perform Intubation, CPR, defibrillation or ACLS   In the event of cardiac or respiratory ARREST Use medication by any route, position, wound care, and other measures to relive pain and suffering. May use oxygen, suction and manual treatment of airway obstruction as needed for comfort.      11/19/18 1012        Code Status History    Date Active Date Inactive Code Status Order ID Comments User Context   11/16/2018 1452 11/19/2018 1012 Full Code [id number]  ParrettFonnie Mu, NP ED       Prognosis:   Poor prognosis  Discharge Planning:  To Be Determined  Care plan was discussed with patient, niece, Shirlee Limerick PCCM NP, RN  Thank you for allowing the Palliative Medicine Team to assist in the care of this patient.   Time In: 0800- 1100- Time Out: 0820 1115 Total Time 35 Prolonged Time Billed no      Greater than 50%  of this time was spent counseling and coordinating care related to the above assessment and plan.  Ihor Dow, FNP-C Palliative Medicine Team  Phone: [phone] Fax: [phone]  Please contact  Palliative Medicine Team phone at [phone] for questions and concerns.

## 2018-11-19 NOTE — Progress Notes (Signed)
PCCM Brief Interval Note  I spoke with the patient's wife, Shirlene, and the patient's sister-in-law this morning to update her on the daily plan for the patient-- Given improvement in mental status and successful SBT, we will plan to extubate this morning.   This conversation follows our prior conversation about the severity of present illness and severity of chronic illness, as well as conversation between [place] and PCM.  Yesterday, Shirlene did not wish to discuss code status. In today's conversation, we discussed both chronic and acute illness, palliative care and Code Status again. After discussion, the decision was made to make the patient DNR/DNI following extubation. I advised the family that Palliative Care Medicine will reach out today for further engagement regarding goals of care with medical problems.   Plan: Extubate  Change code status to DNR/DNI Appreciate PCM involvement    CCT 20 minutes  Eliseo Gum MSN, AGACNP-BC [place] [phone] If no answer, [phone] 11/19/2018, 10:18 AM

## 2018-11-19 NOTE — Procedures (Signed)
Extubation Procedure Note  Patient Details:   Name: Granger Chui DOB: October 16, 1950 MRN: [id number]   Airway Documentation:    Vent end date: 11/19/18 Vent end time: 1040   Evaluation  O2 sats: stable throughout Complications: No apparent complications Patient did tolerate procedure well. Bilateral Breath Sounds: Diminished, Clear   Yes   IS performed post extubation  Kathie Dike 11/19/2018, 10:52 AM

## 2018-11-19 NOTE — Progress Notes (Signed)
Progress Note  Patient Name: Stephen Grimes Date of Encounter: 11/19/2018  [facility] Dr. Candee Furbish, MD  Subjective   More alert today, still on ventilator.  Inpatient Medications    Scheduled Meds: . aspirin  81 mg Per Tube Daily  . chlorhexidine gluconate (MEDLINE KIT)  15 mL Mouth Rinse BID  . clopidogrel  75 mg Per Tube Daily  . lactulose  30 g Per Tube BID  . levothyroxine  50 mcg Per Tube Q0600  . mouth rinse  15 mL Mouth Rinse 10 times per day  . metoprolol tartrate  12.5 mg Oral BID  . pantoprazole (PROTONIX) IV  40 mg Intravenous QHS  . potassium chloride  40 mEq Per Tube BID  . rifaximin  550 mg Per Tube BID  . thiamine injection  100 mg Intravenous Daily   Continuous Infusions: . dextrose 5 % and 0.45% NaCl    . feeding supplement (VITAL AF 1.2 CAL) 1,000 mL (11/19/18 0527)  .  sodium bicarbonate (isotonic) infusion in sterile water 75 mL/hr at 11/19/18 0600   PRN Meds: acetaminophen, albuterol, fentaNYL (SUBLIMAZE) injection, labetalol, midazolam, ondansetron (ZOFRAN) IV, sennosides   Vital Signs    Vitals:   11/19/18 0500 11/19/18 0600 11/19/18 0700 11/19/18 0807  BP: 108/63 116/70 109/70 114/65  Pulse: 68 67 68 69  Resp: '15 17 16 16  ' Temp: 99 F (37.2 C) 99 F (37.2 C) 99 F (37.2 C) 99.1 F (37.3 C)  TempSrc:      SpO2: 100% 100% 100% 100%  Weight: 57.6 kg     Height:        Intake/Output Summary (Last 24 hours) at 11/19/2018 0818 Last data filed at 11/19/2018 0751 Gross per 24 hour  Intake 3916.19 ml  Output 1495 ml  Net 2421.19 ml   Filed Weights   11/17/18 0600 11/18/18 0500 11/19/18 0500  Weight: 53.4 kg 51.2 kg 57.6 kg    Physical Exam   GEN: Thin, ill-appearing on ventilator HEENT: ET tube in place  Neck: no JVD Cardiac: no edema  Respiratory: Rise and fall of chest noted  GI: Prior surgical scar noted MS: no deformity or atrophy  Skin:  no rash Neuro: More alert today, noted some  purposeful movements  psych: Unable.  Mittens in place.  Labs    Chemistry Recent Labs  Lab 11/17/18 0114 11/18/18 0327 11/18/18 0804 11/19/18 0311  NA 142 144 147* 150*  K 4.0 3.5 3.5 3.3*  CL 120* 123*  --  121*  CO2 8* 10*  --  16*  GLUCOSE 87 160*  --  110*  BUN 60* 49*  --  45*  CREATININE 3.00* 2.28*  --  1.72*  CALCIUM 8.8* 8.9  --  8.5*  PROT 7.1 6.2*  --  5.6*  ALBUMIN 3.3* 2.7*  --  2.4*  AST 94* 78*  --  101*  ALT 30 28  --  40  ALKPHOS 121 118  --  140*  BILITOT 1.3* 0.9  --  0.7  GFRNONAA 20* 28*  --  40*  GFRAA 23* 33*  --  46*  ANIONGAP 14 11  --  13     Hematology Recent Labs  Lab 11/17/18 0713 11/18/18 0327 11/18/18 0804 11/19/18 0311  WBC 7.5 6.4  --  6.0  RBC 4.01* 3.61*  --  3.24*  HGB 11.8* 10.5* 9.2* 9.6*  HCT 35.1* 32.4* 27.0* 28.6*  MCV 87.5 89.8  --  88.3  MCH 29.4 29.1  --  29.6  MCHC 33.6 32.4  --  33.6  RDW 22.2* 22.9*  --  23.2*  PLT 125* 136*  --  119*    Cardiac Enzymes Recent Labs  Lab 2018/12/15 1713 12-15-18 1843 11/17/18 0103 11/17/18 0937  TROPONINI 8.55* 13.43* 16.76* 15.79*   No results for input(s): TROPIPOC in the last 168 hours.   BNPNo results for input(s): BNP, PROBNP in the last 168 hours.   DDimer No results for input(s): DDIMER in the last 168 hours.   Radiology    Dg Chest Port 1 View  Result Date: 11/19/2018 CLINICAL DATA:  Respiratory failure. EXAM: PORTABLE CHEST 1 VIEW COMPARISON:  Radiograph of November 18, 2018. FINDINGS: The heart size and mediastinal contours are within normal limits. No pneumothorax or pleural effusion is noted. Endotracheal and nasogastric tubes are unchanged in position. Both lungs are clear. The visualized skeletal structures are unremarkable. IMPRESSION: Stable support apparatus. No acute cardiopulmonary abnormality seen. Electronically Signed   By: Marijo Conception M.D.   On: 11/19/2018 07:15   Dg Chest Port 1 View  Result Date: 11/18/2018 CLINICAL DATA:  Acute respiratory  failure. EXAM: PORTABLE CHEST 1 VIEW COMPARISON:  Radiograph of November 17, 2018. FINDINGS: The heart size and mediastinal contours are within normal limits. Endotracheal nasogastric tubes are unchanged in position. No pneumothorax or pleural effusion is noted. Both lungs are clear. The visualized skeletal structures are unremarkable. IMPRESSION: Stable support apparatus. No acute cardiopulmonary abnormality seen. Electronically Signed   By: Marijo Conception M.D.   On: 11/18/2018 07:28    Telemetry    Normal sinus rhythm, no ventricular tachycardia- Personally Reviewed  ECG    No new tracing as of 11/19/2018- Personally Reviewed  Cardiac Studies   Bedside echocardiogram 12/15/18: -Severely reduced LV function at 15 to 20%with basilar sparing.  11/17/18 ECHO: 1. The left ventricle has a visually estimated ejection fraction of 20%. The cavity size was normal. Left ventricular diastolic Doppler parameters are consistent with impaired relaxation. Akinesis of the mid to apical LV segments with normal function of the basilar LV segments. This is consistent with stress (Takotsubo) cardiomyopathy versus infarction in territory of large wrap-around LAD. 2. The right ventricle has normal systolic function. The cavity was normal. There is no increase in right ventricular wall thickness. 3. The aortic valve is tricuspid. No stenosis of the aortic valve. 4. The aortic root is normal in size and structure. 5. The inferior vena cava was normal in size with <50% respiratory variability. PA systolic pressure 26 mmHg. 6. No evidence of mitral valve stenosis. No significant regurgitation  Patient Profile     68 y.o. male with a hx of metastatic renal cell Ca, alcoholic cirrhosis, anterior myocardial infarction troponin peaked at 16 with new ischemic cardiomyopathy EF 20%   Assessment & Plan    1. Abnormal EKG with diffuse ST segment elevation, acute anterior wall myocardial infarction: -Continue  with medical management, not a candidate for further invasive therapies. -Troponinlevel downtrending 8.55>13.43>16.76 > 15.8 -Echo with LVEF of 20% with akinesis of the mid to apical LV segments with normal function ofthe basilar LV segments>>consistent with stress (Takotsubo) cardiomyopathy versus infarction in territory of large wrap-around LAD>>the latter most likely since there are Q waves noted in V2 through V4 -Continue ASA 81, Plavix and low-dose beta-blocker  -Off Hep gtt, received 48 hours -Palliative care consultation given profound illness with plans for continued full code status at this time  per wife  -Plan at this time for aggressive supportive care and possible palliative care consultation given his overall poor prognosis.   2. Acute respiratory failure: -Continues with ETTperPCCM, on pressure support.  PCO2 17,18 consistent with hyperventilation. -CXR without acute cardiopulmonary abnormality -More responsive to stimulation   3. Chronic kidney disease stage IV: -Creatinine,1.72 today down from 2.28 yesterday>>overall improving  -Baseline appears to be in the 3.0 range with known metastatic renal cell carcinoma -Avoid nephrotoxic medications  4. Renal cell carcinoma, metastatic: -See #3.  Recent worsening noted at [facility] with periaortic lymph nodes  5. Acute encephalopathy: -Patient with history of hepatic cirrhosis secondary to EtOH -Now with hepatic versus toxic metabolic encephalopathy -Elevatedammonia>no statin in this setting  -EEG with diffuse encephalopathy and no seizure activity -Neurology following   Signed, Kathyrn Drown NP-C [place] Pager: [phone] 11/19/2018, 8:18 AM     For questions or updates, please contact   Please consult [email or web address] for contact info under Cardiology/STEMI.  Personally seen and examined. Agree with above.  Acute anterior myocardial infarction Hepatic encephalopathy Metastatic renal cell carcinoma Chronic  kidney disease stage IV Metabolic acidosis  -Continue with aggressive medical management at this time.  Aspirin, Plavix, low-dose metoprolol.  No ventricular tachycardia noted on telemetry.  Cardiomyopathy present but no current hemodynamic issues with cardiogenic shock.  Seems to be improving from encephalopathy standpoint.  Hopefully vent will be able to be weaned further with hopes for home palliative care.  Reviewed notes from palliative care consultation, appreciate their help in speaking with wife and family.  Obviously his prognosis remains poor.  Combination of worsening metastatic renal cell carcinoma now coupled with ischemic cardiomyopathy in the setting of anterior myocardial infarction noteworthy.  I discussed his case with critical care team.  At this time, we will sign off.  Please let us know if we can be of further assistance.  Critical care time 35 minutes spent with patient, discussing with critical care team, data analysis, in this gentleman with multisystem organ failure.  Candee Furbish, MD

## 2018-11-19 NOTE — Progress Notes (Signed)
2000 cbg= 51     1 amp dextrose given.  2030 cbg=87.

## 2018-11-19 NOTE — Progress Notes (Signed)
Assisted tele visit to patient with wife and niece.  Maryelizabeth Rowan, RN

## 2018-11-20 LAB — COMPREHENSIVE METABOLIC PANEL
ALT: 34 U/L (ref 0–44)
AST: 71 U/L — ABNORMAL HIGH (ref 15–41)
Albumin: 2.2 g/dL — ABNORMAL LOW (ref 3.5–5.0)
Alkaline Phosphatase: 121 U/L (ref 38–126)
Anion gap: 9 (ref 5–15)
BUN: 32 mg/dL — ABNORMAL HIGH (ref 8–23)
CO2: 22 mmol/L (ref 22–32)
Calcium: 7.7 mg/dL — ABNORMAL LOW (ref 8.9–10.3)
Chloride: 118 mmol/L — ABNORMAL HIGH (ref 98–111)
Creatinine, Ser: 1.38 mg/dL — ABNORMAL HIGH (ref 0.61–1.24)
GFR calc Af Amer: >60 mL/min (ref >60)
GFR calc non Af Amer: 52 mL/min — ABNORMAL LOW (ref >60)
Glucose, Bld: 102 mg/dL — ABNORMAL HIGH (ref 70–99)
Potassium: 3.3 mmol/L — ABNORMAL LOW (ref 3.5–5.1)
Sodium: 149 mmol/L — ABNORMAL HIGH (ref 135–145)
Total Bilirubin: 0.8 mg/dL (ref 0.3–1.2)
Total Protein: 5.1 g/dL — ABNORMAL LOW (ref 6.5–8.1)

## 2018-11-20 LAB — GLUCOSE, CAPILLARY
Glucose-Capillary: 136 mg/dL — ABNORMAL HIGH (ref 70–99)
Glucose-Capillary: 65 mg/dL — ABNORMAL LOW (ref 70–99)
Glucose-Capillary: 67 mg/dL — ABNORMAL LOW (ref 70–99)
Glucose-Capillary: 72 mg/dL (ref 70–99)
Glucose-Capillary: 79 mg/dL (ref 70–99)
Glucose-Capillary: 81 mg/dL (ref 70–99)
Glucose-Capillary: 85 mg/dL (ref 70–99)
Glucose-Capillary: 94 mg/dL (ref 70–99)

## 2018-11-20 LAB — CBC
HCT: 27.1 % — ABNORMAL LOW (ref 39.0–52.0)
Hemoglobin: 8.9 g/dL — ABNORMAL LOW (ref 13.0–17.0)
MCH: 29.8 pg (ref 26.0–34.0)
MCHC: 32.8 g/dL (ref 30.0–36.0)
MCV: 90.6 fL (ref 80.0–100.0)
Platelets: 111 10*3/uL — ABNORMAL LOW (ref 150–400)
RBC: 2.99 MIL/uL — ABNORMAL LOW (ref 4.22–5.81)
RDW: 24.1 % — ABNORMAL HIGH (ref 11.5–15.5)
WBC: 7.8 10*3/uL (ref 4.0–10.5)
nRBC: 2.4 % — ABNORMAL HIGH (ref 0.0–0.2)

## 2018-11-20 LAB — PHOSPHORUS: Phosphorus: 2.4 mg/dL — ABNORMAL LOW (ref 2.5–4.6)

## 2018-11-20 LAB — MAGNESIUM: Magnesium: 1.6 mg/dL — ABNORMAL LOW (ref 1.7–2.4)

## 2018-11-20 LAB — AMMONIA: Ammonia: 33 umol/L (ref 9–35)

## 2018-11-20 MED ORDER — POTASSIUM CHLORIDE 20 MEQ/15ML (10%) PO SOLN
40.0000 meq | Freq: Two times a day (BID) | ORAL | Status: AC
  Administered 2018-11-20 (×2): 40 meq via ORAL
  Filled 2018-11-20 (×2): qty 30

## 2018-11-20 MED ORDER — ADULT MULTIVITAMIN W/MINERALS CH
1.0000 | ORAL_TABLET | Freq: Every day | ORAL | Status: DC
  Administered 2018-11-20 – 2018-11-23 (×4): 1 via ORAL
  Filled 2018-11-20 (×5): qty 1

## 2018-11-20 MED ORDER — ENSURE ENLIVE PO LIQD
237.0000 mL | Freq: Three times a day (TID) | ORAL | Status: DC
  Administered 2018-11-20 – 2018-11-23 (×9): 237 mL via ORAL
  Filled 2018-11-20: qty 237

## 2018-11-20 MED ORDER — DEXTROSE 50 % IV SOLN
INTRAVENOUS | Status: AC
  Administered 2018-11-20: 50 mL
  Filled 2018-11-20: qty 50

## 2018-11-20 MED ORDER — MAGNESIUM SULFATE 2 GM/50ML IV SOLN
2.0000 g | Freq: Once | INTRAVENOUS | Status: AC
  Administered 2018-11-20: 2 g via INTRAVENOUS
  Filled 2018-11-20: qty 50

## 2018-11-20 NOTE — Progress Notes (Signed)
CBG 67, 8 oz cup of orange juice with pack of sugar, recheck 30 minutes later, CBG 81. Giving one more cup of orange juice.   Dewaine Oats, RN

## 2018-11-20 NOTE — Evaluation (Signed)
Occupational Therapy Evaluation Patient Details Name: Stephen Grimes MRN: [id number] DOB: [birth year] Today's Date: 11/20/2018    History of Present Illness Pt found unresponsive and adm with respiratory failure and intubated in ED on 4/26. Pt found to have acute anterior MI. Pt extubated 4/29. Pt with acute encephalopathy.  PMH - metastatic renal cell CA with no further treatment options, alcoholic cirrhosis, HTN   Clinical Impression   Pt PTA: described from pt as independent with [facility] for mobility and pt was his wife's caregiver at home. Pt reports independence with ADL and mobility. Pt currently, performing ADL functional mobility in room and hallway with RW with minA +2 for stability and managing lines. UB ADL is set-upA; LB ADL is ModA. Pt SpO2 requiring 4 L O2 to remain >90% w/ activity. Pt would greatly benefit from continued OT skilled services for ADL, mobility and safety in SNF setting. If family able to assist with pt and spouse for 24/7, pt could go home with [facility] therapies. OT to follow acutely.  BP 103/63 after activity.      Follow Up Recommendations  SNF;Supervision/Assistance - 24 hour(if progresses 24/7 with HH therapies)    Equipment Recommendations  None recommended by OT    Recommendations for Other Services       Precautions / Restrictions Precautions Precautions: Fall Restrictions Weight Bearing Restrictions: No      Mobility Bed Mobility Overal bed mobility: Needs Assistance Bed Mobility: Supine to Sit     Supine to sit: Min assist;HOB elevated     General bed mobility comments: Assist to elevate trunk into sitting and bring hips to EOB  Transfers Overall transfer level: Needs assistance Equipment used: Rolling walker (2 wheeled) Transfers: Sit to/from Stand Sit to Stand: Min assist;+2 safety/equipment         General transfer comment: Assist to bring hips up and for balance    Balance Overall balance assessment: Needs  assistance Sitting-balance support: No upper extremity supported;Feet supported Sitting balance-Leahy Scale: Fair     Standing balance support: No upper extremity supported Standing balance-Leahy Scale: Fair Standing balance comment: Cues for direction, slow gait                           ADL either performed or assessed with clinical judgement   ADL Overall ADL's : Needs assistance/impaired Eating/Feeding: Set up;Sitting   Grooming: Set up;Sitting   Upper Body Bathing: Set up;Sitting   Lower Body Bathing: Moderate assistance;Sitting/lateral leans;Sit to/from stand   Upper Body Dressing : Set up;Sitting   Lower Body Dressing: Moderate assistance;Sitting/lateral leans;Sit to/from stand   Toilet Transfer: Minimal assistance;Ambulation;BSC   Toileting- Clothing Manipulation and Hygiene: Minimal assistance;Cueing for safety;Cueing for sequencing;Sitting/lateral lean;Sit to/from stand       Functional mobility during ADLs: Minimal assistance;Cueing for safety;Cueing for sequencing;Rolling walker General ADL Comments: Pt set-upA for UB and modA for LB As bed positioning is awkward forpt and unable to perform with figure four technique. Pt attempting to bend over in sitting, but unable to don socks as pt cutting off air supply.     Vision Baseline Vision/History: No visual deficits Vision Assessment?: No apparent visual deficits     Perception     Praxis      Pertinent Vitals/Pain Pain Assessment: No/denies pain     Hand Dominance     Extremity/Trunk Assessment Upper Extremity Assessment Upper Extremity Assessment: Generalized weakness   Lower Extremity Assessment Lower Extremity Assessment: Generalized weakness  Cervical / Trunk Assessment Cervical / Trunk Assessment: Normal   Communication Communication Communication: No difficulties   Cognition Arousal/Alertness: Awake/alert Behavior During Therapy: WFL for tasks assessed/performed Overall  Cognitive Status: Impaired/Different from baseline Area of Impairment: Orientation;Memory;Safety/judgement;Problem solving                 Orientation Level: Disoriented to;Time   Memory: Decreased short-term memory   Safety/Judgement: Decreased awareness of deficits   Problem Solving: Slow processing;Decreased initiation;Requires tactile cues General Comments: Pt with delayed responses to orientation questions   General Comments  103/63  BP after activity, no dizziness with positional change noted.    Exercises     Shoulder Instructions      Home Living Family/patient expects to be discharged to:: Private residence Living Arrangements: Spouse/significant other Available Help at Discharge: Family;Available PRN/intermittently(wife with dementia) Type of Home: House Home Access: Stairs to enter [employer] of Steps: 5 Entrance Stairs-Rails: Left Home Layout: One level     Bathroom Shower/Tub: Teacher, early years/pre: Handicapped height     Home Equipment: Cane - single point;Transport chair;Grab bars - tub/shower   Additional Comments: Info received from pt. appears to be oriented      Prior Functioning/Environment Level of Independence: Independent with assistive device(s)        Comments: reports independence with ADL and SPC for mobility        OT Problem List: Decreased strength;Decreased activity tolerance;Impaired balance (sitting and/or standing);Decreased coordination;Decreased safety awareness      OT Treatment/Interventions: Self-care/ADL training;Therapeutic exercise;Neuromuscular education;Energy conservation;Therapeutic activities;Patient/family education;Balance training    OT Goals(Current goals can be found in the care plan section) Acute Rehab OT Goals Patient Stated Goal: return home OT Goal Formulation: With patient Time For Goal Achievement: 12/04/18 Potential to Achieve Goals: Good  OT Frequency: Min 3X/week    Barriers to D/C: Decreased caregiver support  Pt is spouse's main CG       Co-evaluation PT/OT/SLP Co-Evaluation/Treatment: Yes Reason for Co-Treatment: Complexity of the patient's impairments (multi-system involvement)   OT goals addressed during session: ADL's and self-care      AM-PAC OT "6 Clicks" Daily Activity     Outcome Measure Help from another person eating meals?: None Help from another person taking care of personal grooming?: A Little Help from another person toileting, which includes using toliet, bedpan, or urinal?: A Little Help from another person bathing (including washing, rinsing, drying)?: A Lot Help from another person to put on and taking off regular upper body clothing?: A Little Help from another person to put on and taking off regular lower body clothing?: A Lot 6 Click Score: 17   End of Session Equipment Utilized During Treatment: Gait belt;Rolling walker Nurse Communication: Mobility status  Activity Tolerance: Patient tolerated treatment well Patient left: in chair;with call bell/phone within reach;with chair alarm set  OT Visit Diagnosis: Unsteadiness on feet (R26.81);Muscle weakness (generalized) (M62.81)                Time: 9371-6967 OT Time Calculation (min): 30 min Charges:  OT General Charges $OT Visit: 1 Visit OT Evaluation $OT Eval Moderate Complexity: 1 Mod  Darryl Nestle) Marsa Aris OTR/L Acute Rehabilitation Services Pager: [phone] Office: [phone]   Jenene Slicker Rebeckah Masih 11/20/2018, 4:00 PM

## 2018-11-20 NOTE — Progress Notes (Addendum)
NAME:  Stephen Grimes, MRN:  [id number], DOB:  06/11/1951, LOS: 4 ADMISSION DATE:  11/16/2018, CONSULTATION DATE: 4/26  REFERRING MD:  EDP PA Joy , CHIEF COMPLAINT:  AMS/Resp Failure   Brief History   68 yo male with metastatic renal carcinoma , alcoholic cirrhosis reported altered at home, EMS transported to ER altered with vomiting and elevated b/p . Intubated in ER . CT Head neg for acute . COVID ruled out . Ammonia elevated. EKG with ST elevation   History of present illness   68 yo male with metastatic renal carcinoma , alcoholic cirrhosis altered at home . EMS transported to ER  Limited history concerning presentation. No family in ER . Lives with wife at home that has dementia.   On arrival to ER with worsening mental function , vomiting and elevated b/p . Was intubated in ER for airway protection .  ER workup with CT head neg for acute process. COVID ruled out. Ammonia elevated. Chest xray clear . Afebrile and normal wbc. Bigemny and ectopy increased in ER . Repeat EKG with significant ST elevation . Cardiology consulted. Remained unresponsive off sedation . On exam no reflexes /gag . Neuro consulted. Scr high on arrival at 3.8.  Patient did require Labetolol for frequent ectopy . He was started on Heparin for presumed acute MI . K+ low in ER . K+ replaced.   Past Medical History  Metastatic renal CA  HTN  Hypothyroid   Significant Hospital Events     Consults:  4/26 Neuo  4.26 Cards  4/28 Palliative Care Medicine  Procedures:  4/26 ETT >> 4/29  Significant Diagnostic Tests:  4/26 CT Head >no acute  4/26 MRI >> 4/27 EEG> moderated global encephalopathy, no epileptiform activity  4/27 ECHO> LVEF 20%. Akinesis of mid to apical LV 4/28 CXR> no acute cardiopulmonary abnormality  4/29 CXR> no acute cardiopulmonary abnormality  Micro Data:  4/28 MRSA> neg 4/26 UCx> no growth  4/26 BCx> no growth 4/26 SARS CoV-2> not detected  Antimicrobials:  Rifaximin 4/26>>>     Interim history/subjective:  Extubated to 4 L [place] with sats of 100% States his breathing is good, and that he has minimal secretions Ammonia level 33 Net + 5 L HGB drop of 0.5 grams overnight  Objective   Blood pressure 99/62, pulse 74, temperature (!) 97.5 F (36.4 C), temperature source Oral, resp. rate 14, height 5\' 7"  (1.702 m), weight 57 kg, SpO2 100 %.        Intake/Output Summary (Last 24 hours) at 11/20/2018 0932 Last data filed at 11/20/2018 0300 Gross per 24 hour  Intake 2409.12 ml  Output 905 ml  Net 1504.12 ml   Filed Weights   11/18/18 0500 11/19/18 0500 11/20/18 0500  Weight: 51.2 kg 57.6 kg 57 kg    Examination:  General: Elderly cachetic male, on 4 L [place], weak, but in NAD HEENT: NCAT . Pink mmm. Trachea midline. Temporal muscle wasting.No LAD, weak cough, but protecting airway  Lungs: Bilateral Chest excursion, CTA Bilaterally. No accessory muscle movement noted, diminished per bases  Cardiovascular: S1, S2, RRR  No JVD. No lower extremity edema. 1+ radial pulses, generalized edema  Abdomen: Soft, flat, ndnt, hyperactive x4  Extremities: Symmetrical muscle wasting. No obvious joint deformity. No cyanosis or clubbing. Upper extremity edema/ third spacing  noted Neuro: Awake, Alert . Following commands. Answering questions appropriately Skin: Clean, dry, warm without rash or lesions  Resolved Hospital Problem list     Assessment & Plan:  Acute respiratory failure requiring mechanical ventilation >> resolved -inability to protect airway related to acute encephalopathy  COVID-19 negative 4/26 CXR 4/29 no acute abnormality  Extubated 4/29 to 4 L [place] P  Wean oxygen for sat goal of > 94% Aggressive Pulmonary Toilet IS Q 1 while awake CXR prn OOB to chair PT/OT  Swallow Clinical signs of dysphagia including baseline wet vocal quality and grimacing with swallowing. Trials of thin liquids raise concern for aspiration given multiple swallows with appearance of  poor coordination of swallowing and breathing as well as coughing following large volume intake (3 oz water swallow). Pt did tolerate trials of puree without difficulty. Recommend pt consume meds in puree as needed and ice chips for comfort, will f/u for further trials as pt continues to recover after extubation. Plan: Per  Speech recommendation NPO except meds;Ice chips PRN after oral care   May need Cortrak if unable to manage Dysphagia diet due to malnutrition    Acute on Chronic Kidney disease, improving AKI  Acute kidney injury may be related to hypovolemia in setting of emesis, diarrhea Acute compensated metabolic acidosis likely related to diarrhea Metastatic renal cell carcinoma  Followed by Lorre Munroe NP, [facility]  Per [place], no further treatment options for onc management  Creatinine down trending P Continue to trend BMP Trend UO Will discontinue bicarb gtt Continue  D5 1/2  >>hypernatremia >> Consider D5W  Hypoglycemia overnight P CBG Q 4 Continue D5 1/2 NS at 100 cc/hr Decrease rate once tolerating diet  Electrolyte abnormalities Hypokalemia Hypernatremia, likely iatrogenic Hypo mag  P Trend BMP mag phos K+ goal >4  Maintain renal perfusion Avoid nephrotoxic medications D5 1/2 for hypernatremia Replete electrolytes as needed  Acute encephalopathy  EtOH abuse, cirrhosis, hepatic failure  EEG 4/27 consistent with moderate encephalopathy, no epileptiform activity CT head 4/26 neg for acute , ammonia elevated  Etiology of encephalopathy likely hepatic, toxic metabolic  P Neuro following peripherally  Anticipate impaired metabolism/excretion of sedating medications due to underlying kidney/liver dysfunction  Will discontinue pain medication/ sedation Continue lactulose, trend ammonia  Continue Rifaximin  Acute systolic heart failure with EF 20%  Abnormal EKG with diffuse ST segment elevation  -takotsubo cardiomyopathy vs large LAD infarct  -Formal  ECHO 4/27 with LVEF 20%, mid to apical LV segment akinesis  -elevated troponin peaking at 16.76 HTN Home meds: norvasc, losartan  BP remains soft  P Cardiology consulted appreciate recs Not candidate for invasive cardiac procedures Goal of euvolemia Consider statin Continue ASA, plavix S/p Heparin gtt  Metop 12.5mg  BID given no signs of cardiogenic shock. If hypotensive, will dc  K+ goal > 4 to prevent dysrhythmia  Trend CXR prn  Anemia -likely component of chronic anemia due to chronic illness   Drop of 1/2 gram overnight, no obvious bleeding P  CBC PRN  Transfuse per unit protocol  Monitor for obvious source of bleeding  Hypothyroidism  Likely non-compliant with home synthroid (139mcg)  TSH 128 P Continue 95mcg for now, with ability to increase to 75, 175mcg if needed.   Goals of Care: Due to severity of acute illness and chronic illness, patient overall prognosis likely poor. Palliative Care Medicine has been consulted and we appreciate their assistance. -multiorgan system failure: chronic hepatic failure, renal failure/metastatic renal carcinoma (per discussion with oncology provider 4/28 patient does not have further onc treatment options and palliation is recommended), new acute systolic heart failure, and acute encephalopathy  After phone conversation with Cassandria Santee , NP 4/29,  patient was made DNR/DNI. He has tolerated extubation to 4 L [place] Consider Transfer to Progressive unit  Best practice:  Diet: NPO  Pain/Anxiety/Delirium protocol (if indicated): PRN vent/versed  VAP protocol (if indicated): continue  DVT prophylaxis: heparin gtt   GI prophylaxis: Protonix  Glucose control: monitor  Mobility: BR  Code Status: Full  Family Communication: Spoke with wife at length 4/28, Will speak with wife again today. PCM also involved with conversations with wife regarding goals of care  Disposition: ICU   Labs   CBC: Recent Labs  Lab 11/16/18 1241  11/17/18 0713  11/18/18 0327 11/18/18 0804 11/19/18 0311 11/20/18 0314  WBC 5.7  --  7.5 6.4  --  6.0 7.8  NEUTROABS 3.5  --   --   --   --   --   --   HGB 13.0   < > 11.8* 10.5* 9.2* 9.6* 8.9*  HCT 39.6   < > 35.1* 32.4* 27.0* 28.6* 27.1*  MCV 88.6  --  87.5 89.8  --  88.3 90.6  PLT 181  --  125* 136*  --  119* 111*   < > = values in this interval not displayed.    Basic Metabolic Panel: Recent Labs  Lab 11/16/18 1843 11/17/18 0114 11/18/18 0327 11/18/18 0804 11/19/18 0311 11/19/18 0838 11/20/18 0314  NA 141 142 144 147* 150*  --  149*  K 4.4 4.0 3.5 3.5 3.3*  --  3.3*  CL 117* 120* 123*  --  121*  --  118*  CO2 11* 8* 10*  --  16*  --  22  GLUCOSE 150* 87 160*  --  110*  --  102*  BUN 61* 60* 49*  --  45*  --  32*  CREATININE 2.98* 3.00* 2.28*  --  1.72*  --  1.38*  CALCIUM 8.4* 8.8* 8.9  --  8.5*  --  7.7*  MG  --  1.3* 2.1  --   --  1.9 1.6*  PHOS  --  3.9 2.7  --   --  1.3* 2.4*   GFR: Estimated Creatinine Clearance: 40.7 mL/min (A) (by C-G formula based on SCr of 1.38 mg/dL (H)). Recent Labs  Lab 11/16/18 1713 11/17/18 0713 11/18/18 0327 11/19/18 0311 11/20/18 0314  PROCALCITON <0.10  --   --   --   --   WBC  --  7.5 6.4 6.0 7.8  LATICACIDVEN 3.7*  --   --   --   --     Liver Function Tests: Recent Labs  Lab 11/16/18 1241 11/17/18 0114 11/18/18 0327 11/19/18 0311 11/20/18 0314  AST 51* 94* 78* 101* 71*  ALT 33 30 28 40 34  ALKPHOS 155* 121 118 140* 121  BILITOT 1.1 1.3* 0.9 0.7 0.8  PROT 8.7* 7.1 6.2* 5.6* 5.1*  ALBUMIN 4.3 3.3* 2.7* 2.4* 2.2*   No results for input(s): LIPASE, AMYLASE in the last 168 hours. Recent Labs  Lab 11/16/18 1241 11/17/18 0713 11/18/18 0327 11/19/18 0838 11/20/18 0314  AMMONIA 133* 62* 71* 35 33    ABG    Component Value Date/Time   PHART 7.353 11/18/2018 0804   PCO2ART 18.8 (LL) 11/18/2018 0804   PO2ART 151.0 (H) 11/18/2018 0804   HCO3 10.4 (L) 11/18/2018 0804   TCO2 11 (L) 11/18/2018 0804   ACIDBASEDEF 13.0 (H)  11/18/2018 0804   O2SAT 99.0 11/18/2018 0804     Coagulation Profile: Recent Labs  Lab 11/16/18 1241 11/17/18 0114  INR 1.2 1.5*    Cardiac Enzymes: Recent Labs  Lab 11/16/18 1713 11/16/18 1843 11/17/18 0103 11/17/18 0937  TROPONINI 8.55* 13.43* 16.76* 15.79*    HbA1C: No results found for: HGBA1C  CBG: Recent Labs  Lab 11/19/18 2035 11/20/18 0023 11/20/18 0159 11/20/18 0335 11/20/18 0820  GLUCAP 87 65* 136* 94 79    Critical care time: 45 minutes     Magdalen Spatz,  MSN, AGACNP-BC [place] [phone] If no answer, [phone] 11/20/2018, 9:32 AM

## 2018-11-20 NOTE — Evaluation (Signed)
Physical Therapy Evaluation Patient Details Name: Stephen Grimes MRN: [id number] DOB: [birth year] Today's Date: 11/20/2018   History of Present Illness  Pt found unresponsive and adm with respiratory failure and intubated in ED on 4/26. Pt found to have acute anterior MI. Pt extubated 4/29. Pt with acute encephalopathy.  PMH - metastatic renal cell CA with no further treatment options, alcoholic cirrhosis, HTN  Clinical Impression  Pt admitted with above diagnosis and presents to PT with functional limitations due to deficits listed below (See PT problem list). Pt needs skilled PT to maximize independence and safety to allow discharge to ST-SNF. Pt's wife with dementia and unable to provide care. If other family members willing and able to provide 24 hour assist could look at possibly home.     Follow Up Recommendations SNF(Unless other family able/willing to provide 24 assist)    Equipment Recommendations  Rolling walker with 5" wheels    Recommendations for Other Services       Precautions / Restrictions Precautions Precautions: Fall Restrictions Weight Bearing Restrictions: No      Mobility  Bed Mobility Overal bed mobility: Needs Assistance Bed Mobility: Supine to Sit     Supine to sit: Min assist;HOB elevated     General bed mobility comments: Assist to elevate trunk into sitting and bring hips to EOB  Transfers Overall transfer level: Needs assistance Equipment used: Rolling walker (2 wheeled) Transfers: Sit to/from Stand Sit to Stand: Min assist;+2 safety/equipment         General transfer comment: Assist to bring hips up and for balance  Ambulation/Gait Ambulation/Gait assistance: Min assist Gait Distance (Feet): 100 Feet Assistive device: Rolling walker (2 wheeled) Gait Pattern/deviations: Step-through pattern;Decreased stride length;Drifts right/left Gait velocity: decr Gait velocity interpretation: <1.31 ft/sec, indicative of household  ambulator General Gait Details: Assist for balance and support. Assist to guide walker. Used 4L of O2  Stairs            Wheelchair Mobility    Modified Rankin (Stroke Patients Only)       Balance Overall balance assessment: Needs assistance Sitting-balance support: No upper extremity supported;Feet supported Sitting balance-Leahy Scale: Fair     Standing balance support: No upper extremity supported Standing balance-Leahy Scale: Fair                               Pertinent Vitals/Pain Pain Assessment: No/denies pain    Home Living Family/patient expects to be discharged to:: Private residence Living Arrangements: Spouse/significant other Available Help at Discharge: Family;Available PRN/intermittently(wife with dementia) Type of Home: House Home Access: Stairs to enter Entrance Stairs-Rails: Left Entrance Stairs-Number of Steps: 5 Home Layout: One level Home Equipment: Cane - single point;Transport chair;Grab bars - tub/shower Additional Comments: Info received from pt. appears to be oriented    Prior Function Level of Independence: Independent with assistive device(s)         Comments: reports independence with ADL and SPC for mobility     Hand Dominance        Extremity/Trunk Assessment   Upper Extremity Assessment Upper Extremity Assessment: Defer to OT evaluation    Lower Extremity Assessment Lower Extremity Assessment: Generalized weakness       Communication   Communication: No difficulties  Cognition Arousal/Alertness: Awake/alert Behavior During Therapy: WFL for tasks assessed/performed Overall Cognitive Status: Impaired/Different from baseline Area of Impairment: Orientation;Memory;Safety/judgement;Problem solving  Orientation Level: Disoriented to;Time   Memory: Decreased short-term memory   Safety/Judgement: Decreased awareness of deficits   Problem Solving: Slow processing;Decreased  initiation;Requires tactile cues General Comments: Pt with delayed responses to orientation questions      General Comments      Exercises     Assessment/Plan    PT Assessment Patient needs continued PT services  PT Problem List Decreased strength;Decreased activity tolerance;Decreased balance;Decreased mobility;Decreased knowledge of use of DME;Decreased safety awareness       PT Treatment Interventions DME instruction;Gait training;Stair training;Functional mobility training;Therapeutic activities;Therapeutic exercise;Balance training;Cognitive remediation;Patient/family education    PT Goals (Current goals can be found in the Care Plan section)  Acute Rehab PT Goals Patient Stated Goal: return home PT Goal Formulation: With patient Time For Goal Achievement: 12/04/18 Potential to Achieve Goals: Good    Frequency Min 3X/week   Barriers to discharge Decreased caregiver support;Inaccessible home environment stairs to enter. Wife has dementia    Co-evaluation PT/OT/SLP Co-Evaluation/Treatment: Yes Reason for Co-Treatment: Complexity of the patient's impairments (multi-system involvement);For patient/therapist safety PT goals addressed during session: Mobility/safety with mobility;Balance         AM-PAC PT "6 Clicks" Mobility  Outcome Measure Help needed turning from your back to your side while in a flat bed without using bedrails?: A Little Help needed moving from lying on your back to sitting on the side of a flat bed without using bedrails?: A Little Help needed moving to and from a bed to a chair (including a wheelchair)?: A Little Help needed standing up from a chair using your arms (e.g., wheelchair or bedside chair)?: A Little Help needed to walk in hospital room?: A Little Help needed climbing 3-5 steps with a railing? : A Little 6 Click Score: 18    End of Session Equipment Utilized During Treatment: Gait belt;Oxygen Activity Tolerance: Patient tolerated  treatment well Patient left: in chair;with call bell/phone within reach;with chair alarm set Nurse Communication: Mobility status PT Visit Diagnosis: Unsteadiness on feet (R26.81);Other abnormalities of gait and mobility (R26.89);Muscle weakness (generalized) (M62.81)    Time: 1103-1594 PT Time Calculation (min) (ACUTE ONLY): 32 min   Charges:   PT Evaluation $PT Eval Moderate Complexity: [place] Pager [phone] Office [place] 11/20/2018, 12:53 PM

## 2018-11-20 NOTE — Progress Notes (Signed)
Daily Progress Note   Patient Name: Stephen Grimes       Date: 11/20/2018 DOB: December 22, 1950  Age: 68 y.o. MRN#: [id number] Attending Physician: Laurin Coder, MD Primary Care Physician: Seward Carol, MD Admit Date: 11/16/2018  Reason for Consultation/Follow-up: Establishing goals of care  Subjective: Patient awake, alert, oriented. VSS stable. Respiratory status stable post-extubation. Denies pain or discomfort.   GOC:   Discussed GOC with patient at bedside.    I introduced Palliative Medicine as specialized medical care for people living with serious illness. It focuses on providing relief from the symptoms and stress of a serious illness. The goal is to improve quality of life for both the patient and the family.  The patient tells me he is familiar with palliative and tells me he has been trying to discuss this with his wife, but she always defers the conversation to God being in control.   We discussed a brief life review of the patient. 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 Him and wife have been married since 1996. He shares great support from family.   Discussed events leading up to admission and course of hospitalization including diagnoses and interventions. Explained that his heart will be medically managed and that he is not a candidate for invasive cardiac interventions, which he understands. Further explained the fear that due to weak heart function, he will likely not be a candidate for further chemotherapy. Also reassured him that I would discuss with [facility] Oncology NP this afternoon.   I attempted to elicit values and goals of care important to the patient. Stephen Grimes  does seem to have a good understanding of medical conditions and poor long-term prognosis. He acknowledges that he could "check out of here at any time" and "death is inevitable." He further explains to me that he wishes to "die at home." He also shares that he is a "strong-willed" individual and wishes to attempt physical therapy. He is eager to get out of bed today. He is willing to go to SNF to attempt rehab if necessary but would rather go home with home health physical therapy.   Advanced directives, concepts specific to code status, artifical feeding and hydration, and rehospitalization were considered and discussed. Introduced  MOST form. Explained wife/PCCM decision that was made yesterday prior to extubation for DNR/DNI in the event of cardiac arrest. Educated on medical recommendation for DNR with underlying unfixable conditions. Patient agrees that if the worst of the worst happened, he would not wish for heroic interventions including resuscitation and to be placed back on life support. He plans to further discuss his EOL wishes with wife when he is home. He is interested in completing MOST form when able to further discuss with wife at home.   FaceTimed for patient and wife at bedside.    [id number] Schooner with [facility] Oncology NP, Virgel Bouquet to update on current patient status and recommendations moving forward. Blaine plans to call and update patient/family in the next few days. Per Virgel Bouquet, with current EF, patient is not a candidate to re-start current oncology treatment OR a candidate for other treatment options. Virgel Bouquet agrees with DNR/DNI code status and outpatient palliative referral.   Shortly after, spoke with wife, Shirlene via telephone to update on plan of care and importance of further discussions regarding Stephen Grimes's EOL wishes. Also updated Shirlene on my conversation with her husband regarding his wishes to die at home and decision for DNR/DNI code status. Shirlene is agreeable for  outpatient palliative referral. Explained that Hard Choices booklet and MOST form will be placed in patient belonging bag for him.   Also updated niece, Raven Goins via telephone. She plans to further discuss plan of care and importance of future discussions regarding EOL wishes with Shirlene.    Length of Stay: 4  Current Medications: Scheduled Meds:  . aspirin  81 mg Per Tube Daily  . clopidogrel  75 mg Per Tube Daily  . heparin injection (subcutaneous)  5,000 Units Subcutaneous Q8H  . lactulose  30 g Per Tube BID  . levothyroxine  50 mcg Per Tube Q0600  . mouth rinse  15 mL Mouth Rinse BID  . metoprolol tartrate  12.5 mg Oral BID  . pantoprazole sodium  40 mg Per Tube QHS  . potassium chloride  40 mEq Oral BID  . rifaximin  550 mg Per Tube BID  . thiamine injection  100 mg Intravenous Daily    Continuous Infusions: . dextrose 5 % and 0.45% NaCl 100 mL/hr at 11/20/18 0300  . feeding supplement (VITAL AF 1.2 CAL) Stopped (11/19/18 4401)  . magnesium sulfate 1 - 4 g bolus IVPB 2 g (11/20/18 1111)    PRN Meds: acetaminophen, albuterol, fentaNYL (SUBLIMAZE) injection, labetalol, ondansetron (ZOFRAN) IV, sennosides  Physical Exam Vitals signs and nursing note reviewed.  Constitutional:      General: He is awake.     Interventions: He is intubated.  HENT:     Head: Normocephalic and atraumatic.  Cardiovascular:     Rate and Rhythm: Normal rate.  Pulmonary:     Effort: No tachypnea, accessory muscle usage or respiratory distress. He is intubated.     Breath sounds: Normal breath sounds.  Skin:    General: Skin is warm and dry.  Neurological:     Mental Status: He is alert.     Comments: Follows commands. Plan for extubation today.            Vital Signs: BP 99/62   Pulse 74   Temp (!) 97.5 F (36.4 C) (Oral)   Resp 14   Ht 5\' 7"  (1.702 m)   Wt 57 kg   SpO2 100%   BMI 19.68 kg/m  SpO2: SpO2: 100 % O2 Device: O2  Device: Nasal Cannula O2 Flow Rate: O2 Flow Rate  (L/min): 4 L/min  Intake/output summary:   Intake/Output Summary (Last 24 hours) at 11/20/2018 1130 Last data filed at 11/20/2018 0300 Gross per 24 hour  Intake 2209.44 ml  Output 660 ml  Net 1549.44 ml   LBM: Last BM Date: 11/20/18 Baseline Weight: Weight: 56.2 kg Most recent weight: Weight: 57 kg       Palliative Assessment/Data: PPS 30%   Flowsheet Rows     Most Recent Value  Intake Tab  Referral Department  Critical care  Unit at Time of Referral  ICU  Palliative Care Primary Diagnosis  -- [metastatic renal carcinoma, acute CHF, MI, respiratory failure on vent]  Date Notified  11/17/18  Palliative Care Type  New Palliative care  Reason for referral  Clarify Goals of Care  Date of Admission  11/16/18  Date first seen by Palliative Care  11/18/18  # of days Palliative referral response time  1 Day(s)  # of days IP prior to Palliative referral  1  Clinical Assessment  Palliative Performance Scale Score  30%  Psychosocial & Spiritual Assessment  Palliative Care Outcomes  Patient/Family meeting held?  Yes  Who was at the meeting?  wife, sister-in-law, niece  Palliative Care Outcomes  Clarified goals of care, Provided end of life care assistance, Provided psychosocial or spiritual support, ACP counseling assistance      Patient Active Problem List   Diagnosis Date Noted  . Palliative care by specialist   . Goals of care, counseling/discussion   . Encephalopathy   . AKI (acute kidney injury) ([place])   . Acute congestive heart failure ([place])   . Metastatic renal cell carcinoma ([place])   . Acute respiratory failure ([place]) 11/16/2018    Palliative Care Assessment & Plan   Patient Profile: 68 y.o. male  with past medical history of metastatic renal carcinoma, HTN, ETOH cirrhosis, thyroid disease and renal impairment  admitted on 11/16/2018 with nausea/vomiting and altered mental status. ICU admission with acute respiratory failure requiring mechanical ventilation, acute on  CKD with baseline metastatic renal cell carcinoma, acute systolic heart failure LVEF 20%, abnormal EKG with diffuse ST segment elevation, and acute encephalopathy with elevated ammonia. Neurology and cardiology following. Patient is not a candidate for invasive cardiac interventions. CT head negative. EEG 4/27 consistent with moderate encephalopathy, no epileptiform activity. Poor prognosis with acute critical condition and chronic illness including metastatic cancer. Palliative medicine consultation for goals of care.   Assessment: Acute respiratory failure Acute on chronic kidney disease, improving AKI Metastatic renal cell carcinoma Acute encephalopathy Acute systolic heart failure with EF 20% Abnormal EKG with diffuse ST segment elevation Takotsubo cardiomyopathy vs. LAD infarct Liver cirrhosis/ETOH abuse  Recommendations/Plan:  DNR/DNI, otherwise continue current plan of care and medical management.   Spoke with [facility] Oncology NP. With current EF, patient is not a candidate to restart current oncology therapy or a candidate for further oncology interventions. Onc NP agrees with DNR/DNI and outpatient palliative referral. PMT NP relayed this to patient/family but Oncology NP also plans to discuss with patient/family.   Continue PT/SLP evaluations. Patient wishes to attempt physical therapy. He is willing to go to SNF rehab if necessary but prefers to return home with home health PT. Family to further discuss and make decision.   Patient/family agreeable with outpatient palliative referral.   Patient does acknowledge progressive conditions and poor long-term prognosis. He shares his wishes to die at home. Encouraged patient and family to continue  discussions in order to honor his EOL wishes. Also encouraged completion of MOST form with outpatient palliative provider.    Code Status: DNR   Code Status Orders  (From admission, onward)         Start     Ordered   11/19/18 1012  Do not  attempt resuscitation (DNR)  Continuous    Question Answer Comment  In the event of cardiac or respiratory ARREST Do not call a "code blue"   In the event of cardiac or respiratory ARREST Do not perform Intubation, CPR, defibrillation or ACLS   In the event of cardiac or respiratory ARREST Use medication by any route, position, wound care, and other measures to relive pain and suffering. May use oxygen, suction and manual treatment of airway obstruction as needed for comfort.      11/19/18 1012        Code Status History    Date Active Date Inactive Code Status Order ID Comments User Context   11/16/2018 1452 11/19/2018 1012 Full Code [id number]  Parrett, Fonnie Mu, NP ED       Prognosis:   Poor long-term prognosis  Discharge Planning:  To Be Determined SNF for rehab vs. Home with home health  Care plan was discussed with RN, PT, patient, wife, neice  Thank you for allowing the Palliative Medicine Team to assist in the care of this patient.   Time In: 1030- 1230- Time Out: 1100 1400 Total Time 120 Prolonged Time Billed yes      Greater than 50%  of this time was spent counseling and coordinating care related to the above assessment and plan.  Ihor Dow, FNP-C Palliative Medicine Team  Phone: [phone] Fax: [phone]  Please contact Palliative Medicine Team phone at [phone] for questions and concerns.

## 2018-11-20 NOTE — Progress Notes (Signed)
  Speech Language Pathology Treatment: Dysphagia  Patient Details Name: Stephen Grimes MRN: [id number] DOB: 03/20/1951 Today's Date: 11/20/2018 Time: 3825-0539 SLP Time Calculation (min) (ACUTE ONLY): 16 min  Assessment / Plan / Recommendation Clinical Impression  Pt demonstrates improving mentation, awareness, ability to self feed and improved coordination of breathing and swallowing subjectively this session. Vocal quality appears clearer, pt reports throat pain has resolved. Observed pt consume over 16 oz of liquids throughout session with a straw, meds in puree and mastication of graham. There is still slight intermittent wet vocal quality and throat clearing with sips of thin liquids, not worsened with increased volume or consecutive swallows. No coughing observed throughout session. There may be a mild persistent dysphagia, acute or possibly chronic. Will allow advancement to mechanical soft and thin liquids with close supervision. Can proceed with objective testing if pt demonstrates poor tolerance and plan of care warrants it. Initiating dys 3 (mech soft) and thin liquids.     HPI HPI: 68 yo male with metastatic renal carcinoma , alcoholic cirrhosis reported altered at home, EMS transported to ER altered with vomiting and elevated b/p . Intubated in ER . CT Head neg for acute . COVID ruled out . Ammonia elevated. EKG with ST elevation. Intubated 4/26-4/29.       SLP Plan  Continue with current plan of care       Recommendations  Medication Administration: Whole meds with puree Supervision: Full supervision/cueing for compensatory strategies                Oral Care Recommendations: Oral care QID Follow up Recommendations: Skilled Nursing facility SLP Visit Diagnosis: Dysphagia, oropharyngeal phase (R13.12) Plan: Continue with current plan of care       GO               Stephen Baltimore, MA [place] Pager [phone] Office  [phone]  Lynann Beaver 11/20/2018, 11:31 AM

## 2018-11-20 NOTE — Progress Notes (Signed)
eLink Physician-Brief Progress Note Patient Name: Stephen Grimes DOB: [birth year] MRN: [id number]   Date of Service  11/20/2018  HPI/Events of Note  Low normal blood sugar noted by bedside RN  eICU Interventions  Increase in D 5 0.45 % saline infusion from 50 to 100 ml/ hr ordered        Shawntia Mangal U Paizlee Kinder 11/20/2018, 1:03 AM

## 2018-11-20 NOTE — TOC Progression Note (Signed)
Transition of Care [facility]) - Progression Note    Patient Details  Name: Stephen Grimes MRN: [id number] Date of Birth: 1951/02/17  [facility] of [facility]) CM/SW Contact  Michiah Masse, Abelino Derrick, RN Phone Number: 11/20/2018, 2:51 PM  Clinical Narrative: CM acknowledges consult for community palliative program if pt decides to discharge home.  CM will continue to follow and if pt decides to discharge home - palliative referral will be made.  Pt still has intensity for continued stay at this time      Expected Discharge Plan: [place] Barriers to Discharge: Continued Medical Work up  Expected Discharge Plan and Services Expected Discharge Plan: [place] In-house Referral: Clinical Social Work Discharge Planning Services: CM Consult   Living arrangements for the past 2 months: Single Family Home                                       Social Determinants of Health (SDOH) Interventions    Readmission Risk Interventions No flowsheet data found.

## 2018-11-20 NOTE — Progress Notes (Signed)
Nutrition Follow-up  DOCUMENTATION CODES:   Underweight  INTERVENTION:   Ensure Enlive po TID, each supplement provides 350 kcal and 20 grams of protein  Add MVI with Minerals   NUTRITION DIAGNOSIS:   Inadequate oral intake related to inability to eat as evidenced by NPO status.  Being addressed via diet advanced, supplements  GOAL:   Patient will meet greater than or equal to 90% of their needs  Progressing  MONITOR:   Vent status, TF tolerance, Labs, Skin, I & O's  REASON FOR ASSESSMENT:   Ventilator, Consult Enteral/tube feeding initiation and management  ASSESSMENT:   68 yo male with PMH of metastatic renal cell cancer, alcoholic cirrhosis, and HTN who was admitted with AMS at home. COVID negative. Required intubation in the ED.   4/29 Extubated  Diet advanced to Dysphagia III today; no recorded po intake yet.   Current wt 58 kg; admission wt 53.4 kg. Noted pt net positive.    Labs: sodium 149 (H), potassium 3.3 (L), phosphorus 2.4 (L), magnesium 1.6 (L) Meds: D5-1/2 NS at 100 ml/hr, lactulose, thiamine, xifaxan  Diet Order:   Diet Order            DIET DYS 3 Room service appropriate? Yes; Fluid consistency: Thin  Diet effective now              EDUCATION NEEDS:   No education needs have been identified at this time  Skin:  Skin Assessment: Reviewed RN Assessment  Last BM:  4/30 rectal   Height:   Ht Readings from Last 1 Encounters:  11/16/18 5\' 7"  (1.702 m)    Weight:   Wt Readings from Last 1 Encounters:  11/20/18 57 kg    Ideal Body Weight:  67.3 kg  BMI:  Body mass index is 19.68 kg/m.  Estimated Nutritional Needs:   Kcal:  2000-2300 kcals   Protein:  100-115 g  Fluid:  >/= 2 L   Kerman Passey MS, RD, LDN, CNSC [phone] Pager  [phone] Weekend/On-Call Pager

## 2018-11-21 ENCOUNTER — Inpatient Hospital Stay (HOSPITAL_COMMUNITY): Payer: Medicare Other

## 2018-11-21 DIAGNOSIS — C642 Malignant neoplasm of left kidney, except renal pelvis: Secondary | ICD-10-CM

## 2018-11-21 DIAGNOSIS — E039 Hypothyroidism, unspecified: Secondary | ICD-10-CM

## 2018-11-21 LAB — MAGNESIUM: Magnesium: 1.9 mg/dL (ref 1.7–2.4)

## 2018-11-21 LAB — PATHOLOGIST SMEAR REVIEW

## 2018-11-21 LAB — CULTURE, BLOOD (ROUTINE X 2)
Culture: NO GROWTH
Culture: NO GROWTH
Special Requests: ADEQUATE

## 2018-11-21 LAB — CBC
HCT: 25.3 % — ABNORMAL LOW (ref 39.0–52.0)
Hemoglobin: 7.9 g/dL — ABNORMAL LOW (ref 13.0–17.0)
MCH: 28.6 pg (ref 26.0–34.0)
MCHC: 31.2 g/dL (ref 30.0–36.0)
MCV: 91.7 fL (ref 80.0–100.0)
Platelets: 123 10*3/uL — ABNORMAL LOW (ref 150–400)
RBC: 2.76 MIL/uL — ABNORMAL LOW (ref 4.22–5.81)
RDW: 24.3 % — ABNORMAL HIGH (ref 11.5–15.5)
WBC: 6.5 10*3/uL (ref 4.0–10.5)
nRBC: 4.4 % — ABNORMAL HIGH (ref 0.0–0.2)

## 2018-11-21 LAB — BASIC METABOLIC PANEL
Anion gap: 10 (ref 5–15)
BUN: 25 mg/dL — ABNORMAL HIGH (ref 8–23)
CO2: 22 mmol/L (ref 22–32)
Calcium: 8.2 mg/dL — ABNORMAL LOW (ref 8.9–10.3)
Chloride: 108 mmol/L (ref 98–111)
Creatinine, Ser: 1.29 mg/dL — ABNORMAL HIGH (ref 0.61–1.24)
GFR calc Af Amer: >60 mL/min (ref >60)
GFR calc non Af Amer: 56 mL/min — ABNORMAL LOW (ref >60)
Glucose, Bld: 110 mg/dL — ABNORMAL HIGH (ref 70–99)
Potassium: 4.7 mmol/L (ref 3.5–5.1)
Sodium: 140 mmol/L (ref 135–145)

## 2018-11-21 LAB — GLUCOSE, CAPILLARY
Glucose-Capillary: 103 mg/dL — ABNORMAL HIGH (ref 70–99)
Glucose-Capillary: 106 mg/dL — ABNORMAL HIGH (ref 70–99)
Glucose-Capillary: 61 mg/dL — ABNORMAL LOW (ref 70–99)
Glucose-Capillary: 72 mg/dL (ref 70–99)
Glucose-Capillary: 83 mg/dL (ref 70–99)
Glucose-Capillary: 85 mg/dL (ref 70–99)
Glucose-Capillary: 90 mg/dL (ref 70–99)

## 2018-11-21 LAB — AMMONIA: Ammonia: 51 umol/L — ABNORMAL HIGH (ref 9–35)

## 2018-11-21 LAB — PHOSPHORUS: Phosphorus: 1.7 mg/dL — ABNORMAL LOW (ref 2.5–4.6)

## 2018-11-21 MED ORDER — VITAMIN B-1 100 MG PO TABS: 100.0000 mg | ORAL_TABLET | Freq: Every day | ORAL | Status: DC

## 2018-11-21 MED ORDER — RIFAXIMIN 550 MG PO TABS
550.0000 mg | ORAL_TABLET | Freq: Two times a day (BID) | ORAL | Status: DC
  Administered 2018-11-21 – 2018-11-23 (×4): 550 mg via ORAL
  Filled 2018-11-21 (×4): qty 1

## 2018-11-21 MED ORDER — VITAMIN B-1 100 MG PO TABS
100.0000 mg | ORAL_TABLET | Freq: Every day | ORAL | Status: DC
  Administered 2018-11-22 – 2018-11-23 (×2): 100 mg via ORAL
  Filled 2018-11-21 (×2): qty 1

## 2018-11-21 MED ORDER — CLOPIDOGREL BISULFATE 75 MG PO TABS
75.0000 mg | ORAL_TABLET | Freq: Every day | ORAL | Status: DC
  Administered 2018-11-22 – 2018-11-23 (×2): 75 mg via ORAL
  Filled 2018-11-21 (×2): qty 1

## 2018-11-21 MED ORDER — ASPIRIN 81 MG PO CHEW
81.0000 mg | CHEWABLE_TABLET | Freq: Every day | ORAL | Status: DC
  Administered 2018-11-22 – 2018-11-23 (×2): 81 mg via ORAL
  Filled 2018-11-21 (×2): qty 1

## 2018-11-21 MED ORDER — LEVOTHYROXINE SODIUM 100 MCG PO TABS
100.0000 ug | ORAL_TABLET | Freq: Every day | ORAL | Status: DC
  Administered 2018-11-22 – 2018-11-23 (×2): 100 ug via ORAL
  Filled 2018-11-21 (×2): qty 1

## 2018-11-21 NOTE — Progress Notes (Signed)
  Speech Language Pathology Treatment: Dysphagia  Patient Details Name: Stephen Grimes MRN: [id number] DOB: [birth year] Today's Date: 11/21/2018 Time: 9774-1423 SLP Time Calculation (min) (ACUTE ONLY): 10 min  Assessment / Plan / Recommendation Clinical Impression  Pt continues to tolerate thin liquids without signs coughing, respiratoy function generally improving. However with trials thin liquids and at rest, wet vocal quality and intermittent throat clearing persists. Raised pts awareness and questioned him about vocal quality at baseline He feels this is likely not baseline, probably related to intubation. Regardless, given generally improvements and use of care precaution and supervision, will continue diet and f/u next week for needs.   HPI HPI: 68 yo male with metastatic renal carcinoma , alcoholic cirrhosis reported altered at home, EMS transported to ER altered with vomiting and elevated b/p . Intubated in ER . CT Head neg for acute . COVID ruled out . Ammonia elevated. EKG with ST elevation. Intubated 4/26-4/29.       SLP Plan  Continue with current plan of care       Recommendations  Diet recommendations: Dysphagia 3 (mechanical soft);Thin liquid Liquids provided via: Cup;Straw Medication Administration: Whole meds with puree Supervision: Full supervision/cueing for compensatory strategies Compensations: Slow rate;Small sips/bites Postural Changes and/or Swallow Maneuvers: Seated upright 90 degrees                Oral Care Recommendations: Oral care BID Follow up Recommendations: Skilled Nursing facility SLP Visit Diagnosis: Dysphagia, oropharyngeal phase (R13.12) Plan: Continue with current plan of care       GO               Herbie Baltimore, MA [place] Pager [phone] Office [phone]  Lynann Beaver 11/21/2018, 11:39 AM

## 2018-11-21 NOTE — Progress Notes (Signed)
Palliative Medicine RN Note: Hard Choices book and blank MOST form delivered to pt's room and placed in belongings bag per request from PMT NP Ihor Dow.  Marjie Skiff Rasheeda Mulvehill, RN, BSN, [facility] Palliative Medicine Team 11/21/2018 3:05 PM Office [phone]

## 2018-11-21 NOTE — Progress Notes (Addendum)
Physical Therapy Treatment Patient Details Name: Stephen Grimes MRN: [id number] DOB: [birth year] Today's Date: 11/21/2018    History of Present Illness Pt found unresponsive and adm with respiratory failure and intubated in ED on 4/26. Pt found to have acute anterior MI. Pt extubated 4/29. Pt with acute encephalopathy.  PMH - metastatic renal cell CA with no further treatment options, alcoholic cirrhosis, HTN    PT Comments    Pt making steady progress with mobility.  Feel if he has some initial 24 hour supervision he could return home. Per notes wife reportedly has some dementia. There appears to be a niece and sister in law involved but I am unsure who is the decision maker regarding care available at home.   Follow Up Recommendations  SNF(Unless other family able/willing to provide 24 assist)     Equipment Recommendations  Rolling walker with 5" wheels    Recommendations for Other Services       Precautions / Restrictions Precautions Precautions: Fall Restrictions Weight Bearing Restrictions: No    Mobility  Bed Mobility Overal bed mobility: Needs Assistance Bed Mobility: Supine to Sit     Supine to sit: Min guard;HOB elevated     General bed mobility comments: Assist for lines  Transfers Overall transfer level: Needs assistance Equipment used: Rolling walker (2 wheeled) Transfers: Sit to/from Stand Sit to Stand: Min assist         General transfer comment: Assist to bring hips up and for balance  Ambulation/Gait Ambulation/Gait assistance: Min guard Gait Distance (Feet): 160 Feet Assistive device: Rolling walker (2 wheeled) Gait Pattern/deviations: Step-through pattern;Decreased stride length;Drifts right/left Gait velocity: decr Gait velocity interpretation: <1.31 ft/sec, indicative of household ambulator General Gait Details: Assist for safety. Assist to guide walker. Pt on RA with SpO2 98-100%   Stairs             Wheelchair Mobility     Modified Rankin (Stroke Patients Only)       Balance Overall balance assessment: Needs assistance Sitting-balance support: No upper extremity supported;Feet supported Sitting balance-Leahy Scale: Fair     Standing balance support: No upper extremity supported Standing balance-Leahy Scale: Fair                              Cognition Arousal/Alertness: Awake/alert Behavior During Therapy: WFL for tasks assessed/performed Overall Cognitive Status: Impaired/Different from baseline Area of Impairment: Orientation;Memory;Problem solving;Safety/judgement                 Orientation Level: Disoriented to;Time   Memory: Decreased short-term memory   Safety/Judgement: Decreased awareness of deficits   Problem Solving: Requires verbal cues        Exercises      General Comments        Pertinent Vitals/Pain Pain Assessment: No/denies pain    Home Living                      Prior Function            PT Goals (current goals can now be found in the care plan section) Progress towards PT goals: Progressing toward goals    Frequency    Min 3X/week      PT Plan Current plan remains appropriate    Co-evaluation              AM-PAC PT "6 Clicks" Mobility   Outcome Measure  Help needed turning from your  back to your side while in a flat bed without using bedrails?: A Little Help needed moving from lying on your back to sitting on the side of a flat bed without using bedrails?: A Little Help needed moving to and from a bed to a chair (including a wheelchair)?: A Little Help needed standing up from a chair using your arms (e.g., wheelchair or bedside chair)?: A Little Help needed to walk in hospital room?: A Little Help needed climbing 3-5 steps with a railing? : A Little 6 Click Score: 18    End of Session Equipment Utilized During Treatment: Gait belt Activity Tolerance: Patient tolerated treatment well Patient left: in  chair;with call bell/phone within reach;with chair alarm set Nurse Communication: Mobility status PT Visit Diagnosis: Unsteadiness on feet (R26.81);Other abnormalities of gait and mobility (R26.89);Muscle weakness (generalized) (M62.81)     Time: 4356-8616 PT Time Calculation (min) (ACUTE ONLY): 23 min  Charges:  $Gait Training: 23-37 mins                     [place] Pager [phone] Office [place] 11/21/2018, 12:08 PM

## 2018-11-21 NOTE — Progress Notes (Signed)
Assisted tele visit to patient with family member.  Jaramie Bastos Anderson, RN   

## 2018-11-21 NOTE — Progress Notes (Signed)
NAME:  Dev Dhondt, MRN:  [id number], DOB:  03/16/1951, LOS: 5 ADMISSION DATE:  11/16/2018, CONSULTATION DATE: 4/26  REFERRING MD:  EDP PA Joy , CHIEF COMPLAINT:  AMS/Resp Failure   Brief History   68 yo male with metastatic renal carcinoma , alcoholic cirrhosis reported altered at home, EMS transported to ER altered with vomiting and elevated b/p . Intubated in ER . CT Head neg for acute . COVID ruled out . Ammonia elevated. EKG with ST elevation   History of present illness   68 yo male with metastatic renal carcinoma , alcoholic cirrhosis altered at home . EMS transported to ER  Limited history concerning presentation. No family in ER . Lives with wife at home that has dementia.   On arrival to ER with worsening mental function , vomiting and elevated b/p . Was intubated in ER for airway protection .  ER workup with CT head neg for acute process. COVID ruled out. Ammonia elevated. Chest xray clear . Afebrile and normal wbc. Bigemny and ectopy increased in ER . Repeat EKG with significant ST elevation . Cardiology consulted. Remained unresponsive off sedation . On exam no reflexes /gag . Neuro consulted. Scr high on arrival at 3.8.  Patient did require Labetolol for frequent ectopy . He was started on Heparin for presumed acute MI . K+ low in ER . K+ replaced.   Past Medical History  Metastatic renal CA  HTN  Hypothyroid   Significant Hospital Events     Consults:  4/26 Neuo  4.26 Cards  4/28 Palliative Care Medicine  Procedures:  4/26 ETT >> 4/29  Significant Diagnostic Tests:  4/26 CT Head >no acute  4/26 MRI >> 4/27 EEG> moderated global encephalopathy, no epileptiform activity  4/27 ECHO> LVEF 20%. Akinesis of mid to apical LV 4/28 CXR> no acute cardiopulmonary abnormality  4/29 CXR> no acute cardiopulmonary abnormality  Micro Data:  4/28 MRSA> neg 4/26 UCx> no growth  4/26 BCx> no growth 4/26 SARS CoV-2> not detected  Antimicrobials:  Rifaximin 4/26>>>     Interim history/subjective:  Extubated 11/19/2018  Objective   Blood pressure 123/71, pulse 82, temperature 98 F (36.7 C), temperature source Oral, resp. rate 20, height 5\' 7"  (1.702 m), weight 58 kg, SpO2 99 %.        Intake/Output Summary (Last 24 hours) at 11/21/2018 6294 Last data filed at 11/21/2018 0800 Gross per 24 hour  Intake 2219.99 ml  Output 1480 ml  Net 739.99 ml   Filed Weights   11/19/18 0500 11/20/18 0500 11/21/18 0500  Weight: 57.6 kg 57 kg 58 kg    Examination:  General: Frail male who appears older than stated age no acute distress HEENT: Bleed phonate without problems, no JVD lymphadenopathy is appreciated Neuro: Somewhat dull effect but awake alert and orientated moves all extremities CV: s1s2 rrr, no m/r/g PULM: even/non-labored, lungs bilaterally decreased in bases TM:LYYT, non-tender, bsx4 active  Extremities: warm/dry, 1+ edema  Skin: no rashes or lesions   Resolved Hospital Problem list    Respiratory failure Assessment & Plan:   Acute respiratory failure requiring mechanical ventilation >> resolved -inability to protect airway related to acute encephalopathy  COVID-19 negative 4/26 CXR 4/29 no acute abnormality  Extubated 4/29 to 4 L [place] P  Extubated 11/19/2018 Wean oxygen as tolerated Transfer to progressive care unit and out of the intensive care unit For tried hospital service   Swallow Clinical signs of dysphagia including baseline wet vocal quality and grimacing with  swallowing. Trials of thin liquids raise concern for aspiration given multiple swallows with appearance of poor coordination of swallowing and breathing as well as coughing following large volume intake (3 oz water swallow). Pt did tolerate trials of puree without difficulty. Recommend pt consume meds in puree as needed and ice chips for comfort, will f/u for further trials as pt continues to recover after extubation. Plan: Diet per speech therapy  Acute on Chronic Kidney  disease, improving AKI  Acute kidney injury may be related to hypovolemia in setting of emesis, diarrhea Acute compensated metabolic acidosis likely related to diarrhea Metastatic renal cell carcinoma  Followed by Lorre Munroe NP, [facility] Oncology  Per [place], no further treatment options for onc management  Creatinine down trending Lab Results  Component Value Date   CREATININE 1.29 (H) 11/21/2018   CREATININE 1.38 (H) 11/20/2018   CREATININE 1.72 (H) 11/19/2018    P Continue to trend BMP Monitor urine output KVO IV fluids  Hypoglycemia  CBG (last 3)  Recent Labs    11/21/18 0008 11/21/18 0427 11/21/18 0741  GLUCAP 90 72 83    P  Continue to monitor glucose KVO D5 and a half  Electrolyte abnormalities Hypokalemia Hypernatremia, likely iatrogenic Hypo mag  Recent Labs  Lab 11/19/18 0311 11/20/18 0314 11/21/18 0238  NA 150* 149* 140   Recent Labs  Lab 11/19/18 0311 11/20/18 0314 11/21/18 0238  K 3.3* 3.3* 4.7    P Monitor electrolytes replete as needed  Acute encephalopathy  EtOH abuse, cirrhosis, hepatic failure  EEG 4/27 consistent with moderate encephalopathy, no epileptiform activity CT head 4/26 neg for acute , ammonia elevated  Etiology of encephalopathy likely hepatic, toxic metabolic  P Neurology is following at length Currently awake and alert  Acute systolic heart failure with EF 20%  Abnormal EKG with diffuse ST segment elevation  -takotsubo cardiomyopathy vs large LAD infarct  -Formal ECHO 4/27 with LVEF 20%, mid to apical LV segment akinesis  -elevated troponin peaking at 16.76 HTN Home meds: norvasc, losartan  BP remains soft  P Cardiology input appreciated Candidate for invasive cardiac procedures Continue ASA and Plavix Continue beta-blockers Maintain adequate electrolyte replacement Consider statin   Anemia Recent Labs    11/20/18 0314 11/21/18 0238  HGB 8.9* 7.9*    -likely component of chronic anemia due to  chronic illness   Drop of 1/2 gram overnight, no obvious bleeding P  Monitor CBC Observe for any types of bleeding Menses per protocol  Hypothyroidism  Likely non-compliant with home synthroid (155mcg)  TSH 128 P Continue Synthroid 50 mcg  Goals of Care: Due to severity of acute illness and chronic illness, patient overall prognosis likely poor. Palliative Care Medicine has been consulted and we appreciate their assistance. -multiorgan system failure: chronic hepatic failure, renal failure/metastatic renal carcinoma (per discussion with oncology provider 4/28 patient does not have further onc treatment options and palliation is recommended), new acute systolic heart failure, and acute encephalopathy  After phone conversation with Cassandria Santee , NP 4/29, patient was made DNR/DNI. He has tolerated extubation to 4 L [place] Consider 3 to progressive care and to [facility] service as of 11/21/2018  Best practice:  Diet: NPO  Pain/Anxiety/Delirium protocol (if indicated): PRN vent/versed  VAP protocol (if indicated): continue  DVT prophylaxis: heparin gtt   GI prophylaxis: Protonix  Glucose control: monitor  Mobility: BR  Code Status: Full  Family Communication: Spoke with wife at length 4/28,  Disposition: ICU   Labs   CBC:  Recent Labs  Lab 11/16/18 1241  11/17/18 0713 11/18/18 0327 11/18/18 0804 11/19/18 0311 11/20/18 0314 11/21/18 0238  WBC 5.7  --  7.5 6.4  --  6.0 7.8 6.5  NEUTROABS 3.5  --   --   --   --   --   --   --   HGB 13.0   < > 11.8* 10.5* 9.2* 9.6* 8.9* 7.9*  HCT 39.6   < > 35.1* 32.4* 27.0* 28.6* 27.1* 25.3*  MCV 88.6  --  87.5 89.8  --  88.3 90.6 91.7  PLT 181  --  125* 136*  --  119* 111* 123*   < > = values in this interval not displayed.    Basic Metabolic Panel: Recent Labs  Lab 11/17/18 0114 11/18/18 0327 11/18/18 0804 11/19/18 0311 11/19/18 4332 11/20/18 0314 11/21/18 0238  NA 142 144 147* 150*  --  149* 140  K 4.0 3.5 3.5 3.3*  --  3.3* 4.7  CL  120* 123*  --  121*  --  118* 108  CO2 8* 10*  --  16*  --  22 22  GLUCOSE 87 160*  --  110*  --  102* 110*  BUN 60* 49*  --  45*  --  32* 25*  CREATININE 3.00* 2.28*  --  1.72*  --  1.38* 1.29*  CALCIUM 8.8* 8.9  --  8.5*  --  7.7* 8.2*  MG 1.3* 2.1  --   --  1.9 1.6* 1.9  PHOS 3.9 2.7  --   --  1.3* 2.4* 1.7*   GFR: Estimated Creatinine Clearance: 44.3 mL/min (A) (by C-G formula based on SCr of 1.29 mg/dL (H)). Recent Labs  Lab 11/16/18 1713  11/18/18 0327 11/19/18 0311 11/20/18 0314 11/21/18 0238  PROCALCITON <0.10  --   --   --   --   --   WBC  --    < > 6.4 6.0 7.8 6.5  LATICACIDVEN 3.7*  --   --   --   --   --    < > = values in this interval not displayed.    Liver Function Tests: Recent Labs  Lab 11/16/18 1241 11/17/18 0114 11/18/18 0327 11/19/18 0311 11/20/18 0314  AST 51* 94* 78* 101* 71*  ALT 33 30 28 40 34  ALKPHOS 155* 121 118 140* 121  BILITOT 1.1 1.3* 0.9 0.7 0.8  PROT 8.7* 7.1 6.2* 5.6* 5.1*  ALBUMIN 4.3 3.3* 2.7* 2.4* 2.2*   No results for input(s): LIPASE, AMYLASE in the last 168 hours. Recent Labs  Lab 11/17/18 0713 11/18/18 0327 11/19/18 0838 11/20/18 0314 11/21/18 0238  AMMONIA 62* 71* 35 33 51*    ABG    Component Value Date/Time   PHART 7.353 11/18/2018 0804   PCO2ART 18.8 (LL) 11/18/2018 0804   PO2ART 151.0 (H) 11/18/2018 0804   HCO3 10.4 (L) 11/18/2018 0804   TCO2 11 (L) 11/18/2018 0804   ACIDBASEDEF 13.0 (H) 11/18/2018 0804   O2SAT 99.0 11/18/2018 0804     Coagulation Profile: Recent Labs  Lab 11/16/18 1241 11/17/18 0114  INR 1.2 1.5*    Cardiac Enzymes: Recent Labs  Lab 11/16/18 1713 11/16/18 1843 11/17/18 0103 11/17/18 0937  TROPONINI 8.55* 13.43* 16.76* 15.79*    HbA1C: No results found for: HGBA1C  CBG: Recent Labs  Lab 11/20/18 1639 11/20/18 2044 11/21/18 0008 11/21/18 0427 11/21/18 0741  GLUCAP 81 85 90 72 83    Critical care time: 30  minutes     Richardson Landry Steffani Dionisio ACNP Maryanna Shape PCCM Pager  [phone] till 1 pm If no answer page [phone] 11/21/2018, 9:22 AM

## 2018-11-21 NOTE — Progress Notes (Signed)
Hypoglycemic Event  CBG: 61mg /dL at 20:50  Treatment: 4 oz juice/soda  Symptoms: None  Follow-up CBG: Time:21:19 CBG Result:85  Possible Reasons for Event: Inadequate meal intake  Comments/MD notified:[place] NP    Tad Moore

## 2018-11-22 LAB — MAGNESIUM: Magnesium: 1.9 mg/dL (ref 1.7–2.4)

## 2018-11-22 LAB — CBC
HCT: 29.6 % — ABNORMAL LOW (ref 39.0–52.0)
Hemoglobin: 9.3 g/dL — ABNORMAL LOW (ref 13.0–17.0)
MCH: 29.7 pg (ref 26.0–34.0)
MCHC: 31.4 g/dL (ref 30.0–36.0)
MCV: 94.6 fL (ref 80.0–100.0)
Platelets: 151 10*3/uL (ref 150–400)
RBC: 3.13 MIL/uL — ABNORMAL LOW (ref 4.22–5.81)
RDW: 24.6 % — ABNORMAL HIGH (ref 11.5–15.5)
WBC: 8.4 10*3/uL (ref 4.0–10.5)
nRBC: 3.5 % — ABNORMAL HIGH (ref 0.0–0.2)

## 2018-11-22 LAB — TSH: TSH: 106.553 u[IU]/mL — ABNORMAL HIGH (ref 0.350–4.500)

## 2018-11-22 LAB — GLUCOSE, CAPILLARY
Glucose-Capillary: 103 mg/dL — ABNORMAL HIGH (ref 70–99)
Glucose-Capillary: 104 mg/dL — ABNORMAL HIGH (ref 70–99)
Glucose-Capillary: 109 mg/dL — ABNORMAL HIGH (ref 70–99)
Glucose-Capillary: 120 mg/dL — ABNORMAL HIGH (ref 70–99)
Glucose-Capillary: 72 mg/dL (ref 70–99)
Glucose-Capillary: 81 mg/dL (ref 70–99)
Glucose-Capillary: 93 mg/dL (ref 70–99)

## 2018-11-22 LAB — BASIC METABOLIC PANEL
Anion gap: 10 (ref 5–15)
BUN: 22 mg/dL (ref 8–23)
CO2: 22 mmol/L (ref 22–32)
Calcium: 8.6 mg/dL — ABNORMAL LOW (ref 8.9–10.3)
Chloride: 104 mmol/L (ref 98–111)
Creatinine, Ser: 1.29 mg/dL — ABNORMAL HIGH (ref 0.61–1.24)
GFR calc Af Amer: >60 mL/min (ref >60)
GFR calc non Af Amer: 56 mL/min — ABNORMAL LOW (ref >60)
Glucose, Bld: 85 mg/dL (ref 70–99)
Potassium: 5.3 mmol/L — ABNORMAL HIGH (ref 3.5–5.1)
Sodium: 136 mmol/L (ref 135–145)

## 2018-11-22 LAB — PHOSPHORUS: Phosphorus: 1.7 mg/dL — ABNORMAL LOW (ref 2.5–4.6)

## 2018-11-22 NOTE — TOC Progression Note (Deleted)
Transition of Care [facility]) - Progression Note    Patient Details  Name: Stephen Grimes MRN: [id number] Date of Birth: January 14, 1951  [facility] CM/SW [place], [place] Phone Number: 11/22/2018, 12:16 PM  Clinical Narrative:     CSW has spoken with Georgina Snell at [place] and made the referral for home health services. CSW has spoken with Keon at [place] and ordered a 3-n-1 and rolling walker. CSW has made a palliative consult to [facility] with [facility].   CSW will continue to assist with discharge planning.    Expected Discharge Plan: [place] Barriers to Discharge: Continued Medical Work up  Expected Discharge Plan and Services Expected Discharge Plan: [place] In-house Referral: Clinical Social Work Discharge Planning Services: CM Consult   Living arrangements for the past 2 months: Single Family Home                                       Social Determinants of Health (SDOH) Interventions    Readmission Risk Interventions No flowsheet data found.

## 2018-11-22 NOTE — TOC Progression Note (Signed)
Transition of Care [facility]) - Progression Note    Patient Details  Name: Stephen Grimes MRN: [id number] Date of Birth: 1951-06-09  Transition of Care [facility]) CM/SW [place], [place] Phone Number: 11/22/2018, 12:25 PM  Clinical Narrative:     CSW has spoken with Georgina Snell at [employer] and made the referral for home health services. CSW has spoken with Keon at [place] and ordered a 3-n-1 and rolling walker. CSW has made a palliative consult to [facility] with [facility].   CSW will continue to assist with discharge planning.   Expected Discharge Plan: [place] Barriers to Discharge: Continued Medical Work up  Expected Discharge Plan and Services Expected Discharge Plan: [place] In-house Referral: Clinical Social Work Discharge Planning Services: CM Consult Post Acute Care Choice: Home Health, Durable Medical Equipment Living arrangements for the past 2 months: Single Family Home                 DME Arranged: 3-N-1, Walker rolling DME Agency: [employer] Date DME Agency Contacted: 11/22/18 Time DME Agency Contacted: 1223 Representative spoke with at DME Agency: [place]: Social Work, Therapist, sports, PT, OT [place] Agency: [place] Date [place]: 11/22/18 Time [place]: 1223 Representative spoke with at [place]: [place] ([place]) Interventions    Readmission Risk Interventions No flowsheet data found.

## 2018-11-22 NOTE — Progress Notes (Addendum)
Physical Therapy Treatment Patient Details Name: Stephen Grimes MRN: [id number] DOB: [birth year] Today's Date: 11/22/2018    History of Present Illness Pt found unresponsive and adm with respiratory failure and intubated in ED on 4/26. Pt found to have acute anterior MI. Pt extubated 4/29. Pt with acute encephalopathy.  PMH - metastatic renal cell CA with no further treatment options, alcoholic cirrhosis, HTN    PT Comments    Patient is progressing this visit, increasing ambulation distance and dependency on assistance, as well as cognitively. Discussed with LCSW who affirmed that patient will be having 24 hour care from family members. Feel it will be appropriate to update recs to HHPT at this time. Will cont to follow.    Follow Up Recommendations  Home health PT;Supervision/Assistance - 24 hour     Equipment Recommendations  Rolling walker with 5" wheels  3in1    Recommendations for Other Services       Precautions / Restrictions Precautions Precautions: Fall Restrictions Weight Bearing Restrictions: No    Mobility  Bed Mobility Overal bed mobility: Needs Assistance Bed Mobility: Supine to Sit     Supine to sit: Min assist;HOB elevated     General bed mobility comments: Assist to elevate trunk into sitting and bring hips to EOB  Transfers Overall transfer level: Needs assistance Equipment used: Rolling walker (2 wheeled) Transfers: Sit to/from Stand Sit to Stand: Min assist;+2 safety/equipment         General transfer comment: Assist to bring hips up and for balance  Ambulation/Gait Ambulation/Gait assistance: Min guard Gait Distance (Feet): 150 Feet Assistive device: Rolling walker (2 wheeled) Gait Pattern/deviations: Step-through pattern;Decreased stride length;Drifts right/left     General Gait Details: slow gait with R path deviation noted, he accredits to the weight of his foly bag   Stairs             Wheelchair Mobility    Modified  [place] (Stroke Patients Only)       Balance Overall balance assessment: Needs assistance Sitting-balance support: No upper extremity supported;Feet supported Sitting balance-Leahy Scale: Fair     Standing balance support: No upper extremity supported Standing balance-Leahy Scale: Fair Standing balance comment: Cues for direction, slow gait                            Cognition Arousal/Alertness: Awake/alert Behavior During Therapy: WFL for tasks assessed/performed Overall Cognitive Status: Impaired/Different from baseline                                 General Comments: imprrovment in cognition today. still low and flat affect but approprioate conversation, cues, tells me his family is looking into providing more support at home and he is aware of his limitations today      Exercises      General Comments        Pertinent Vitals/Pain      Home Living                      Prior Function            PT Goals (current goals can now be found in the care plan section) Acute Rehab PT Goals Patient Stated Goal: return home PT Goal Formulation: With patient Time For Goal Achievement: 12/04/18 Potential to Achieve Goals: Good Progress towards PT goals: Progressing toward goals  Frequency    Min 3X/week      PT Plan Discharge plan needs to be updated    Co-evaluation              AM-PAC PT "6 Clicks" Mobility   Outcome Measure  Help needed turning from your back to your side while in a flat bed without using bedrails?: A Little Help needed moving from lying on your back to sitting on the side of a flat bed without using bedrails?: A Little Help needed moving to and from a bed to a chair (including a wheelchair)?: A Little Help needed standing up from a chair using your arms (e.g., wheelchair or bedside chair)?: A Little Help needed to walk in hospital room?: A Little Help needed climbing 3-5 steps with a railing? : A  Little 6 Click Score: 18    End of Session Equipment Utilized During Treatment: Gait belt Activity Tolerance: Patient tolerated treatment well Patient left: in chair;with call bell/phone within reach;with chair alarm set Nurse Communication: Mobility status PT Visit Diagnosis: Unsteadiness on feet (R26.81);Other abnormalities of gait and mobility (R26.89);Muscle weakness (generalized) (M62.81)     Time: 9480-1655 PT Time Calculation (min) (ACUTE ONLY): 25 min  Charges:  $Gait Training: 23-37 mins                     Reinaldo Berber, PT, DPT Acute Rehabilitation Services Pager: [phone] Office: [phone]     Reinaldo Berber 11/22/2018, 9:39 AM

## 2018-11-22 NOTE — Progress Notes (Signed)
Patient Demographics:    Stephen Grimes, is a 68 y.o. male, DOB - 02/22/1951, BMW:[id number]  Admit date - 11/16/2018   Admitting Physician Wilhelmina Mcardle, MD  Outpatient Primary MD for the patient is Seward Carol, MD  LOS - 6   Chief Complaint  Patient presents with  . Altered Mental Status        Subjective:    Stephen Grimes today has no fevers, no emesis,  No chest pain, dyspnea on exertion persist,   Assessment  & Plan :    Active Problems:   Acute respiratory failure (HCC)   Palliative care by specialist   Goals of care, counseling/discussion   Encephalopathy   AKI (acute kidney injury) ([place])   Acute congestive heart failure ([place])   Metastatic renal cell carcinoma [facility])   Brief Summary:- 68 yo male with metastatic renal carcinoma , OSA, HTN, COPD, alcoholic cirrhosis and encephalopathy admitted on 11/16/18 oh with episodes of emesis and elevated BP, developed respiratory failure and was intubated on 11/16/2018 .  COVID ruled out . Ammonia elevated. EKG with ST elevation .  Was admitted to [facility] with cardiology and neurology consult, EEG 4/27 consistent with moderate encephalopathy, no epileptiform activity. CT head 4/26 neg for acute   A/p 1)Acute Hypoxic respiratory failure--intubated 11/16/2018,  extubated 11/19/2018----continue oxygen via nasal cannula, bronchodilators... Patient was intubated due to metabolic encephalopathy/unresponsive episode and inability to protect airway on admission.  COVID-19,    2) Takotsubo cardiomyopathy /abnormal EKG with diffuse ST segment elevation--- Bedside echocardiogram demonstrates what looks like a Takotsubo type appearance with marked reduction in ejection fraction of approximately 15 to 20%.  Note, most recent echocardiogram on care everywhere in 2018 from [facility] demonstrated normal ejection fraction of 65.    Cardiology consult appreciated recommend  medical management, continue aspirin, Plavix and metoprolol   3)Acute on chronic kidney disease IV--- suspect renal function worsened initially due to volume depletion/hypovolemia induced by emesis and diarrhea-- continue to avoid nephrotoxic agents monitor renal function closely  4)Metastatic renal cell cancer -No further treatment options per oncology  5)Acute encephalopathy--with underlying EtOH abuse, cirrhosis, hepatic failure ----overall much improved, continue to rifaximin and lactulose  6)FEN-- Diet per speech therapy  7)HTN--stable, continue metoprolol 12.5 mg twice daily , IV labetalol as needed   8)Hypothyroidism ---Likely non-compliant with home synthroid (153mcg)  TSH 128, Continue Synthroid 100 mcg  9)Social/Ethics--- patient is a DNR/DNI   Disposition/Need for in-Hospital Stay- patient unable to be discharged at this time due to heart failure, renal failure, poor oral intake----possible discharge home with home health in 1 to 2 days  Code Status : DNR  Family Communication:      Disposition Plan  : With home health  Consults  :  PCCM/palliative/cardiology/neurology  DVT Prophylaxis  :    Heparin -    Lab Results  Component Value Date   PLT 151 11/22/2018    Inpatient Medications  Scheduled Meds: . aspirin  81 mg Oral Daily  . clopidogrel  75 mg Oral Daily  . feeding supplement (ENSURE ENLIVE)  237 mL Oral TID BM  . heparin injection (subcutaneous)  5,000 Units Subcutaneous Q8H  . lactulose  30 g Per Tube BID  . levothyroxine  100 mcg  Oral Q0600  . mouth rinse  15 mL Mouth Rinse BID  . metoprolol tartrate  12.5 mg Oral BID  . multivitamin with minerals  1 tablet Oral Daily  . rifaximin  550 mg Oral BID  . thiamine  100 mg Oral Daily   Continuous Infusions: PRN Meds:.acetaminophen, albuterol, fentaNYL (SUBLIMAZE) injection, labetalol, ondansetron (ZOFRAN) IV, sennosides    Anti-infectives (From admission, onward)   Start     Dose/Rate Route  Frequency Ordered Stop   11/21/18 2100  rifaximin (XIFAXAN) tablet 550 mg     550 mg Oral 2 times daily 11/21/18 1507     11/16/18 2100  rifaximin (XIFAXAN) tablet 550 mg  Status:  Discontinued     550 mg Per Tube 2 times daily 11/16/18 1451 11/21/18 1507   11/16/18 1445  ceFEPIme (MAXIPIME) 1 g in sodium chloride 0.9 % 100 mL IVPB  Status:  Discontinued     1 g 200 mL/hr over 30 Minutes Intravenous Every 24 hours 11/16/18 1433 11/16/18 1448   11/16/18 1445  vancomycin (VANCOCIN) 1,250 mg in sodium chloride 0.9 % 250 mL IVPB  Status:  Discontinued     1,250 mg 166.7 mL/hr over 90 Minutes Intravenous  Once 11/16/18 1433 11/16/18 1448   11/16/18 1430  metroNIDAZOLE (FLAGYL) IVPB 500 mg  Status:  Discontinued     500 mg 100 mL/hr over 60 Minutes Intravenous  Once 11/16/18 1429 11/16/18 1549        Objective:   Vitals:   11/22/18 0408 11/22/18 0534 11/22/18 0701 11/22/18 0900  BP: 108/71   91/64  Pulse: 77  71 76  Resp: (!) 21  15 13   Temp: 98.4 F (36.9 C)     TempSrc: Oral     SpO2: 100%  100% 100%  Weight:  93.4 kg    Height:        Wt Readings from Last 3 Encounters:  11/22/18 93.4 kg     Intake/Output Summary (Last 24 hours) at 11/22/2018 1708 Last data filed at 11/22/2018 0701 Gross per 24 hour  Intake 260 ml  Output 2300 ml  Net -2040 ml     Physical Exam Patient is examined daily including today on 11/21/28 , exams remain the same as of yesterday except that has changed   Gen:- Awake Alert, in no acute distress HEENT:- [place].AT, No sclera icterus Neck-Supple Neck,No JVD,.  Lungs-diminished in bases, no wheezing CV- S1, S2 normal, regular  Abd-  +ve B.Sounds, Abd Soft, No tenderness,    Extremity/Skin:-trace pitting edema, pedal pulses present  Psych-affect is appropriate, oriented x3 Neuro-no new focal deficits, no tremors Rectal--- will remove rectal tube GU--continue condom cath  Data Review:   Micro Results Recent Results (from the past 240 hour(s))  SARS  Coronavirus 2 [facility] order, Performed in [place] hospital lab)     Status: None   Collection Time: 11/16/18  1:12 PM  Result Value Ref Range Status   SARS Coronavirus 2 NEGATIVE NEGATIVE Final    Comment: (NOTE) If result is NEGATIVE SARS-CoV-2 target nucleic acids are NOT DETECTED. The SARS-CoV-2 RNA is generally detectable in upper and lower  respiratory specimens during the acute phase of infection. The lowest  concentration of SARS-CoV-2 viral copies this assay can detect is 250  copies / mL. A negative result does not preclude SARS-CoV-2 infection  and should not be used as the sole basis for treatment or other  patient management decisions.  A negative result may occur with  improper specimen collection / handling, submission of specimen other  than nasopharyngeal swab, presence of viral mutation(s) within the  areas targeted by this assay, and inadequate number of viral copies  (<250 copies / mL). A negative result must be combined with clinical  observations, patient history, and epidemiological information. If result is POSITIVE SARS-CoV-2 target nucleic acids are DETECTED. The SARS-CoV-2 RNA is generally detectable in upper and lower  respiratory specimens dur ing the acute phase of infection.  Positive  results are indicative of active infection with SARS-CoV-2.  Clinical  correlation with patient history and other diagnostic information is  necessary to determine patient infection status.  Positive results do  not rule out bacterial infection or co-infection with other viruses. If result is PRESUMPTIVE POSTIVE SARS-CoV-2 nucleic acids MAY BE PRESENT.   A presumptive positive result was obtained on the submitted specimen  and confirmed on repeat testing.  While 2019 novel coronavirus  (SARS-CoV-2) nucleic acids may be present in the submitted sample  additional confirmatory testing may be necessary for epidemiological  and / or clinical management purposes  to  differentiate between  SARS-CoV-2 and other Sarbecovirus currently known to infect humans.  If clinically indicated additional testing with an alternate test  methodology [phone]) is advised. The SARS-CoV-2 RNA is generally  detectable in upper and lower respiratory sp ecimens during the acute  phase of infection. The expected result is Negative. Fact Sheet for Patients:  StrictlyIdeas.no Fact Sheet for Healthcare Providers: BankingDealers.co.za This test is not yet approved or cleared by the [place] FDA and has been authorized for detection and/or diagnosis of SARS-CoV-2 by FDA under an Emergency Use Authorization (EUA).  This EUA will remain in effect (meaning this test can be used) for the duration of the COVID-19 declaration under Section 564(b)(1) of the Act, 21 U.S.C. section 360bbb-3(b)(1), unless the authorization is terminated or revoked sooner. Performed at [facility], [place] [address], [place], [place] [address]   Urine culture     Status: None   Collection Time: 11/16/18  2:30 PM  Result Value Ref Range Status   Specimen Description URINE, RANDOM  Final   Special Requests NONE  Final   Culture   Final    NO GROWTH Performed at [facility], [place] [address] [place], [place] [address]    Report Status 11/17/2018 FINAL  Final  Blood Culture (routine x 2)     Status: None   Collection Time: 11/16/18  4:55 PM  Result Value Ref Range Status   Specimen Description BLOOD LEFT ANTECUBITAL  Final   Special Requests   Final    AEROBIC BOTTLE ONLY Blood Culture results may not be optimal due to an inadequate volume of blood received in culture bottles   Culture   Final    NO GROWTH 5 DAYS Performed at [facility], [place] [address], [place], [place] [address]    Report Status 11/21/2018 FINAL  Final  Blood Culture (routine x 2)     Status: None   Collection Time: 11/16/18  5:10 PM  Result Value Ref Range  Status   Specimen Description BLOOD LEFT HAND  Final   Special Requests AEROBIC BOTTLE ONLY Blood Culture adequate volume  Final   Culture   Final    NO GROWTH 5 DAYS Performed at [facility], [place] [address], [place], [place] [address]    Report Status 11/21/2018 FINAL  Final  MRSA PCR Screening  Status: None   Collection Time: 11/18/18 12:44 AM  Result Value Ref Range Status   MRSA by PCR NEGATIVE NEGATIVE Final    Comment:        The GeneXpert MRSA Assay (FDA approved for NASAL specimens only), is one component of a comprehensive MRSA colonization surveillance program. It is not intended to diagnose MRSA infection nor to guide or monitor treatment for MRSA infections. Performed at [facility], [place] [address], [place], [place] [address]     Radiology Reports Dg Abdomen 1 View  Result Date: 11/16/2018 CLINICAL DATA:  Repositioning of orogastric tube. EXAM: ABDOMEN - 1 VIEW COMPARISON:  Earlier the same day. FINDINGS: 1340 hours. The orogastric tube has been partially retracted, tip in the gastric fundus. No other significant changes are identified. There is gas throughout the small and large bowel, but no significant distension. Surgical clips are present in the right upper quadrant. IMPRESSION: Orogastric tube tip in the gastric fundus, less looped in the proximal stomach. Electronically Signed   By: Richardean Sale M.D.   On: 11/16/2018 14:07   Ct Head Wo Contrast  Result Date: 11/16/2018 CLINICAL DATA:  Found unresponsive. EXAM: CT HEAD WITHOUT CONTRAST TECHNIQUE: Contiguous axial images were obtained from the base of the skull through the vertex without intravenous contrast. COMPARISON:  None. FINDINGS: Brain: No evidence of acute infarction, hemorrhage, hydrocephalus, extra-axial collection or mass lesion/mass effect. Vascular: Calcification of the cavernous internal carotid arteries consistent with cerebrovascular atherosclerotic disease. No signs of  intracranial large vessel occlusion. Skull: Normal. Negative for fracture or focal lesion. Sinuses/Orbits: No acute finding. Other: None. Electronically Signed   By: Staci Righter M.D.   On: 11/16/2018 13:29   Dg Chest Port 1 View  Result Date: 11/21/2018 CLINICAL DATA:  Respiratory failure. EXAM: PORTABLE CHEST 1 VIEW COMPARISON:  11/19/2018 FINDINGS: Endotracheal and gastric decompression tube have been removed. Lung volumes are lower bilaterally with bibasilar atelectasis present. There is no evidence of pulmonary edema, consolidation, pneumothorax, nodule or pleural fluid. The heart size is stable and normal. IMPRESSION: Status post extubation. Lower lung volumes with bibasilar atelectasis. Electronically Signed   By: Aletta Edouard M.D.   On: 11/21/2018 07:37   Dg Chest Port 1 View  Result Date: 11/19/2018 CLINICAL DATA:  Respiratory failure. EXAM: PORTABLE CHEST 1 VIEW COMPARISON:  Radiograph of November 18, 2018. FINDINGS: The heart size and mediastinal contours are within normal limits. No pneumothorax or pleural effusion is noted. Endotracheal and nasogastric tubes are unchanged in position. Both lungs are clear. The visualized skeletal structures are unremarkable. IMPRESSION: Stable support apparatus. No acute cardiopulmonary abnormality seen. Electronically Signed   By: Marijo Conception M.D.   On: 11/19/2018 07:15   Dg Chest Port 1 View  Result Date: 11/18/2018 CLINICAL DATA:  Acute respiratory failure. EXAM: PORTABLE CHEST 1 VIEW COMPARISON:  Radiograph of November 17, 2018. FINDINGS: The heart size and mediastinal contours are within normal limits. Endotracheal nasogastric tubes are unchanged in position. No pneumothorax or pleural effusion is noted. Both lungs are clear. The visualized skeletal structures are unremarkable. IMPRESSION: Stable support apparatus. No acute cardiopulmonary abnormality seen. Electronically Signed   By: Marijo Conception M.D.   On: 11/18/2018 07:28   Dg Chest Port 1 View   Result Date: 11/17/2018 CLINICAL DATA:  Respiratory failure. EXAM: PORTABLE CHEST 1 VIEW COMPARISON:  Radiograph of November 16, 2018. FINDINGS: The heart size and mediastinal contours are within normal limits. Endotracheal and nasogastric tubes are unchanged  in position. No pneumothorax or pleural effusion is noted. Both lungs are clear. The visualized skeletal structures are unremarkable. IMPRESSION: Stable support apparatus. No acute cardiopulmonary abnormality seen. Electronically Signed   By: Marijo Conception M.D.   On: 11/17/2018 07:58   Dg Chest Portable 1 View  Result Date: 11/16/2018 CLINICAL DATA:  Intubated patient. EXAM: PORTABLE CHEST 1 VIEW COMPARISON:  None. FINDINGS: 1314 hours. Tip of the endotracheal tube is in the mid trachea. An orogastric tube is looped in the proximal stomach. The heart size and mediastinal contours are normal. There is mild aortic atherosclerosis. The lungs are clear. There is no pleural effusion or pneumothorax. No acute osseous findings are evident. IMPRESSION: Satisfactory position of the endotracheal and orogastric tubes. No acute cardiopulmonary process identified. Electronically Signed   By: Richardean Sale M.D.   On: 11/16/2018 14:06   Dg Abd Portable 1 View  Result Date: 11/16/2018 CLINICAL DATA:  Orogastric tube placement. EXAM: PORTABLE ABDOMEN - 1 VIEW COMPARISON:  None. FINDINGS: 1317 hours. Orogastric tube is looped in the proximal stomach. There is gas throughout the small and large bowel, but no significant bowel distension. There is no free intraperitoneal air. Surgical clips are present in the right upper quadrant of the abdomen. IMPRESSION: Orogastric tube is looped in the proximal stomach. Electronically Signed   By: Richardean Sale M.D.   On: 11/16/2018 14:05     CBC Recent Labs  Lab 11/16/18 1241  11/18/18 0327 11/18/18 0804 11/19/18 0311 11/20/18 0314 11/21/18 0238 11/22/18 0518  WBC 5.7   < > 6.4  --  6.0 7.8 6.5 8.4  HGB 13.0   < >  10.5* 9.2* 9.6* 8.9* 7.9* 9.3*  HCT 39.6   < > 32.4* 27.0* 28.6* 27.1* 25.3* 29.6*  PLT 181   < > 136*  --  119* 111* 123* 151  MCV 88.6   < > 89.8  --  88.3 90.6 91.7 94.6  MCH 29.1   < > 29.1  --  29.6 29.8 28.6 29.7  MCHC 32.8   < > 32.4  --  33.6 32.8 31.2 31.4  RDW 21.8*   < > 22.9*  --  23.2* 24.1* 24.3* 24.6*  LYMPHSABS 1.4  --   --   --   --   --   --   --   MONOABS 0.7  --   --   --   --   --   --   --   EOSABS 0.0  --   --   --   --   --   --   --   BASOSABS 0.0  --   --   --   --   --   --   --    < > = values in this interval not displayed.    Chemistries  Recent Labs  Lab 11/16/18 1241  11/17/18 0114 11/18/18 0327 11/18/18 0804 11/19/18 0311 11/19/18 6503 11/20/18 0314 11/21/18 0238 11/22/18 0518  NA 136   < > 142 144 147* 150*  --  149* 140 136  K 3.4*   < > 4.0 3.5 3.5 3.3*  --  3.3* 4.7 5.3*  CL 112*   < > 120* 123*  --  121*  --  118* 108 104  CO2 9*   < > 8* 10*  --  16*  --  22 22 22   GLUCOSE 96   < > 87 160*  --  110*  --  102* 110* 85  BUN 72*   < > 60* 49*  --  45*  --  32* 25* 22  CREATININE 3.46*   < > 3.00* 2.28*  --  1.72*  --  1.38* 1.29* 1.29*  CALCIUM 9.9   < > 8.8* 8.9  --  8.5*  --  7.7* 8.2* 8.6*  MG  --    < > 1.3* 2.1  --   --  1.9 1.6* 1.9 1.9  AST 51*  --  94* 78*  --  101*  --  71*  --   --   ALT 33  --  30 28  --  40  --  34  --   --   ALKPHOS 155*  --  121 118  --  140*  --  121  --   --   BILITOT 1.1  --  1.3* 0.9  --  0.7  --  0.8  --   --    < > = values in this interval not displayed.   ------------------------------------------------------------------------------------------------------------------ No results for input(s): CHOL, HDL, LDLCALC, TRIG, CHOLHDL, LDLDIRECT in the last 72 hours.  No results found for: HGBA1C ------------------------------------------------------------------------------------------------------------------ Recent Labs    11/22/18 0518  TSH 106.553*    ------------------------------------------------------------------------------------------------------------------ No results for input(s): VITAMINB12, FOLATE, FERRITIN, TIBC, IRON, RETICCTPCT in the last 72 hours.  Coagulation profile Recent Labs  Lab 11/16/18 1241 11/17/18 0114  INR 1.2 1.5*    No results for input(s): DDIMER in the last 72 hours.  Cardiac Enzymes Recent Labs  Lab 11/16/18 1843 11/17/18 0103 11/17/18 0937  TROPONINI 13.43* 16.76* 15.79*   ------------------------------------------------------------------------------------------------------------------ No results found for: BNP   Roxan Hockey M.D on 11/22/2018 at 5:08 PM  Go to [email or web address] - for contact info  [facility] - Office  [phone]

## 2018-11-23 ENCOUNTER — Encounter: Payer: Self-pay | Admitting: Pulmonary Disease

## 2018-11-23 DIAGNOSIS — K7290 Hepatic failure, unspecified without coma: Secondary | ICD-10-CM | POA: Diagnosis present

## 2018-11-23 DIAGNOSIS — K7682 Hepatic encephalopathy: Secondary | ICD-10-CM | POA: Diagnosis present

## 2018-11-23 LAB — CBC
HCT: 28.6 % — ABNORMAL LOW (ref 39.0–52.0)
Hemoglobin: 8.9 g/dL — ABNORMAL LOW (ref 13.0–17.0)
MCH: 29.5 pg (ref 26.0–34.0)
MCHC: 31.1 g/dL (ref 30.0–36.0)
MCV: 94.7 fL (ref 80.0–100.0)
Platelets: 165 10*3/uL (ref 150–400)
RBC: 3.02 MIL/uL — ABNORMAL LOW (ref 4.22–5.81)
RDW: 24.6 % — ABNORMAL HIGH (ref 11.5–15.5)
WBC: 9.3 10*3/uL (ref 4.0–10.5)
nRBC: 2.9 % — ABNORMAL HIGH (ref 0.0–0.2)

## 2018-11-23 LAB — GLUCOSE, CAPILLARY
Glucose-Capillary: 114 mg/dL — ABNORMAL HIGH (ref 70–99)
Glucose-Capillary: 80 mg/dL (ref 70–99)
Glucose-Capillary: 92 mg/dL (ref 70–99)

## 2018-11-23 MED ORDER — ACETAMINOPHEN 325 MG PO TABS: 650.0000 mg | ORAL_TABLET | ORAL | 1 refills | Status: DC | PRN

## 2018-11-23 MED ORDER — THIAMINE HCL 100 MG PO TABS: 100.0000 mg | ORAL_TABLET | Freq: Every day | ORAL | 2 refills | Status: DC

## 2018-11-23 MED ORDER — RIFAXIMIN 550 MG PO TABS: 550.0000 mg | ORAL_TABLET | Freq: Two times a day (BID) | ORAL | 2 refills | Status: DC

## 2018-11-23 MED ORDER — LEVOTHYROXINE SODIUM 100 MCG PO TABS: 100.0000 ug | ORAL_TABLET | Freq: Every day | ORAL | 2 refills | Status: AC

## 2018-11-23 MED ORDER — METOPROLOL TARTRATE 25 MG PO TABS: 12.5000 mg | ORAL_TABLET | Freq: Two times a day (BID) | ORAL | 2 refills | Status: DC

## 2018-11-23 MED ORDER — ENSURE ENLIVE PO LIQD: 237.0000 mL | Freq: Three times a day (TID) | ORAL | 4 refills | Status: DC

## 2018-11-23 MED ORDER — CLOPIDOGREL BISULFATE 75 MG PO TABS: 75.0000 mg | ORAL_TABLET | Freq: Every day | ORAL | 2 refills | Status: DC

## 2018-11-23 MED ORDER — ASPIRIN 81 MG PO CHEW: 81.0000 mg | CHEWABLE_TABLET | Freq: Every day | ORAL | 2 refills | Status: AC

## 2018-11-23 MED ORDER — LACTULOSE 10 GM/15ML PO SOLN: 20.0000 g | Freq: Two times a day (BID) | ORAL | 0 refills | Status: DC

## 2018-11-23 MED ORDER — LOSARTAN POTASSIUM 25 MG PO TABS: 25.0000 mg | ORAL_TABLET | Freq: Every day | ORAL | 3 refills | Status: DC

## 2018-11-23 MED ORDER — LACTULOSE 10 GRAM/15 ML ORAL SOLUTION
0 days
Start: 2018-11-23 — End: ?

## 2018-11-23 NOTE — Progress Notes (Signed)
Manufacturing engineer [facility]) Community Based Palliative Care  Received request from [place] for patient/family interest in [place] services at home after discharge. Spoke with Raven by phone to confirm and explain services. Understand from [place] is likely home Monday. Will follow up Monday to schedule first visit.   Thank you,  Erling Conte, LCSW [phone]  [place] are listed daily on AMION under Hospice and [place]

## 2018-11-23 NOTE — Care Management (Addendum)
Notified Dr Denton Brick that [place] will need a prior auth. Provided him with number [phone] or [email or web address]     I called and obtained prior authorization for rifaximin/Xifaxan--- Prior Auth number is [id number]  Roxan Hockey, MD

## 2018-11-23 NOTE — Discharge Instructions (Signed)
1)Avoid ibuprofen/Advil/Aleve/Motrin/Goody Powders/Naproxen/BC powders/Meloxicam/Diclofenac/Indomethacin and other Nonsteroidal anti-inflammatory medications as these will make you more likely to bleed and can cause stomach ulcers, can also cause Kidney problems.   2)Stop Amlodipine  3)Take all other medications as prescribed  4)Repeat CMP/complete metabolic profile and Repeat CBC/complete blood count within a week with PCP

## 2018-11-23 NOTE — Care Management (Addendum)
Notified Dr Denton Brick that [place] will need a prior auth. Provided him with number [phone] or [email or web address]  MD called and obtained prior authorization for rifaximin/Xifaxan--- Prior Auth number is [id number]  Cost for first 6 days is 13.67, total cost of prescription is 137.00. Pharmacy will order remainder of rx and it will be available tomorrow. I notified patient's wife.   Carles Collet, RN

## 2018-11-23 NOTE — Discharge Summary (Addendum)
Stephen Grimes, is a 67 y.o. male  DOB 11-07-1950  MRN [id number].  Admission date:  11/16/2018  Admitting Physician  Wilhelmina Mcardle, MD  Discharge Date:  11/23/2018   Primary MD  Seward Carol, MD  Recommendations for primary care physician for things to follow:   1)Avoid ibuprofen/Advil/Aleve/Motrin/Goody Powders/Naproxen/BC powders/Meloxicam/Diclofenac/Indomethacin and other Nonsteroidal anti-inflammatory medications as these will make you more likely to bleed and can cause stomach ulcers, can also cause Kidney problems.   2)Stop Amlodipine  3)Take all other medications as prescribed  4)Repeat CMP/complete metabolic profile and Repeat CBC/complete blood count within a week with PCP  Admission Diagnosis  Encephalopathy [G93.40] AKI (acute kidney injury) ([place]) [N17.9]   Discharge Diagnosis  Encephalopathy [G93.40] AKI (acute kidney injury) ([place]) [N17.9]   Principal Problem:   AKI (acute kidney injury) ([place]) Active Problems:   Acute respiratory failure ([place])   Palliative care by specialist   Goals of care, counseling/discussion   Encephalopathy   Acute congestive heart failure ([place])   Metastatic renal cell carcinoma ([place])   Hepatic encephalopathy ([place])      Past Medical History:  Diagnosis Date   Alcoholic cirrhosis ([place])    Cirrhosis ([place])    HTN (hypertension)    Metastatic renal cell carcinoma ([place])    Renal cancer ([place])    Renal impairment    Thyroid disease     History reviewed. No pertinent surgical history.     HPI  from the history and physical done on the day of admission:   68 yo male with metastatic renal carcinoma , alcoholic cirrhosis altered at home . EMS transported to ER  Limited history concerning presentation. No family in ER .  Marland Kitchen Lives with wife at home that has dementia.   On arrival to ER with worsening mental function , vomiting and elevated b/p . Was  intubated in ER for airway protection .  ER workup with CT head neg for acute process. COVID ruled out. Ammonia elevated. Chest xray clear . Afebrile and normal wbc. Bigemny and ectopy increased in ER . Repeat EKG with significant ST elevation . Cardiology consulted. Remained unresponsive off sedation . On exam no reflexes /gag . Neuro consulted. Scr high on arrival at 3.8.  Patient did require Labetolol for frequent ectopy . He was started on Heparin for presumed acute MI . K+ low in ER . K+ replaced.    68 yo male with metastatic renal carcinoma , alcoholic cirrhosis reported altered at home, EMS transported to ER altered with vomiting and elevated b/p . Intubated in ER . CT Head neg for acute . COVID ruled out . Ammonia elevated. EKG with ST elevation     Hospital Course:   Brief Summary:- 68 yo male with metastatic renal carcinoma , OSA, HTN, COPD, alcoholic cirrhosis and encephalopathy admitted on 11/16/18 oh with episodes of emesis and elevated BP, developed respiratory failure and was intubated on 11/16/2018 .  COVID ruled out . Ammonia elevated. EKG with ST elevation .  Was admitted to [facility]  with cardiology and neurology consult, EEG 4/27 consistent with moderate encephalopathy, no epileptiform activity. CT head 4/26 neg for acute  A/p 1)Acute Hypoxic respiratory failure--intubated 11/16/2018,  extubated 11/19/2018----Pt has been weaned off oxygen , hypoxia completely resolved even with ambulation . c/n bronchodilators... Patient was intubated due to metabolic encephalopathy/unresponsive episode and inability to protect airway on admission.  COVID-19 Negative    2)Takotsubo cardiomyopathy /abnormal EKG with diffuse ST segment elevation--- Bedside echocardiogram demonstrates what looks like a Takotsubo type appearance with marked reduction in ejection fraction of approximately 15 to 20%. Note, most recent echocardiogram on care everywhere in 2018 from [facility] demonstrated normal ejection fraction of  65.  Cardiology consult appreciated recommend medical management, continue aspirin, Plavix and metoprolol (consider switching to Coreg when BP allows), give losartan 25 mg daily   3)Acute on chronic kidney disease IV--- suspect renal function worsened initially due to volume depletion/hypovolemia induced by emesis and diarrhea-- once improved, creatinine is down to 1.29 from a Peak of 3.46.  continue to avoid nephrotoxic agents monitor renal function closely..... Repeat BMP within a week especially with resumption of losartan  4)Metastatic renal cell cancer -No further treatment options per oncology  5)Acute encephalopathy--with underlying EtOH abuse, cirrhosis, hepatic failure ----overall much improved, continue to rifaximin and lactulose  6)FEN-- soft diet advised by speech pathologist  7)HTN--stable, continue metoprolol 12.5 mg twice daily ,  stop amlodipine due to soft BP, okay to add losartan 25 mg daily   8)Hypothyroidism ---Likely non-compliant with home synthroid (117mcg)  TSH 128, Continue Synthroid 100 mcg..., Repeat TSH in 4 weeks  9)Social/Ethics--- patient is a DNR/DNI   Disposition --- discharge home with home health services, home health RN, home med social worker, palliative care consult as outpatient, home health PT and OT  Code Status : DNR  Family Communication:      Disposition Plan  : With home health  Consults  :  PCCM/palliative/cardiology/neurology  Discharge Condition: stable  Follow UP  Follow-up Information    Seward Carol, MD Follow up.   Specialty:  Internal Medicine Contact information: [address] [place] [address] [phone]        [facility] Follow up.   Specialty:  Home Health Services Why:  They will contact you in 1-2 days after discharge to set up first appointment.  Contact information: [address] [place] [address] [phone]        [facility]  Palliative Follow up.   Why:  They will contact you within 1-2 days after discharge to start palliative referral  Contact information: [place] [address] [phone]           Diet and Activity recommendation:  As advised  Discharge Instructions    Discharge Instructions    Amb Referral to Palliative Care   Complete by:  As directed    Outpatient palliative care follow-up   Call MD for:  difficulty breathing, headache or visual disturbances   Complete by:  As directed    Call MD for:  persistant dizziness or light-headedness   Complete by:  As directed    Call MD for:  persistant nausea and vomiting   Complete by:  As directed    Diet - low sodium heart healthy   Complete by:  As directed    Soft diet advised   Discharge instructions   Complete by:  As directed    1)Avoid ibuprofen/Advil/Aleve/Motrin/Goody Powders/Naproxen/BC powders/Meloxicam/Diclofenac/Indomethacin and other Nonsteroidal  anti-inflammatory medications as these will make you more likely to bleed and can cause stomach ulcers, can also cause Kidney problems.   2)Stop Amlodipine  3)Take all other medications as prescribed  4)Repeat CMP/complete metabolic profile and Repeat CBC/complete blood count within a week with PCP   Increase activity slowly   Complete by:  As directed         Discharge Medications     Allergies as of 11/23/2018   No Known Allergies     Medication List    STOP taking these medications   amLODipine 2.5 MG tablet Commonly known as:  NORVASC   Anti-Diarrheal 2 MG tablet Generic drug:  loperamide   diphenoxylate-atropine 2.5-0.025 MG tablet Commonly known as:  LOMOTIL     TAKE these medications   acetaminophen 325 MG tablet Commonly known as:  TYLENOL Take 2 tablets (650 mg total) by mouth every 4 (four) hours as needed for mild pain, fever or headache (temp > 101.5).   aspirin 81 MG chewable tablet Chew 1 tablet (81 mg total) by mouth  daily with breakfast.   clopidogrel 75 MG tablet Commonly known as:  PLAVIX Take 1 tablet (75 mg total) by mouth daily. Start taking on:  Nov 24, 2018   feeding supplement (ENSURE ENLIVE) Liqd Take 237 mLs by mouth 3 (three) times daily between meals.   lactulose 10 GM/15ML solution Commonly known as:  CHRONULAC Place 30 mLs (20 g total) into feeding tube 2 (two) times daily.   levothyroxine 100 MCG tablet Commonly known as:  SYNTHROID Take 1 tablet (100 mcg total) by mouth daily before breakfast. What changed:    medication strength  how much to take  when to take this   losartan 25 MG tablet Commonly known as:  COZAAR Take 1 tablet (25 mg total) by mouth daily.   metoprolol tartrate 25 MG tablet Commonly known as:  LOPRESSOR Take 0.5 tablets (12.5 mg total) by mouth 2 (two) times daily.   multivitamin Tabs tablet Take 1 tablet by mouth daily.   omeprazole 20 MG capsule Commonly known as:  PRILOSEC Take 20 mg by mouth daily.   ondansetron 8 MG disintegrating tablet Commonly known as:  ZOFRAN-ODT Take 8 mg by mouth 2 (two) times daily as needed for nausea/vomiting.   rifaximin 550 MG Tabs tablet Commonly known as:  XIFAXAN Take 1 tablet (550 mg total) by mouth 2 (two) times daily.   thiamine 100 MG tablet Take 1 tablet (100 mg total) by mouth daily. Start taking on:  Nov 24, 2018   urea 40 % Crea Commonly known as:  CARMOL Apply 1 application topically See admin instructions. Apply to sore areas on hands and feet            Durable Medical Equipment  (From admission, onward)         Start     Ordered   11/22/18 1245  For home use only DME 3 n 1  Once     11/22/18 1244   11/22/18 1245  For home use only DME Walker rolling  Once    Question:  Patient needs a walker to treat with the following condition  Answer:  Weakness   11/22/18 1244          Major procedures and Radiology Reports - PLEASE review detailed and final reports for all details,  in brief -   Dg Abdomen 1 View  Result Date: 11/16/2018 CLINICAL DATA:  Repositioning of orogastric tube. EXAM:  ABDOMEN - 1 VIEW COMPARISON:  Earlier the same day. FINDINGS: 1340 hours. The orogastric tube has been partially retracted, tip in the gastric fundus. No other significant changes are identified. There is gas throughout the small and large bowel, but no significant distension. Surgical clips are present in the right upper quadrant. IMPRESSION: Orogastric tube tip in the gastric fundus, less looped in the proximal stomach. Electronically Signed   By: Richardean Sale M.D.   On: 11/16/2018 14:07   Ct Head Wo Contrast  Result Date: 11/16/2018 CLINICAL DATA:  Found unresponsive. EXAM: CT HEAD WITHOUT CONTRAST TECHNIQUE: Contiguous axial images were obtained from the base of the skull through the vertex without intravenous contrast. COMPARISON:  None. FINDINGS: Brain: No evidence of acute infarction, hemorrhage, hydrocephalus, extra-axial collection or mass lesion/mass effect. Vascular: Calcification of the cavernous internal carotid arteries consistent with cerebrovascular atherosclerotic disease. No signs of intracranial large vessel occlusion. Skull: Normal. Negative for fracture or focal lesion. Sinuses/Orbits: No acute finding. Other: None. Electronically Signed   By: Staci Righter M.D.   On: 11/16/2018 13:29   Dg Chest Port 1 View  Result Date: 11/21/2018 CLINICAL DATA:  Respiratory failure. EXAM: PORTABLE CHEST 1 VIEW COMPARISON:  11/19/2018 FINDINGS: Endotracheal and gastric decompression tube have been removed. Lung volumes are lower bilaterally with bibasilar atelectasis present. There is no evidence of pulmonary edema, consolidation, pneumothorax, nodule or pleural fluid. The heart size is stable and normal. IMPRESSION: Status post extubation. Lower lung volumes with bibasilar atelectasis. Electronically Signed   By: Aletta Edouard M.D.   On: 11/21/2018 07:37   Dg Chest Port 1  View  Result Date: 11/19/2018 CLINICAL DATA:  Respiratory failure. EXAM: PORTABLE CHEST 1 VIEW COMPARISON:  Radiograph of November 18, 2018. FINDINGS: The heart size and mediastinal contours are within normal limits. No pneumothorax or pleural effusion is noted. Endotracheal and nasogastric tubes are unchanged in position. Both lungs are clear. The visualized skeletal structures are unremarkable. IMPRESSION: Stable support apparatus. No acute cardiopulmonary abnormality seen. Electronically Signed   By: Marijo Conception M.D.   On: 11/19/2018 07:15   Dg Chest Port 1 View  Result Date: 11/18/2018 CLINICAL DATA:  Acute respiratory failure. EXAM: PORTABLE CHEST 1 VIEW COMPARISON:  Radiograph of November 17, 2018. FINDINGS: The heart size and mediastinal contours are within normal limits. Endotracheal nasogastric tubes are unchanged in position. No pneumothorax or pleural effusion is noted. Both lungs are clear. The visualized skeletal structures are unremarkable. IMPRESSION: Stable support apparatus. No acute cardiopulmonary abnormality seen. Electronically Signed   By: Marijo Conception M.D.   On: 11/18/2018 07:28   Dg Chest Port 1 View  Result Date: 11/17/2018 CLINICAL DATA:  Respiratory failure. EXAM: PORTABLE CHEST 1 VIEW COMPARISON:  Radiograph of November 16, 2018. FINDINGS: The heart size and mediastinal contours are within normal limits. Endotracheal and nasogastric tubes are unchanged in position. No pneumothorax or pleural effusion is noted. Both lungs are clear. The visualized skeletal structures are unremarkable. IMPRESSION: Stable support apparatus. No acute cardiopulmonary abnormality seen. Electronically Signed   By: Marijo Conception M.D.   On: 11/17/2018 07:58   Dg Chest Portable 1 View  Result Date: 11/16/2018 CLINICAL DATA:  Intubated patient. EXAM: PORTABLE CHEST 1 VIEW COMPARISON:  None. FINDINGS: 1314 hours. Tip of the endotracheal tube is in the mid trachea. An orogastric tube is looped in the  proximal stomach. The heart size and mediastinal contours are normal. There is mild aortic atherosclerosis. The lungs are clear.  There is no pleural effusion or pneumothorax. No acute osseous findings are evident. IMPRESSION: Satisfactory position of the endotracheal and orogastric tubes. No acute cardiopulmonary process identified. Electronically Signed   By: Richardean Sale M.D.   On: 11/16/2018 14:06   Dg Abd Portable 1 View  Result Date: 11/16/2018 CLINICAL DATA:  Orogastric tube placement. EXAM: PORTABLE ABDOMEN - 1 VIEW COMPARISON:  None. FINDINGS: 1317 hours. Orogastric tube is looped in the proximal stomach. There is gas throughout the small and large bowel, but no significant bowel distension. There is no free intraperitoneal air. Surgical clips are present in the right upper quadrant of the abdomen. IMPRESSION: Orogastric tube is looped in the proximal stomach. Electronically Signed   By: Richardean Sale M.D.   On: 11/16/2018 14:05    Micro Results    Recent Results (from the past 240 hour(s))  SARS Coronavirus 2 [facility] order, Performed in [facility] hospital lab)     Status: None   Collection Time: 11/16/18  1:12 PM  Result Value Ref Range Status   SARS Coronavirus 2 NEGATIVE NEGATIVE Final    Comment: (NOTE) If result is NEGATIVE SARS-CoV-2 target nucleic acids are NOT DETECTED. The SARS-CoV-2 RNA is generally detectable in upper and lower  respiratory specimens during the acute phase of infection. The lowest  concentration of SARS-CoV-2 viral copies this assay can detect is 250  copies / mL. A negative result does not preclude SARS-CoV-2 infection  and should not be used as the sole basis for treatment or other  patient management decisions.  A negative result may occur with  improper specimen collection / handling, submission of specimen other  than nasopharyngeal swab, presence of viral mutation(s) within the  areas targeted by this assay, and inadequate number of viral  copies  (<250 copies / mL). A negative result must be combined with clinical  observations, patient history, and epidemiological information. If result is POSITIVE SARS-CoV-2 target nucleic acids are DETECTED. The SARS-CoV-2 RNA is generally detectable in upper and lower  respiratory specimens dur ing the acute phase of infection.  Positive  results are indicative of active infection with SARS-CoV-2.  Clinical  correlation with patient history and other diagnostic information is  necessary to determine patient infection status.  Positive results do  not rule out bacterial infection or co-infection with other viruses. If result is PRESUMPTIVE POSTIVE SARS-CoV-2 nucleic acids MAY BE PRESENT.   A presumptive positive result was obtained on the submitted specimen  and confirmed on repeat testing.  While 2019 novel coronavirus  (SARS-CoV-2) nucleic acids may be present in the submitted sample  additional confirmatory testing may be necessary for epidemiological  and / or clinical management purposes  to differentiate between  SARS-CoV-2 and other Sarbecovirus currently known to infect humans.  If clinically indicated additional testing with an alternate test  methodology [phone]) is advised. The SARS-CoV-2 RNA is generally  detectable in upper and lower respiratory sp ecimens during the acute  phase of infection. The expected result is Negative. Fact Sheet for Patients:  StrictlyIdeas.no Fact Sheet for Healthcare Providers: BankingDealers.co.za This test is not yet approved or cleared by the [place] FDA and has been authorized for detection and/or diagnosis of SARS-CoV-2 by FDA under an Emergency Use Authorization (EUA).  This EUA will remain in effect (meaning this test can be used) for the duration of the COVID-19 declaration under Section 564(b)(1) of the Act, 21 U.S.C. section 360bbb-3(b)(1), unless the authorization is terminated  or revoked sooner.  Performed at [facility], [place] [address] [place], [place] [address]   Urine culture     Status: None   Collection Time: 11/16/18  2:30 PM  Result Value Ref Range Status   Specimen Description URINE, RANDOM  Final   Special Requests NONE  Final   Culture   Final    NO GROWTH Performed at [facility], [place] [address], [place], [place] [address]    Report Status 11/17/2018 FINAL  Final  Blood Culture (routine x 2)     Status: None   Collection Time: 11/16/18  4:55 PM  Result Value Ref Range Status   Specimen Description BLOOD LEFT ANTECUBITAL  Final   Special Requests   Final    AEROBIC BOTTLE ONLY Blood Culture results may not be optimal due to an inadequate volume of blood received in culture bottles   Culture   Final    NO GROWTH 5 DAYS Performed at [facility], [place] [address], [place], [place] [address]    Report Status 11/21/2018 FINAL  Final  Blood Culture (routine x 2)     Status: None   Collection Time: 11/16/18  5:10 PM  Result Value Ref Range Status   Specimen Description BLOOD LEFT HAND  Final   Special Requests AEROBIC BOTTLE ONLY Blood Culture adequate volume  Final   Culture   Final    NO GROWTH 5 DAYS Performed at [facility], [place] [address] [place], [place] [address]    Report Status 11/21/2018 FINAL  Final  MRSA PCR Screening     Status: None   Collection Time: 11/18/18 12:44 AM  Result Value Ref Range Status   MRSA by PCR NEGATIVE NEGATIVE Final    Comment:        The GeneXpert MRSA Assay (FDA approved for NASAL specimens only), is one component of a comprehensive MRSA colonization surveillance program. It is not intended to diagnose MRSA infection nor to guide or monitor treatment for MRSA infections. Performed at [facility], [place] [address] [place], [place] [address]        Today   Subjective    Stephen Grimes today has no new concerns, no fevers no chills, ambulating without chest  pains and No significant dyspnea on exertion, no dizziness, no hypoxia  Tolerating soft diet well          Patient has been seen and examined prior to discharge   Objective   Blood pressure 107/68, pulse 71, temperature (!) 97.4 F (36.3 C), temperature source Oral, resp. rate 16, height 5\' 7"  (1.702 m), weight 62.4 kg, SpO2 100 %.   Intake/Output Summary (Last 24 hours) at 11/23/2018 1451 Last data filed at 11/23/2018 1000 Gross per 24 hour  Intake 200 ml  Output 401 ml  Net -201 ml    Exam Gen:- Awake Alert, in no acute distress HEENT:- [place].AT, No sclera icterus Neck-Supple Neck,No JVD,.  Lungs-improving air movement, no wheezing CV- S1, S2 normal, regular  Abd-  +ve B.Sounds, Abd Soft, No tenderness,    Extremity/Skin:-trace pitting edema, pedal pulses present  Psych-affect is appropriate, oriented x3 Neuro-generalized weakness, but no new focal deficits, no tremors   Data Review   CBC w Diff:  Lab Results  Component Value Date   WBC 9.3 11/23/2018   HGB 8.9 (L) 11/23/2018   HCT 28.6 (L) 11/23/2018   PLT 165 11/23/2018   LYMPHOPCT 25 11/16/2018   MONOPCT 12 11/16/2018  EOSPCT 1 11/16/2018   BASOPCT 0 11/16/2018    CMP:  Lab Results  Component Value Date   NA 136 11/22/2018   K 5.3 (H) 11/22/2018   CL 104 11/22/2018   CO2 22 11/22/2018   BUN 22 11/22/2018   CREATININE 1.29 (H) 11/22/2018   PROT 5.1 (L) 11/20/2018   ALBUMIN 2.2 (L) 11/20/2018   BILITOT 0.8 11/20/2018   ALKPHOS 121 11/20/2018   AST 71 (H) 11/20/2018   ALT 34 11/20/2018  .   Total Discharge time is about 33 minutes  Roxan Hockey M.D on 11/23/2018 at 2:51 PM  Go to [email or web address] -  for contact info  [facility] - Office  [phone]

## 2018-11-25 ENCOUNTER — Other Ambulatory Visit: Payer: Self-pay

## 2018-11-25 ENCOUNTER — Encounter: Payer: Self-pay | Admitting: Internal Medicine

## 2018-11-25 ENCOUNTER — Other Ambulatory Visit: Payer: Medicare Other | Admitting: Internal Medicine

## 2018-11-25 ENCOUNTER — Encounter: Payer: Self-pay | Admitting: Nurse Practitioner

## 2018-11-25 DIAGNOSIS — Z515 Encounter for palliative care: Secondary | ICD-10-CM

## 2018-11-25 NOTE — Unmapped (Signed)
No answer, left message to return call for medication/allergy review and travel screening.

## 2018-11-25 NOTE — Progress Notes (Signed)
May 5th, 2020 [place] Note Telephone: [phone]  Fax: [phone]  PATIENT NAME: Stephen Grimes DOB: 1950/11/29 MRN: [id number] [address] [place] [address], Phone: [phone], [phone] (may be spouse's M)  Email is [email or web address]  PRIMARY CARE PROVIDER:   Seward Carol, MD  REFERRING PROVIDER:  Seward Carol, MD [address] [place], [place] [address]   Stephen York Brower FNP (Hematology and Oncology [facility] [phone])  RESPONSIBLE PARTY:   Contact is Niece Stephen Grimes at [phone]; e-mail: [email or web address].  (spouse) Stephen Grimes   RECOMMENDATIONS and PLAN:  1. Functional decline r/t recent hospitalizations and comorbid illnesses:  Patient has been making good strides s/p recent hospitalization (during which he was admitted unresponsive and was on the ventilator). He has been tucked into [facility] (SN/OT/PT/SW) and they had their initial visit today. Patient is ambulating up and about with just use of his cane. He is back to his cognitive baseline, with a good grasp of his medical condition and management of his affairs. He has a good appetite and adequate oral intake (without nausea), and is supplementing his diet with Ensure BID. His current weight is 120 lbs. At a height of 5'7" his BMI is 18.8 kg/m2. Previous weight loss/nausea attributed to hospitalization few months earlier (pneumonia), recent   2. Care giver supports:  [place] home health services in place. Resides with wife Stephen Grimes (has mild cognitive deficits). Many involved family members who are providing 24/7 support in the home, and plan to continue doing so as needed.   3. Goals of Care:  During recent hospitalization, patient was a DNR/DNI, reflecting his deconditioned state and upon the advice of his medical care providers at the time. However, patient feels that he has markedly improved since  hospitalization, and subsequently wishes to have full resusitative efforts in the event of cardiopulmonary arrest. During recent hospitalization, he was found to have Takotsubo cardiomyopathy (EF 15-20%; medical management asa/plavix/metoprolol), and acute on chronic kidney disease IV r/t hypovolemia; Cr peak of 3.46; down to 1.29 at time of discharge). He wonders to what extent the cardiomyopathy may improve over time, and if he can safety resume his treatment for metastatic renal cancer (oral agents currently on hold).        -f/u with Stephen Grimes oncology team; anticipates tele-health visit within the next few days Stephen Millin FNP). Patient is hoping to establish with a cardiologist at [facility]; he plans to follow NP Grimes's guidance on this. He is hoping to keep his providers [facility] based, to facilitate coordination of care.  4. Advanced Care Directives: As above; currently full code.  5. Follow up: will call the home and schedule a NP tele-health of home visit, week of May 24th.  I spent 60 minutes providing this consultation,  from 11am to noon. More than 50% of the time in this consultation was spent coordinating communication.   HISTORY OF PRESENT ILLNESS:  [name] is a 68 y.o. male with h/o  metastatic (retroperitoneal lymph nodes; small decrease in size by CT 09/2018, c/w CT 08/2017) renal carcinoma (R nephrectomy),OSA, HTN, COPD,hypothyroidism,alcoholic cirrhosis/ encephalopathy (last ETOH use 5 years ago). Patient is s/p recent hospitalization 4/26-11/23/2018, where he was treated for hypoxic respiratory failure (intubation; hypoxia completely resolved by time of discharge), encephalopathy/unresponsive episode (resolved). He was found to have Takotsubo cardiomyopathy (EF 15-20%; medical management asa/plavix/metoprolol), and acute on chronic kidney disease IV r/t hypovolemia; Cr  peak of 3.46; down to 1.29 at time of discharge). Palliative Care was asked to help address goals  of care.   CODE STATUS: Full Code  PPS: 60% (improved from 30% just before recent hospital discharge  HOSPICE ELIGIBILITY/DIAGNOSIS: TBD  PAST MEDICAL HISTORY:  Past Medical History:  Diagnosis Date  . Alcoholic cirrhosis ([place])   . Cancer [facility])    kidney cancer  . Cirrhosis ([place])   . COPD (chronic obstructive pulmonary disease) ([place])   . Coronary artery disease   . HTN (hypertension)   . Hypertension   . Metastatic renal cell carcinoma ([place])   . Renal cancer ([place])   . Renal disorder   . Renal impairment   . Sleep apnea   . Thrombocytopathia ([place])   . Thrombocytopenia ([place]) 12/11/2011  . Thyroid disease     SOCIAL HX:  Social History   Tobacco Use  . Smoking status: Never Smoker  . Smokeless tobacco: Never Used  Substance Use Topics  . Alcohol use: No    ALLERGIES: No Known Allergies   PERTINENT MEDICATIONS:  Outpatient Encounter Medications as of 11/25/2018  Medication Sig  . aspirin 81 MG chewable tablet Chew 1 tablet (81 mg total) by mouth daily with breakfast.  . chlorproMAZINE (THORAZINE) 25 MG tablet Take 1 tablet (25 mg total) by mouth 3 (three) times daily as needed for hiccoughs. (Patient taking differently: Take 25 mg by mouth daily. Once a day for nausea)  . clopidogrel (PLAVIX) 75 MG tablet Take 1 tablet (75 mg total) by mouth daily.  . feeding supplement, ENSURE ENLIVE, (ENSURE ENLIVE) LIQD Take 237 mLs by mouth 3 (three) times daily between meals. (Patient taking differently: Take 237 mLs by mouth 2 (two) times daily between meals. )  . lactulose (CHRONULAC) 10 GM/15ML solution Place 30 mLs (20 g total) into feeding tube 2 (two) times daily. (Patient taking differently: Take 20 g by mouth 2 (two) times daily. 15 ml bid)  . levothyroxine (SYNTHROID) 100 MCG tablet Take 1 tablet (100 mcg total) by mouth daily before breakfast.  . losartan (COZAAR) 25 MG tablet Take 25 mg by mouth daily.  . metoprolol tartrate (LOPRESSOR) 25 MG tablet Take 0.5 tablets (12.5 mg  total) by mouth 2 (two) times daily.  Marland Kitchen omeprazole (PRILOSEC) 20 MG capsule TAKE 1 CAPSULE BY MOUTH ONCE DAILY  . ondansetron (ZOFRAN-ODT) 8 MG disintegrating tablet Take 8 mg by mouth 3 (three) times daily as needed for nausea/vomiting.  . ondansetron (ZOFRAN-ODT) 8 MG disintegrating tablet Take 8 mg by mouth 2 (two) times daily as needed for nausea/vomiting.  . rifaximin (XIFAXAN) 550 MG TABS tablet Take 1 tablet (550 mg total) by mouth 2 (two) times daily.  Marland Kitchen thiamine 100 MG tablet Take 1 tablet (100 mg total) by mouth daily.  . urea (CARMOL) 40 % CREA Apply 1 application topically 2 (two) times daily. Apply to sore areas on hands and feet  . AFINITOR 5 MG tablet Take 5 mg by mouth daily. As of 5/5 patient told to hold pnd oncology review  . amLODipine (NORVASC) 2.5 MG tablet Take 1 tablet (2.5 mg total) by mouth daily. (Patient not taking: Reported on 11/25/2018)  . LENVIMA, 18 MG DAILY DOSE, 10 MG & 2 x 4 MG capsule Take by mouth See admin instructions. Take  One 10 mg tablet and two 4 mg tablets daily. * as of 11/25/18, on hold pnd oncology review  . [DISCONTINUED] acetaminophen (TYLENOL) 325 MG tablet Take 2 tablets (650  mg total) by mouth every 4 (four) hours as needed for mild pain, fever or headache (temp > 101.5).  . [DISCONTINUED] cholestyramine (QUESTRAN) 4 G packet Take 4 g by mouth 2 (two) times daily.  . [DISCONTINUED] levothyroxine (SYNTHROID, LEVOTHROID) 112 MCG tablet Take 112 mcg by mouth daily before breakfast.   . [DISCONTINUED] losartan (COZAAR) 25 MG tablet Take 1 tablet (25 mg total) by mouth daily.  . [DISCONTINUED] multivitamin (RENA-VIT) TABS tablet Take 1 tablet daily  . [DISCONTINUED] multivitamin (RENA-VIT) TABS tablet Take 1 tablet by mouth daily.  . [DISCONTINUED] omeprazole (PRILOSEC) 20 MG capsule Take 20 mg by mouth daily.  . [DISCONTINUED] urea (CARMOL) 40 % CREA Apply 1 application topically See admin instructions. Apply to sore areas on hands and feet   No  facility-administered encounter medications on file as of 11/25/2018.     PHYSICAL EXAM:   Limited 2/2 tele-health visit Patient is A &O x4; pleasantly conversant. Slender. Spouse and multi family members in attendance.  Easily conversant without dyspnea. Observed to transfer and ambulate independently with cane assist. No extremity / joint deformities or edema noted. No skin breakdown or rashes.  Julianne Handler, NP

## 2018-11-26 ENCOUNTER — Institutional Professional Consult (permissible substitution): Admit: 2018-11-26 | Discharge: 2018-11-27 | Payer: MEDICARE | Attending: Family | Primary: Family

## 2018-11-26 ENCOUNTER — Encounter: Payer: Self-pay | Admitting: Internal Medicine

## 2018-11-27 NOTE — Unmapped (Signed)
Plan:  Follow up with patient via phone in 2 weeks to assess recovery and discuss GOC

## 2018-11-27 NOTE — Unmapped (Signed)
Genitourinary Oncology Clinic Follow-Up Note     PHONE ENCOUNTER  Phone encounter visit was necessary based on COVID 1 outbreak to ensure patient safety    I spent 40 minutes on the phone with the patient. I spent an additional 15 minutes on pre- and post-visit activities.     The patient was physically located in [place] or a state in which I am permitted to provide care. The patient understood that s/he may incur co-pays and cost sharing, and agreed to the telemedicine visit. The visit was completed via phone and/or video, which was appropriate and reasonable under the circumstances given the patient's presentation at the time.    The patient has been advised of the potential risks and limitations of this mode of treatment (including, but not limited to, the absence of in-person examination) and has agreed to be treated using telemedicine. The patient's/patient's family's questions regarding telemedicine have been answered.     If the phone/video visit was completed in an ambulatory setting, the patient has also been advised to contact their provider???s office for worsening conditions, and seek emergency medical treatment and/or call 911 if the patient deems either necessary.    Patient Location: home  Contact Number: [phone]       Patient Name: Sergio Horton  Encounter Date: 11/26/2018    Referring Physician: Renford Horton, Sergio Horton, [facility])  Urologist: Sergio Horton    Assessment/Plan:    68 y.o.M with alcoholic cirrhosis, HTN, and h/o pT3a ccRCC s/p 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 He has progressed through Sunitnib, Nivolumab + Ipilimumab via OMNIVORE clinical trial, Cabozantinib, and was on treatment with Lenvatinib + everolimus, until 11/17/2018, when he was taken to his local ED for acute MS changes, found to have STEMI.       1. ccRCC: on treatment with Lenvtinib + Everolimus (1/2/20120- 11/17/2018)    ?? Previous Treatments: Sunitnib (07/27/2016-12/03/2016), Nivolumab + Ipilimumab + Nivo via OMNIVORE clinical trial (01/21/2017-05/29/2017), and Cabozantinib (07/08/2017-07/22/2018)  ?? Tempus testing results below   ?? Restarted Lenvatinib + everolimus on 10/01/2018 after holding x 10days 2/2 PNA requiring hospitalization. Currently discontinued 2/2 recent STEMI. No plans to restart TKI given cardiac decompensation  GOC Discussion: We had a lengthy discussion today regarding the patient's current state of health. Given recent hospitalization for a significant STEMI, and now EF 20%, he is no longer able to continue on treatment with lenvatinib+ everoliums at this time. I explained that unfortunately, his cardiac status precludes him from taking any TKI, and he has already seen immunotherapy for the treatment of his kidney cancer, leaving him with very limited options. Furthermore, his physical status is greatly compromised at this time and I would not recommend cancer directed treatment.   We discussed long and short term goals in regards to transitioning to best supportive care. He understands that his body is not strong enough to undergo any additional cancer directed treatment at this time, and that he is very fortunate to have survived this most recent cardiac event. In the event that he were unable to speak for himself he did state, that he would like his Niece, Sergio Horton [phone]) to be his contact person, given his wife Sergio Horton has mild cognitive deficits related to her dementia. He initially was a DNR/DNI during his hospitalization, but he has rescinded this at this time, given he was able to be discharged home. I discussed with the [name], that although he is recovering, he would unlikely survive any additional sentential events, and resuscitative  measures may be more harmful than beneficial. He still wishes to remain a full code. We will continue to discuss and to note, he has palliative home services in place.      # STEMI with Takotsubo syndrome- hospitalized on 4/27- 5/3- notes in Care everywhere. COVID ruled out, patient with elevated Trops and EKG changes. ECHO reported EF 15- 20%. Patient was discharged home on 5/3, with ASA, metoprolol, and  Lisinopril. He continues to recover.     # Cirrhosis: Has intermittent baseline leukopenia and thrombocytopia, although reports no episodes of decompensated cirrhosis and currently in normal range. He had a mild elevation at his last visit but not significant.   ?? Avoids tylenol  ?? Denies current alcohol use    # HTN: better controled. Currently on amlodipine 10mg  and losartan 25mg .  ?? Home readings 130-140's/80's  ?? Discontinued lisinopril previously due to cough     # Asymmetric LE edema: No DVT per recent doppler.     # Elevated TSH: On synthroid, follows with PCP    # Ventral hernia: easily reduces. Advised wearing abdominal binder if is becomes bothersome. He has met with  Dr. Carlynn Horton and will follow up with her after imaging has been completed in October. Given disease growth,and recent cardiac events,  I would depolarize this at this time.       # Baseline Elevated sCr: CTM.     # GERD: prilosec    # Health Maintenance: Influenza given 04/2018    #. PPE - no blisters today  ?? Stop urea cream, as no cancer directed treatment at this time    # Diarrhea: resolved at this time    #  Decreasing plt count last reading of 136 on 08/20/17.  Stable on 4/13 labs; 232    # hx PNA in Feb 2020 resolved- patchy opacity remains on CT, but smaller than noted on previous imaging.    # malnutrition with associated wt loss: drinking supplements, intaking small, frequent meals.     #nausea with intermittent associated emesis: currently not an issue as he is not on cancer directed treatment        Plan Summary:   ?? No further cancer directed treatment at this time given functional decline  ?? Will continue to provide best supportive care and discuss GOC  ?? He follows with local cardiology and PCP  ?? Appreciate palliative care recs        History of Present Illness: Sergio Horton is a 68 y.o. male with h/o right nephrectomy and caval thrombectomy for ccRCC who is seen in follow-up for met ccRCC.    He originally presented in 2015 with a large right renal mass. He underwent nephrectomy in 06/2014 and pathology showed a 11.5cm grade 2 ccRCC with invasion into the perirenal adipose tissue (pT3a). He also had a retrocaval mass resected that also was ccRCC. Post-op course was complicated by respiratory failure requiring ECMO. He was in the hospital for a month after his surgery. Routine imaging demonstrated likely local recurrence and biopsy confirmed RCC. He initiated sunitinib on 07/27/16 and dose reduced 08/22/16 due to neutropenia, restarted 09/03/16, the dose reduced again for HFS. CT on 12/03/16 showed POD and sunitinib was stopped.  He transitioned to treatment via OMNIVORE trial (01/07/2017-06/24/2017), that utilized single agent nivolumab, until POD was noted, then he received 2 cycles of Ipi + Nivo. Repeat imaging reported POD.     Given POD on IO, he started Cabozantinib on 07/03/2017. To  note he was started on 40mg  given significant liver disease, and was dose reduced to 20mg  after approx 6mon on treatment 2/2 persistent SE's including PPE, HTN, fatigue, appetite loss, and intermittent elevation in LFTs. He did well on 20mg , but most recent imaging from 06/2018 reported overall disease growth.     Lenvtinib + everolimus was recommended after imaging review in tumor board. He was able to start this regimen 07/24/2018, but has discontinued given recent STEMI, with no plan to restart    To note, he was hospitalized from 4/27-11/23/2018 at [facility], where he was found to have a STEMI 2/2 Takotsubo cardiomyopathy. He was intubated for several days, but was able to extubated and discharged home by 5/3. Palliative care and home health continue to assist and follow.        Interval History:  Mr. Hanner was assessed via phone today and he reports the following since his discharge from the hospital     Fatigue and feeling cold all the time, ambulating with a cane. He states he feels he is recovering well from his hospitalization, but remains very weak    Bilateral swollen feet, likely multifactorial. He is wearing compression hose/socks    Appetite is poor, trying to eat small frequent meals    No new pain    Depressed mood    Mild SOB with exertion - likely related to decreased EF and physical decline          Past Medical History:  Past Medical History:   Diagnosis Date   ??? Cirrhosis of liver (CMS-HCC)    ??? Hypertension    ??? Hypothyroidism    ??? Renal cancer (CMS-HCC)    ??? Thrombocytopathia (CMS-HCC)    Alcoholic Cirrhosis - denies any ascites or h/o encephalopathy  HTN   No history of autoimmune disorder.    Medications:  Reviewed in EPIC  Allergies:  No Known Allergies    Social History:  Lives in [place] with wife. He has a h/o cirrhosis from alcohol use but does not currently use alcohol. No tobacco use. No other drugs. Works as a Financial risk analyst.    Review of Systems: A 10 system review was completed and was negative except per HPI.    Physical Exam:  VS from yesterday reviewed in Epic  ECOG PS 1  No physical exam- phone assessment    Labs: reviewed from OSH    Imaging:   ECHO 11/18/2018 OSH  EF 15-20%    Head CT 11/16/2018 OSH  Brain: No evidence of acute infarction, hemorrhage, hydrocephalus,  extra-axial collection or mass lesion/mass effect.      Pathology:  06/29/14:  Diagnosis:  A: ??Liver, segment 5, core needle biopsy  - Cirrhosis, etiology uncertain (see Light Microscopy) ??    B: ??Kidney and adrenal, right, radical nephrectomy, adrenalectomy, and caval  thrombectomy   Tumor histologic type/subtype: ??clear cell carcinoma  Sarcomatoid features:?????????? not identified   Histologic grade: Grade 2 (of 4) Fuhrman classification??????????  Tumor size: 11.5 cm diameter   Tumor focality: unifocal     Extent of invasion:??????????  ???? ?? Extra-capsular invasion into perirenal adipose tissue: not identified  ???? ?? Gerota's fascia: not involved   ???? ?? Renal sinus: not involved ??????????  ???? ?? Major veins (renal vein or segmental branches, IVC): involved (macro and  micro)  ???? ?? Ureter: not involved   ???? ?? Venous: ??involved   ???? ?? Lymphatic:??????????not involved   ???? ??   Histologic assessment  of surgical margins:??????????  ???? ?? Peri-nephric adipose tissue margin (partial nephrectomy only): negative   ???? ?? Gerota's fascia: negative   ???? ?? Renal vein margin:??????????no adherent carcinoma identified on initial or  recuts of en face margin (B2) ??  ???? ?? Ureter margin: ??negative   Adrenal gland:?????????? negative   Lymph nodes:??????????none received     Pathologic findings in non-neoplastic kidney: focal global glomerulosclerosis   AJCC Stage (renal primary): ??pT3b (intrahepatic caval involvement) ?? pNx ?? pMx    C: ??Mass, retrocaval, excision   - Renal clear cell carcinoma, partially encapsulated, presumed confined by  Gerota's fascia, extending to uninked tissue edges    06/27/16:   Right nephrectomy bed, core biopsy and touch preparation:  - Diagnostic of malignancy.  - Morphology consistent with recurrence of patient's known clear cell renal cell carcinoma (Grade 2 of 4 Fuhrman Nuclear Grade).     CT CAP 10/07/2018  small decrease in metastatic implants  LYMPH NODES: Extracapsular implant posterior to hepatic segment 6 measures 1.9 x 1.4 cm (1:42), previously 2.2 x 1.2 cm.  ??-Right paraspinal mass medial to the caudate lobe with indentation of the psoas muscle, intercostal muscle and paraspinal muscles, measures 6.3 x 1.8 cm (1:37), previously 6.8 x 2.9 cm.  ??-Implant within the right psoas muscle measures 1.7 x 1.4 cm (1:45), previously 1.8 x 2.2 cm.  ??-Aortocaval node measures 4.2 x 2.9 cm (1:34), previously 2.8 x 4.5 cm. There is mass effect upon the IVC, which is laterally displaced.    GEN O M I C     V A R I A N T S  Somatic - Potentially Actionable Variant Allele Fraction  PTPRD            p.E1458* Stop gain - LOF 50.2% AKT2  Copy number gain  CCND3 Copy number gain  VEGFA Copy number gain  Somatic - Biologically Relevant   ZFHX3            p.Q1181* Stop gain - LOF 34.8%  TFEB              Copy number gain  Germline - Pathogenic / Likely Pathogenic  No pathogenic variants were found in the limited set of genes on which we report  MSI stable

## 2018-12-04 NOTE — Unmapped (Signed)
This patient has been disenrolled from the [facility] specialty pharmacy services due to medication discontinuation resulting from progression of disease.    Kermit Balo  [facility] Specialty Pharmacist

## 2018-12-05 ENCOUNTER — Telehealth: Payer: Self-pay | Admitting: Internal Medicine

## 2018-12-05 NOTE — Telephone Encounter (Signed)
4pm: TC to patient in answer to his query how to obtain medical records from [facility], in answer to insurance needs.   I sent him the following e-mail to  [email or web address]  Hi Mr. Chestnutt, It was nice to have had the chance to have spoken to you.  Here is the link to the release of medical information form ("Patient Request for Access"): [email or web address]  Venida Jarvis out the form as indicated, and fax that form to [phone].  Also on that form, there is a hyperlink to establishing a "My Chart" account, But here it is as well: [email or web address]  If you have further questions, you can call [facility] at [phone] and I believe select option 5.  Hope you are continuing to do well!  Violeta Gelinas NP-C [phone]

## 2018-12-09 NOTE — Telephone Encounter (Signed)
12/05/2018 4:15pm: Hi Mr. Allemand, It was nice to have had the chance to have spoken to you 12 Here is the link to the release of medical information form ("Patient Request for Access"): [email or web address] Venida Jarvis out the form as indicated, and fax that form to [phone]. Also on that form, there is a hyperlink to establishing a "My Chart" account, But here it is as well: [email or web address] If you have further questions, you can call [facility] at [phone] and I believe select option 5. Hope you are continuing to do well! Violeta Gelinas NP-C

## 2018-12-10 ENCOUNTER — Institutional Professional Consult (permissible substitution): Admit: 2018-12-10 | Discharge: 2018-12-11 | Payer: MEDICARE | Attending: Family | Primary: Family

## 2018-12-10 DIAGNOSIS — I5021 Acute systolic (congestive) heart failure: Secondary | ICD-10-CM

## 2018-12-10 DIAGNOSIS — C649 Malignant neoplasm of unspecified kidney, except renal pelvis: Principal | ICD-10-CM

## 2019-01-07 ENCOUNTER — Other Ambulatory Visit: Payer: Self-pay | Admitting: Gastroenterology

## 2019-01-07 DIAGNOSIS — K7030 Alcoholic cirrhosis of liver without ascites: Secondary | ICD-10-CM

## 2019-01-07 DIAGNOSIS — R1084 Generalized abdominal pain: Secondary | ICD-10-CM

## 2019-01-08 ENCOUNTER — Institutional Professional Consult (permissible substitution): Admit: 2019-01-08 | Discharge: 2019-01-09 | Payer: MEDICARE | Attending: Family | Primary: Family

## 2019-01-08 DIAGNOSIS — C779 Secondary and unspecified malignant neoplasm of lymph node, unspecified: Principal | ICD-10-CM

## 2019-01-08 DIAGNOSIS — C649 Malignant neoplasm of unspecified kidney, except renal pelvis: Secondary | ICD-10-CM

## 2019-01-12 ENCOUNTER — Ambulatory Visit
Admission: RE | Admit: 2019-01-12 | Discharge: 2019-01-12 | Disposition: A | Discharge status: Home / Self Care | Payer: Medicare Other | Source: Ambulatory Visit | Attending: Gastroenterology | Admitting: Gastroenterology

## 2019-01-12 ENCOUNTER — Other Ambulatory Visit: Payer: Self-pay

## 2019-01-12 DIAGNOSIS — K7030 Alcoholic cirrhosis of liver without ascites: Secondary | ICD-10-CM

## 2019-01-12 DIAGNOSIS — R1084 Generalized abdominal pain: Secondary | ICD-10-CM

## 2019-01-12 MED ORDER — IOPAMIDOL (ISOVUE-300) INJECTION 61%
100.0000 mL | Freq: Once | INTRAVENOUS | Status: AC | PRN
  Administered 2019-01-12: 100 mL via INTRAVENOUS

## 2019-01-14 ENCOUNTER — Telehealth: Payer: Self-pay

## 2019-01-14 ENCOUNTER — Telehealth: Payer: Medicare Other | Admitting: Cardiovascular Disease

## 2019-01-14 ENCOUNTER — Other Ambulatory Visit: Payer: Self-pay

## 2019-01-14 NOTE — Telephone Encounter (Signed)
YOUR CARDIOLOGY TEAM HAS ARRANGED FOR AN E-VISIT FOR YOUR APPOINTMENT - PLEASE REVIEW IMPORTANT INFORMATION BELOW SEVERAL DAYS PRIOR TO YOUR APPOINTMENT  Due to the recent COVID-19 pandemic, we are transitioning in-person office visits to tele-medicine visits in an effort to decrease unnecessary exposure to our patients, their families, and staff. These visits are billed to your insurance just like a normal visit is. We also encourage you to sign up for MyChart if you have not already done so. You will need a smartphone if possible. For patients that do not have this, we can still complete the visit using a regular telephone but do prefer a smartphone to enable video when possible. You may have a family member that lives with you that can help. If possible, we also ask that you have a blood pressure cuff and scale at home to measure your blood pressure, heart rate and weight prior to your scheduled appointment. Patients with clinical needs that need an in-person evaluation and testing will still be able to come to the office if absolutely necessary. If you have any questions, feel free to call our office.     YOUR PROVIDER WILL BE USING THE FOLLOWING PLATFORM TO COMPLETE YOUR VISIT: Doxy.Me  . IF USING MYCHART - How to Download the MyChart App to Your SmartPhone   - If Apple, go to App Store and type in MyChart in the search bar and download the app. If Android, ask patient to go to Google Play Store and type in MyChart in the search bar and download the app. The app is free but as with any other app downloads, your phone may require you to verify saved payment information or Apple/Android password.  - You will need to then log into the app with your MyChart username and password, and select [facility] as your healthcare provider to link the account.  - When it is time for your visit, go to the MyChart app, find appointments, and click Begin Video Visit. Be sure to Select Allow for your device to  access the Microphone and Camera for your visit. You will then be connected, and your provider will be with you shortly.  **If you have any issues connecting or need assistance, please contact MyChart service desk [phone] ([phone])**  **If using a computer, in order to ensure the best quality for your visit, you will need to use either of the following Internet Browsers: Google Chrome or Microsoft Edge**  . IF USING DOXIMITY or DOXY.ME - The staff will give you instructions on receiving your link to join the meeting the day of your visit.      2-3 DAYS BEFORE YOUR APPOINTMENT  You will receive a telephone call from one of our HeartCare team members - your caller ID may say "Unknown caller." If this is a video visit, we will walk you through how to get the video launched on your phone. We will remind you check your blood pressure, heart rate and weight prior to your scheduled appointment. If you have an Apple Watch or Kardia, please upload any pertinent ECG strips the day before or morning of your appointment to MyChart. Our staff will also make sure you have reviewed the consent and agree to move forward with your scheduled tele-health visit.     THE DAY OF YOUR APPOINTMENT  Approximately 15 minutes prior to your scheduled appointment, you will receive a telephone call from one of HeartCare team - your caller ID may say "Unknown caller."    Our staff will confirm medications, vital signs for the day and any symptoms you may be experiencing. Please have this information available prior to the time of visit start. It may also be helpful for you to have a pad of paper and pen handy for any instructions given during your visit. They will also walk you through joining the smartphone meeting if this is a video visit.    CONSENT FOR TELE-HEALTH VISIT - PLEASE REVIEW  I hereby voluntarily request, consent and authorize [facility] and its employed or contracted physicians, physician  assistants, nurse practitioners or other licensed health care professionals (the Practitioner), to provide me with telemedicine health care services (the "Services") as deemed necessary by the treating Practitioner. I acknowledge and consent to receive the Services by the Practitioner via telemedicine. I understand that the telemedicine visit will involve communicating with the Practitioner through live audiovisual communication technology and the disclosure of certain medical information by electronic transmission. I acknowledge that I have been given the opportunity to request an in-person assessment or other available alternative prior to the telemedicine visit and am voluntarily participating in the telemedicine visit.  I understand that I have the right to withhold or withdraw my consent to the use of telemedicine in the course of my care at any time, without affecting my right to future care or treatment, and that the Practitioner or I may terminate the telemedicine visit at any time. I understand that I have the right to inspect all information obtained and/or recorded in the course of the telemedicine visit and may receive copies of available information for a reasonable fee.  I understand that some of the potential risks of receiving the Services via telemedicine include:  . Delay or interruption in medical evaluation due to technological equipment failure or disruption; . Information transmitted may not be sufficient (e.g. poor resolution of images) to allow for appropriate medical decision making by the Practitioner; and/or  . In rare instances, security protocols could fail, causing a breach of personal health information.  Furthermore, I acknowledge that it is my responsibility to provide information about my medical history, conditions and care that is complete and accurate to the best of my ability. I acknowledge that Practitioner's advice, recommendations, and/or decision may be based on  factors not within their control, such as incomplete or inaccurate data provided by me or distortions of diagnostic images or specimens that may result from electronic transmissions. I understand that the practice of medicine is not an exact science and that Practitioner makes no warranties or guarantees regarding treatment outcomes. I acknowledge that I will receive a copy of this consent concurrently upon execution via email to the email address I last provided but may also request a printed copy by calling the office of [facility].    I understand that my insurance will be billed for this visit.   I have read or had this consent read to me. . I understand the contents of this consent, which adequately explains the benefits and risks of the Services being provided via telemedicine.  . I have been provided ample opportunity to ask questions regarding this consent and the Services and have had my questions answered to my satisfaction. . I give my informed consent for the services to be provided through the use of telemedicine in my medical care  By participating in this telemedicine visit I agree to the above.  

## 2019-01-19 ENCOUNTER — Other Ambulatory Visit: Payer: Self-pay

## 2019-01-19 ENCOUNTER — Telehealth (INDEPENDENT_AMBULATORY_CARE_PROVIDER_SITE_OTHER): Payer: Medicare Other | Admitting: Cardiology

## 2019-01-19 VITALS — BP 126/60 | HR 60 | Ht 67.0 in | Wt 128.0 lb

## 2019-01-19 DIAGNOSIS — I5021 Acute systolic (congestive) heart failure: Secondary | ICD-10-CM | POA: Diagnosis not present

## 2019-01-19 DIAGNOSIS — C649 Malignant neoplasm of unspecified kidney, except renal pelvis: Secondary | ICD-10-CM

## 2019-01-19 DIAGNOSIS — C779 Secondary and unspecified malignant neoplasm of lymph node, unspecified: Secondary | ICD-10-CM

## 2019-01-19 DIAGNOSIS — N179 Acute kidney failure, unspecified: Secondary | ICD-10-CM

## 2019-01-19 NOTE — Patient Instructions (Signed)
Medication Instructions:  The current medical regimen is effective;  continue present plan and medications.  If you need a refill on your cardiac medications before your next appointment, please call your pharmacy.   Testing/Procedures: Your physician has requested that you have an echocardiogram. Echocardiography is a painless test that uses sound waves to create images of your heart. It provides your doctor with information about the size and shape of your heart and how well your heart's chambers and valves are working. This procedure takes approximately one hour. There are no restrictions for this procedure.  You will be contacted to be scheduled for this testing which will be completed at out [address], [place], [place] office.  Follow-Up:  Follow up in 2 months with Dr Marlou Porch.  Thank you for choosing [facility]!!

## 2019-01-19 NOTE — Progress Notes (Signed)
Virtual Visit via Video Note   This visit type was conducted due to national recommendations for restrictions regarding the COVID-19 Pandemic (e.g. social distancing) in an effort to limit this patient's exposure and mitigate transmission in our community.  Due to his co-morbid illnesses, this patient is at least at moderate risk for complications without adequate follow up.  This format is felt to be most appropriate for this patient at this time.  All issues noted in this document were discussed and addressed.  A limited physical exam was performed with this format.  Please refer to the patient's chart for his consent to telehealth for [facility].   Date:  01/19/2019   ID:  Stephen RACICOT, DOB 10-26-1950, MRN [id number]  Patient Location: Home Provider Location: Home  PCP:  Seward Carol, MD  Cardiologist:  No primary care provider on file. Annamarie Yamaguchi Electrophysiologist:  None   Evaluation Performed:  Follow-Up Visit  Chief Complaint: Heart failure follow-up  History of Present Illness:    Stephen Grimes is a 68 y.o. male with metastatic renal cell Ca, alcoholic cirrhosis, anterior myocardial infarction troponin peaked at 65 with new ischemic cardiomyopathy EF 20% in April 2020 in the setting of ICU stay with abnormal EKG showing diffuse ST segment changes or myocardial infarction of the anterior wall.  Peak troponin was 16.  Echocardiogram could have been compatible as well with Takotsubo cardiomyopathy.  Overall he has made a remarkable recovery.  I am talking with him currently as he is walking around outside, complete improvement.  He denies any shortness of breath no orthopnea no chest discomfort with activity.  He has a Administrator visit coming up to talk about his metastatic renal cell carcinoma and the potential chance for resuming treatments.  He also has an upcoming palliative care discussion as well.  Vomits at times with Lactulose  The patient does not have symptoms  concerning for COVID-19 infection (fever, chills, cough, or new shortness of breath).    Past Medical History:  Diagnosis Date  . Alcoholic cirrhosis ([place])   . Cancer [facility])    kidney cancer  . Cirrhosis ([place])   . COPD (chronic obstructive pulmonary disease) ([place])   . Coronary artery disease   . HTN (hypertension)   . Hypertension   . Metastatic renal cell carcinoma ([place])   . Renal cancer ([place])   . Renal disorder   . Renal impairment   . Sleep apnea   . Thrombocytopathia ([place])   . Thrombocytopenia ([place]) 12/11/2011  . Thyroid disease    Past Surgical History:  Procedure Laterality Date  . kidney removed Right   . TESTICLE REMOVAL       Current Meds  Medication Sig  . amLODipine (NORVASC) 2.5 MG tablet Take 1 tablet (2.5 mg total) by mouth daily.  Marland Kitchen aspirin 81 MG chewable tablet Chew 1 tablet (81 mg total) by mouth daily with breakfast.  . clopidogrel (PLAVIX) 75 MG tablet Take 1 tablet (75 mg total) by mouth daily.  . feeding supplement, ENSURE ENLIVE, (ENSURE ENLIVE) LIQD Take 237 mLs by mouth 3 (three) times daily between meals. (Patient taking differently: Take 237 mLs by mouth 2 (two) times daily between meals. )  . furosemide (LASIX) 40 MG tablet Take 1 tablet by mouth daily.  Marland Kitchen lactulose (CHRONULAC) 10 GM/15ML solution Place 30 mLs (20 g total) into feeding tube 2 (two) times daily. (Patient taking differently: Take 20 g by mouth 2 (two) times daily. 15 ml bid)  .  levothyroxine (SYNTHROID) 100 MCG tablet Take 1 tablet (100 mcg total) by mouth daily before breakfast.  . omeprazole (PRILOSEC) 20 MG capsule TAKE 1 CAPSULE BY MOUTH ONCE DAILY  . ondansetron (ZOFRAN-ODT) 8 MG disintegrating tablet Take 8 mg by mouth 3 (three) times daily as needed for nausea/vomiting.  . rifaximin (XIFAXAN) 550 MG TABS tablet Take 1 tablet (550 mg total) by mouth 2 (two) times daily.  Marland Kitchen spironolactone (ALDACTONE) 25 MG tablet Take 1 tablet by mouth daily.  Marland Kitchen thiamine 100 MG tablet Take 1 tablet  (100 mg total) by mouth daily.  . urea (CARMOL) 40 % CREA Apply 1 application topically 2 (two) times daily. Apply to sore areas on hands and feet     Allergies:   Patient has no known allergies.   Social History   Tobacco Use  . Smoking status: Never Smoker  . Smokeless tobacco: Never Used  Substance Use Topics  . Alcohol use: No  . Drug use: Not on file     Family Hx: The patient's family history is not on file.  No early CAD  ROS:   Please see the history of present illness.     All other systems reviewed and are negative.   Prior CV studies:   The following studies were reviewed today:  11/17/18 ECHO: 1. The left ventricle has a visually estimated ejection fraction of 20%.The cavity size was normal. Left ventricular diastolic Doppler parameters are consistent with impaired relaxation. Akinesis of the mid to apical LV segments with normal function of the basilar LV segments. This is consistent with stress (Takotsubo) cardiomyopathy versus infarction in territory of large wrap-around LAD. 2. The right ventricle has normal systolic function. The cavity was normal. There is no increase in right ventricular wall thickness. 3. The aortic valve is tricuspid. No stenosis of the aortic valve. 4. The aortic root is normal in size and structure. 5. The inferior vena cava was normal in size with <50% respiratory variability. PA systolic pressure 26 mmHg. 6. No evidence of mitral valve stenosis. No significant regurgitation  Labs/Other Tests and Data Reviewed:    EKG:  11/17/2018 personally reviewed shows sinus rhythm Q waves anterior leads with subtle ST segment elevation in those leads as well.  Recent Labs: 11/20/2018: ALT 34 11/22/2018: BUN 22; Creatinine, Ser 1.29; Magnesium 1.9; Potassium 5.3; Sodium 136; TSH 106.553 11/23/2018: Hemoglobin 8.9; Platelets 165   Recent Lipid Panel No results found for: CHOL, TRIG, HDL, CHOLHDL, LDLCALC, LDLDIRECT  Wt Readings from Last 3  Encounters:  01/19/19 128 lb (58.1 kg)  11/23/18 137 lb 9.1 oz (62.4 kg)  09/12/18 139 lb (63 kg)     Objective:    Vital Signs:  BP 126/60   Pulse 60   Ht 5\' 7"  (1.702 m)   Wt 128 lb (58.1 kg)   BMI 20.05 kg/m    VITAL SIGNS:  reviewed GEN:  no acute distress EYES:  sclerae anicteric, EOMI - Extraocular Movements Intact RESPIRATORY:  normal respiratory effort, symmetric expansion CARDIOVASCULAR:  no peripheral edema SKIN:  no rash, lesions or ulcers. MUSCULOSKELETAL:  no obvious deformities. NEURO:  alert and oriented x 3, no obvious focal deficit PSYCH:  normal affect  ASSESSMENT & PLAN:    Dilated cardiomyopathy, possible Takotsubo EF 20% April 2020 - This was in the setting of severe encephalopathy, respiratory failure.  He has made a remarkable recovery.  No shortness of breath currently.  EKG changes were concerning for possible acute injury, however given  his remarkable recovery I wonder if this pattern was stress-induced cardiomyopathy. -We will go ahead and set him up for an echocardiogram to revisit his ejection fraction.  This may potentially help guide his therapy for renal cell carcinoma which is being treated at [facility]. -His blood pressures previously did not tolerate beta-blocker or ACE inhibitor.  He is on low-dose spironolactone given his underlying hepatic disease.  Remember, prior echocardiogram at [facility] showed EF of 65% in 2018.  Acute respiratory failure -Resolved.  Chronic kidney disease stage IV - Avoid NSAIDs.  Creatinine 1.29 at discharge.  Metastatic renal cell carcinoma - Previously noted periaortic lymph nodes, worsening of disease.  T[facility].  Hepatic encephalopathy/cirrhosis - Taking lactulose but sometimes vomits with this.    He did ask about palliative care and I did state that it would not be a bad idea for him to have this discussion while he is in his current state, feeling better so that he and his family understand what his wishes  are.   COVID-19 Education: The signs and symptoms of COVID-19 were discussed with the patient and how to seek care for testing (follow up with PCP or arrange E-visit).  The importance of social distancing was discussed today.  Time:   Today, I have spent 25 minutes with the patient with telehealth technology discussing the above problems.     Medication Adjustments/Labs and Tests Ordered: Current medicines are reviewed at length with the patient today.  Concerns regarding medicines are outlined above.   Tests Ordered: Orders Placed This Encounter  Procedures  . ECHOCARDIOGRAM COMPLETE    Medication Changes: No orders of the defined types were placed in this encounter.   Follow Up:  Virtual Visit or In Person in 2 month(s)  Signed, Candee Furbish, MD  01/19/2019 10:32 AM    Stephen Grimes

## 2019-01-20 ENCOUNTER — Ambulatory Visit: Admit: 2019-01-20 | Discharge: 2019-01-21 | Payer: MEDICARE

## 2019-01-20 ENCOUNTER — Telehealth: Payer: Self-pay | Admitting: Internal Medicine

## 2019-01-20 NOTE — Telephone Encounter (Signed)
4pm: TC ([phone]) to patient to schedule f/u tele-health visit. I left a message with my call back information. Violeta Gelinas NP-C [phone]

## 2019-01-20 NOTE — Telephone Encounter (Signed)
4:50pm: patient returned TC; telehealth visit scheduled for Thurs July 2nd at [facility] NP-C  [phone]

## 2019-01-21 ENCOUNTER — Institutional Professional Consult (permissible substitution): Admit: 2019-01-21 | Discharge: 2019-01-22 | Payer: MEDICARE | Attending: Family | Primary: Family

## 2019-01-21 DIAGNOSIS — K7460 Unspecified cirrhosis of liver: Secondary | ICD-10-CM

## 2019-01-21 DIAGNOSIS — E039 Hypothyroidism, unspecified: Secondary | ICD-10-CM

## 2019-01-21 DIAGNOSIS — C649 Malignant neoplasm of unspecified kidney, except renal pelvis: Principal | ICD-10-CM

## 2019-01-21 DIAGNOSIS — D691 Qualitative platelet defects: Secondary | ICD-10-CM

## 2019-01-21 DIAGNOSIS — I5021 Acute systolic (congestive) heart failure: Secondary | ICD-10-CM

## 2019-01-22 ENCOUNTER — Other Ambulatory Visit: Payer: Self-pay

## 2019-01-22 ENCOUNTER — Encounter: Payer: Self-pay | Admitting: Internal Medicine

## 2019-01-22 ENCOUNTER — Other Ambulatory Visit: Payer: Medicare Other | Admitting: Internal Medicine

## 2019-01-22 DIAGNOSIS — Z515 Encounter for palliative care: Secondary | ICD-10-CM

## 2019-01-22 NOTE — Progress Notes (Signed)
July 2nd, 2020 [facility] Palliative Care Consult Note Telephone: [phone]  Fax: [phone]   PATIENT NAME: Stephen Grimes DOB: 08-Jul-1951 MRN: [id number] [place] [id number],  Phone: [phone], [phone], [phone] (may be spouse's M)   Email is: [email or web address],  alternative:  [email or web address]    PRIMARY CARE PROVIDER:   Seward Carol, MD   REFERRING PROVIDER:  Seward Carol, MD [address] [place],  [address]    Blane York Brower FNP (Hematology and Oncology [facility] [phone])   RESPONSIBLE PARTY:   Contact is Niece [name] at [phone]; e-mail: [email or web address].  (spouse) Shirlene (some cognitive deficits)   RECOMMENDATIONS and PLAN:   1.Past functional decline d/t prior hospitalization and comorbid illnesses: Patient has been doing extremely well! States over the last few weeks feels progressive increase in strength. He ambulates about well, without use of assistive devices. 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 He denies dizziness, dyspnea or orthopnea. Good appetite; past nausea associated with taking his lactulose, for which he took his prn Zofran. However, he has conquered this by spacing his lactulose doses a little further from food consumption. I forgot to ask about his weight. Previous weight was 120 lbs. At a height of 5'7" his BMI was 18.8. Only complaint was that of some R sided muscular pain last week; now resolved.   2. Care giver supports: Patient resides with wife [name] (has mild cognitive deficits). Many involved family members..    3. Goals of Care: Patient has a f/u echo cardiogram in one week (low EF at time of hospitalization) and staging MRI next week as well (metastatic renal cancer). Pending those results, his team will  assess his ability to withstand the rigors of further chemo for his renal cancer.   4. Advanced Care Directives: Full code (we revisited this today).   5. Follow up: Patient requests that I reach out to schedule a f/u towards the end of September. He has my contact information should he wish to be seen sooner.    I spent 30 minutes providing this consultation from 4pm to 4:30pm. More than 50% of the time in this consultation was spent coordinating communication.    HISTORY OF PRESENT ILLNESS:  Stephen Grimes is a 68 y.o. male with h/o  metastatic (retroperitoneal lymph nodes; small decrease in size by CT 09/2018, c/w CT 08/2017) renal carcinoma (R nephrectomy), OSA, HTN, COPD ,hypothyroidism, alcoholic cirrhosis / encephalopathy (last ETOH use 5 years ago). Patient is s/p recent hospitalization 4/26-11/23/2018, where he was treated for hypoxic respiratory failure (intubation; hypoxia completely resolved by time of discharge), encephalopathy/unresponsive episode (resolved). He was found to have Takotsubo cardiomyopathy (EF 15-20%; medical management asa/plavix/metoprolol), and acute on chronic kidney disease IV r/t hypovolemia; Cr peak of 3.46; down to 1.29 at time of discharge). This is a Palliative Care f/u from May 5th.   CODE STATUS: Full Code   PPS: 90% (improved from 60% 2 months earlier)   HOSPICE ELIGIBILITY/DIAGNOSIS: TBD  PAST MEDICAL HISTORY:  Past Medical History:  Diagnosis Date  . Alcoholic cirrhosis ([place])   . Cancer [facility])    kidney cancer  . Cirrhosis ([place])   . COPD (chronic obstructive pulmonary disease) ([place])   . Coronary artery disease   . HTN (hypertension)   . Hypertension   . Metastatic renal cell carcinoma ([place])   .  Renal cancer ([place])   . Renal disorder   . Renal impairment   . Sleep apnea   . Thrombocytopathia ([place])   . Thrombocytopenia ([place]) 12/11/2011  . Thyroid disease     SOCIAL HX:  Social History   Tobacco Use  . Smoking status: Never Smoker  .  Smokeless tobacco: Never Used  Substance Use Topics  . Alcohol use: No    ALLERGIES: No Known Allergies   PERTINENT MEDICATIONS:  Outpatient Encounter Medications as of 01/22/2019  Medication Sig  . aspirin 81 MG chewable tablet Chew 1 tablet (81 mg total) by mouth daily with breakfast.  . clopidogrel (PLAVIX) 75 MG tablet Take 1 tablet (75 mg total) by mouth daily.  . feeding supplement, ENSURE ENLIVE, (ENSURE ENLIVE) LIQD Take 237 mLs by mouth 3 (three) times daily between meals. (Patient taking differently: Take 237 mLs by mouth 2 (two) times daily between meals. )  . lactulose (CHRONULAC) 10 GM/15ML solution Place 30 mLs (20 g total) into feeding tube 2 (two) times daily. (Patient taking differently: Take 20 g by mouth 2 (two) times daily. 15 ml bid)  . levothyroxine (SYNTHROID) 100 MCG tablet Take 1 tablet (100 mcg total) by mouth daily before breakfast.  . ondansetron (ZOFRAN-ODT) 8 MG disintegrating tablet Take 8 mg by mouth 3 (three) times daily as needed for nausea/vomiting.  . rifaximin (XIFAXAN) 550 MG TABS tablet Take 1 tablet (550 mg total) by mouth 2 (two) times daily.  Marland Kitchen thiamine 100 MG tablet Take 1 tablet (100 mg total) by mouth daily.  Marland Kitchen amLODipine (NORVASC) 2.5 MG tablet Take 1 tablet (2.5 mg total) by mouth daily.  . furosemide (LASIX) 40 MG tablet Take 1 tablet by mouth daily.  Marland Kitchen spironolactone (ALDACTONE) 25 MG tablet Take 1 tablet by mouth daily.  . urea (CARMOL) 40 % CREA Apply 1 application topically 2 (two) times daily. Apply to sore areas on hands and feet  . [DISCONTINUED] omeprazole (PRILOSEC) 20 MG capsule TAKE 1 CAPSULE BY MOUTH ONCE DAILY   No facility-administered encounter medications on file as of 01/22/2019.     PHYSICAL EXAM:   Limited 2/2 tele-health visit Patient is A &O x4; pleasantly conversant. Slender. He is walking about outside, taking time out of his working schedule.  Easily conversant without dyspnea.. No extremity / joint deformities or  edema noted. No skin breakdown or rashes.  Julianne Handler, NP

## 2019-01-26 ENCOUNTER — Telehealth: Payer: Self-pay | Admitting: Cardiology

## 2019-01-26 NOTE — Telephone Encounter (Signed)
  Patient missed message giving instructions for echo. He would like a call

## 2019-01-28 NOTE — Telephone Encounter (Signed)
Called and spoke with patient. He came into the lobby and spoke with staff downstairs and received the information he needed.  He reports answered NO to all screening questions asked today.     COVID-19 Pre-Screening Questions:  . In the past 7 to 10 days have you had a cough,  shortness of breath, headache, congestion, fever (100 or greater) body aches, chills, sore throat, or sudden loss of taste or sense of smell? . Have you been around anyone with known Covid 19. . Have you been around anyone who is awaiting Covid 19 test results in the past 7 to 10 days? . Have you been around anyone who has been exposed to Covid 19, or has mentioned symptoms of Covid 19 within the past 7 to 10 days?  If you have any concerns/questions about symptoms patients report during screening (either on the phone or at threshold). Contact the provider seeing the patient or DOD for further guidance.  If neither are available contact a member of the leadership team.

## 2019-01-29 ENCOUNTER — Ambulatory Visit (HOSPITAL_COMMUNITY): Payer: Medicare Other | Attending: Cardiovascular Disease

## 2019-01-29 ENCOUNTER — Other Ambulatory Visit: Payer: Self-pay

## 2019-01-29 DIAGNOSIS — I5021 Acute systolic (congestive) heart failure: Secondary | ICD-10-CM

## 2019-01-30 ENCOUNTER — Telehealth: Payer: Self-pay | Admitting: Cardiology

## 2019-01-30 NOTE — Telephone Encounter (Signed)
Pt has been notified of echo results by phone with verbal understanding. Pt thanked me for the good news. The patient has been notified of the result and verbalized understanding.  All questions (if any) were answered. Stephen Grimes, [place] 01/30/2019 3:01 PM

## 2019-01-30 NOTE — Telephone Encounter (Signed)
-----   Message from Nuala Alpha, LPN sent at 8/64/8472 11:50 AM EDT -----  ----- Message ----- From: Jerline Pain, MD Sent: 01/30/2019  11:34 AM EDT To: Shellia Cleverly, RN, [facility] Triage  Excellent normalized EF once again 65%.  Reassuring.  Candee Furbish, MD

## 2019-01-30 NOTE — Telephone Encounter (Signed)
New Message             Patient is returning Arbie Cookey call concerning lab results, pls call again

## 2019-01-30 NOTE — Telephone Encounter (Signed)
lmtcb x2 

## 2019-02-02 ENCOUNTER — Institutional Professional Consult (permissible substitution): Admit: 2019-02-02 | Discharge: 2019-02-03 | Payer: MEDICARE | Attending: Family | Primary: Family

## 2019-02-02 DIAGNOSIS — C649 Malignant neoplasm of unspecified kidney, except renal pelvis: Secondary | ICD-10-CM

## 2019-02-02 DIAGNOSIS — K7460 Unspecified cirrhosis of liver: Principal | ICD-10-CM

## 2019-02-02 DIAGNOSIS — C779 Secondary and unspecified malignant neoplasm of lymph node, unspecified: Secondary | ICD-10-CM

## 2019-02-02 MED ORDER — LEVOTHYROXINE 100 MCG TABLET
ORAL_TABLET | Freq: Every day | ORAL | 3 refills | 30 days
Start: 2019-02-02 — End: ?

## 2019-02-02 MED ORDER — XIFAXAN 550 MG TABLET
Freq: Two times a day (BID) | ORAL | 0 refills | 0 days
Start: 2019-02-02 — End: 2019-02-11

## 2019-02-02 MED ORDER — SPIRONOLACTONE 25 MG TABLET
ORAL_TABLET | Freq: Every day | ORAL | 0 refills | 90 days
Start: 2019-02-02 — End: ?

## 2019-02-02 MED ORDER — AMLODIPINE 5 MG TABLET
ORAL_TABLET | Freq: Every day | ORAL | 11 refills | 60 days
Start: 2019-02-02 — End: 2019-02-11

## 2019-02-02 MED ORDER — EVEROLIMUS (ANTINEOPLASTIC) 10 MG TABLET
ORAL_TABLET | Freq: Every day | ORAL | 6 refills | 30 days | Status: CP
Start: 2019-02-02 — End: ?
  Filled 2019-02-03: qty 56, 56d supply, fill #0

## 2019-02-03 MED FILL — AFINITOR 10 MG TABLET: 56 days supply | Qty: 56 | Fill #0 | Status: AC

## 2019-02-11 ENCOUNTER — Other Ambulatory Visit: Admit: 2019-02-11 | Discharge: 2019-02-12 | Payer: MEDICARE

## 2019-02-11 ENCOUNTER — Ambulatory Visit: Admit: 2019-02-11 | Discharge: 2019-02-12 | Payer: MEDICARE | Attending: Family | Primary: Family

## 2019-02-11 DIAGNOSIS — C649 Malignant neoplasm of unspecified kidney, except renal pelvis: Secondary | ICD-10-CM

## 2019-02-11 DIAGNOSIS — C779 Secondary and unspecified malignant neoplasm of lymph node, unspecified: Principal | ICD-10-CM

## 2019-02-11 DIAGNOSIS — I5021 Acute systolic (congestive) heart failure: Secondary | ICD-10-CM

## 2019-02-11 MED ORDER — OXYCODONE 5 MG TABLET
ORAL_TABLET | Freq: Four times a day (QID) | ORAL | 0 refills | 15 days | Status: CP | PRN
Start: 2019-02-11 — End: ?

## 2019-02-25 ENCOUNTER — Institutional Professional Consult (permissible substitution): Admit: 2019-02-25 | Discharge: 2019-02-26 | Payer: MEDICARE | Attending: Family | Primary: Family

## 2019-03-11 ENCOUNTER — Institutional Professional Consult (permissible substitution)
Admit: 2019-03-11 | Discharge: 2019-03-12 | Payer: MEDICARE | Attending: Pharmacist Clinician (PhC)/ Clinical Pharmacy Specialist | Primary: Pharmacist Clinician (PhC)/ Clinical Pharmacy Specialist

## 2019-03-11 ENCOUNTER — Other Ambulatory Visit: Payer: Self-pay | Admitting: Gastroenterology

## 2019-03-20 ENCOUNTER — Telehealth: Payer: Self-pay | Admitting: *Deleted

## 2019-03-20 NOTE — Telephone Encounter (Signed)
   [facility] Pre-operative Risk Assessment    Request for surgical clearance:  1. What type of surgery is being performed?  ENDOSCOPY   2. When is this surgery scheduled?  04-28-2019   3. What type of clearance is required (medical clearance vs. Pharmacy clearance to hold med vs. Both)?  PHARMACY  4. Are there any medications that need to be held prior to surgery and how long? PLAVIX   5. Practice name and name of physician performing surgery?  DR. Michail Sermon / [facility]   6. What is your office phone number  [phone]   7.   What is your office fax number [phone]  8.   Anesthesia type (None, local, MAC, general) ?  N/A   Jeanann Lewandowsky 03/20/2019, 3:56 PM  _________________________________________________________________   (provider comments below)

## 2019-03-22 NOTE — Telephone Encounter (Signed)
Dr. Marlou Porch to review clearance on followup 9/2. Note h/o NSTEMI (type 2?) and takotsubo cardiomyopathy. Recent echo showed normalization of EF. Scheduled to followup with Dr. Marlou Porch in 3 days.

## 2019-03-23 NOTE — Telephone Encounter (Signed)
He may proceed with endoscopy.  He may hold his clopidogrel for 5 days prior.  Thank you. Candee Furbish, MD

## 2019-03-24 ENCOUNTER — Telehealth: Payer: Self-pay | Admitting: Cardiology

## 2019-03-24 MED ORDER — CLOPIDOGREL BISULFATE 75 MG PO TABS: 75.0000 mg | ORAL_TABLET | Freq: Every day | ORAL | 6 refills | Status: DC

## 2019-03-24 NOTE — Telephone Encounter (Signed)
Reviewed with Dr Marlou Porch who gives verbal orders for pt to continue on Plavix until 10/2019.   Pt is aware to continue on Plavix and states he will do so as instructed.

## 2019-03-24 NOTE — Telephone Encounter (Signed)
New Message:     Pt wants to know if he is still supposed to be taking Plavix? If he is, he will need it called in to his pharmacy-[facility]- [phone].

## 2019-03-25 ENCOUNTER — Institutional Professional Consult (permissible substitution): Admit: 2019-03-25 | Discharge: 2019-03-26 | Payer: MEDICARE | Attending: Family | Primary: Family

## 2019-03-25 ENCOUNTER — Ambulatory Visit (INDEPENDENT_AMBULATORY_CARE_PROVIDER_SITE_OTHER): Payer: Medicare Other | Admitting: Cardiology

## 2019-03-25 ENCOUNTER — Encounter: Payer: Self-pay | Admitting: Cardiology

## 2019-03-25 ENCOUNTER — Other Ambulatory Visit: Payer: Self-pay

## 2019-03-25 VITALS — BP 106/60 | HR 65 | Ht 67.0 in | Wt 140.0 lb

## 2019-03-25 DIAGNOSIS — I420 Dilated cardiomyopathy: Secondary | ICD-10-CM | POA: Diagnosis not present

## 2019-03-25 DIAGNOSIS — C649 Malignant neoplasm of unspecified kidney, except renal pelvis: Secondary | ICD-10-CM | POA: Diagnosis not present

## 2019-03-25 DIAGNOSIS — I5181 Takotsubo syndrome: Secondary | ICD-10-CM | POA: Diagnosis not present

## 2019-03-25 DIAGNOSIS — C779 Secondary and unspecified malignant neoplasm of lymph node, unspecified: Secondary | ICD-10-CM

## 2019-03-25 NOTE — Telephone Encounter (Signed)
   Primary Cardiologist: Candee Furbish, MD  Chart reviewed as part of pre-operative protocol coverage. Given past medical history and time since last visit, based on ACC/AHA guidelines, SHAWAN CORELLA would be at acceptable risk for the planned procedure without further cardiovascular testing.  Patient has a history of Takotsubo cardiomyopathy with normalization of EF.  The patient is no longer taking Plavix, okay to discontinue altogether.  He continues on aspirin 81 mg, which can be held if needed for procedure.  I will route this recommendation to the requesting party via Epic fax function and remove from pre-op pool.  Please call with questions.  Daune Perch, NP 03/25/2019, 2:51 PM

## 2019-03-25 NOTE — Patient Instructions (Signed)
Medication Instructions:   STOP TAKING PLAVIX NOW PER DR. Marlou Porch  If you need a refill on your cardiac medications before your next appointment, please call your pharmacy.      Follow-Up:  WITH DR. Marlou Porch AS NEEDED

## 2019-03-25 NOTE — Progress Notes (Signed)
Cardiology Office Note:    Date:  03/25/2019   ID:  Stephen Grimes, DOB 08-04-1950, MRN [id number]  PCP:  Seward Carol, MD  Cardiologist:  Candee Furbish, MD  Electrophysiologist:  None   Referring MD: Seward Carol, MD     History of Present Illness:    Stephen Grimes is a 68 y.o. male with metastatic renal cell carcinoma, alcoholic cirrhosis, prior stress-induced cardiomyopathy with EF 20% in the hospital stay with troponin of 16 which look like diffuse ST segment changes of the anterior wall however, on repeat echocardiogram, his ejection fraction normalized.  Has been taken care of by [facility] for renal cancer.  Feels rejuvenated. Home care has helped, doing very well.  Denies any chest pain fevers chills nausea vomiting shortness of breath.  Good caregiver support.  Past Medical History:  Diagnosis Date  . Alcoholic cirrhosis ([place])   . Cancer [facility])    kidney cancer  . Cirrhosis ([place])   . COPD (chronic obstructive pulmonary disease) ([place])   . Coronary artery disease   . HTN (hypertension)   . Hypertension   . Metastatic renal cell carcinoma ([place])   . Renal cancer ([place])   . Renal disorder   . Renal impairment   . Sleep apnea   . Thrombocytopathia ([place])   . Thrombocytopenia ([place]) 12/11/2011  . Thyroid disease     Past Surgical History:  Procedure Laterality Date  . kidney removed Right   . TESTICLE REMOVAL      Current Medications: Current Meds  Medication Sig  . amLODipine (NORVASC) 2.5 MG tablet Take 1 tablet (2.5 mg total) by mouth daily.  Marland Kitchen aspirin 81 MG chewable tablet Chew 1 tablet (81 mg total) by mouth daily with breakfast.  . furosemide (LASIX) 40 MG tablet Take 1 tablet by mouth daily.  Marland Kitchen levothyroxine (SYNTHROID) 100 MCG tablet Take 1 tablet (100 mcg total) by mouth daily before breakfast.  . omeprazole (PRILOSEC) 20 MG capsule Take 20 mg by mouth daily.  . urea (CARMOL) 40 % CREA Apply 1 application topically 2 (two) times daily. Apply to sore areas  on hands and feet  . [DISCONTINUED] clopidogrel (PLAVIX) 75 MG tablet Take 1 tablet (75 mg total) by mouth daily.     Allergies:   Patient has no known allergies.   Social History   Socioeconomic History  . Marital status: Single    Spouse name: Not on file  . Number of children: Not on file  . Years of education: Not on file  . Highest education level: Not on file  Occupational History  . Not on file  Social Needs  . Financial resource strain: Not on file  . Food insecurity    Worry: Not on file    Inability: Not on file  . Transportation needs    Medical: Not on file    Non-medical: Not on file  Tobacco Use  . Smoking status: Never Smoker  . Smokeless tobacco: Never Used  Substance and Sexual Activity  . Alcohol use: No  . Drug use: Not on file  . Sexual activity: Not on file  Lifestyle  . Physical activity    Days per week: Not on file    Minutes per session: Not on file  . Stress: Not on file  Relationships  . Social Herbalist on phone: Not on file    Gets together: Not on file    Attends religious service: Not on file  Active member of club or organization: Not on file    Attends meetings of clubs or organizations: Not on file    Relationship status: Not on file  Other Topics Concern  . Not on file  Social History Narrative   ** Merged History Encounter **         Family History: The patient's as no early family history of CAD  ROS:   Please see the history of present illness.     All other systems reviewed and are negative.  EKGs/Labs/Other Studies Reviewed:    The following studies were reviewed today:  ECHO: 01/29/19  1. The left ventricle has normal systolic function with an ejection fraction of 60-65%. The cavity size was normal. Left ventricular diastolic Doppler parameters are consistent with pseudonormalization.  2. The right ventricle has normal systolic function. The cavity was normal. There is no increase in right ventricular  wall thickness.  3. No evidence of mitral valve stenosis.  4. No stenosis of the aortic valve.  5. The aortic root and ascending aorta are normal in size and structure.  6. The average left ventricular global longitudinal strain is -20.5 %.  7. The interatrial septum was not assessed.  EKG:  EKG is  ordered today.  The ekg ordered today demonstrates normal sinus rhythm 65 with no other abnormalities.  Recent Labs: 11/20/2018: ALT 34 11/22/2018: BUN 22; Creatinine, Ser 1.29; Magnesium 1.9; Potassium 5.3; Sodium 136; TSH 106.553 11/23/2018: Hemoglobin 8.9; Platelets 165  Recent Lipid Panel No results found for: CHOL, TRIG, HDL, CHOLHDL, VLDL, LDLCALC, LDLDIRECT  Physical Exam:    VS:  BP 106/60   Pulse 65   Ht 5\' 7"  (1.702 m)   Wt 140 lb (63.5 kg)   SpO2 97%   BMI 21.93 kg/m     Wt Readings from Last 3 Encounters:  03/25/19 140 lb (63.5 kg)  01/19/19 128 lb (58.1 kg)  11/23/18 137 lb 9.1 oz (62.4 kg)     GEN: Thin in no acute distress HEENT: Normal NECK: No JVD; No carotid bruits LYMPHATICS: No lymphadenopathy CARDIAC: RRR, no murmurs, rubs, gallops RESPIRATORY:  Clear to auscultation without rales, wheezing or rhonchi  ABDOMEN: Soft, non-tender, non-distended MUSCULOSKELETAL:  No edema; No deformity  SKIN: Warm and dry NEUROLOGIC:  Alert and oriented x 3 PSYCHIATRIC:  Normal affect   ASSESSMENT:    1. Takotsubo cardiomyopathy   2. Dilated cardiomyopathy ([place])   3. Metastatic renal cell carcinoma to lymph node (HCC)    PLAN:    In order of problems listed above:  Takotsubo dilated cardiomyopathy -EF is now normalized.  Was 20%, currently 60% -Excellent.  Stress-induced cardiomyopathy. - Recommendation after further evaluation and follow-up is to not start Plavix back, he has been off of this for quite some time.  He has been doing well without it.  Continue with aspirin.  If he does have any bleeding issues he may stop the aspirin. -He may proceed with endoscopy.    Metastatic renal cell carcinoma - Previously noted periaortic lymph nodes with worsening of disease, treated at [facility].  Continuing to follow via telehealth.  Prior hepatic encephalopathy/cirrhosis - Per GI.  Miraculous recovery.  Excellent.   Medication Adjustments/Labs and Tests Ordered: Current medicines are reviewed at length with the patient today.  Concerns regarding medicines are outlined above.  Orders Placed This Encounter  Procedures  . EKG 12-Lead   No orders of the defined types were placed in this encounter.  Patient Instructions  Medication Instructions:   STOP TAKING PLAVIX NOW PER DR. Marlou Porch  If you need a refill on your cardiac medications before your next appointment, please call your pharmacy.      Follow-Up:  WITH DR. Marlou Porch AS NEEDED       Signed, Candee Furbish, MD  03/25/2019 12:14 PM    [facility]

## 2019-04-09 ENCOUNTER — Institutional Professional Consult (permissible substitution)
Admit: 2019-04-09 | Discharge: 2019-04-10 | Payer: MEDICARE | Attending: Pharmacist Clinician (PhC)/ Clinical Pharmacy Specialist | Primary: Pharmacist Clinician (PhC)/ Clinical Pharmacy Specialist

## 2019-04-09 DIAGNOSIS — I5021 Acute systolic (congestive) heart failure: Secondary | ICD-10-CM

## 2019-04-21 ENCOUNTER — Other Ambulatory Visit: Payer: Medicare Other | Admitting: Internal Medicine

## 2019-04-21 ENCOUNTER — Encounter: Payer: Self-pay | Admitting: Internal Medicine

## 2019-04-21 ENCOUNTER — Telehealth: Payer: Self-pay | Admitting: *Deleted

## 2019-04-21 ENCOUNTER — Other Ambulatory Visit: Payer: Self-pay

## 2019-04-21 DIAGNOSIS — Z515 Encounter for palliative care: Secondary | ICD-10-CM

## 2019-04-21 NOTE — Telephone Encounter (Signed)
    [facility] Pre-operative Risk Assessment    Request for surgical clearance:  1. What type of surgery is being performed? ENDOSCOPY   2. When is this surgery scheduled? 04/28/19   3. What type of clearance is required (medical clearance vs. Pharmacy clearance to hold med vs. Both)? MEDICAL  4. Are there any medications that need to be held prior to surgery and how long? PLAVIX    5. Practice name and name of physician performing surgery? [facility]; DR. Michail Sermon   6. What is your office phone number [phone]    7.   What is your office fax number [phone]  8.   Anesthesia type (None, local, MAC, general) ? NOT LISTED; PROPOFOL?    Julaine Hua 04/21/2019, 4:44 PM  _________________________________________________________________   (provider comments below)

## 2019-04-21 NOTE — Progress Notes (Signed)
Sept 29th, 2020 [facility] Palliative Care Consult Note Telephone: [phone]  Fax: [phone]   Due to the current COVID-19 infection/crises, the family prefer, and have given their verbal consent for, a provider visit via telemedicine. HIPPA policies of confidentially were discussed. Video-audio (telehealth) contact was unable to be done due technical barriers from the patient's side.  PATIENT NAME: Stephen Grimes DOB: August 16, 1950 MRN: [id number] [address] [place] [address],  Phone: [phone], [phone], [phone] (may be spouse's M)   Email is: [email or web address],  alternative:  [email or web address]   PRIMARY CARE PROVIDER:   Seward Carol, MD   REFERRING PROVIDER:  Seward Carol, MD [address] [place], [place] [address]    Stephen York Brower FNP (Hematology and Oncology [facility] [id number])   RESPONSIBLE PARTY:   Contact is Niece Stephen Grimes at [phone]; e-mail: [email or web address].  (spouse) Stephen Grimes (some cognitive deficits)   RECOMMENDATIONS and PLAN:    1.Advance Care Planning:   A. Directives: Full code.  B. Goals of Care: Continuation of treatment of metastatic renal carcinoma. Continue working full time supervisory role for [employer].  2. Functional status: Patient has had normalization of his cardiac function; July echocardiogram EF 60%, from previous 20%. (Takotsubo dilated cardiomyopathy; stress induced). He's been feeling very well "up an rolling!"; independent in all ADLs and IDLs. 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 He restarted chemo regimen for kidney cancer and is tolerating without appreciable side effect. Anticipates restaging scan nxt few months. Good appetite of a regular diet supplemented by two Boost daily. His current weight is 140 lbs (gain of 20 lbs over 3 months). At a height of 5'7" his BMI is 22 kg/m2. He is anticipating an EGD early  Oct to evaluate some abdominal discomfort/known hernia. Continues Lactulose BID for liver cirrhosis.   3. Care giver supports: Patient resides with wife Stephen Grimes (has mild cognitive deficits). Many involved family members.      4. Follow up: Call to f/u in 2-3 months (early December 2020). He has my contact information should he wish to be seen sooner.    I spent 30 minutes providing this consultation from 11 to 11:30am. Grimes than 50% of the time in this consultation was spent coordinating communication.    HISTORY OF PRESENT ILLNESS:  Stephen Grimes is a 68 y.o. male with h/o  metastatic (retroperitoneal lymph nodes; small decrease in size by CT 09/2018, c/w CT 08/2017) renal carcinoma (R nephrectomy), OSA, HTN, COPD ,hypothyroidism, alcoholic cirrhosis / encephalopathy (last ETOH use 5 years ago). Hospitalization 4/26-11/23/2018, where he was treated for hypoxic respiratory failure (intubation; hypoxia completely resolved by time of discharge), encephalopathy/unresponsive episode (resolved). He was found to have Takotsubo cardiomyopathy (EF 15-20%) with acute on chronic kidney disease r/t hypovolemia. F/U Echo July 2020 normalization of EF to 60%. This is a Palliative Care [place] Visit f/u from 01/22/2019.   CODE STATUS: Full Code   PPS: 90%    HOSPICE ELIGIBILITY/DIAGNOSIS: TBD  PAST MEDICAL HISTORY:  Past Medical History:  Diagnosis Date  . Alcoholic cirrhosis ([place])   . Cancer [facility])    kidney cancer  . Cirrhosis ([place])   . COPD (chronic obstructive pulmonary disease) ([place])   . Coronary artery disease   . HTN (hypertension)   . Hypertension   . Metastatic renal cell carcinoma ([place])   . Renal cancer ([place])   . Renal disorder   . Renal impairment   . Sleep apnea   .  Thrombocytopathia ([place])   . Thrombocytopenia ([place]) 12/11/2011  . Thyroid disease     SOCIAL HX:  Social History   Tobacco Use  . Smoking status: Never Smoker  . Smokeless tobacco: Never Used  Substance Use Topics  .  Alcohol use: No    ALLERGIES: No Known Allergies   PERTINENT MEDICATIONS:  Outpatient Encounter Medications as of 04/21/2019  Medication Sig  . amLODipine (NORVASC) 2.5 MG tablet Take 1 tablet (2.5 mg total) by mouth daily.  Marland Kitchen aspirin 81 MG chewable tablet Chew 1 tablet (81 mg total) by mouth daily with breakfast.  . furosemide (LASIX) 40 MG tablet Take 1 tablet by mouth daily.  Marland Kitchen levothyroxine (SYNTHROID) 100 MCG tablet Take 1 tablet (100 mcg total) by mouth daily before breakfast.  . omeprazole (PRILOSEC) 20 MG capsule Take 20 mg by mouth daily.  . urea (CARMOL) 40 % CREA Apply 1 application topically 2 (two) times daily. Apply to sore areas on hands and feet   No facility-administered encounter medications on file as of 04/21/2019.     PHYSICAL EXAM:   Limited 2/2 tele-health audio only visit. Patient is A &O; pleasantly conversant. He is walking about outside, taking time out of his working schedule.  Easily conversant without dyspnea.  Stephen Handler, NP

## 2019-04-22 ENCOUNTER — Other Ambulatory Visit: Payer: Self-pay | Admitting: Gastroenterology

## 2019-04-22 NOTE — Telephone Encounter (Signed)
   Primary Cardiologist: Candee Furbish, MD  Chart reviewed as part of pre-operative protocol coverage. Request received regarding holding plavix for endoscopy. Pt was seen by Dr. Marlou Porch on 03/25/19 and plavix was discontinued at that time.   I will route this recommendation to the requesting party via Epic fax function and remove from pre-op pool.  Please call with questions.  [place], PA 04/22/2019, 9:31 AM

## 2019-04-23 ENCOUNTER — Institutional Professional Consult (permissible substitution)
Admit: 2019-04-23 | Discharge: 2019-04-24 | Payer: MEDICARE | Attending: Pharmacist Clinician (PhC)/ Clinical Pharmacy Specialist | Primary: Pharmacist Clinician (PhC)/ Clinical Pharmacy Specialist

## 2019-04-23 ENCOUNTER — Encounter (HOSPITAL_COMMUNITY): Payer: Self-pay | Admitting: Emergency Medicine

## 2019-04-23 ENCOUNTER — Other Ambulatory Visit: Payer: Self-pay

## 2019-04-23 MED FILL — AFINITOR 10 MG TABLET: 56 days supply | Qty: 56 | Fill #1 | Status: AC

## 2019-04-23 MED FILL — AFINITOR 10 MG TABLET: ORAL | 56 days supply | Qty: 56 | Fill #1

## 2019-04-23 NOTE — Progress Notes (Signed)
Pre-op endo call complete .  Patient states his plavix has been discontinued by his cardiologist yesterday . Has not been told to stop his aspirin.

## 2019-04-24 ENCOUNTER — Other Ambulatory Visit (HOSPITAL_COMMUNITY)
Admission: RE | Admit: 2019-04-24 | Discharge: 2019-04-24 | Disposition: A | Discharge status: Home / Self Care | Payer: Medicare Other | Source: Ambulatory Visit | Attending: Gastroenterology | Admitting: Gastroenterology

## 2019-04-24 DIAGNOSIS — Z20828 Contact with and (suspected) exposure to other viral communicable diseases: Secondary | ICD-10-CM | POA: Diagnosis not present

## 2019-04-24 DIAGNOSIS — Z01812 Encounter for preprocedural laboratory examination: Secondary | ICD-10-CM | POA: Insufficient documentation

## 2019-04-26 LAB — NOVEL CORONAVIRUS, NAA (HOSP ORDER, SEND-OUT TO REF LAB; TAT 18-24 HRS): SARS-CoV-2, NAA: NOT DETECTED

## 2019-04-27 NOTE — Progress Notes (Signed)
Pre call done .Pt confirmed that he had been able to quarantine and has not had any flu like symptoms or fevers . Questions answered

## 2019-04-28 ENCOUNTER — Encounter (HOSPITAL_COMMUNITY)
Admission: RE | Disposition: A | Discharge status: Home / Self Care | Payer: Self-pay | Source: Home / Self Care | Attending: Gastroenterology

## 2019-04-28 ENCOUNTER — Ambulatory Visit (HOSPITAL_COMMUNITY): Payer: Medicare Other | Admitting: Anesthesiology

## 2019-04-28 ENCOUNTER — Encounter (HOSPITAL_COMMUNITY): Payer: Self-pay

## 2019-04-28 ENCOUNTER — Ambulatory Visit (HOSPITAL_COMMUNITY)
Admission: RE | Admit: 2019-04-28 | Discharge: 2019-04-28 | Disposition: A | Discharge status: Home / Self Care | Payer: Medicare Other | Attending: Gastroenterology | Admitting: Gastroenterology

## 2019-04-28 ENCOUNTER — Other Ambulatory Visit: Payer: Self-pay

## 2019-04-28 DIAGNOSIS — I130 Hypertensive heart and chronic kidney disease with heart failure and stage 1 through stage 4 chronic kidney disease, or unspecified chronic kidney disease: Secondary | ICD-10-CM | POA: Diagnosis not present

## 2019-04-28 DIAGNOSIS — Z85528 Personal history of other malignant neoplasm of kidney: Secondary | ICD-10-CM | POA: Insufficient documentation

## 2019-04-28 DIAGNOSIS — N183 Chronic kidney disease, stage 3 unspecified: Secondary | ICD-10-CM | POA: Insufficient documentation

## 2019-04-28 DIAGNOSIS — Z7982 Long term (current) use of aspirin: Secondary | ICD-10-CM | POA: Insufficient documentation

## 2019-04-28 DIAGNOSIS — Z905 Acquired absence of kidney: Secondary | ICD-10-CM | POA: Insufficient documentation

## 2019-04-28 DIAGNOSIS — Z79899 Other long term (current) drug therapy: Secondary | ICD-10-CM | POA: Diagnosis not present

## 2019-04-28 DIAGNOSIS — I5020 Unspecified systolic (congestive) heart failure: Secondary | ICD-10-CM | POA: Insufficient documentation

## 2019-04-28 DIAGNOSIS — R1084 Generalized abdominal pain: Secondary | ICD-10-CM | POA: Diagnosis not present

## 2019-04-28 DIAGNOSIS — I8500 Esophageal varices without bleeding: Secondary | ICD-10-CM | POA: Diagnosis not present

## 2019-04-28 DIAGNOSIS — J449 Chronic obstructive pulmonary disease, unspecified: Secondary | ICD-10-CM | POA: Diagnosis not present

## 2019-04-28 DIAGNOSIS — Z7902 Long term (current) use of antithrombotics/antiplatelets: Secondary | ICD-10-CM | POA: Insufficient documentation

## 2019-04-28 DIAGNOSIS — K449 Diaphragmatic hernia without obstruction or gangrene: Secondary | ICD-10-CM | POA: Insufficient documentation

## 2019-04-28 DIAGNOSIS — E039 Hypothyroidism, unspecified: Secondary | ICD-10-CM | POA: Insufficient documentation

## 2019-04-28 DIAGNOSIS — K7030 Alcoholic cirrhosis of liver without ascites: Secondary | ICD-10-CM | POA: Diagnosis not present

## 2019-04-28 DIAGNOSIS — I2510 Atherosclerotic heart disease of native coronary artery without angina pectoris: Secondary | ICD-10-CM | POA: Diagnosis not present

## 2019-04-28 DIAGNOSIS — Z79890 Hormone replacement therapy (postmenopausal): Secondary | ICD-10-CM | POA: Insufficient documentation

## 2019-04-28 DIAGNOSIS — I252 Old myocardial infarction: Secondary | ICD-10-CM | POA: Insufficient documentation

## 2019-04-28 HISTORY — PX: ESOPHAGOGASTRODUODENOSCOPY (EGD) WITH PROPOFOL: SHX5813

## 2019-04-28 SURGERY — ESOPHAGOGASTRODUODENOSCOPY (EGD) WITH PROPOFOL
Anesthesia: Monitor Anesthesia Care

## 2019-04-28 MED ORDER — LACTATED RINGERS IV SOLN
INTRAVENOUS | Status: DC
  Administered 2019-04-28: 08:00:00 via INTRAVENOUS

## 2019-04-28 MED ORDER — PROPOFOL 500 MG/50ML IV EMUL
INTRAVENOUS | Status: DC | PRN
  Administered 2019-04-28: 30 mg via INTRAVENOUS
  Administered 2019-04-28 (×2): 10 mg via INTRAVENOUS

## 2019-04-28 MED ORDER — PROPOFOL 500 MG/50ML IV EMUL
INTRAVENOUS | Status: DC | PRN
  Administered 2019-04-28: 125 ug/kg/min via INTRAVENOUS

## 2019-04-28 MED ORDER — SODIUM CHLORIDE 0.9 % IV SOLN: INTRAVENOUS | Status: DC

## 2019-04-28 MED ORDER — PROPOFOL 10 MG/ML IV BOLUS
INTRAVENOUS | Status: AC
  Filled 2019-04-28: qty 40

## 2019-04-28 SURGICAL SUPPLY — 15 items

## 2019-04-28 NOTE — H&P (Signed)
Date of Initial H&P: 04/21/19  History reviewed, patient examined, no change in status, stable for surgery.

## 2019-04-28 NOTE — Anesthesia Postprocedure Evaluation (Signed)
Anesthesia Post Note  Patient: Stephen Grimes  Procedure(s) Performed: ESOPHAGOGASTRODUODENOSCOPY (EGD) WITH PROPOFOL (N/A )     Patient location during evaluation: Endoscopy Anesthesia Type: MAC Level of consciousness: awake and alert Pain management: pain level controlled Vital Signs Assessment: post-procedure vital signs reviewed and stable Respiratory status: spontaneous breathing, nonlabored ventilation and respiratory function stable Cardiovascular status: blood pressure returned to baseline and stable Postop Assessment: no apparent nausea or vomiting Anesthetic complications: no    Last Vitals:  Vitals:   04/28/19 0940 04/28/19 0950  BP: 126/61 (!) 145/61  Pulse: (!) 55 (!) 50  Resp: 19 11  Temp:    SpO2: 96% 100%    Last Pain:  Vitals:   04/28/19 0950  TempSrc:   PainSc: 0-No pain                 Lidia Collum

## 2019-04-28 NOTE — Anesthesia Preprocedure Evaluation (Addendum)
Anesthesia Evaluation  Patient identified by MRN, date of birth, ID band Patient awake    Reviewed: Allergy & Precautions, NPO status , Patient's Chart, lab work & pertinent test results  History of Anesthesia Complications Negative for: history of anesthetic complications  Airway Mallampati: II  TM Distance: >3 FB Neck ROM: Full    Dental  (+) Teeth Intact   Pulmonary COPD,    Pulmonary exam normal        Cardiovascular hypertension, + Past MI and +CHF  Normal cardiovascular exam  Recent MI (April 2020) with acute systolic heart failure to EF of 15-20%, required mechanical ventilation, no intervention, managed medically. A followup echo on 01/29/19 showed EF 60-65%, normal valves. He has recovered remarkably since discharge and was cleared by cardiology for endoscopy on 03/23/19.   Neuro/Psych negative neurological ROS  negative psych ROS   GI/Hepatic negative GI ROS, (+) Cirrhosis       ,   Endo/Other  Hypothyroidism   Renal/GU Renal InsufficiencyRenal disease (metastatic renal cell ca)  negative genitourinary   Musculoskeletal negative musculoskeletal ROS (+)   Abdominal   Peds  Hematology  (+) anemia , thrombocytopenia   Anesthesia Other Findings   Reproductive/Obstetrics                            Anesthesia Physical Anesthesia Plan  ASA: IV  Anesthesia Plan: MAC   Post-op Pain Management:    Induction: Intravenous  PONV Risk Score and Plan: 2 and Propofol infusion, TIVA and Treatment may vary due to age or medical condition  Airway Management Planned: Natural Airway, Nasal Cannula and Simple Face Mask  Additional Equipment: None  Intra-op Plan:   Post-operative Plan:   Informed Consent: I have reviewed the patients History and Physical, chart, labs and discussed the procedure including the risks, benefits and alternatives for the proposed anesthesia with the patient or  authorized representative who has indicated his/her understanding and acceptance.       Plan Discussed with:   Anesthesia Plan Comments:        Anesthesia Quick Evaluation

## 2019-04-28 NOTE — Transfer of Care (Signed)
Immediate Anesthesia Transfer of Care Note  Patient: Stephen Grimes  Procedure(s) Performed: ESOPHAGOGASTRODUODENOSCOPY (EGD) WITH PROPOFOL (N/A )  Patient Location: PACU  Anesthesia Type:MAC  Level of Consciousness: drowsy, patient cooperative and responds to stimulation  Airway & Oxygen Therapy: Patient Spontanous Breathing and Patient connected to nasal cannula oxygen  Post-op Assessment: Report given to RN and Post -op Vital signs reviewed and stable  Post vital signs: Reviewed and stable  Last Vitals:  Vitals Value Taken Time  BP 114/65 04/28/19 0925  Temp    Pulse 55 04/28/19 0925  Resp 23 04/28/19 0925  SpO2 100 % 04/28/19 0925  Vitals shown include unvalidated device data.  Last Pain:  Vitals:   04/28/19 0750  TempSrc: Oral  PainSc: 0-No pain         Complications: No apparent anesthesia complications

## 2019-04-28 NOTE — Discharge Instructions (Signed)

## 2019-04-28 NOTE — Interval H&P Note (Signed)
History and Physical Interval Note:  04/28/2019 8:57 AM  Stephen Grimes  has presented today for surgery, with the diagnosis of Cirrhosis-screen for varices.  The various methods of treatment have been discussed with the patient and family. After consideration of risks, benefits and other options for treatment, the patient has consented to  Procedure(s): ESOPHAGOGASTRODUODENOSCOPY (EGD) WITH PROPOFOL (N/A) as a surgical intervention.  The patient's history has been reviewed, patient examined, no change in status, stable for surgery.  I have reviewed the patient's chart and labs.  Questions were answered to the patient's satisfaction.     [name]

## 2019-04-28 NOTE — Anesthesia Procedure Notes (Signed)
Date/Time: 04/28/2019 9:00 AM Performed by: Glory Buff, CRNA Oxygen Delivery Method: Nasal cannula

## 2019-04-28 NOTE — Op Note (Signed)
[facility] Patient Name: Stephen Grimes Procedure Date: 04/28/2019 MRN: [id number] Attending MD: [name] , MD Date of Birth: 07-12-1951 CSN: [id number] Age: 68 Admit Type: Outpatient Procedure:                Upper GI endoscopy Indications:              Generalized abdominal pain, Cirrhosis rule out                            esophageal varices Providers:                [name], MD, Cleda Daub, RN, Marguerita Merles, Technician, Lazaro Arms, Technician Referring MD:             Seward Carol Medicines:                Propofol per Anesthesia, Monitored Anesthesia Care Complications:            No immediate complications. Estimated Blood Loss:     Estimated blood loss: none. Procedure:                Pre-Anesthesia Assessment:                           - Prior to the procedure, a History and Physical                            was performed, and patient medications and                            allergies were reviewed. The patient's tolerance of                            previous anesthesia was also reviewed. The risks                            and benefits of the procedure and the sedation                            options and risks were discussed with the patient.                            All questions were answered, and informed consent                            was obtained. Prior Anticoagulants: The patient has                            taken no previous anticoagulant or antiplatelet                            agents. ASA Grade Assessment: IV - A patient with  severe systemic disease that is a constant threat                            to life. After reviewing the risks and benefits,                            the patient was deemed in satisfactory condition to                            undergo the procedure.                           After obtaining informed consent, the endoscope  was                            passed under direct vision. Throughout the                            procedure, the patient's blood pressure, pulse, and                            oxygen saturations were monitored continuously. The                            GIF-H190 ([id number]) Olympus gastroscope was                            introduced through the mouth, and advanced to the                            second part of duodenum. The upper GI endoscopy was                            accomplished without difficulty. The patient                            tolerated the procedure well. Scope In: Scope Out: Findings:      Grade I varices were found in the distal esophagus.      The Z-line was regular and was found 36 cm from the incisors.      A medium-sized hiatal hernia was present.      The exam of the stomach was otherwise normal.      The examined duodenum was normal. Impression:               - Grade I esophageal varices.                           - Z-line regular, 36 cm from the incisors.                           - Medium-sized hiatal hernia.                           - Normal examined duodenum.                           -  No specimens collected. Moderate Sedation:      Not Applicable - Patient had care per Anesthesia. Recommendation:           - Patient has a contact number available for                            emergencies. The signs and symptoms of potential                            delayed complications were discussed with the                            patient. Return to normal activities tomorrow.                            Written discharge instructions were provided to the                            patient.                           - Low sodium diet.                           - Repeat upper endoscopy in 2 years for                            surveillance. Procedure Code(s):        --- Professional ---                           [phone], Esophagogastroduodenoscopy,  flexible,                            transoral; diagnostic, including collection of                            specimen(s) by brushing or washing, when performed                            (separate procedure) Diagnosis Code(s):        --- Professional ---                           K74.60, Unspecified cirrhosis of liver                           I85.10, Secondary esophageal varices without                            bleeding                           R10.84, Generalized abdominal pain                           K44.9, Diaphragmatic hernia without obstruction or  gangrene CPT copyright 2019 American Medical Association. All rights reserved. The codes documented in this report are preliminary and upon coder review may  be revised to meet current compliance requirements. [name], MD 04/28/2019 9:28:45 AM This report has been signed electronically. Number of Addenda: 0

## 2019-04-29 ENCOUNTER — Encounter (HOSPITAL_COMMUNITY): Payer: Self-pay | Admitting: Gastroenterology

## 2019-05-05 DIAGNOSIS — C649 Malignant neoplasm of unspecified kidney, except renal pelvis: Principal | ICD-10-CM

## 2019-05-05 DIAGNOSIS — C779 Secondary and unspecified malignant neoplasm of lymph node, unspecified: Principal | ICD-10-CM

## 2019-05-06 ENCOUNTER — Ambulatory Visit: Admit: 2019-05-06 | Discharge: 2019-05-06 | Payer: MEDICARE

## 2019-05-06 ENCOUNTER — Ambulatory Visit: Admit: 2019-05-06 | Discharge: 2019-05-06 | Payer: MEDICARE | Attending: Family | Primary: Family

## 2019-05-06 DIAGNOSIS — Z79899 Other long term (current) drug therapy: Principal | ICD-10-CM

## 2019-05-06 DIAGNOSIS — C649 Malignant neoplasm of unspecified kidney, except renal pelvis: Principal | ICD-10-CM

## 2019-05-06 DIAGNOSIS — C779 Secondary and unspecified malignant neoplasm of lymph node, unspecified: Principal | ICD-10-CM

## 2019-06-03 ENCOUNTER — Institutional Professional Consult (permissible substitution)
Admit: 2019-06-03 | Discharge: 2019-06-04 | Payer: MEDICARE | Attending: Pharmacist Clinician (PhC)/ Clinical Pharmacy Specialist | Primary: Pharmacist Clinician (PhC)/ Clinical Pharmacy Specialist

## 2019-06-09 DIAGNOSIS — C779 Secondary and unspecified malignant neoplasm of lymph node, unspecified: Principal | ICD-10-CM

## 2019-06-09 DIAGNOSIS — C649 Malignant neoplasm of unspecified kidney, except renal pelvis: Principal | ICD-10-CM

## 2019-06-10 ENCOUNTER — Telehealth: Admit: 2019-06-10 | Discharge: 2019-06-11 | Payer: MEDICARE | Attending: Family | Primary: Family

## 2019-06-10 DIAGNOSIS — C649 Malignant neoplasm of unspecified kidney, except renal pelvis: Principal | ICD-10-CM

## 2019-07-01 MED FILL — AFINITOR 10 MG TABLET: ORAL | 56 days supply | Qty: 56 | Fill #2

## 2019-07-01 MED FILL — AFINITOR 10 MG TABLET: 56 days supply | Qty: 56 | Fill #2 | Status: AC

## 2019-07-07 ENCOUNTER — Other Ambulatory Visit: Payer: Medicare Other | Admitting: Internal Medicine

## 2019-07-07 ENCOUNTER — Other Ambulatory Visit: Payer: Self-pay

## 2019-07-07 ENCOUNTER — Encounter: Payer: Self-pay | Admitting: Internal Medicine

## 2019-07-07 DIAGNOSIS — Z515 Encounter for palliative care: Secondary | ICD-10-CM

## 2019-07-07 DIAGNOSIS — Z7189 Other specified counseling: Secondary | ICD-10-CM

## 2019-07-07 NOTE — Progress Notes (Signed)
Dec 15th, 2020 [facility]iative [facility] Consult Note Telephone: [phone]  Fax: [phone]   Due to the current COVID-19 infection/crises, the family prefer, and have given their verbal consent for, a provider visit via telemedicine. HIPPA policies of confidentially were discussed. Video-audio (telehealth) contact was unable to be done due technical barriers from the patient's side.   PATIENT NAME: Stephen Grimes DOB: 1951-06-07 MRN: [id number] [address] [place] [address],  Phone: [phone], [phone], [phone] (may be spouse's M)   Email is: [email or web address],  alternative:  [email or web address]   PRIMARY CARE PROVIDER:   Seward Carol, MD   REFERRING PROVIDER:  Seward Carol, MD [address],  [address]    Dr. Atilano Ina FNP (Hematology and Oncology [facility] [phone])   RESPONSIBLE PARTY:   Minette Brine is Niece [name] at [phone]; e-mail: [email or web address].  (spouse) Shirlene (some cognitive deficits)   RECOMMENDATIONS and PLAN:  1. Advance Care Planning:    A. Directives: Full code.    B. Goals of Care: Continuation of treatment of metastatic renal carcinoma. Tomorrow(07/08/2019) scheduled for restaging scan, lab work then provider visit with [place].   2. 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 Tolerating chemo with only mild side effects (brittle nails; has an occasional cough and skin scaling of ankles and heels which has improved). Good appetite of a regular diet. He has gained 5 lbs over the last 3 months to current weight of 145 lbs. At a height of 5'7" his BMI is 22.7 kg/m2. Continues Lactulose for liver cirrhosis.   3. Care giver supports: Patient resides with wife Stephen Grimes (has mild cognitive deficits/memory loss). Many involved family members. Patient  copes with his illness by not dwelling on it, even though he deals with it. He feels lucky isn't bother with significant symptoms, so his illness is not in the forefront of his thoughts. 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    4. Follow up: Call to f/u in 2-3 months (will call around Tues 09/22/2019 @ 11am). He has my contact information should he wish to be seen sooner.    I spent 30 minutes providing this consultation from 10 to 10:30am. More than 50% of the time in this consultation was spent coordinating communication.    HISTORY OF PRESENT ILLNESS:  Stephen Grimes is a 68 y.o. male with h/o  metastatic (retroperitoneal lymph nodes; small decrease in size by CT 09/2018, c/w CT 08/2017) renal carcinoma (R nephrectomy; restarted single agent chemo 04/2019)), OSA, HTN, COPD, hypothyroidism, alcoholic cirrhosis (Lactulose; no further ETOH use); encephalopathy. Hospitalization 4/26-11/23/2018, where he was treated for hypoxic respiratory failure (intubation; hypoxia completely resolved by time of discharge), encephalopathy/unresponsive episode (resolved). He was found to have Takotsubo cardiomyopathy (EF 15-20%) with acute on chronic kidney disease r/t hypovolemia. F/U Echo July 2020 normalization of EF to 60%. H/O asymmetric LE edema (no DVT doppler 2020), ventral hernia, hypothyroidism (Synthroid), GERD, and malnutrition. EGD 04/28/2019 (Dr. Warnell Bureau): Esophageal varices without bleeding; diaphragmatic hernia This is a Palliative Care Visit f/u from 9/2/92020.  CODE STATUS: Full Code   PPS: 90%    HOSPICE ELIGIBILITY/DIAGNOSIS: TBDPAST MEDICAL HISTORY:  Past Medical History:  Diagnosis Date  . Alcoholic cirrhosis ([place])   . Cancer [facility])    kidney cancer  . Cirrhosis ([place])   . COPD (chronic obstructive pulmonary disease) ([place])   .  Coronary artery disease   . HTN (hypertension)   . Hypertension   . Metastatic renal cell carcinoma ([place])   . Renal cancer ([place])   . Renal disorder   . Renal impairment     . Thrombocytopathia ([place])   . Thrombocytopenia ([place]) 12/11/2011  . Thyroid disease     SOCIAL HX:  Social History   Tobacco Use  . Smoking status: Never Smoker  . Smokeless tobacco: Never Used  Substance Use Topics  . Alcohol use: No    ALLERGIES: No Known Allergies   PERTINENT MEDICATIONS:  Outpatient Encounter Medications as of 07/07/2019  Medication Sig  . amLODipine (NORVASC) 2.5 MG tablet Take 1 tablet (2.5 mg total) by mouth daily.  Marland Kitchen aspirin 81 MG chewable tablet Chew 1 tablet (81 mg total) by mouth daily with breakfast.  . furosemide (LASIX) 40 MG tablet Take 40 mg by mouth daily.   Marland Kitchen lactulose (CHRONULAC) 10 GM/15ML solution Take 20 g by mouth 2 (two) times daily.  Marland Kitchen levothyroxine (SYNTHROID) 100 MCG tablet Take 1 tablet (100 mcg total) by mouth daily before breakfast.  . omeprazole (PRILOSEC) 20 MG capsule Take 20 mg by mouth daily.  Marland Kitchen spironolactone (ALDACTONE) 25 MG tablet Take 25 mg by mouth daily.  Marland Kitchen thiamine (VITAMIN B-1) 100 MG tablet Take 100 mg by mouth daily.  . urea (CARMOL) 40 % CREA Apply 1 application topically 2 (two) times daily. Apply to sore areas on hands and feet   No facility-administered encounter medications on file as of 07/07/2019.    PHYSICAL EXAM:   Limited 2/2 tele-health audio only visit. Patient is A &O; pleasantly conversant. Easily conversant without dyspnea.  Julianne Handler, NP

## 2019-07-08 ENCOUNTER — Ambulatory Visit: Admit: 2019-07-08 | Discharge: 2019-07-08 | Payer: MEDICARE | Attending: Family | Primary: Family

## 2019-07-08 ENCOUNTER — Ambulatory Visit: Admit: 2019-07-08 | Discharge: 2019-07-08 | Payer: MEDICARE

## 2019-07-08 ENCOUNTER — Other Ambulatory Visit: Admit: 2019-07-08 | Discharge: 2019-07-08 | Payer: MEDICARE

## 2019-07-08 DIAGNOSIS — C779 Secondary and unspecified malignant neoplasm of lymph node, unspecified: Principal | ICD-10-CM

## 2019-07-08 DIAGNOSIS — C649 Malignant neoplasm of unspecified kidney, except renal pelvis: Secondary | ICD-10-CM

## 2019-07-08 DIAGNOSIS — Z0000 Encounter for general adult medical examination without abnormal findings: Principal | ICD-10-CM

## 2019-07-29 ENCOUNTER — Institutional Professional Consult (permissible substitution)
Admit: 2019-07-29 | Discharge: 2019-07-30 | Payer: MEDICARE | Attending: Pharmacist Clinician (PhC)/ Clinical Pharmacy Specialist | Primary: Pharmacist Clinician (PhC)/ Clinical Pharmacy Specialist

## 2019-08-05 ENCOUNTER — Telehealth: Admit: 2019-08-05 | Discharge: 2019-08-06 | Payer: MEDICARE | Attending: Family | Primary: Family

## 2019-08-20 DIAGNOSIS — C779 Secondary and unspecified malignant neoplasm of lymph node, unspecified: Principal | ICD-10-CM

## 2019-08-20 DIAGNOSIS — C649 Malignant neoplasm of unspecified kidney, except renal pelvis: Principal | ICD-10-CM

## 2019-08-20 MED ORDER — AFINITOR 10 MG TABLET
ORAL_TABLET | Freq: Every day | ORAL | 6 refills | 30 days | Status: CP
Start: 2019-08-20 — End: ?
  Filled 2019-08-26: qty 28, 28d supply, fill #0

## 2019-08-21 DIAGNOSIS — C649 Malignant neoplasm of unspecified kidney, except renal pelvis: Principal | ICD-10-CM

## 2019-08-21 DIAGNOSIS — C779 Secondary and unspecified malignant neoplasm of lymph node, unspecified: Principal | ICD-10-CM

## 2019-08-21 MED ORDER — AFINITOR 10 MG TABLET
ORAL_TABLET | Freq: Every day | ORAL | 6 refills | 56 days | Status: CP
Start: 2019-08-21 — End: ?

## 2019-08-26 MED FILL — AFINITOR 10 MG TABLET: 28 days supply | Qty: 28 | Fill #0 | Status: AC

## 2019-09-02 ENCOUNTER — Ambulatory Visit: Admit: 2019-09-02 | Discharge: 2019-09-03 | Payer: MEDICARE | Attending: Family | Primary: Family

## 2019-09-02 ENCOUNTER — Other Ambulatory Visit: Admit: 2019-09-02 | Discharge: 2019-09-03 | Payer: MEDICARE

## 2019-09-02 DIAGNOSIS — C649 Malignant neoplasm of unspecified kidney, except renal pelvis: Principal | ICD-10-CM

## 2019-09-02 DIAGNOSIS — C779 Secondary and unspecified malignant neoplasm of lymph node, unspecified: Principal | ICD-10-CM

## 2019-09-02 DIAGNOSIS — E039 Hypothyroidism, unspecified: Principal | ICD-10-CM

## 2019-09-02 DIAGNOSIS — I5021 Acute systolic (congestive) heart failure: Principal | ICD-10-CM

## 2019-09-22 ENCOUNTER — Other Ambulatory Visit: Payer: Self-pay

## 2019-09-22 ENCOUNTER — Other Ambulatory Visit: Payer: Medicare Other | Admitting: Internal Medicine

## 2019-09-22 ENCOUNTER — Encounter: Payer: Self-pay | Admitting: Internal Medicine

## 2019-09-22 DIAGNOSIS — Z7189 Other specified counseling: Secondary | ICD-10-CM

## 2019-09-22 DIAGNOSIS — Z515 Encounter for palliative care: Secondary | ICD-10-CM

## 2019-09-22 NOTE — Progress Notes (Signed)
March 2nd, 2021 [facility] Palliative Care Consult Note Telephone: [phone]  Fax: [phone]   Due to the current COVID-19 infection/crises, the family prefer, and have given their verbal consent for, a provider visit via telemedicine. HIPPA policies of confidentially were discussed. Video-audio (telehealth) contact was unable to be done due technical barriers from the patients side.  PATIENT NAME: Stephen Grimes DOB: [birth year] MRN: [id number] [address] [place] [address],  Phone: [phone], [phone], [phone] (may be spouse's M)   Email is: [email or web address],  alternative:  [email or web address]   PRIMARY CARE PROVIDER:   Seward Carol, MD   REFERRING PROVIDER:  Seward Carol, MD [address], [place] [address]    Dr. Atilano Ina FNP (Hematology and Oncology [facility] [phone])   RESPONSIBLE PARTY:   Contact is Niece Stephen Grimes at [phone]; e-mail: [email or web address].  (spouse) Stephen Grimes (some cognitive deficits)  RECOMMENDATIONS and PLAN:  1. Advance Care Planning:               A. Directives: Full code.               B. Goals of Care: Continuation of treatment of metastatic renal carcinoma. Tomorrow(07/08/2019) scheduled for restaging scan, lab work then provider visit with Stephen Grimes.   2. Functional status: Patient feels he is doing very well, without significant s/e of chemo. He is in his usual good spirits. Has tolerated the same chemo pill well for several years. He has maintained his weight at 148-150lb. At a height of 57 his BMI is 23 kg/m2. 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 Patient denies symptoms of dyspnea, LE edema, or chest pain. He is planning to f/u with his oncology team at [facility] nxt week for lab work, provider visit, and f/u chest/pelvic scans for  reassessment. Patient is taking his lactulose qd over these last few weeks d/t some constipation issues. No symptoms suggestive of encephalopathy from his liver cirrhosis.    3. Care giver supports: Patient resides with wife Stephen Grimes who has mild cognitive decline but she is independent in her ADLs. She doesnt wander and is safe to prepare meals. Many involved family members. Patient copes with his illness by not dwelling on it, even though he deals with it. He feels lucky isnt bother with significant symptoms, so his illness is not in the forefront of his thoughts. 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    4. Follow up: Call to f/u in 2-3 months (Tues 11/24/2019 @ 10am). Not scheduled; just call and see if you can catch him at work. Usually its okay to talk He has my contact information should he wish to be seen sooner.    I spent 30 minutes providing this consultation from 10 to 10:30am. More than 50% of the time in this consultation was spent coordinating communication.    HISTORY OF PRESENT ILLNESS:  Stephen Grimes is a 69 y.o. male with h/o  metastatic (retroperitoneal lymph nodes; small decrease in size by CT 09/2018, c/w CT 08/2017) renal carcinoma (R nephrectomy; restarted single agent chemo 04/2019)), OSA, HTN, COPD, hypothyroidism, alcoholic cirrhosis (Lactulose; no further ETOH use); encephalopathy. Hospitalization 4/26-11/23/2018, where he was treated for hypoxic respiratory failure (intubation; hypoxia completely resolved by time of discharge), encephalopathy/unresponsive episode (resolved). He was found to have Takotsubo cardiomyopathy (EF 15-20%) with acute on chronic kidney disease  r/t hypovolemia. F/U Echo July 2020 normalization of EF to 60%. H/O asymmetric LE edema (no DVT doppler 2020), ventral hernia, hypothyroidism (Synthroid), GERD, and malnutrition. EGD 04/28/2019 (Dr. Warnell Bureau): Esophageal varices without bleeding; diaphragmatic hernia This is a Palliative Care Visit f/u from 07/07/2019.    CODE STATUS: Full Code   PPS: 90%    HOSPICE ELIGIBILITY/DIAGNOSIS: TBD  PAST MEDICAL HISTORY:  Past Medical History:  Diagnosis Date   Alcoholic cirrhosis ([place])    Cancer ([place])    kidney cancer   Cirrhosis ([place])    COPD (chronic obstructive pulmonary disease) ([facility])    Coronary artery disease    HTN (hypertension)    Hypertension    Metastatic renal cell carcinoma ([facility])    Renal cancer ([place])    Renal disorder    Renal impairment    Thrombocytopathia ([place])    Thrombocytopenia ([place]) 12/11/2011   Thyroid disease     SOCIAL HX:  Social History   Tobacco Use   Smoking status: Never Smoker   Smokeless tobacco: Never Used  Substance Use Topics   Alcohol use: No    ALLERGIES: No Known Allergies   PERTINENT MEDICATIONS:  Outpatient Encounter Medications as of 09/22/2019  Medication Sig   aspirin 81 MG chewable tablet Chew 1 tablet (81 mg total) by mouth daily with breakfast.   furosemide (LASIX) 40 MG tablet Take 40 mg by mouth daily.    lactulose (CHRONULAC) 10 GM/15ML solution Take 20 g by mouth 2 (two) times daily.   levothyroxine (SYNTHROID) 100 MCG tablet Take 1 tablet (100 mcg total) by mouth daily before breakfast.   omeprazole (PRILOSEC) 20 MG capsule Take 20 mg by mouth daily.   spironolactone (ALDACTONE) 25 MG tablet Take 25 mg by mouth daily.   thiamine (VITAMIN B-1) 100 MG tablet Take 100 mg by mouth daily.   urea (CARMOL) 40 % CREA Apply 1 application topically 2 (two) times daily. Apply to sore areas on hands and feet   No facility-administered encounter medications on file as of 09/22/2019.    PHYSICAL EXAM:   Limited 2/2 tele-health audio only visit. Patient is A &O; pleasantly conversant. Easily conversant without dyspnea. Stephen Handler, NP

## 2019-09-28 MED FILL — AFINITOR 10 MG TABLET: ORAL | 28 days supply | Qty: 28 | Fill #1

## 2019-09-28 MED FILL — AFINITOR 10 MG TABLET: 28 days supply | Qty: 28 | Fill #1 | Status: AC

## 2019-10-14 ENCOUNTER — Other Ambulatory Visit: Admit: 2019-10-14 | Discharge: 2019-10-14 | Payer: MEDICARE

## 2019-10-14 ENCOUNTER — Ambulatory Visit: Admit: 2019-10-14 | Discharge: 2019-10-14 | Payer: MEDICARE

## 2019-10-14 ENCOUNTER — Ambulatory Visit: Admit: 2019-10-14 | Discharge: 2019-10-14 | Payer: MEDICARE | Attending: Family | Primary: Family

## 2019-10-14 DIAGNOSIS — I219 Acute myocardial infarction, unspecified: Principal | ICD-10-CM

## 2019-10-14 DIAGNOSIS — C779 Secondary and unspecified malignant neoplasm of lymph node, unspecified: Principal | ICD-10-CM

## 2019-10-14 DIAGNOSIS — E039 Hypothyroidism, unspecified: Principal | ICD-10-CM

## 2019-10-14 DIAGNOSIS — C649 Malignant neoplasm of unspecified kidney, except renal pelvis: Principal | ICD-10-CM

## 2019-10-14 DIAGNOSIS — R7989 Other specified abnormal findings of blood chemistry: Principal | ICD-10-CM

## 2019-10-16 ENCOUNTER — Ambulatory Visit: Admit: 2019-10-16 | Discharge: 2019-10-17 | Payer: MEDICARE

## 2019-10-16 DIAGNOSIS — N183 Chronic kidney disease, stage 3 (moderate): Principal | ICD-10-CM

## 2019-10-16 DIAGNOSIS — N179 Acute kidney failure, unspecified: Principal | ICD-10-CM

## 2019-10-16 DIAGNOSIS — K7460 Unspecified cirrhosis of liver: Principal | ICD-10-CM

## 2019-10-16 DIAGNOSIS — I10 Essential (primary) hypertension: Principal | ICD-10-CM

## 2019-10-16 DIAGNOSIS — R7989 Other specified abnormal findings of blood chemistry: Principal | ICD-10-CM

## 2019-10-16 DIAGNOSIS — C641 Malignant neoplasm of right kidney, except renal pelvis: Principal | ICD-10-CM

## 2019-10-22 MED FILL — AFINITOR 10 MG TABLET: ORAL | 28 days supply | Qty: 28 | Fill #2

## 2019-10-22 MED FILL — AFINITOR 10 MG TABLET: 28 days supply | Qty: 28 | Fill #2 | Status: AC

## 2019-10-30 ENCOUNTER — Ambulatory Visit: Admit: 2019-10-30 | Discharge: 2019-10-31 | Payer: MEDICARE

## 2019-11-05 ENCOUNTER — Telehealth: Admit: 2019-11-05 | Discharge: 2019-11-06 | Payer: MEDICARE | Attending: Clinical | Primary: Clinical

## 2019-11-05 DIAGNOSIS — C779 Secondary and unspecified malignant neoplasm of lymph node, unspecified: Principal | ICD-10-CM

## 2019-11-05 DIAGNOSIS — C649 Malignant neoplasm of unspecified kidney, except renal pelvis: Secondary | ICD-10-CM

## 2019-11-12 ENCOUNTER — Telehealth
Admit: 2019-11-12 | Discharge: 2019-11-13 | Payer: MEDICARE | Attending: Pharmacist Clinician (PhC)/ Clinical Pharmacy Specialist | Primary: Pharmacist Clinician (PhC)/ Clinical Pharmacy Specialist

## 2019-11-12 DIAGNOSIS — C649 Malignant neoplasm of unspecified kidney, except renal pelvis: Principal | ICD-10-CM

## 2019-11-12 DIAGNOSIS — R600 Localized edema: Principal | ICD-10-CM

## 2019-11-13 ENCOUNTER — Ambulatory Visit: Admit: 2019-11-13 | Discharge: 2019-11-14 | Payer: MEDICARE

## 2019-11-19 MED FILL — AFINITOR 10 MG TABLET: ORAL | 28 days supply | Qty: 28 | Fill #3

## 2019-11-19 MED FILL — AFINITOR 10 MG TABLET: 28 days supply | Qty: 28 | Fill #3 | Status: AC

## 2019-12-09 ENCOUNTER — Telehealth: Payer: Self-pay

## 2019-12-09 NOTE — Telephone Encounter (Signed)
Volunteer support call for palliative care, message left 

## 2019-12-10 ENCOUNTER — Ambulatory Visit: Admit: 2019-12-10 | Discharge: 2019-12-11 | Payer: MEDICARE

## 2019-12-10 DIAGNOSIS — I10 Essential (primary) hypertension: Principal | ICD-10-CM

## 2019-12-10 DIAGNOSIS — C641 Malignant neoplasm of right kidney, except renal pelvis: Principal | ICD-10-CM

## 2019-12-10 DIAGNOSIS — K7460 Unspecified cirrhosis of liver: Principal | ICD-10-CM

## 2019-12-10 DIAGNOSIS — I5021 Acute systolic (congestive) heart failure: Principal | ICD-10-CM

## 2019-12-10 DIAGNOSIS — R600 Localized edema: Principal | ICD-10-CM

## 2019-12-10 DIAGNOSIS — R7989 Other specified abnormal findings of blood chemistry: Principal | ICD-10-CM

## 2019-12-10 DIAGNOSIS — N1831 Stage 3a chronic kidney disease: Principal | ICD-10-CM

## 2019-12-10 MED ORDER — FUROSEMIDE 40 MG TABLET: 40 mg | Freq: Two times a day (BID) | 0 refills | 0 days

## 2019-12-16 ENCOUNTER — Ambulatory Visit: Admit: 2019-12-16 | Discharge: 2019-12-17 | Payer: MEDICARE

## 2019-12-16 ENCOUNTER — Ambulatory Visit: Admit: 2019-12-16 | Discharge: 2019-12-17 | Payer: MEDICARE | Attending: Family | Primary: Family

## 2019-12-16 DIAGNOSIS — C649 Malignant neoplasm of unspecified kidney, except renal pelvis: Secondary | ICD-10-CM

## 2019-12-16 DIAGNOSIS — C779 Secondary and unspecified malignant neoplasm of lymph node, unspecified: Principal | ICD-10-CM

## 2019-12-22 MED FILL — AFINITOR 10 MG TABLET: ORAL | 28 days supply | Qty: 28 | Fill #4

## 2019-12-22 MED FILL — AFINITOR 10 MG TABLET: 28 days supply | Qty: 28 | Fill #4 | Status: AC

## 2019-12-24 ENCOUNTER — Other Ambulatory Visit: Payer: Medicare Other | Admitting: Internal Medicine

## 2019-12-24 ENCOUNTER — Other Ambulatory Visit: Payer: Self-pay

## 2019-12-24 ENCOUNTER — Encounter: Payer: Self-pay | Admitting: Internal Medicine

## 2019-12-24 DIAGNOSIS — Z515 Encounter for palliative care: Secondary | ICD-10-CM

## 2019-12-24 DIAGNOSIS — Z7189 Other specified counseling: Secondary | ICD-10-CM

## 2019-12-24 NOTE — Progress Notes (Signed)
June 3rd, 2021 [facility] Palliative Care Consult Note Telephone: [phone]  Fax: [phone]   Due to the current COVID-19 infection/crises, the family prefer, and have given their verbal consent for,a provider visit via telemedicine. HIPPA policies of confidentially were discussed. Video-audio (telehealth) contact was unable to be done due technical barriers from the patient's side.   PATIENT NAME: Stephen Grimes DOB: March 17, 1951 MRN: [id number] [address] [place] [address],  Phone: [phone], [phone], [phone] (may be spouse's M)   Email is: [email or web address],  alternative:  [email or web address]   PRIMARY CARE PROVIDER:   Seward Carol, MD   REFERRING PROVIDER:  Seward Carol, MD [address], [place] [address]    Dr. Atilano Ina FNP (Hematology and Oncology [facility] [phone])   RESPONSIBLE PARTY:   Minette Brine is Niece [name] at [phone]; e-mail: [email or web address].  (spouse) Shirlene (some cognitive deficits)   RECOMMENDATIONS and PLAN:  1. Advance Care Planning:               A. Directives: reviewed again today; confirmed patient's wishes for full recitative efforts in the event of a cardiopulmonary arrest ("as long as I can still wink, keep working on me!)               B. Goals of Care: Continuation of treatment of metastatic renal carcinoma. Followed by [place]. Recent OV 12/16/2019 went well; all stable.   2. Functional status: Patient continues to feel he is doing very well, without significant s/e of chemo. He is in his usual good spirits. Has tolerated the same chemo pill well for several years. He continues to  maintain his weight at 148-150lb. At a height of 5'7" his BMI is 23 kg/m2. 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 Patient denies symptoms of dyspnea, LE edema, or chest pain. Not needing to  take his Lactulose as bowel movements regular. No symptoms suggestive of encephalopathy from his liver cirrhosis.    3. Care giver supports: Patient resides with wife Shirlene who has mild cognitive decline. Patient is needing to help her a little more in her ADLs. She doesn't wander and is safe to leave home alone. Many involved family members. Patient copes with his illness by not dwelling on it, even though he deals with it. He feels lucky isn't bother with significant symptoms, so his illness is not in the forefront of his thoughts. 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    4. Follow up: Call to f/u in 2-3 months (around Sept 2021). Not scheduled; just call and see if you can catch him at work. Usually it's okay to talk He has my contact information should he wish to be seen sooner.    I spent 30 minutes providing this consultation from 10 to 10:30am. More than 50% of the time in this consultation was spent coordinating communication.    HISTORY OF PRESENT ILLNESS:  Stephen Grimes is a 69 y.o. male with h/o  metastatic renal carcinoma -R nephrectomy 2015 -current single agent 04/2019 -Tavazanib discontinued d/t Takotsubo syndrome with decreased EF, now recovered  OSA, HTN, COPD, hypothyroidism, alcoholic cirrhosis (Lactulose; no further ETOH use); encephalopathy.  -Hospitalization 4/26-11/23/2018, where he was treated for hypoxic respiratory failure (intubation; hypoxia completely resolved by time of discharge), encephalopathy/unresponsive episode (resolved). He was found to have Takotsubo cardiomyopathy (EF 15-20%) with acute on chronic kidney disease r/t hypovolemia. F/U  Echo July 2020 normalization of EF to 60%. H/O asymmetric LE edema (no DVT doppler 2020), ventral hernia, hypothyroidism (Synthroid), GERD, and malnutrition. EGD 04/28/2019 (Dr. Warnell Bureau): Esophageal varices without bleeding; diaphragmatic hernia This is a Palliative Care Visit f/u from 09/22/2019.   CODE STATUS: Full Code   PPS:  90%    HOSPICE ELIGIBILITY/DIAGNOSIS: TBD  PAST MEDICAL HISTORY:  Past Medical History:  Diagnosis Date  . Alcoholic cirrhosis ([place])   . Cancer [facility])    kidney cancer  . Cirrhosis ([place])   . COPD (chronic obstructive pulmonary disease) ([place])   . Coronary artery disease   . HTN (hypertension)   . Hypertension   . Metastatic renal cell carcinoma ([place])   . Renal cancer ([place])   . Renal disorder   . Renal impairment   . Thrombocytopathia ([place])   . Thrombocytopenia ([place]) 12/11/2011  . Thyroid disease     SOCIAL HX:  Social History   Tobacco Use  . Smoking status: Never Smoker  . Smokeless tobacco: Never Used  Substance Use Topics  . Alcohol use: No    ALLERGIES: No Known Allergies   PERTINENT MEDICATIONS:  Outpatient Encounter Medications as of 12/24/2019  Medication Sig  . aspirin 81 MG chewable tablet Chew 1 tablet (81 mg total) by mouth daily with breakfast.  . furosemide (LASIX) 40 MG tablet Take 40 mg by mouth daily.   Marland Kitchen lactulose (CHRONULAC) 10 GM/15ML solution Take 20 g by mouth 2 (two) times daily.  Marland Kitchen levothyroxine (SYNTHROID) 100 MCG tablet Take 1 tablet (100 mcg total) by mouth daily before breakfast.  . omeprazole (PRILOSEC) 20 MG capsule Take 20 mg by mouth daily.  Marland Kitchen spironolactone (ALDACTONE) 25 MG tablet Take 25 mg by mouth daily.  Marland Kitchen thiamine (VITAMIN B-1) 100 MG tablet Take 100 mg by mouth daily.  . urea (CARMOL) 40 % CREA Apply 1 application topically 2 (two) times daily. Apply to sore areas on hands and feet   No facility-administered encounter medications on file as of 12/24/2019.    PHYSICAL EXAM:   Limited 2/2 tele-health audio only visit. Patient is A &O; pleasantly conversant. Easily conversant without dyspnea. [address], [place] Skyylar Kopf, NP

## 2020-01-18 MED FILL — AFINITOR 10 MG TABLET: ORAL | 28 days supply | Qty: 28 | Fill #5

## 2020-01-18 MED FILL — AFINITOR 10 MG TABLET: 28 days supply | Qty: 28 | Fill #5 | Status: AC

## 2020-01-27 ENCOUNTER — Ambulatory Visit: Admit: 2020-01-27 | Discharge: 2020-01-28 | Payer: MEDICARE

## 2020-01-27 ENCOUNTER — Other Ambulatory Visit: Admit: 2020-01-27 | Discharge: 2020-01-28 | Payer: MEDICARE

## 2020-01-27 ENCOUNTER — Ambulatory Visit: Admit: 2020-01-27 | Discharge: 2020-01-28 | Payer: MEDICARE | Attending: Family | Primary: Family

## 2020-01-27 DIAGNOSIS — N179 Acute kidney failure, unspecified: Principal | ICD-10-CM

## 2020-01-27 DIAGNOSIS — C779 Secondary and unspecified malignant neoplasm of lymph node, unspecified: Principal | ICD-10-CM

## 2020-01-27 DIAGNOSIS — C649 Malignant neoplasm of unspecified kidney, except renal pelvis: Principal | ICD-10-CM

## 2020-01-27 DIAGNOSIS — K7460 Unspecified cirrhosis of liver: Principal | ICD-10-CM

## 2020-02-15 MED FILL — AFINITOR 10 MG TABLET: 28 days supply | Qty: 28 | Fill #6 | Status: AC

## 2020-02-15 MED FILL — AFINITOR 10 MG TABLET: ORAL | 28 days supply | Qty: 28 | Fill #6

## 2020-02-25 ENCOUNTER — Ambulatory Visit: Admit: 2020-02-25 | Discharge: 2020-02-26 | Payer: MEDICARE

## 2020-02-25 DIAGNOSIS — C641 Malignant neoplasm of right kidney, except renal pelvis: Principal | ICD-10-CM

## 2020-02-25 DIAGNOSIS — N1831 Stage 3a chronic kidney disease: Principal | ICD-10-CM

## 2020-02-25 DIAGNOSIS — K7460 Unspecified cirrhosis of liver: Principal | ICD-10-CM

## 2020-02-25 DIAGNOSIS — I10 Essential (primary) hypertension: Principal | ICD-10-CM

## 2020-02-25 MED ORDER — UREA 40 % TOPICAL CREAM
Freq: Two times a day (BID) | TOPICAL | 1 refills | 0.00000 days | Status: CP
Start: 2020-02-25 — End: 2021-02-24

## 2020-03-09 ENCOUNTER — Other Ambulatory Visit: Admit: 2020-03-09 | Discharge: 2020-03-10 | Payer: MEDICARE

## 2020-03-09 ENCOUNTER — Ambulatory Visit: Admit: 2020-03-09 | Discharge: 2020-03-10 | Payer: MEDICARE | Attending: Family | Primary: Family

## 2020-03-09 DIAGNOSIS — C652 Malignant neoplasm of left renal pelvis: Principal | ICD-10-CM

## 2020-03-09 DIAGNOSIS — K7460 Unspecified cirrhosis of liver: Principal | ICD-10-CM

## 2020-03-09 DIAGNOSIS — I21A9 Other myocardial infarction type: Principal | ICD-10-CM

## 2020-03-09 DIAGNOSIS — C779 Secondary and unspecified malignant neoplasm of lymph node, unspecified: Principal | ICD-10-CM

## 2020-03-09 DIAGNOSIS — E039 Hypothyroidism, unspecified: Principal | ICD-10-CM

## 2020-03-09 DIAGNOSIS — C649 Malignant neoplasm of unspecified kidney, except renal pelvis: Principal | ICD-10-CM

## 2020-03-17 MED FILL — AFINITOR 10 MG TABLET: 28 days supply | Qty: 28 | Fill #7 | Status: AC

## 2020-03-17 MED FILL — AFINITOR 10 MG TABLET: ORAL | 28 days supply | Qty: 28 | Fill #7

## 2020-03-24 ENCOUNTER — Other Ambulatory Visit: Payer: Medicare Other | Admitting: Nurse Practitioner

## 2020-03-24 ENCOUNTER — Other Ambulatory Visit: Payer: Self-pay

## 2020-03-24 DIAGNOSIS — C649 Malignant neoplasm of unspecified kidney, except renal pelvis: Secondary | ICD-10-CM

## 2020-03-24 DIAGNOSIS — Z515 Encounter for palliative care: Secondary | ICD-10-CM

## 2020-03-24 NOTE — Progress Notes (Signed)
[facility] Consult Note Telephone: [phone]  Fax: [phone]  PATIENT NAME: Stephen Grimes [address] [place] [address] [phone] (home)  DOB: Dec 09, 1950 MRN: [id number]  PRIMARY CARE PROVIDER:    Seward Carol, MD,  [place] [address] [place] [address] [phone]  REFERRING PROVIDER:   Seward Carol, MD [address] [place],  [place] [address] [phone]  RESPONSIBLE PARTY:   Extended Emergency Contact Information Primary Emergency Contact: Divito,Shirlene Address: [address]          [place], [place] [address] Johnnette Litter of [place] Phone: [phone] Mobile Phone: [phone] Relation: Spouse  RESPONSIBLE PARTY:Contact is Niece [name] at [phone]; e-mail:[email or web address].(spouse)Shirlene(some cognitive deficits)  Patient prefer and gave verbal consent for a provider visit vis telemedicine. HPPA policies of confidentiality were discussed.  RECOMMENDATIONS and PLAN: 1.Advance Care Planning:  A.Directives: Patient code status is full resuscitation effort. Reiterated his desire to be resuscitated in the event of cardiac or respiratory arrest.   B. Goals of Care:Continuation of treatment of metastatic renal carcinoma. Followed by [facility]. Last vist 03/09/2020  2. Functional status: Patient continues to feel he is doing very well, without significant side effect from chemo. He sounded well and appeared to be in good spirits. He endorsed positive attitude towards life, taking one day at a time. He report ongoing tolerance of chemo pill which he has been on for several years. He report stable weights. He continues working in Garment/textile technologist role, he is self employed owns a Therapist, sports. Patient denies symptoms of dyspnea or edema, endorsed bilateral LE edema. Report reduction in his Lasix dose from  20mg  to 10mg  daily by his PCP and discontinuation of Spirolactone due to decline in his renal function. He has notified his PCP about the increasing edema and had others for repeat labs to re-evaluate is labs in order to adjust his meds. Patient report struggling with creating a balance with his diet and his medication. We discussed limiting his salt intake and possibility of having consult with nutritionist.  3.Care giver supports: Patient resides with wife Shirlene who has mild cognitive decline. Patient stills works, and tries not to do too much. He does not need assistance with ADLs, report feeling well overall. No indication of encephalopathy from alcoholic cirrhosis.  4. Follow up: Palliative care will continue to follow for goals of care clarification and symptom management follow up call schedule for 4 weeks. Gave him my contact should he need to be seen sooner.  I spent 30 minutes providing this consultation,  from 3:00pm to 3:30pm. More than 50% of the time in this consultation was spent coordinating communication.   HISTORY OF PRESENT ILLNESS:  Stephen Grimes is a 69 y.o. year old male with multiple medical problems including metastatic renal carcinoma s/p Nephrectomy, OSA, HTN, Hypothyroidism, alcoholic cirrhosis. Palliative Care was asked to follow this patient by consultation request of Seward Carol, MD to help address advance care planning and goals of care. This is a follow up visit, last visit was on 12/24/2019.  CODE STATUS: Full  PPS: 90%  HOSPICE ELIGIBILITY/DIAGNOSIS: TBD  PAST MEDICAL HISTORY:  Past Medical History:  Diagnosis Date  . Alcoholic cirrhosis ([place])   . Cancer [facility])    kidney cancer  . Cirrhosis ([place])   . COPD (chronic obstructive pulmonary disease) ([place])   . Coronary artery disease   . HTN (hypertension)   . Hypertension   .  Metastatic renal cell carcinoma ([place])   . Renal cancer ([place])   . Renal disorder   . Renal impairment   . Thrombocytopathia  ([place])   . Thrombocytopenia ([place]) 12/11/2011  . Thyroid disease     SOCIAL HX:  Social History   Tobacco Use  . Smoking status: Never Smoker  . Smokeless tobacco: Never Used  Substance Use Topics  . Alcohol use: No   FAMILY HX: No family history on file.  ALLERGIES: No Known Allergies   PERTINENT MEDICATIONS:  Outpatient Encounter Medications as of 03/24/2020  Medication Sig  . aspirin 81 MG chewable tablet Chew 1 tablet (81 mg total) by mouth daily with breakfast.  . furosemide (LASIX) 40 MG tablet Take 40 mg by mouth daily.   Marland Kitchen lactulose (CHRONULAC) 10 GM/15ML solution Take 20 g by mouth 2 (two) times daily.  Marland Kitchen levothyroxine (SYNTHROID) 100 MCG tablet Take 1 tablet (100 mcg total) by mouth daily before breakfast.  . omeprazole (PRILOSEC) 20 MG capsule Take 20 mg by mouth daily.  Marland Kitchen spironolactone (ALDACTONE) 25 MG tablet Take 25 mg by mouth daily.  Marland Kitchen thiamine (VITAMIN B-1) 100 MG tablet Take 100 mg by mouth daily.  . urea (CARMOL) 40 % CREA Apply 1 application topically 2 (two) times daily. Apply to sore areas on hands and feet   No facility-administered encounter medications on file as of 03/24/2020.     ROS:   Current and past weights: 143lbs, BMI 22.4   Patient approprite and coherent. Review of systems -ve except for bilateral LLE edema.  Jari Favre DNP, AGPCNP-BC

## 2020-04-05 MED ORDER — TRIAMCINOLONE ACETONIDE 0.1 % TOPICAL CREAM
0 refills | 0 days | Status: CP
Start: 2020-04-05 — End: ?

## 2020-04-12 DIAGNOSIS — C649 Malignant neoplasm of unspecified kidney, except renal pelvis: Principal | ICD-10-CM

## 2020-04-12 DIAGNOSIS — C652 Malignant neoplasm of left renal pelvis: Principal | ICD-10-CM

## 2020-04-12 MED FILL — AFINITOR 10 MG TABLET: 28 days supply | Qty: 28 | Fill #8 | Status: AC

## 2020-04-12 MED FILL — AFINITOR 10 MG TABLET: ORAL | 28 days supply | Qty: 28 | Fill #8

## 2020-04-15 DIAGNOSIS — C649 Malignant neoplasm of unspecified kidney, except renal pelvis: Principal | ICD-10-CM

## 2020-04-15 DIAGNOSIS — C779 Secondary and unspecified malignant neoplasm of lymph node, unspecified: Principal | ICD-10-CM

## 2020-04-20 ENCOUNTER — Other Ambulatory Visit: Admit: 2020-04-20 | Discharge: 2020-04-20 | Payer: MEDICARE

## 2020-04-20 ENCOUNTER — Ambulatory Visit: Admit: 2020-04-20 | Discharge: 2020-04-20 | Payer: MEDICARE

## 2020-04-20 ENCOUNTER — Ambulatory Visit: Admit: 2020-04-20 | Discharge: 2020-04-20 | Payer: MEDICARE | Attending: Family | Primary: Family

## 2020-04-20 DIAGNOSIS — C641 Malignant neoplasm of right kidney, except renal pelvis: Principal | ICD-10-CM

## 2020-04-20 DIAGNOSIS — C649 Malignant neoplasm of unspecified kidney, except renal pelvis: Principal | ICD-10-CM

## 2020-04-20 DIAGNOSIS — E039 Hypothyroidism, unspecified: Principal | ICD-10-CM

## 2020-04-20 MED ORDER — LEVOTHYROXINE 100 MCG TABLET
ORAL_TABLET | Freq: Every day | ORAL | 3 refills | 30.00000 days
Start: 2020-04-20 — End: ?

## 2020-04-20 MED ORDER — DULOXETINE 30 MG CAPSULE,DELAYED RELEASE
ORAL_CAPSULE | Freq: Every day | ORAL | 2 refills | 30 days | Status: CP
Start: 2020-04-20 — End: 2021-04-20

## 2020-05-09 DIAGNOSIS — C649 Malignant neoplasm of unspecified kidney, except renal pelvis: Principal | ICD-10-CM

## 2020-05-09 MED FILL — AFINITOR 10 MG TABLET: ORAL | 28 days supply | Qty: 28 | Fill #9

## 2020-05-09 MED FILL — AFINITOR 10 MG TABLET: 28 days supply | Qty: 28 | Fill #9 | Status: AC

## 2020-05-12 ENCOUNTER — Institutional Professional Consult (permissible substitution)
Admit: 2020-05-12 | Discharge: 2020-05-13 | Payer: MEDICARE | Attending: Pharmacist Clinician (PhC)/ Clinical Pharmacy Specialist | Primary: Pharmacist Clinician (PhC)/ Clinical Pharmacy Specialist

## 2020-05-12 DIAGNOSIS — I214 Non-ST elevation (NSTEMI) myocardial infarction: Principal | ICD-10-CM

## 2020-05-12 DIAGNOSIS — C779 Secondary and unspecified malignant neoplasm of lymph node, unspecified: Principal | ICD-10-CM

## 2020-05-12 DIAGNOSIS — C649 Malignant neoplasm of unspecified kidney, except renal pelvis: Principal | ICD-10-CM

## 2020-05-12 MED ORDER — TRIAMCINOLONE ACETONIDE 0.1 % TOPICAL CREAM
0 refills | 0 days | Status: CP
Start: 2020-05-12 — End: ?

## 2020-05-12 MED ORDER — UREA 40 % TOPICAL CREAM
Freq: Two times a day (BID) | TOPICAL | 1 refills | 0 days | Status: CP
Start: 2020-05-12 — End: ?

## 2020-05-13 ENCOUNTER — Ambulatory Visit: Admit: 2020-05-13 | Discharge: 2020-05-14 | Payer: MEDICARE

## 2020-05-13 ENCOUNTER — Other Ambulatory Visit: Admit: 2020-05-13 | Discharge: 2020-05-14 | Payer: MEDICARE

## 2020-05-13 DIAGNOSIS — C779 Secondary and unspecified malignant neoplasm of lymph node, unspecified: Principal | ICD-10-CM

## 2020-05-13 DIAGNOSIS — C649 Malignant neoplasm of unspecified kidney, except renal pelvis: Principal | ICD-10-CM

## 2020-05-13 DIAGNOSIS — I214 Non-ST elevation (NSTEMI) myocardial infarction: Principal | ICD-10-CM

## 2020-05-25 ENCOUNTER — Institutional Professional Consult (permissible substitution)
Admit: 2020-05-25 | Discharge: 2020-05-26 | Payer: MEDICARE | Attending: Pharmacist Clinician (PhC)/ Clinical Pharmacy Specialist | Primary: Pharmacist Clinician (PhC)/ Clinical Pharmacy Specialist

## 2020-05-25 DIAGNOSIS — C649 Malignant neoplasm of unspecified kidney, except renal pelvis: Principal | ICD-10-CM

## 2020-05-25 NOTE — Unmapped (Signed)
Clinical Pharmacist Practitioner: [facility] -Telephone Encounter    I spent 15 minutes on the phone with the patient. I spent an additional 15 minutes on pre- and post-visit activities.     The patient was physically located in [place] or a state in which I am permitted to provide care. The patient and/or parent/guardian understood that s/he may incur co-pays and cost sharing, and agreed to the telemedicine visit. The visit was reasonable and appropriate under the circumstances given the patient's presentation at the time.    The patient and/or parent/guardian has been advised of the potential risks and limitations of this mode of treatment (including, but not limited to, the absence of in-person examination) and has agreed to be treated using telemedicine. The patient's/patient's family's questions regarding telemedicine have been answered.     If the visit was completed in an ambulatory setting, the patient and/or parent/guardian has also been advised to contact their provider???s office for worsening conditions, and seek emergency medical treatment and/or call 911 if the patient deems either necessary.    Patient Name: Sergio Horton  Patient Age: 69 y.o.    Assessment and recommendations:  69 y.o.M with alcoholic cirrhosis, HTN, and h/o pT3a ccRCC s/p 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 He has progressed through Sunitnib, Nivolumab + Ipilimumab via OMNIVORE clinical trial, Cabozantinib, and is now on treatment with Lenvatinib + everolimus. Combination therapy continued until 11/17/2018 when he was diagnosed with??Takotsubo syndrome. He has now recovered, including his EF, and restarted just single agent everolimus 10 mg daily.     1. Metastatic ccRCC- tolerating everolimus monotherapy well since restarting in 01/2020 and denies any issues related to therapy. Most recent labs on 04/20/20 show mildly elevated LFTs. Imaging shows overall stable disease. Glucose WNL. Repeat A1c and Lipid panel for annual monitoring  - Continue everolimus 10 mg daily  - Repeat A1c and Lipid Panel    2. LE edema- reports swelling of both legs but is improved since we last spoke. Is PCP increased his furosemide from 20 mg daily to 40 mg daily.  - Increased furosemide to 40 mg PO daily per PCP and continues spironolactone 25 mg PO daily    3. Cough- Reports his cough is improved since last visit. Most recent pro-BNP was elevated at 1140 pg/ml on 04/20/20. Denies chest pain or shortness of breath.   - chest x-ray 05/13/20 clear    4. Rash- reports scaly and dry rash around lower ankles. Uses Udder cream and and triamcinolone with improvement   - Continue triamcinolone and urea 40% cream    5. ZOX0RU0- estimated eGFR of ~30 and Scr 1.91 on 05/13/20. Multiple episodes of AKI in 2020 (baseline Scr of 2.1-2.3).  - Continues to follow with nephrology  - Next appointment scheduled for 06/28/20    6. HTN- report home BPs to be stable, averaging in the 130/72s. Denies any dizziness or lightheadedness.  - Continue amlodipine 2.5 mg PO daily   ??  7. Stress/anxiety/depression- was initiated on duloxetine 30 mg daily due to feeling of additional stress and wife's decline in health. Took 1 dose and stopped after feeling nauseous and queasiness to stomach.  - CTM    Follow- up:??[name] on 06/01/20; CPP in 2 weeks pending chest x-ray results _______________________________________________________________________________________    Reason for visit:  Everolimus monitoring and side effect management    Current Drug and Dose: Everolimus 10 mg daily    Date of Initiation: Initial start date 02/16/2019 (held 03/11/2019-04/10/19 2/2 neutropenia)  Interval History:  Mr. Robinson is overall doing fairly well. He continues to be tolerating everolimus therapy. He reports that his cough has improved since last week and is much better, he says he is just having a normal amount of coughing now. Reports continued issue of lower leg edema starting from the knee down to ankles on both legs but has improved since we last spoke, swelling has gone down and is close to normal. His dry scaly rash has improved with urea and triamcinolone cream. He still has some soreness in his legs from his knee down to ankle but not very bothersome. He saw his family doctor this morning who increased his lasix dose from 20 mg daily to 40 mg daily. Otherwise, no additional new symptoms including stomatitis, diarrhea, nail or skin changes, fatigue, or nausea/vomiting.     Adherence: Takes as prescribed at 0830 with no missed doses    Drug Interactions None identified     Oncology History Overview Note   Previous Treatments:  Sunitnib (07/27/2016-12/03/2016)  Nivolumab+Ipilimumab (2 cycles)/ Nivo via OMNIVORE clinical trial (01/21/2017-05/29/2017)  Cabozantinib (07/08/2017-07/22/2018)  Lenvatinib +everolimus (07/24/2018-11/17/2018)    History for metastatic ccRCC  06/2014:  right nephrectomy; Pathology: pT3a ccRCC; 11.5cm grade 2  ???  Post-op course was complicated by respiratory failure requiring ECMO. He was in the hospital for a month after his surgery.    06/2015: surveillance imaging with recurrence     07/2015: Suntinib started   ??? Dose reduced 08/23/2015, 2/2, neutropenia   ??? 12/03/2016: Suntinib discontinued 2/2 POD    01/07/2017: OMNIVORE clinical trial  ??? 2 cycles of Ipilimuab with PD; 04/2017  ??? 06/24/2017: discontinuance 2/2 POD    07/03/2017: Cabozantinib started 40mg  given cirrhotic liver   ??? Dose reduced 20mg  12/2017, 2/2 constellation of symptoms including, fatigue, PPE, appetite loss, intermittent elevations in LFTs, HTN  ??? 06/2018- Discontinued 2/2 POD    07/24/2018: Lenvatinib + everolimus started   ??? Held 09/06/2018-10/05/2018, 2/2 hospitalization for PNA  ??? Held/discontinued 11/15/2018, 2/2 Hospitalization for STEMI    11/23/2018: Discharged from hospital with palliative care; EF 20% r/t Takotsubo Cardiomyopathy     01/30/2019: Repeat EF 50% on ECHO; patient fully recovered    04/2019: restarted on single agent Everolimus          Vital Signs for this encounter:  There were no vitals taken for this visit.  Wt Readings from Last 3 Encounters:   04/20/20 64.9 kg (143 lb 1.6 oz)   03/09/20 67.3 kg (148 lb 4.8 oz)   02/25/20 68.4 kg (150 lb 14.4 oz)     Medications:  Current Outpatient Medications   Medication Sig Dispense Refill   ??? amLODIPine (NORVASC) 2.5 MG tablet Take 1 tablet by mouth daily.     ??? aspirin (ECOTRIN) 81 MG tablet Take 81 mg by mouth daily.     ??? DULoxetine (CYMBALTA) 30 MG capsule Take 1 capsule (30 mg total) by mouth daily. 30 capsule 2   ??? everolimus, antineoplastic, (AFINITOR) 10 mg tablet Take 1 tablet (10 mg total) by mouth daily. 56 tablet 6   ??? furosemide (LASIX) 40 MG tablet Take 40 mg by mouth daily.      ??? levothyroxine (SYNTHROID) 100 MCG tablet Take 1 tablet (100 mcg total) by mouth daily. 30 tablet 3   ??? omeprazole (PRILOSEC) 20 MG capsule Take 1 capsule (20 mg total) by mouth daily. 90 capsule 0   ??? spironolactone (ALDACTONE) 25 MG tablet Take 1 tablet (25  mg total) by mouth daily. 90 tablet 0   ??? thiamine HCl, vitamin B1, (VITAMIN B-1) 250 MG tablet Take 100 mg by mouth daily.      ??? triamcinolone (KENALOG) 0.1 % cream Apply topically to affected areas (arms) two (2) times a day. 80 g 0   ??? urea (CARMOL) 40 % cream Apply 1 application topically Two (2) times a day. 25 g 1     No current facility-administered medications for this visit.     LABS:  Lab Results   Component Value Date    WBC 4.8 05/13/2020    HGB 9.1 (L) 05/13/2020    HCT 26.9 (L) 05/13/2020    PLT 208 05/13/2020       Lab Results   Component Value Date    NA 138 05/13/2020    K 3.4 05/13/2020    CL 108 (H) 05/13/2020    CO2 20.0 05/13/2020    BUN 42 (H) 05/13/2020    CREATININE 1.91 (H) 05/13/2020    GLU 114 05/13/2020    CALCIUM 8.8 05/13/2020    MG 2.3 05/13/2020    PHOS 3.5 07/19/2014       Lab Results   Component Value Date    BILITOT 0.3 05/13/2020    BILIDIR 0.20 09/25/2018    PROT 6.8 05/13/2020    ALBUMIN 2.8 (L) 05/13/2020    ALT 27 05/13/2020    AST 47 (H) 05/13/2020    ALKPHOS 176 (H) 05/13/2020       Lab Results   Component Value Date    LABPROT 12.9 (H) 07/16/2014    INR 1.06 12/24/2016    APTT 31.2 12/24/2016     I spent 15 minutes in direct patient care.    Laverna Peace PharmD, BCOP, CPP  Hematology/Oncology Pharmacist  P: [phone]

## 2020-05-26 NOTE — Unmapped (Signed)
[facility] Refill Coordination Note    Specialty Medication(s) to be Shipped:   Hematology/Oncology: Afinitor 10mg     Other medication(s) to be shipped: No additional medications requested for fill at this time     Sergio Horton, DOB: September 05, 1950  Phone: [phone] (home) [phone] (work)      All above HIPAA information was verified with patient.     Was a Nurse, learning disability used for this call? No    Completed refill call assessment today to schedule patient's medication shipment from the [facility] [phone]).       Specialty medication(s) and dose(s) confirmed: Regimen is correct and unchanged.   Changes to medications: increased Furosemide from 20mg  to 40mg   Changes to insurance: No  Questions for the pharmacist: No    Confirmed patient received Welcome Packet with first shipment. The patient will receive a drug information handout for each medication shipped and additional FDA Medication Guides as required.       DISEASE/MEDICATION-SPECIFIC INFORMATION        N/A    SPECIALTY MEDICATION ADHERENCE     Medication Adherence    Patient reported X missed doses in the last month: 0  Specialty Medication: Afinitor 10mg   Patient is on additional specialty medications: No  Informant: patient  Adherence tools used: medication list, directed education   Other adherence tool: morning routine   Support network for adherence: family member                Afinitor 10 mg: 19 days of medicine on hand         SHIPPING     Shipping address confirmed in Epic.     Delivery Scheduled: Yes, Expected medication delivery date: 06/10/20.     Medication will be delivered via UPS to the prescription address in Epic [place].    [name]   [facility] Specialty Technician

## 2020-06-01 ENCOUNTER — Ambulatory Visit: Admit: 2020-06-01 | Discharge: 2020-06-02 | Payer: MEDICARE | Attending: Family | Primary: Family

## 2020-06-01 ENCOUNTER — Other Ambulatory Visit: Admit: 2020-06-01 | Discharge: 2020-06-02 | Payer: MEDICARE

## 2020-06-01 DIAGNOSIS — I5021 Acute systolic (congestive) heart failure: Principal | ICD-10-CM

## 2020-06-01 DIAGNOSIS — C641 Malignant neoplasm of right kidney, except renal pelvis: Principal | ICD-10-CM

## 2020-06-01 DIAGNOSIS — C651 Malignant neoplasm of right renal pelvis: Principal | ICD-10-CM

## 2020-06-01 DIAGNOSIS — I219 Acute myocardial infarction, unspecified: Principal | ICD-10-CM

## 2020-06-01 DIAGNOSIS — E064 Drug-induced thyroiditis: Principal | ICD-10-CM

## 2020-06-01 DIAGNOSIS — C649 Malignant neoplasm of unspecified kidney, except renal pelvis: Principal | ICD-10-CM

## 2020-06-01 LAB — COMPREHENSIVE METABOLIC PANEL
ALBUMIN: 3.2 g/dL — ABNORMAL LOW (ref 3.4–5.0)
ALKALINE PHOSPHATASE: 195 U/L — ABNORMAL HIGH (ref 46–116)
ALT (SGPT): 29 U/L (ref 10–49)
ANION GAP: 11 mmol/L (ref 5–14)
AST (SGOT): 64 U/L — ABNORMAL HIGH (ref <=34)
BILIRUBIN TOTAL: 0.2 mg/dL — ABNORMAL LOW (ref 0.3–1.2)
BLOOD UREA NITROGEN: 34 mg/dL — ABNORMAL HIGH (ref 9–23)
BUN / CREAT RATIO: 15
CALCIUM: 9.2 mg/dL (ref 8.7–10.4)
CHLORIDE: 104 mmol/L (ref 98–107)
CO2: 23 mmol/L (ref 20.0–31.0)
CREATININE: 2.24 mg/dL — ABNORMAL HIGH
EGFR CKD-EPI AA MALE: 33 mL/min/{1.73_m2} — ABNORMAL LOW (ref >=60)
EGFR CKD-EPI NON-AA MALE: 28 mL/min/{1.73_m2} — ABNORMAL LOW (ref >=60)
GLUCOSE RANDOM: 126 mg/dL (ref 70–179)
POTASSIUM: 3.7 mmol/L (ref 3.4–4.5)
PROTEIN TOTAL: 7.2 g/dL (ref 5.7–8.2)
SODIUM: 138 mmol/L (ref 135–145)

## 2020-06-01 LAB — CBC W/ AUTO DIFF
BASOPHILS ABSOLUTE COUNT: 0 10*9/L (ref 0.0–0.1)
BASOPHILS RELATIVE PERCENT: 0.2 %
EOSINOPHILS ABSOLUTE COUNT: 0.1 10*9/L (ref 0.0–0.4)
EOSINOPHILS RELATIVE PERCENT: 2.7 %
HEMATOCRIT: 29.2 % — ABNORMAL LOW (ref 41.0–53.0)
HEMOGLOBIN: 9.5 g/dL — ABNORMAL LOW (ref 13.5–17.5)
LARGE UNSTAINED CELLS: 2 % (ref 0–4)
LYMPHOCYTES ABSOLUTE COUNT: 0.7 10*9/L — ABNORMAL LOW (ref 1.5–5.0)
LYMPHOCYTES RELATIVE PERCENT: 17.7 %
MEAN CORPUSCULAR HEMOGLOBIN CONC: 32.5 g/dL (ref 31.0–37.0)
MEAN CORPUSCULAR HEMOGLOBIN: 26.2 pg (ref 26.0–34.0)
MEAN CORPUSCULAR VOLUME: 80.5 fL (ref 80.0–100.0)
MEAN PLATELET VOLUME: 8.6 fL (ref 7.0–10.0)
MONOCYTES ABSOLUTE COUNT: 0.3 10*9/L (ref 0.2–0.8)
MONOCYTES RELATIVE PERCENT: 7.6 %
NEUTROPHILS ABSOLUTE COUNT: 2.9 10*9/L (ref 2.0–7.5)
NEUTROPHILS RELATIVE PERCENT: 70.2 %
PLATELET COUNT: 228 10*9/L (ref 150–440)
RED BLOOD CELL COUNT: 3.62 10*12/L — ABNORMAL LOW (ref 4.50–5.90)
RED CELL DISTRIBUTION WIDTH: 18.6 % — ABNORMAL HIGH (ref 12.0–15.0)
WBC ADJUSTED: 4.2 10*9/L — ABNORMAL LOW (ref 4.5–11.0)

## 2020-06-01 LAB — SLIDE REVIEW

## 2020-06-01 LAB — T4, FREE: FREE T4: 1.52 ng/dL (ref 0.89–1.76)

## 2020-06-01 LAB — MAGNESIUM: MAGNESIUM: 2.2 mg/dL (ref 1.6–2.6)

## 2020-06-01 LAB — TSH: THYROID STIMULATING HORMONE: 0.347 u[IU]/mL — ABNORMAL LOW (ref 0.550–4.780)

## 2020-06-01 NOTE — Unmapped (Signed)
duplicate

## 2020-06-01 NOTE — Unmapped (Signed)
Genitourinary Oncology Clinic Follow-Up Note      Patient Name: Sergio Horton  Encounter Date: 06/01/2020    Referring Physician: Renford Dills, [place], [facility])  Urologist: Gean Quint    Assessment/Plan:    69 y.o.M with alcoholic cirrhosis, HTN, and h/o pT3a ccRCC s/p 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 He has progressed through Sunitnib, Nivolumab + Ipilimumab via OMNIVORE clinical trial, Cabozantinib, and was on treatment with Lenvatinib + everolimus, until 11/17/2018, when he was diagnosed with Takotsubo syndrome. He has now recovered, including his EF, and restarted just single agent everolimus 10mg . We did not advise a re-challenge with TKI with cardiac dysfunction, therefore he is currently on single agent everolimus and doing well.       1. ccRCC:  ?? Current treatment: Everolimus 10mg  (02/16/2019- present).  ?? Tempus testing results below   ?? Imaging previously reviewed from 04/20/2020 with overall stable disease, very minimal growth. Given overall stability and good tolerability of everolimus, we elected to continue with therapy.     # Goals of Care: Sergio Horton was followed by a local palliative care service visit, last visit was 07/07/2019. He continues to remain full code status.   His neice [name] is his point of contact, at [phone]; e-mail: [email or web address]. (spouse) Shirlene (some cognitive deficits).     # Previous STEMI with Takotsubo syndrome- hospitalized on 4/27- 5/3- notes in Care everywhere. COVID ruled out, patient with elevated Trops and EKG changes. ECHO reported EF 15- 20%. Patient was discharged home on 11/23/2018. He has had a remarkable recovery. He follows with Cardiology Dr. Anne Fu  ?? Repeat ECHO 01/21/2019 reported EF 65%  ?? Pro-BNP 06/01/2020=pending     # Cirrhosis: Has intermittent baseline leukopenia and thrombocytopia  ?? Avoids tylenol  ?? Denies current alcohol use  ?? LFTs remain in his normal range  ?? Continues on Aldactone and lasix 40mg        # HTN: No issues at this time. Home readings are 130-145/60's.  ?? Continue amlodipine 2.5mg     # Elevated TSH: On synthroid, follows with PCP.   ?? Continue synthroid   ?? TSH/fT4 pending today     # Ventral hernia: easily reduces. Advised wearing abdominal binder if is becomes bothersome       # Baseline Elevated sCr: (baseline 2.2).  ?? Saw Dr. Austin Miles on 02/25/2020- no changes made in medications; see note for specifics. Has follow up 06/28/2020    # maintenance:  COVID vaccination UTD. Influenza received today     # Stress/anxiety/depression: he did not like cymbalta. Will continue good self care     # anemia of chronic disease: Hgb stable today 9.5  ?? Iron panel WNL on 03/2020; ferritin = 149    Plan Summary:   ?? Continue on everolimus 10mg  daily  ?? Continue to follow with Nephrology; next appt 12/7  ?? Follows with Dr. Nehemiah Settle (PCP)- will send my note via fax  ?? RTC in 07/27/2020 CT imaging, labs, visit     I personally spent 42 minutes face-to-face and non-face-to-face in the care of this patient, which includes all pre, intra, and post visit time on the date of service.        History of Present Illness:    Sergio Horton is a 69 y.o. male with h/o right nephrectomy and caval thrombectomy for ccRCC who is seen in follow-up for met ccRCC       Oncology History Overview Note   Previous Treatments:  Sunitnib (07/27/2016-12/03/2016)  Nivolumab+Ipilimumab (2 cycles)/ Nivo via OMNIVORE clinical trial (01/21/2017-05/29/2017)  Cabozantinib (07/08/2017-07/22/2018)  Lenvatinib +everolimus (07/24/2018-11/17/2018)    History for metastatic ccRCC  06/2014:  right nephrectomy; Pathology: pT3a ccRCC; 11.5cm grade 2  ???  Post-op course was complicated by respiratory failure requiring ECMO. He was in the hospital for a month after his surgery.    06/2015: surveillance imaging with recurrence     07/2015: Suntinib started   ??? Dose reduced 08/23/2015, 2/2, neutropenia   ??? 12/03/2016: Suntinib discontinued 2/2 POD    01/07/2017: OMNIVORE clinical trial  ??? 2 cycles of Ipilimuab with PD; 04/2017  ??? 06/24/2017: discontinuance 2/2 POD    07/03/2017: Cabozantinib started 40mg  given cirrhotic liver   ??? Dose reduced 20mg  12/2017, 2/2 constellation of symptoms including, fatigue, PPE, appetite loss, intermittent elevations in LFTs, HTN  ??? 06/2018- Discontinued 2/2 POD    07/24/2018: Lenvatinib + everolimus started   ??? Held 09/06/2018-10/05/2018, 2/2 hospitalization for PNA  ??? Held/discontinued 11/15/2018, 2/2 Hospitalization for STEMI    11/23/2018: Discharged from hospital with palliative care; EF 20% r/t Takotsubo Cardiomyopathy     01/30/2019: Repeat EF 50% on ECHO; patient fully recovered    04/2019: restarted on single agent Everolimus            Interval History:  Sergio Horton returns today in follow up and reports feeling good today. He is doing better with his home life issues. He continues to work.  His wife has not been doing well and he feels that he has many stressors in life currently. He also voices the opinion that he has been living with cancer now for 6 years, and this concerns him as he feels he has become a ticking clock, but is remaining optimistic as he passed his 51yr mark. He denies any SE's related to treatment. His edema has greatly improved. No cough, SOB. Reports good po intake.        No issues related to treatment. No new pain. UTD on COVID vaccination. Recently had a rash to bilateral upper extremities, but this has resolved.     Review of Systems: A 10 system review was completed and was negative except per HPI.      Medications Reviewed in EPIC  No Known Allergies      Physical Exam:  There were no vitals taken for this visit.  ECOG PS 1  GENERAL: Pleasant male in no acute distress.   HEENT: Normocephalic, atraumatic, extraocular muscles intact  CARDIOVASCULAR: S1/S2, systolic murmor  PULMONARY: Normal work of breathing, no use of accessory muscles  EXTREMITIES: No clubbing, cyanosis or edema.   NEUROLOGIC:  Cranial nerves II-XII grossly intact  PSYCHOLOGIC: Normal affect, normal mood  SKIN: Good color. No lesions.    Labs:   Lab Results   Component Value Date    WBC 4.2 (L) 06/01/2020    HGB 9.5 (L) 06/01/2020    HCT 29.2 (L) 06/01/2020    PLT 228 06/01/2020       Lab Results   Component Value Date    NA 138 05/13/2020    K 3.4 05/13/2020    CL 108 (H) 05/13/2020    CO2 20.0 05/13/2020    BUN 42 (H) 05/13/2020    CREATININE 1.91 (H) 05/13/2020    CALCIUM 8.8 05/13/2020    MG 2.3 05/13/2020    PHOS 3.5 07/19/2014       Imaging: Reviewed with the patient today   CT CAP 04/20/2020  -No evidence of intrathoracic  metastasis.  -Overall stable response in ab/pelvis      GEN O M I C     V A R I A N T S  Somatic - Potentially Actionable Variant [place] Fraction  PTPRD            p.E1458* Stop gain - LOF 50.2%  AKT2  Copy number gain  CCND3 Copy number gain  VEGFA Copy number gain  Somatic - Biologically Relevant   ZFHX3            p.Q1181* Stop gain - LOF 34.8%  TFEB              Copy number gain  Germline - Pathogenic / Likely Pathogenic  No pathogenic variants were found in the limited set of genes on which we report  MSI stable

## 2020-06-02 LAB — PRO-BNP: PRO-BNP: 501 pg/mL — ABNORMAL HIGH (ref 0.0–125.0)

## 2020-06-09 MED FILL — AFINITOR 10 MG TABLET: 28 days supply | Qty: 28 | Fill #10 | Status: AC

## 2020-06-09 MED FILL — AFINITOR 10 MG TABLET: ORAL | 28 days supply | Qty: 28 | Fill #10

## 2020-06-20 MED ORDER — FUROSEMIDE 20 MG TABLET
0 days
Start: 2020-06-20 — End: 2020-06-28

## 2020-06-21 ENCOUNTER — Other Ambulatory Visit: Payer: Self-pay | Admitting: Internal Medicine

## 2020-06-21 DIAGNOSIS — R1011 Right upper quadrant pain: Secondary | ICD-10-CM

## 2020-06-22 ENCOUNTER — Ambulatory Visit
Admission: RE | Admit: 2020-06-22 | Discharge: 2020-06-22 | Disposition: A | Discharge status: Home / Self Care | Payer: Medicare Other | Source: Ambulatory Visit | Attending: Internal Medicine | Admitting: Internal Medicine

## 2020-06-22 DIAGNOSIS — R1011 Right upper quadrant pain: Secondary | ICD-10-CM

## 2020-06-28 ENCOUNTER — Ambulatory Visit: Admit: 2020-06-28 | Discharge: 2020-06-29 | Payer: MEDICARE

## 2020-06-28 DIAGNOSIS — N1831 Chronic kidney disease, stage 3a: Principal | ICD-10-CM

## 2020-06-28 DIAGNOSIS — C641 Malignant neoplasm of right kidney, except renal pelvis: Principal | ICD-10-CM

## 2020-06-28 DIAGNOSIS — K7460 Unspecified cirrhosis of liver: Principal | ICD-10-CM

## 2020-06-28 DIAGNOSIS — I10 Essential (primary) hypertension: Principal | ICD-10-CM

## 2020-06-28 LAB — ALBUMIN / CREATININE URINE RATIO
ALBUMIN QUANT URINE: 5.4 mg/dL
ALBUMIN/CREATININE RATIO: 270 ug/mg — ABNORMAL HIGH (ref 0.0–30.0)
CREATININE, URINE: 20 mg/dL

## 2020-06-28 LAB — PROTEIN / CREATININE RATIO, URINE
CREATININE, URINE: 19 mg/dL
PROTEIN URINE: 18.4 mg/dL
PROTEIN/CREAT RATIO, URINE: 0.968

## 2020-06-28 MED ORDER — OLMESARTAN 5 MG TABLET
ORAL_TABLET | Freq: Every evening | ORAL | 3 refills | 90 days | Status: CP
Start: 2020-06-28 — End: 2021-06-28

## 2020-06-28 NOTE — Unmapped (Signed)
PCP:  RONALD D POLITE, MD   CARDIOLOGY: Donato Schultz  UROLOGY: Gean Quint  [facility]: [facility]    06/28/2020    Chief Complaint: Evaluate kidney function       HPI:  [name] is seen today in follow-up. I last saw him on 02/25/20.     He feels that his leg swelling has improved but is still present. The edema gets worse as the day progresses. He does not notice edema when he first gets up in the morning and attributes the improvement to being recumbent thought the night.    He has some darkness on his legs and is concerned about these skin changes. He applies a cream to his leg.    He had RUQ pain. He got evaluated last week. The pain is improved.     He has nocturia x 3. He drinks water thru the night.    He has occasional cramps in his legs.    He is not using any NSAIDS.    Background:    He was hospitalized twice in 2020 and had episodes of AKI.     He is a Museum/gallery exhibitions officer.    ROS:   CONSTITUTIONAL: denies fevers or chills, denies unintentional weight loss  CARDIOVASCULAR: denies chest pain, denies dyspnea on exertion, denies leg edema  GASTROINTESTINAL: denies nausea, denies vomiting, denies anorexia  GENITOURINARY: denies dysuria, denies hematuria, denies decreased urinary stream  All systems reviewed and are negative except as listed above.    PAST MEDICAL HISTORY:  Past Medical History:   Diagnosis Date   ??? Chronic kidney disease (CKD) stage G3a/A1, moderately decreased glomerular filtration rate (GFR) between 45-59 mL/min/1.73 square meter and albuminuria creatinine ratio less than 30 mg/g (CMS-HCC)    ??? Cirrhosis of liver (CMS-HCC)    ??? Hypertension    ??? Hypothyroidism    ??? Renal cancer (CMS-HCC)    ??? Thrombocytopathia (CMS-HCC)        ALLERGIES  Patient has no known allergies.    MEDICATIONS:  Current Outpatient Medications   Medication Sig Dispense Refill   ??? amLODIPine (NORVASC) 2.5 MG tablet Take 1 tablet by mouth daily.     ??? aspirin (ECOTRIN) 81 MG tablet Take 81 mg by mouth daily.     ??? everolimus, antineoplastic, (AFINITOR) 10 mg tablet Take 1 tablet (10 mg total) by mouth daily. 56 tablet 6   ??? furosemide (LASIX) 40 MG tablet Take 1 tablet by mouth daily.     ??? lactulose (CHRONULAC) 10 gram/15 mL solution 45 ml     ??? levothyroxine (SYNTHROID) 100 MCG tablet Take 1 tablet (100 mcg total) by mouth daily. 30 tablet 3   ??? omeprazole (PRILOSEC) 20 MG capsule Take 1 capsule (20 mg total) by mouth daily. 90 capsule 0   ??? spironolactone (ALDACTONE) 25 MG tablet Take 1 tablet (25 mg total) by mouth daily. 90 tablet 0   ??? thiamine HCl, vitamin B1, (VITAMIN B-1) 250 MG tablet Take 100 mg by mouth daily.      ??? triamcinolone (KENALOG) 0.1 % cream Apply topically to affected areas (arms) two (2) times a day. 80 g 0   ??? urea (CARMOL) 40 % cream Apply 1 application topically Two (2) times a day. 25 g 1     No current facility-administered medications for this visit.       PHYSICAL EXAM:  Vitals:    06/28/20 0928   BP: 148/60   Pulse: 62  Temp: 37.1 ??C (98.7 ??F)     CONSTITUTIONAL: Alert,well appearing, no distress  HEENT: Moist mucous membranes  EYES: Sclerae anicteric.  NECK: Supple  CARDIOVASCULAR: Regular, normal S1/S2 heart sounds, no murmurs, no rubs.   PULM: Clear to auscultation bilaterally  GASTROINTESTINAL: Soft, active bowel sounds, nontender  EXTREMITIES: + bilateral pitting ankle edema (right > left)  SKIN: Dark skin on legs  NEUROLOGIC: No focal motor or sensory deficits      MEDICAL DECISION MAKING    Results for orders placed or performed in visit on 06/01/20   Comprehensive Metabolic Panel   Result Value Ref Range    Sodium 138 135 - 145 mmol/L    Potassium 3.7 3.4 - 4.5 mmol/L    Chloride 104 98 - 107 mmol/L    Anion Gap 11 5 - 14 mmol/L    CO2 23.0 20.0 - 31.0 mmol/L    BUN 34 (H) 9 - 23 mg/dL    Creatinine 1.61 (H) 0.70 - 1.30 mg/dL    BUN/Creatinine Ratio 15     EGFR CKD-EPI Non-African American, Male 28 (L) >=60 mL/min/1.66m2    EGFR CKD-EPI African American, Male 33 (L) >=60 mL/min/1.59m2 Glucose 126 70 - 179 mg/dL    Calcium 9.2 8.7 - 09.6 mg/dL    Albumin 3.2 (L) 3.4 - 5.0 g/dL    Total Protein 7.2 5.7 - 8.2 g/dL    Total Bilirubin 0.2 (L) 0.3 - 1.2 mg/dL    AST 64 (H) <=04 U/L    ALT 29 10 - 49 U/L    Alkaline Phosphatase 195 (H) 46 - 116 U/L   Magnesium Level   Result Value Ref Range    Magnesium 2.2 1.6 - 2.6 mg/dL   Pro-BNP   Result Value Ref Range    PRO-BNP 501.0 (H) 0.0 - 125.0 pg/mL   T4, Free   Result Value Ref Range    Free T4 1.52 0.89 - 1.76 ng/dL   TSH   Result Value Ref Range    TSH 0.347 (L) 0.550 - 4.780 uIU/mL   CBC w/ Differential   Result Value Ref Range    WBC 4.2 (L) 4.5 - 11.0 10*9/L    RBC 3.62 (L) 4.50 - 5.90 10*12/L    HGB 9.5 (L) 13.5 - 17.5 g/dL    HCT 54.0 (L) 98.1 - 53.0 %    MCV 80.5 80.0 - 100.0 fL    MCH 26.2 26.0 - 34.0 pg    MCHC 32.5 31.0 - 37.0 g/dL    RDW 19.1 (H) 47.8 - 15.0 %    MPV 8.6 7.0 - 10.0 fL    Platelet 228 150 - 440 10*9/L    Variable HGB Concentration Slight (A) Not Present    Neutrophils % 70.2 %    Lymphocytes % 17.7 %    Monocytes % 7.6 %    Eosinophils % 2.7 %    Basophils % 0.2 %    Absolute Neutrophils 2.9 2.0 - 7.5 10*9/L    Absolute Lymphocytes 0.7 (L) 1.5 - 5.0 10*9/L    Absolute Monocytes 0.3 0.2 - 0.8 10*9/L    Absolute Eosinophils 0.1 0.0 - 0.4 10*9/L    Absolute Basophils 0.0 0.0 - 0.1 10*9/L    Large Unstained Cells 2 0 - 4 %    Microcytosis Moderate (A) Not Present    Anisocytosis Moderate (A) Not Present    Hypochromasia Marked (A) Not Present   Morphology Review   Result Value  Ref Range    Smear Review Comments See Comment (A) Undefined        No results in the last day    Invalid input(s): C02,  GLU     Lab Results   Component Value Date    CALCIUM 9.2 06/01/2020      08/06/18 Cr 1.2    02/11/19 Cr 1.25    05/06/19 Cr 1.4    07/08/19 Cr 1.7    09/02/19 Cr 2.1    10/14/19 urine Na 125, urine pr:cr 0.430, U/A neg, TSH nl, Cr 2.1, eGFR 35 ml/min, Hgb 10.8    10/14/19  CT Scan of Abd/Pelvis    10/16/19  U/A with protein, neg blood, urine alb:cr 334  Microscopy: 1-2 fine granular casts/HPF    10/30/19 Cr 1.85    12/16/19 Cr 2.2    01/27/20 Cr 2.3, K 3.6, C02 26, Hgb 10.9    03/09/20 Cr 2.3, Ferr 149    04/20/20 Cr 2.26, %sat 18    05/13/20 Cr 1.9    06/01/20 Cr 2.2, Alb 3.2, Hgb 9.5      ASSESSMENT/PLAN:      Mr.Sergio Horton is a 69 y.o. year old patient with a past medical history significant for CKD who is being seen for follow-up visit.     1. ZOX0RU0- Has single kidney. Appears to have a baseline Cr of 1.2-1.4 and now worsened with a new baseline of 2.1-2.3. Evidence of progressive worsening of kidney function in the past year. He had episodes of AKI in 2020.    Unclear if the change in renal function is related to the use of lasix. His kidney function did improve some when he was taken off the lasix. Also, he could have some progressive loss of kidney function with a solitary kidney with secondary FSGS.    He was previously was on losartan. He is off aldactone. Today, I started olmesartan 5 mg at bedtime. I also discussed with the importance of good BP control. I am not confident about his BP control. His office reading was high today and he took his Norvasc shortly before coming. He did not record his home readings. I gave him several log sheets to use at home and instructed him to call me if his systolic BP is consistently greater than 130.    Reminded him to avoid NSAIDs    At the initial visit, urine sediment with evidence of fine granular casts suggesting some component of ATN.     2. Metastatic renal cell cancer, pT3a RCC- s/p right nephrectomy 06/2014- Currently on everolimus 10 mg every day. Under the care of palliative care    3. ETOH cirrhosis- Off aldactone 25 mg per his PCP. On lasix 40 mg every day.     4. Stress-induced CM with EF now 60%- Off Aldactone 25 mg every day per his PCP. On lasix. I started olmesartan today.    5. HTN- On Norvasc 2.5 mg every day. He was taken off Aldactone (the patient reports) by Dr Nehemiah Settle a few months ago. His BP is elevated today.    I recommended that he record his BP readings at home. I gave him another log and recommended that he check 2 readings in the AM and 2 readings in the PM x 7 days before he sees his PCP in a few week.  Aim for a SBP < 130 at home.    6. Proteinuria- Has proteinuria which seems to be glomerular in origin.  Checking urine alb:cr and urine pr:cr. Starting olmesartan 5 mg at bedtime today for proteinuria reduction.    7. Edema- On lasix 40 mg every day. He does not want to use compression stockings although I think that it could be very helpful. Continue lasix 40 mg every day.     We again discussed sodium avoidance. Discussed importance of following a lower sodium diet. Instructed to read food labels and avoid foods with more than 300 mg of sodium per serving. Also recommended avoidance of processed foods, eating out and adding salt to food.     8. Anemia- Hgb 9.5. Defer to his care team at [facility]) management given his RCC.    I personally spent 40 minutes face-to-face and non-face-to-face in the care of this patient, which includes all pre, intra, and post visit time on the date of service.    Mr.Sergio Horton will follow up in 3 months to see Sergio Horton. Check BMP, PTH, phos, vit D, urine pr:cr and Hgb prior to that visit.

## 2020-06-28 NOTE — Unmapped (Signed)
1) Start olmesartan 5 mg at bedtime  2) Record your home BP readings most days of the week      Patient Education        High Blood Pressure: Care Instructions  Overview     It's normal for blood pressure to go up and down throughout the day. But if it stays up, you have high blood pressure. Another name for high blood pressure is hypertension.  Despite what a lot of people think, high blood pressure usually doesn't cause headaches or make you feel dizzy or lightheaded. It usually has no symptoms. But it does increase your risk of stroke, heart attack, and other problems. You and your doctor will talk about your risks of these problems based on your blood pressure.  Your doctor will give you a goal for your blood pressure. Your goal will be based on your health and your age.  Lifestyle changes, such as eating healthy and being active, are always important to help lower blood pressure. You might also take medicine to reach your blood pressure goal.  Follow-up care is a key part of your treatment and safety. Be sure to make and go to all appointments, and call your doctor if you are having problems. It's also a good idea to know your test results and keep a list of the medicines you take.  How can you care for yourself at home?  Medical treatment  ?? If you stop taking your medicine, your blood pressure will go back up. You may take one or more types of medicine to lower your blood pressure. Be safe with medicines. Take your medicine exactly as prescribed. Call your doctor if you think you are having a problem with your medicine.  ?? Talk to your doctor before you start taking aspirin every day. Aspirin can help certain people lower their risk of a heart attack or stroke. But taking aspirin isn't right for everyone, because it can cause serious bleeding.  ?? See your doctor regularly. You may need to see the doctor more often at first or until your blood pressure comes down.  ?? If you are taking blood pressure medicine, talk to your doctor before you take decongestants or anti-inflammatory medicine, such as ibuprofen. Some of these medicines can raise blood pressure.  ?? Learn how to check your blood pressure at home.  Lifestyle changes  ?? Stay at a healthy weight. This is especially important if you put on weight around the waist. Losing even 10 pounds can help you lower your blood pressure.  ?? If your doctor recommends it, get more exercise. Walking is a good choice. Bit by bit, increase the amount you walk every day. Try for at least 30 minutes on most days of the week. You also may want to swim, bike, or do other activities.  ?? Avoid or limit alcohol. Talk to your doctor about whether you can drink any alcohol.  ?? Try to limit how much sodium you eat to less than 2,300 milligrams (mg) a day. Your doctor may ask you to try to eat less than 1,500 mg a day.  ?? Eat plenty of fruits (such as bananas and oranges), vegetables, legumes, whole grains, and low-fat dairy products.  ?? Lower the amount of saturated fat in your diet. Saturated fat is found in animal products such as milk, cheese, and meat. Limiting these foods may help you lose weight and also lower your risk for heart disease.  ?? Do not smoke. Smoking  increases your risk for heart attack and stroke. If you need help quitting, talk to your doctor about stop-smoking programs and medicines. These can increase your chances of quitting for good.  When should you call for help?   Call 911  anytime you think you may need emergency care. This may mean having symptoms that suggest that your blood pressure is causing a serious heart or blood vessel problem. Your blood pressure may be over 180/120.  For example, call 911 if:  ?? ?? You have symptoms of a heart attack. These may include:  ? Chest pain or pressure, or a strange feeling in the chest.  ? Sweating.  ? Shortness of breath.  ? Nausea or vomiting.  ? Pain, pressure, or a strange feeling in the back, neck, jaw, or upper belly or in one or both shoulders or arms.  ? Lightheadedness or sudden weakness.  ? A fast or irregular heartbeat.   ?? ?? You have symptoms of a stroke. These may include:  ? Sudden numbness, tingling, weakness, or loss of movement in your face, arm, or leg, especially on only one side of your body.  ? Sudden vision changes.  ? Sudden trouble speaking.  ? Sudden confusion or trouble understanding simple statements.  ? Sudden problems with walking or balance.  ? A sudden, severe headache that is different from past headaches.   ?? ?? You have severe back or belly pain.   Do not wait until your blood pressure comes down on its own. Get help right away.  Call your doctor now or seek immediate care if:  ?? ?? Your blood pressure is much higher than normal (such as 180/120 or higher), but you don't have symptoms.   ?? ?? You think high blood pressure is causing symptoms, such as:  ? Severe headache.  ? Blurry vision.   Watch closely for changes in your health, and be sure to contact your doctor if:  ?? ?? Your blood pressure measures higher than your doctor recommends at least 2 times. That means the top number is higher or the bottom number is higher, or both.   ?? ?? You think you may be having side effects from your blood pressure medicine.   Where can you learn more?  Go to [facility] at https://myuncchart.org  Select Patient Education under [employer]. Enter [phone] in the search box to learn more about High Blood Pressure: Care Instructions.  Current as of: November 19, 2019??????????????????????????????Content Version: 13.0  ?? 2006-2021 [employer].   Care instructions adapted under license by [facility] If you have questions about a medical condition or this instruction, always ask your healthcare professional. [employer], Inc[employer] disclaims any warranty or liability for your use of this information.

## 2020-06-29 NOTE — Unmapped (Signed)
[facility] Refill Coordination Note    Specialty Medication(s) to be Shipped:   Hematology/Oncology: Afinitor 10mg     Other medication(s) to be shipped: No additional medications requested for fill at this time     Sergio Horton, DOB: [birth year]  Phone: [phone] (home) [phone] (work)      All above HIPAA information was verified with patient.     Was a Nurse, learning disability used for this call? No    Completed refill call assessment today to schedule patient's medication shipment from the [facility] [phone]).       Specialty medication(s) and dose(s) confirmed: Regimen is correct and unchanged.   Changes to medications: Sergio Horton reports no changes at this time.  Changes to insurance: No  Questions for the pharmacist: No    Confirmed patient received Welcome Packet with first shipment. The patient will receive a drug information handout for each medication shipped and additional FDA Medication Guides as required.       DISEASE/MEDICATION-SPECIFIC INFORMATION        N/A    SPECIALTY MEDICATION ADHERENCE     Medication Adherence    Patient reported X missed doses in the last month: 0  Specialty Medication: Afinitor 10mg   Patient is on additional specialty medications: No  Informant: patient  Adherence tools used: medication list, directed education   Other adherence tool: morning routine   Support network for adherence: family member                Afinitor 10 mg: 13 days of medicine on hand         SHIPPING     Shipping address confirmed in Epic.     Delivery Scheduled: Yes, Expected medication delivery date: 07/08/20.     Medication will be delivered via UPS to the prescription address in [employer] [place].    Sergio Horton   [facility] Specialty Technician

## 2020-07-07 MED FILL — AFINITOR 10 MG TABLET: ORAL | 28 days supply | Qty: 28 | Fill #11

## 2020-07-07 MED FILL — AFINITOR 10 MG TABLET: 28 days supply | Qty: 28 | Fill #11 | Status: AC

## 2020-07-08 ENCOUNTER — Other Ambulatory Visit: Payer: Self-pay | Admitting: Gastroenterology

## 2020-07-19 MED ORDER — CEPHALEXIN 500 MG CAPSULE
0 days
Start: 2020-07-19 — End: 2020-08-10

## 2020-07-26 DIAGNOSIS — C649 Malignant neoplasm of unspecified kidney, except renal pelvis: Principal | ICD-10-CM

## 2020-07-26 DIAGNOSIS — C651 Malignant neoplasm of right renal pelvis: Principal | ICD-10-CM

## 2020-07-27 ENCOUNTER — Ambulatory Visit: Admit: 2020-07-27 | Discharge: 2020-07-27 | Payer: MEDICARE | Attending: Family | Primary: Family

## 2020-07-27 ENCOUNTER — Ambulatory Visit: Admit: 2020-07-27 | Discharge: 2020-07-27 | Payer: MEDICARE

## 2020-07-27 DIAGNOSIS — C649 Malignant neoplasm of unspecified kidney, except renal pelvis: Principal | ICD-10-CM

## 2020-07-27 DIAGNOSIS — E039 Hypothyroidism, unspecified: Principal | ICD-10-CM

## 2020-07-27 DIAGNOSIS — D638 Anemia in other chronic diseases classified elsewhere: Principal | ICD-10-CM

## 2020-07-27 DIAGNOSIS — C651 Malignant neoplasm of right renal pelvis: Principal | ICD-10-CM

## 2020-07-27 DIAGNOSIS — C779 Secondary and unspecified malignant neoplasm of lymph node, unspecified: Principal | ICD-10-CM

## 2020-07-27 DIAGNOSIS — I5021 Acute systolic (congestive) heart failure: Principal | ICD-10-CM

## 2020-07-27 LAB — COMPREHENSIVE METABOLIC PANEL
ALBUMIN: 2.7 g/dL — ABNORMAL LOW (ref 3.4–5.0)
ALKALINE PHOSPHATASE: 213 U/L — ABNORMAL HIGH (ref 46–116)
ALT (SGPT): 30 U/L (ref 10–49)
ANION GAP: 8 mmol/L (ref 5–14)
AST (SGOT): 76 U/L — ABNORMAL HIGH (ref <=34)
BILIRUBIN TOTAL: 0.2 mg/dL — ABNORMAL LOW (ref 0.3–1.2)
BLOOD UREA NITROGEN: 37 mg/dL — ABNORMAL HIGH (ref 9–23)
BUN / CREAT RATIO: 15
CALCIUM: 8.7 mg/dL (ref 8.7–10.4)
CHLORIDE: 105 mmol/L (ref 98–107)
CO2: 18.6 mmol/L — ABNORMAL LOW (ref 20.0–31.0)
CREATININE: 2.51 mg/dL — ABNORMAL HIGH
EGFR CKD-EPI AA MALE: 29 mL/min/{1.73_m2} — ABNORMAL LOW (ref >=60)
EGFR CKD-EPI NON-AA MALE: 25 mL/min/{1.73_m2} — ABNORMAL LOW (ref >=60)
GLUCOSE RANDOM: 107 mg/dL (ref 70–179)
POTASSIUM: 4.6 mmol/L — ABNORMAL HIGH (ref 3.4–4.5)
PROTEIN TOTAL: 6.5 g/dL (ref 5.7–8.2)
SODIUM: 132 mmol/L — ABNORMAL LOW (ref 135–145)

## 2020-07-27 LAB — CBC W/ AUTO DIFF
BASOPHILS ABSOLUTE COUNT: 0.1 10*9/L (ref 0.0–0.1)
BASOPHILS RELATIVE PERCENT: 3 %
EOSINOPHILS ABSOLUTE COUNT: 0.1 10*9/L (ref 0.0–0.7)
EOSINOPHILS RELATIVE PERCENT: 2.2 %
HEMATOCRIT: 21.9 % — ABNORMAL LOW (ref 38.0–50.0)
HEMOGLOBIN: 7.3 g/dL — ABNORMAL LOW (ref 13.5–17.5)
LYMPHOCYTES ABSOLUTE COUNT: 0.7 10*9/L (ref 0.7–4.0)
LYMPHOCYTES RELATIVE PERCENT: 13.8 %
MEAN CORPUSCULAR HEMOGLOBIN CONC: 33.4 g/dL (ref 30.0–36.0)
MEAN CORPUSCULAR HEMOGLOBIN: 25.2 pg — ABNORMAL LOW (ref 26.0–34.0)
MEAN CORPUSCULAR VOLUME: 75.5 fL — ABNORMAL LOW (ref 81.0–95.0)
MEAN PLATELET VOLUME: 7 fL (ref 7.0–10.0)
MONOCYTES ABSOLUTE COUNT: 0.6 10*9/L (ref 0.1–1.0)
MONOCYTES RELATIVE PERCENT: 12.6 %
NEUTROPHILS ABSOLUTE COUNT: 3.4 10*9/L (ref 1.7–7.7)
NEUTROPHILS RELATIVE PERCENT: 68.4 %
NUCLEATED RED BLOOD CELLS: 0 /100{WBCs} (ref <=4)
PLATELET COUNT: 233 10*9/L (ref 150–450)
RED BLOOD CELL COUNT: 2.9 10*12/L — ABNORMAL LOW (ref 4.32–5.72)
RED CELL DISTRIBUTION WIDTH: 18 % — ABNORMAL HIGH (ref 12.0–15.0)
WBC ADJUSTED: 4.9 10*9/L (ref 3.5–10.5)

## 2020-07-27 LAB — SLIDE REVIEW

## 2020-07-27 LAB — MAGNESIUM: MAGNESIUM: 2.2 mg/dL (ref 1.6–2.6)

## 2020-07-27 MED ORDER — EVEROLIMUS (ANTINEOPLASTIC) 10 MG TABLET
ORAL_TABLET | Freq: Every day | ORAL | 6 refills | 56 days
Start: 2020-07-27 — End: ?

## 2020-07-27 NOTE — Unmapped (Signed)
Patient offered fluid/snack during appointment  Provided   Call bell within reach and instructed patient to use for assistance  Fall Risk assessment completed during initial assessment

## 2020-07-27 NOTE — Unmapped (Signed)
Everolimus discontinued today

## 2020-07-27 NOTE — Unmapped (Signed)
Afinitor discontinued today.  Confirmed by [name].

## 2020-07-27 NOTE — Unmapped (Addendum)
Return tomorrow for Blood Transfusion     Follow up GI as scheduled on 2/1    Will coordinate ECHO and follow up cardiology to discuss possible therapies

## 2020-07-28 ENCOUNTER — Ambulatory Visit: Admit: 2020-07-28 | Discharge: 2020-07-29 | Payer: MEDICARE

## 2020-07-28 DIAGNOSIS — C649 Malignant neoplasm of unspecified kidney, except renal pelvis: Principal | ICD-10-CM

## 2020-07-28 DIAGNOSIS — I5021 Acute systolic (congestive) heart failure: Principal | ICD-10-CM

## 2020-07-28 DIAGNOSIS — D638 Anemia in other chronic diseases classified elsewhere: Principal | ICD-10-CM

## 2020-07-28 LAB — T4, FREE: FREE T4: 1.47 ng/dL (ref 0.89–1.76)

## 2020-07-28 LAB — TSH: THYROID STIMULATING HORMONE: 1.199 u[IU]/mL (ref 0.550–4.780)

## 2020-07-28 NOTE — Unmapped (Signed)
Patient treatment visit uneventful, treatment tolerated well. PIV removed prior to exit. Patient left without questions/concerns.

## 2020-07-28 NOTE — Unmapped (Signed)
This patient has been disenrolled from the [facility] specialty pharmacy services due to everolimus discontinued.Sergio Horton  [facility] Specialty Pharmacist

## 2020-07-28 NOTE — Unmapped (Signed)
I consented the patient to receive blood and blood products such as red blood cells, fresh frozen plasma, platelets or cryoprecipitate. The consent form was signed and witnessed.     The risks and benefits for receiving blood transfusion products were explained to the patient including but not limited to infusion reactions (anaphylaxis and/or febrile), infection (bacterial, hepatitis B &C, HIV, product contamination, other infections), acute immune hemolysis reaction or delayed hemolysis, transfusion related lung injury and/or development of antibodies . The patient gives consent to receive blood transfusion products as ordered and consent will remain on file according to hospital standards.  I mentioned that the patient can decide to stop transfusions at any time. All of the patient's questions were answered to his or her satisfaction. Sergio Horton is alert and oriented, expressed understanding, and consented to proceed with the proposed treatment plan.     Sergio Horton, AGNP-BC  [facility]

## 2020-08-04 MED ORDER — TRIAMCINOLONE ACETONIDE 0.1 % TOPICAL CREAM
0 refills | 0 days | Status: CP
Start: 2020-08-04 — End: ?
  Filled 2020-08-09: qty 80, 20d supply, fill #0

## 2020-08-10 ENCOUNTER — Ambulatory Visit: Admit: 2020-08-10 | Discharge: 2020-08-10 | Payer: MEDICARE | Attending: Family | Primary: Family

## 2020-08-10 ENCOUNTER — Other Ambulatory Visit: Admit: 2020-08-10 | Discharge: 2020-08-10 | Payer: MEDICARE

## 2020-08-10 ENCOUNTER — Ambulatory Visit: Admit: 2020-08-10 | Discharge: 2020-08-10 | Payer: MEDICARE

## 2020-08-10 DIAGNOSIS — C779 Secondary and unspecified malignant neoplasm of lymph node, unspecified: Principal | ICD-10-CM

## 2020-08-10 DIAGNOSIS — F32A Depression, unspecified: Principal | ICD-10-CM

## 2020-08-10 DIAGNOSIS — K7030 Alcoholic cirrhosis of liver without ascites: Principal | ICD-10-CM

## 2020-08-10 DIAGNOSIS — E039 Hypothyroidism, unspecified: Principal | ICD-10-CM

## 2020-08-10 DIAGNOSIS — R601 Generalized edema: Principal | ICD-10-CM

## 2020-08-10 DIAGNOSIS — I10 Essential (primary) hypertension: Principal | ICD-10-CM

## 2020-08-10 DIAGNOSIS — I959 Hypotension, unspecified: Principal | ICD-10-CM

## 2020-08-10 DIAGNOSIS — C649 Malignant neoplasm of unspecified kidney, except renal pelvis: Principal | ICD-10-CM

## 2020-08-10 DIAGNOSIS — D638 Anemia in other chronic diseases classified elsewhere: Principal | ICD-10-CM

## 2020-08-10 DIAGNOSIS — Z905 Acquired absence of kidney: Principal | ICD-10-CM

## 2020-08-10 MED ORDER — FUROSEMIDE 20 MG TABLET
ORAL_TABLET | Freq: Every day | ORAL | 11 refills | 30 days | Status: CP
Start: 2020-08-10 — End: 2021-08-10

## 2020-08-17 ENCOUNTER — Other Ambulatory Visit: Payer: Self-pay | Admitting: Gastroenterology

## 2020-08-19 ENCOUNTER — Other Ambulatory Visit (HOSPITAL_COMMUNITY)
Admission: RE | Admit: 2020-08-19 | Discharge: 2020-08-19 | Disposition: A | Discharge status: Home / Self Care | Payer: Medicare Other | Source: Ambulatory Visit | Attending: Gastroenterology | Admitting: Gastroenterology

## 2020-08-19 DIAGNOSIS — Z20822 Contact with and (suspected) exposure to covid-19: Secondary | ICD-10-CM | POA: Insufficient documentation

## 2020-08-19 DIAGNOSIS — Z01812 Encounter for preprocedural laboratory examination: Secondary | ICD-10-CM | POA: Insufficient documentation

## 2020-08-19 DIAGNOSIS — N170 Acute kidney failure with tubular necrosis: Secondary | ICD-10-CM | POA: Diagnosis not present

## 2020-08-19 DIAGNOSIS — R944 Abnormal results of kidney function studies: Secondary | ICD-10-CM | POA: Diagnosis not present

## 2020-08-19 LAB — SARS CORONAVIRUS 2 (TAT 6-24 HRS): SARS Coronavirus 2: NEGATIVE

## 2020-08-22 ENCOUNTER — Other Ambulatory Visit: Payer: Self-pay

## 2020-08-22 ENCOUNTER — Inpatient Hospital Stay (HOSPITAL_COMMUNITY)
Admission: EM | Admit: 2020-08-22 | Discharge: 2020-09-20 | DRG: 682 | Disposition: E | Payer: Medicare Other | Attending: Internal Medicine | Admitting: Internal Medicine

## 2020-08-22 ENCOUNTER — Encounter (HOSPITAL_COMMUNITY): Payer: Self-pay | Admitting: Obstetrics and Gynecology

## 2020-08-22 ENCOUNTER — Emergency Department (HOSPITAL_COMMUNITY): Payer: Medicare Other

## 2020-08-22 DIAGNOSIS — D631 Anemia in chronic kidney disease: Secondary | ICD-10-CM | POA: Diagnosis present

## 2020-08-22 DIAGNOSIS — N183 Chronic kidney disease, stage 3 unspecified: Secondary | ICD-10-CM | POA: Diagnosis not present

## 2020-08-22 DIAGNOSIS — K7030 Alcoholic cirrhosis of liver without ascites: Secondary | ICD-10-CM | POA: Diagnosis not present

## 2020-08-22 DIAGNOSIS — Z7189 Other specified counseling: Secondary | ICD-10-CM | POA: Diagnosis not present

## 2020-08-22 DIAGNOSIS — N1832 Chronic kidney disease, stage 3b: Secondary | ICD-10-CM | POA: Diagnosis present

## 2020-08-22 DIAGNOSIS — N170 Acute kidney failure with tubular necrosis: Secondary | ICD-10-CM | POA: Diagnosis present

## 2020-08-22 DIAGNOSIS — Z66 Do not resuscitate: Secondary | ICD-10-CM | POA: Diagnosis not present

## 2020-08-22 DIAGNOSIS — Z515 Encounter for palliative care: Secondary | ICD-10-CM | POA: Diagnosis not present

## 2020-08-22 DIAGNOSIS — J449 Chronic obstructive pulmonary disease, unspecified: Secondary | ICD-10-CM | POA: Diagnosis present

## 2020-08-22 DIAGNOSIS — Z9079 Acquired absence of other genital organ(s): Secondary | ICD-10-CM

## 2020-08-22 DIAGNOSIS — Z20822 Contact with and (suspected) exposure to covid-19: Secondary | ICD-10-CM | POA: Diagnosis present

## 2020-08-22 DIAGNOSIS — Z79899 Other long term (current) drug therapy: Secondary | ICD-10-CM

## 2020-08-22 DIAGNOSIS — C641 Malignant neoplasm of right kidney, except renal pelvis: Secondary | ICD-10-CM | POA: Diagnosis present

## 2020-08-22 DIAGNOSIS — R944 Abnormal results of kidney function studies: Secondary | ICD-10-CM | POA: Diagnosis present

## 2020-08-22 DIAGNOSIS — C779 Secondary and unspecified malignant neoplasm of lymph node, unspecified: Secondary | ICD-10-CM | POA: Diagnosis present

## 2020-08-22 DIAGNOSIS — N179 Acute kidney failure, unspecified: Principal | ICD-10-CM

## 2020-08-22 DIAGNOSIS — Z6820 Body mass index (BMI) 20.0-20.9, adult: Secondary | ICD-10-CM | POA: Diagnosis not present

## 2020-08-22 DIAGNOSIS — I499 Cardiac arrhythmia, unspecified: Secondary | ICD-10-CM

## 2020-08-22 DIAGNOSIS — E875 Hyperkalemia: Secondary | ICD-10-CM | POA: Diagnosis present

## 2020-08-22 DIAGNOSIS — G9349 Other encephalopathy: Secondary | ICD-10-CM | POA: Diagnosis not present

## 2020-08-22 DIAGNOSIS — G9341 Metabolic encephalopathy: Secondary | ICD-10-CM | POA: Diagnosis present

## 2020-08-22 DIAGNOSIS — R54 Age-related physical debility: Secondary | ICD-10-CM | POA: Diagnosis present

## 2020-08-22 DIAGNOSIS — E440 Moderate protein-calorie malnutrition: Secondary | ICD-10-CM | POA: Diagnosis present

## 2020-08-22 DIAGNOSIS — I10 Essential (primary) hypertension: Secondary | ICD-10-CM | POA: Diagnosis present

## 2020-08-22 DIAGNOSIS — N19 Unspecified kidney failure: Secondary | ICD-10-CM | POA: Diagnosis not present

## 2020-08-22 DIAGNOSIS — C799 Secondary malignant neoplasm of unspecified site: Secondary | ICD-10-CM

## 2020-08-22 DIAGNOSIS — E46 Unspecified protein-calorie malnutrition: Secondary | ICD-10-CM

## 2020-08-22 DIAGNOSIS — E039 Hypothyroidism, unspecified: Secondary | ICD-10-CM | POA: Diagnosis present

## 2020-08-22 DIAGNOSIS — Z7982 Long term (current) use of aspirin: Secondary | ICD-10-CM | POA: Diagnosis not present

## 2020-08-22 DIAGNOSIS — I129 Hypertensive chronic kidney disease with stage 1 through stage 4 chronic kidney disease, or unspecified chronic kidney disease: Secondary | ICD-10-CM | POA: Diagnosis present

## 2020-08-22 DIAGNOSIS — E871 Hypo-osmolality and hyponatremia: Secondary | ICD-10-CM | POA: Diagnosis present

## 2020-08-22 DIAGNOSIS — Z905 Acquired absence of kidney: Secondary | ICD-10-CM

## 2020-08-22 DIAGNOSIS — Z79890 Hormone replacement therapy (postmenopausal): Secondary | ICD-10-CM | POA: Diagnosis not present

## 2020-08-22 DIAGNOSIS — F1020 Alcohol dependence, uncomplicated: Secondary | ICD-10-CM | POA: Diagnosis present

## 2020-08-22 DIAGNOSIS — D649 Anemia, unspecified: Secondary | ICD-10-CM

## 2020-08-22 DIAGNOSIS — C649 Malignant neoplasm of unspecified kidney, except renal pelvis: Secondary | ICD-10-CM | POA: Diagnosis not present

## 2020-08-22 DIAGNOSIS — Z8679 Personal history of other diseases of the circulatory system: Secondary | ICD-10-CM

## 2020-08-22 DIAGNOSIS — R188 Other ascites: Secondary | ICD-10-CM

## 2020-08-22 DIAGNOSIS — K7031 Alcoholic cirrhosis of liver with ascites: Secondary | ICD-10-CM | POA: Diagnosis present

## 2020-08-22 DIAGNOSIS — R52 Pain, unspecified: Secondary | ICD-10-CM | POA: Diagnosis not present

## 2020-08-22 DIAGNOSIS — I2510 Atherosclerotic heart disease of native coronary artery without angina pectoris: Secondary | ICD-10-CM | POA: Diagnosis present

## 2020-08-22 DIAGNOSIS — R531 Weakness: Secondary | ICD-10-CM | POA: Diagnosis not present

## 2020-08-22 LAB — COMPREHENSIVE METABOLIC PANEL
ALT: 22 U/L (ref 0–44)
AST: 73 U/L — ABNORMAL HIGH (ref 15–41)
Albumin: 2.6 g/dL — ABNORMAL LOW (ref 3.5–5.0)
Alkaline Phosphatase: 150 U/L — ABNORMAL HIGH (ref 38–126)
Anion gap: 15 (ref 5–15)
BUN: 105 mg/dL — ABNORMAL HIGH (ref 8–23)
CO2: 15 mmol/L — ABNORMAL LOW (ref 22–32)
Calcium: 8.7 mg/dL — ABNORMAL LOW (ref 8.9–10.3)
Chloride: 100 mmol/L (ref 98–111)
Creatinine, Ser: 5.09 mg/dL — ABNORMAL HIGH (ref 0.61–1.24)
GFR, Estimated: 11 mL/min — ABNORMAL LOW (ref >60)
Glucose, Bld: 106 mg/dL — ABNORMAL HIGH (ref 70–99)
Potassium: 6.5 mmol/L (ref 3.5–5.1)
Sodium: 130 mmol/L — ABNORMAL LOW (ref 135–145)
Total Bilirubin: 0.7 mg/dL (ref 0.3–1.2)
Total Protein: 6.9 g/dL (ref 6.5–8.1)

## 2020-08-22 LAB — CBC
HCT: 23.2 % — ABNORMAL LOW (ref 39.0–52.0)
Hemoglobin: 7.4 g/dL — ABNORMAL LOW (ref 13.0–17.0)
MCH: 27.4 pg (ref 26.0–34.0)
MCHC: 31.9 g/dL (ref 30.0–36.0)
MCV: 85.9 fL (ref 80.0–100.0)
Platelets: 436 10*3/uL — ABNORMAL HIGH (ref 150–400)
RBC: 2.7 MIL/uL — ABNORMAL LOW (ref 4.22–5.81)
RDW: 19.2 % — ABNORMAL HIGH (ref 11.5–15.5)
WBC: 12.9 10*3/uL — ABNORMAL HIGH (ref 4.0–10.5)
nRBC: 0 % (ref 0.0–0.2)

## 2020-08-22 LAB — BASIC METABOLIC PANEL
Anion gap: 15 (ref 5–15)
BUN: 92 mg/dL — ABNORMAL HIGH (ref 8–23)
CO2: 14 mmol/L — ABNORMAL LOW (ref 22–32)
Calcium: 8.6 mg/dL — ABNORMAL LOW (ref 8.9–10.3)
Chloride: 101 mmol/L (ref 98–111)
Creatinine, Ser: 5.02 mg/dL — ABNORMAL HIGH (ref 0.61–1.24)
GFR, Estimated: 12 mL/min — ABNORMAL LOW (ref >60)
Glucose, Bld: 113 mg/dL — ABNORMAL HIGH (ref 70–99)
Potassium: 6.1 mmol/L — ABNORMAL HIGH (ref 3.5–5.1)
Sodium: 130 mmol/L — ABNORMAL LOW (ref 135–145)

## 2020-08-22 LAB — CBG MONITORING, ED: Glucose-Capillary: 104 mg/dL — ABNORMAL HIGH (ref 70–99)

## 2020-08-22 MED ORDER — SODIUM BICARBONATE 8.4 % IV SOLN: Freq: Once | INTRAVENOUS | Status: DC

## 2020-08-22 MED ORDER — TRIAMCINOLONE ACETONIDE 0.1 % EX CREA
1.0000 "application " | TOPICAL_CREAM | Freq: Every day | CUTANEOUS | Status: DC | PRN
  Filled 2020-08-22: qty 15

## 2020-08-22 MED ORDER — SORBITOL 70 % SOLN
30.0000 mL | Status: DC | PRN
  Filled 2020-08-22: qty 30

## 2020-08-22 MED ORDER — HEPARIN SODIUM (PORCINE) 5000 UNIT/ML IJ SOLN
5000.0000 [IU] | Freq: Three times a day (TID) | INTRAMUSCULAR | Status: DC
  Administered 2020-08-23 – 2020-08-28 (×17): 5000 [IU] via SUBCUTANEOUS
  Filled 2020-08-22 (×18): qty 1

## 2020-08-22 MED ORDER — INSULIN ASPART 100 UNIT/ML IV SOLN
5.0000 [IU] | Freq: Once | INTRAVENOUS | Status: AC
  Administered 2020-08-22: 5 [IU] via INTRAVENOUS
  Filled 2020-08-22: qty 0.05

## 2020-08-22 MED ORDER — PANTOPRAZOLE SODIUM 40 MG PO TBEC
40.0000 mg | DELAYED_RELEASE_TABLET | Freq: Every day | ORAL | Status: DC
  Administered 2020-08-23 – 2020-08-27 (×5): 40 mg via ORAL
  Filled 2020-08-22 (×5): qty 1

## 2020-08-22 MED ORDER — SODIUM ZIRCONIUM CYCLOSILICATE 10 G PO PACK
10.0000 g | PACK | Freq: Once | ORAL | Status: AC
  Administered 2020-08-22: 10 g via ORAL
  Filled 2020-08-22: qty 1

## 2020-08-22 MED ORDER — NEPRO/CARBSTEADY PO LIQD
237.0000 mL | Freq: Three times a day (TID) | ORAL | Status: DC | PRN
  Filled 2020-08-22: qty 237

## 2020-08-22 MED ORDER — ACETAMINOPHEN 325 MG PO TABS: 650.0000 mg | ORAL_TABLET | Freq: Four times a day (QID) | ORAL | Status: DC | PRN

## 2020-08-22 MED ORDER — STERILE WATER FOR INJECTION IV SOLN
INTRAVENOUS | Status: DC
  Filled 2020-08-22: qty 850

## 2020-08-22 MED ORDER — DOCUSATE SODIUM 283 MG RE ENEM
1.0000 | ENEMA | RECTAL | Status: DC | PRN
  Filled 2020-08-22: qty 1

## 2020-08-22 MED ORDER — DEXTROSE 50 % IV SOLN
1.0000 | Freq: Once | INTRAVENOUS | Status: AC
  Administered 2020-08-22: 50 mL via INTRAVENOUS
  Filled 2020-08-22: qty 50

## 2020-08-22 MED ORDER — HYDROXYZINE HCL 25 MG PO TABS
25.0000 mg | ORAL_TABLET | Freq: Three times a day (TID) | ORAL | Status: DC | PRN
Start: 2020-08-22 — End: 2020-08-29

## 2020-08-22 MED ORDER — SODIUM BICARBONATE 8.4 % IV SOLN
Freq: Once | INTRAVENOUS | Status: AC
  Filled 2020-08-22: qty 150

## 2020-08-22 MED ORDER — AMLODIPINE BESYLATE 5 MG PO TABS
2.5000 mg | ORAL_TABLET | Freq: Every day | ORAL | Status: DC
  Administered 2020-08-23 – 2020-08-24 (×2): 2.5 mg via ORAL
  Filled 2020-08-22 (×2): qty 1

## 2020-08-22 MED ORDER — LEVOTHYROXINE SODIUM 100 MCG PO TABS
100.0000 ug | ORAL_TABLET | Freq: Every day | ORAL | Status: DC
  Administered 2020-08-23 – 2020-08-28 (×6): 100 ug via ORAL
  Filled 2020-08-22 (×6): qty 1

## 2020-08-22 MED ORDER — ASPIRIN 81 MG PO CHEW
81.0000 mg | CHEWABLE_TABLET | Freq: Every day | ORAL | Status: DC
  Administered 2020-08-23 – 2020-08-27 (×5): 81 mg via ORAL
  Filled 2020-08-22 (×5): qty 1

## 2020-08-22 MED ORDER — THIAMINE HCL 100 MG PO TABS
100.0000 mg | ORAL_TABLET | Freq: Every day | ORAL | Status: DC
  Administered 2020-08-23 – 2020-08-27 (×5): 100 mg via ORAL
  Filled 2020-08-22 (×7): qty 1

## 2020-08-22 MED ORDER — ONDANSETRON HCL 4 MG/2ML IJ SOLN
4.0000 mg | Freq: Four times a day (QID) | INTRAMUSCULAR | Status: DC | PRN
  Administered 2020-08-25: 4 mg via INTRAVENOUS
  Filled 2020-08-22: qty 2

## 2020-08-22 MED ORDER — CAMPHOR-MENTHOL 0.5-0.5 % EX LOTN
1.0000 "application " | TOPICAL_LOTION | Freq: Three times a day (TID) | CUTANEOUS | Status: DC | PRN
  Filled 2020-08-22: qty 222

## 2020-08-22 MED ORDER — CALCIUM CARBONATE ANTACID 1250 MG/5ML PO SUSP
500.0000 mg | Freq: Four times a day (QID) | ORAL | Status: DC | PRN
  Filled 2020-08-22: qty 5

## 2020-08-22 MED ORDER — ZOLPIDEM TARTRATE 5 MG PO TABS
5.0000 mg | ORAL_TABLET | Freq: Every evening | ORAL | Status: DC | PRN
  Administered 2020-08-23 – 2020-08-26 (×4): 5 mg via ORAL
  Filled 2020-08-22 (×4): qty 1

## 2020-08-22 MED ORDER — ACETAMINOPHEN 650 MG RE SUPP: 650.0000 mg | Freq: Four times a day (QID) | RECTAL | Status: DC | PRN

## 2020-08-22 MED ORDER — ONDANSETRON HCL 4 MG PO TABS: 4.0000 mg | ORAL_TABLET | Freq: Four times a day (QID) | ORAL | Status: DC | PRN

## 2020-08-22 NOTE — ED Notes (Signed)
CBG 104 

## 2020-08-22 NOTE — ED Triage Notes (Signed)
Patient reports to the ER from home via [facility]. Patient was called by PCP to inform him that his kidney levels are elevated. Patient has a hx of kidney cancer and has had one kidney removed. Patient also reports abdominal pain where he has a hernia and has been having SOB. Rales noted by EMS in lowe lung bases.

## 2020-08-22 NOTE — ED Provider Notes (Signed)
[facility]-EMERGENCY DEPT Provider Note   CSN: [id number] Arrival date & time: 07/24/2020  1655     History Chief Complaint  Patient presents with  . Chronic Kidney Disease    Stephen Grimes is a 70 y.o. male.  HPI Patient presents for evaluation today because his creatinine was elevated at 4.6 on 08/16/2020.  PCP contacted him today to inform him to come to the ED because of that.  He is scheduled to have a colonoscopy and upper endoscopy done tomorrow.  He has kidney cancer, status post right nephrectomy.  He last saw his cancer doctor at [facility], on 08/10/2020.  Last chemotherapy was 07/27/2020.  At the last visit patient learned that the chemotherapy was not working, so no treatment needs to be initiated but cannot proceed until he has had a comprehensive work-up including cardiac, and GI.  Cardiac echo has been, and he is thereby cleared by cardiology to proceed with axitinib.  GI work-up still pending.  He takes diuretics.  He has anemia of chronic disease.  Last blood cell transfusion was in December 2021.    Past Medical History:  Diagnosis Date  . Alcoholic cirrhosis (HCC)   . Cancer [facility])    kidney cancer  . Cirrhosis (HCC)   . COPD (chronic obstructive pulmonary disease) (HCC)   . Coronary artery disease   . HTN (hypertension)   . Hypertension   . Metastatic renal cell carcinoma (HCC)   . Renal cancer (HCC)   . Renal disorder   . Renal impairment   . Thrombocytopathia (HCC)   . Thrombocytopenia (HCC) 12/11/2011  . Thyroid disease     Patient Active Problem List   Diagnosis Date Noted  . Hepatic encephalopathy (HCC) 11/23/2018  . Palliative care by specialist   . Goals of care, counseling/discussion   . Encephalopathy   . AKI (acute kidney injury) (HCC)   . Acute congestive heart failure (HCC)   . Metastatic renal cell carcinoma (HCC)   . Acute respiratory failure (HCC) 11/16/2018  . HCAP (healthcare-associated pneumonia) 09/12/2018  .  Hyponatremia 09/12/2018  . Acute metabolic encephalopathy 09/12/2018  . Sepsis (HCC) 09/12/2018  . Cirrhosis of liver (HCC) 06/10/2018  . Encounter for long-term (current) use of high-risk medication 06/10/2018  . Thrombocytopathia (HCC) 06/10/2018  . Metastatic renal cell carcinoma to lymph node (HCC) 08/31/2016  . Alcoholic cirrhosis of liver without ascites (HCC) 03/19/2016  . ED (erectile dysfunction) 03/19/2016  . Malignant neoplasm of unspecified kidney, except renal pelvis (HCC) 03/19/2016  . AKI (acute kidney injury) (HCC) 07/02/2014  . Essential (primary) hypertension 07/02/2014  . Hypothyroidism, unspecified 07/02/2014  . Thrombocytopenia (HCC) 12/11/2011    Past Surgical History:  Procedure Laterality Date  . ESOPHAGOGASTRODUODENOSCOPY (EGD) WITH PROPOFOL N/A 04/28/2019   Procedure: ESOPHAGOGASTRODUODENOSCOPY (EGD) WITH PROPOFOL;  Surgeon: Charlott Rakes, MD;  Location: [facility] ENDOSCOPY;  Service: Endoscopy;  Laterality: N/A;  . kidney removed Right   . TESTICLE REMOVAL  1972       No family history on file.  Social History   Tobacco Use  . Smoking status: Never Smoker  . Smokeless tobacco: Never Used  Vaping Use  . Vaping Use: Never used  Substance Use Topics  . Alcohol use: No  . Drug use: Not Currently    Home Medications Prior to Admission medications   Medication Sig Start Date End Date Taking? Authorizing Provider  amLODipine (NORVASC) 2.5 MG tablet Take 2.5 mg by mouth daily.   Yes [provider]  aspirin 81 MG chewable tablet Chew 1 tablet (81 mg total) by mouth daily with breakfast. 11/23/18  Yes Emokpae, Courage, MD  levothyroxine (SYNTHROID) 100 MCG tablet Take 1 tablet (100 mcg total) by mouth daily before breakfast. 11/23/18  Yes Emokpae, Courage, MD  olmesartan (BENICAR) 5 MG tablet Take 5 mg by mouth daily.   Yes [provider]  omeprazole (PRILOSEC) 20 MG capsule Take 20 mg by mouth daily. 02/25/19  Yes [provider]   thiamine (VITAMIN B-1) 100 MG tablet Take 100 mg by mouth daily.   Yes [provider]  triamcinolone (KENALOG) 0.1 % Apply 1 application topically daily as needed (itching). 08/05/20  Yes [provider]  furosemide (LASIX) 20 MG tablet Take 20 mg by mouth daily. 12/05/18   [provider]    Allergies    Patient has no known allergies.  Review of Systems   Review of Systems  All other systems reviewed and are negative.   Physical Exam Updated Vital Signs BP 124/65   Pulse 94   Temp 98.2 F (36.8 C) (Oral)   Resp (!) 29   Ht _0  (1.702 m)   Wt 59.9 kg   SpO2 96%   BMI 20.67 kg/m   Physical Exam Vitals and nursing note reviewed.  Constitutional:      General: He is not in acute distress.    Appearance: He is well-developed and well-nourished. He is not ill-appearing, toxic-appearing or diaphoretic.     Comments: Elderly, frail  HENT:     Head: Normocephalic and atraumatic.     Right Ear: External ear normal.     Left Ear: External ear normal.     Mouth/Throat:     Mouth: Mucous membranes are moist.  Eyes:     Extraocular Movements: EOM normal.     Conjunctiva/sclera: Conjunctivae normal.     Pupils: Pupils are equal, round, and reactive to light.  Neck:     Trachea: Phonation normal.  Cardiovascular:     Rate and Rhythm: Normal rate and regular rhythm.     Heart sounds: Normal heart sounds.  Pulmonary:     Effort: Pulmonary effort is normal.     Breath sounds: Normal breath sounds.  Chest:     Chest wall: No bony tenderness.  Abdominal:     General: There is distension.     Palpations: Abdomen is soft.     Tenderness: There is no abdominal tenderness.     Comments: Reducible midline hernia, beneath midline scar.  Musculoskeletal:        General: Normal range of motion.     Cervical back: Normal range of motion and neck supple.  Skin:    General: Skin is warm, dry and intact.  Neurological:     Mental Status: He is alert and  oriented to person, place, and time.     Cranial Nerves: No cranial nerve deficit.     Sensory: No sensory deficit.     Motor: No abnormal muscle tone.     Coordination: Coordination normal.  Psychiatric:        Mood and Affect: Mood and affect and mood normal.        Behavior: Behavior normal.        Thought Content: Thought content normal.        Judgment: Judgment normal.     ED Results / Procedures / Treatments   Labs (all labs ordered are listed, but only abnormal results  are displayed) Labs Reviewed  COMPREHENSIVE METABOLIC PANEL - Abnormal; Notable for the following components:      Result Value   Sodium 130 (*)    Potassium 6.5 (*)    CO2 15 (*)    Glucose, Bld 106 (*)    BUN 105 (*)    Creatinine, Ser 5.09 (*)    Calcium 8.7 (*)    Albumin 2.6 (*)    AST 73 (*)    Alkaline Phosphatase 150 (*)    GFR, Estimated 11 (*)    All other components within normal limits  CBC - Abnormal; Notable for the following components:   WBC 12.9 (*)    RBC 2.70 (*)    Hemoglobin 7.4 (*)    HCT 23.2 (*)    RDW 19.2 (*)    Platelets 436 (*)    All other components within normal limits  BASIC METABOLIC PANEL  POTASSIUM  POTASSIUM  POTASSIUM    EKG None   Date: 08/17/2020  Rate: 106  Rhythm: sinus tachycardia  QRS Axis: normal  PR and QT Intervals: normal  ST/T Wave abnormalities: normal  PR and QRS Conduction Disutrbances:none  Narrative Interpretation:   Old EKG Reviewed: changes noted-right faster, otherwise no significant change     Radiology US Renal  Result Date: 08/10/2020 CLINICAL DATA:  Acute renal failure, history of right nephrectomy for renal malignancy, history of hypertension EXAM: RENAL / URINARY TRACT ULTRASOUND COMPLETE COMPARISON:  Ultrasound 06/22/2020, CT 01/12/2019 FINDINGS: Right Kidney: Surgically absent. No gross abnormality in the right nephrectomy bed aside from the presence of upper abdominal ascites. Left Kidney: Renal measurements: 11.2 x  5.2 x 5.9 cm = volume: 180.6 mL. Echogenicity is within normal limits. No concerning renal mass, shadowing calculus or hydronephrosis. Bladder: Some mild bladder wall thickening is nonspecific, could correlate for features of cystitis. Patient noted a were unable to void despite the prevoid volume of 84 mL. Other: Moderate volume ascites throughout the abdomen. Cirrhotic morphology of the imaged portions of the liver tip. IMPRESSION: 1. Status post right nephrectomy. No gross abnormality in the right nephrectomy bed. 2. Unremarkable sonographic appearance of the left kidney. 3. Mild bladder wall thickening is nonspecific, correlate for features of cystitis. 4. Patient noted in inability to void despite a prevoid volume of 84 mL. Correlate for clinical features of urinary retention. 5. Moderate volume ascites throughout the abdomen. Cirrhotic appearance of the included portions of the liver. Electronically Signed   By: Lovena Le M.D.   On: 08/09/2020 20:39    Procedures .Critical Care Performed by: Daleen Bo, MD Authorized by: Daleen Bo, MD   Critical care provider statement:    Critical care time (minutes):  50   Critical care start time:  08/19/2020 5:29 PM   Critical care end time:  08/17/2020 9:31 PM   Critical care time was exclusive of:  Separately billable procedures and treating other patients   Critical care was time spent personally by me on the following activities:  Blood draw for specimens, development of treatment plan with patient or surrogate, discussions with consultants, evaluation of patient's response to treatment, examination of patient, obtaining history from patient or surrogate, ordering and performing treatments and interventions, ordering and review of laboratory studies, pulse oximetry, re-evaluation of patient's condition, review of old charts and ordering and review of radiographic studies     Medications Ordered in ED Medications  sodium zirconium  cyclosilicate (LOKELMA) packet 10 g (has no administration in time range)  insulin aspart (novoLOG) injection 5 Units (has no administration in time range)    And  dextrose 50 % solution 50 mL (has no administration in time range)  sodium bicarbonate 150 mEq in dextrose 5 % 1,000 mL infusion (has no administration in time range)    ED Course  I have reviewed the triage vital signs and the nursing notes.  Pertinent labs & imaging results that were available during my care of the patient were reviewed by me and considered in my medical decision making (see chart for details).    MDM Rules/Calculators/A&P                           Patient Vitals for the past 24 hrs:  BP Temp Temp src Pulse Resp SpO2 Height Weight  08/02/2020 2000 124/65 -- -- 94 (!) 29 96 % -- --  08/09/2020 1930 111/84 -- -- 97 (!) 26 98 % -- --  08/02/2020 1900 109/61 -- -- 88 (!) 23 99 % -- --  08/14/2020 1830 (!) 122/55 -- -- (!) 101 (!) 25 99 % -- --  08/11/2020 1800 (!) 125/57 -- -- 99 (!) 22 98 % -- --  07/29/2020 1730 (!) 127/59 -- -- (!) 103 (!) 26 98 % -- --  08/21/2020 1723 129/63 98.2 F (36.8 C) Oral (!) 114 (!) 30 96 % -- --  08/10/2020 1716 -- -- -- -- -- -- _0  (1.702 m) 59.9 kg  07/31/2020 1715 -- -- -- -- -- 96 % -- --    8:54 PM Reevaluation with update and discussion. After initial assessment and treatment, an updated evaluation reveals no change in clinical status.  Patient updated on findings and plan.Daleen Bo   Medical Decision Making:  This patient is presenting for evaluation of abnormal laboratory tests, cardiac, which does require a range of treatment options, and is a complaint that involves a high risk of morbidity and mortality. The differential diagnoses include acute kidney failure, bladder outlet obstruction, any toxicity/cancer. I decided to review old records, and in summary elderly male being followed by oncology for metastatic cancer presenting for incidental worsening creatinine from  2.6-4.6..  I did not require additional historical information from anyone.  Clinical Laboratory Tests Ordered, included CBC and Metabolic panel. Review indicates white count high, hemoglobin low, platelets high, sodium low, potassium high, CO2 low, glucose high, BUN high, creatinine high, calcium low, albumin low, AST high, alk phos stays high, GFR low. Radiologic Tests Ordered, included renal/bladder ultrasound.  I independently Visualized: Radiographic images, which show absent right kidney, normal left kidney, thickened urinary bladder is nonspecific, abdominal ascites  Cardiac Monitor Tracing which shows sinus tachycardia, normal QTC   Critical Interventions-clinical evaluation, laboratory testing, EKG, renal and bladder ultrasounds, observation reassessment  After These Interventions, the Patient was reevaluated and was found elevated creatinine from baseline, nonspecific, most likely related to diuresis with Lasix.  Patient has abdominal ascites, currently taking furosemide but not spironolactone.  No evidence for cardiac arrhythmia.  Patient is due to have a screening EGD and colonoscopy tomorrow.  He cannot proceed with that because of his metabolic instability.  Requires hospitalization, likely stepdown for management.  Treated with multiple measures to lower potassium.  He remained hemodynamic stable.  CRITICAL CARE- yes Performed by: Daleen Bo  Nursing Notes Reviewed/ Care Coordinated Applicable Imaging Reviewed Interpretation of Laboratory Data incorporated into ED treatment   8:59 PM-Consult complete with hospitalist. Patient case explained  and discussed.  He agrees to admit patient for further evaluation and treatment. Call ended at 9:31 PM    Final Clinical Impression(s) / ED Diagnoses Final diagnoses:  AKI (acute kidney injury) [facility])  Metastatic malignant neoplasm, unspecified site [facility])  Other ascites    Rx / DC Orders ED Discharge Orders    None        Daleen Bo, MD 08/19/2020 2131

## 2020-08-22 NOTE — H&P (Signed)
History and Physical   Stephen Grimes WNU:[id number] DOB: Sep 28, 1950 DOA: 07/23/2020  Referring MD/NP/PA: Dr. Eulis Foster  PCP: Seward Carol, MD   Outpatient Specialists: At [facility]  Patient coming from: Home  Chief Complaint: Abnormal lab  HPI: Stephen Grimes is a 70 y.o. male with medical history significant of right renal cell carcinoma status post nephrectomy 6 years ago, liver cirrhosis, coronary artery disease, essential hypertension, previous history of of alcoholism, hypothyroidism and COPD who has been doing well after his nephrectomy and chemotherapy for his renal cell carcinoma until the last couple of weeks when he was told that the chemo is no longer working.  Patient was noted to have had chronic kidney disease afterwards.  He was being worked up with prechemo evaluation.  He had echocardiogram also other tests and is scheduled to have endoscopy tomorrow by Dr. Michail Sermon all in preparation for possible new chemo.  As part of his work-up he was seen by his PCP Dr. Delfina Redwood but 2 days ago where he had blood work done.  His creatinine was noted to be elevated to above 4.  He was called this afternoon and asked to come to the emergency room.  He had a creatinine has gone above 5.  Patient has been on ARB and diuretics all this time.  He is also noted some decline in oral intake.  He felt more weak and tired.  No vomiting or diarrhea.  Patient being admitted to the hospital with acute on chronic kidney disease most likely as a result of decreased oral intake, diuretics and ARB.  No obstructive uropathy seen on ultrasound..  ED Course: Temperature 98.2 blood pressure 107/59 pulse 140 respirate of 31 oxygen sat 96% room air.  His white count is 12.9 hemoglobin 7.4 and platelets 436.  Sodium 130 potassium 6.1 chloride is 101 CO2 14 BUN 92 creatinine 5.02 and calcium 8.6.  Glucose 113.  Renal ultrasound showed no acute findings.  COVID-19 screen is negative.  Patient will be admitted for  treatment of acute kidney injury probably prerenal  Review of Systems: As per HPI otherwise 10 point review of systems negative.    Past Medical History:  Diagnosis Date  . Alcoholic cirrhosis ([place])   . Cancer [facility])    kidney cancer  . Cirrhosis ([place])   . COPD (chronic obstructive pulmonary disease) ([place])   . Coronary artery disease   . HTN (hypertension)   . Hypertension   . Metastatic renal cell carcinoma ([place])   . Renal cancer ([place])   . Renal disorder   . Renal impairment   . Thrombocytopathia ([place])   . Thrombocytopenia ([place]) 12/11/2011  . Thyroid disease     Past Surgical History:  Procedure Laterality Date  . ESOPHAGOGASTRODUODENOSCOPY (EGD) WITH PROPOFOL N/A 04/28/2019   Procedure: ESOPHAGOGASTRODUODENOSCOPY (EGD) WITH PROPOFOL;  Surgeon: Wilford Corner, MD;  Location: [facility] ENDOSCOPY;  Service: Endoscopy;  Laterality: N/A;  . kidney removed Right   . TESTICLE REMOVAL  1972     reports that he has never smoked. He has never used smokeless tobacco. He reports previous drug use. He reports that he does not drink alcohol.  No Known Allergies  No family history on file.   Prior to Admission medications   Medication Sig Start Date End Date Taking? Authorizing Provider  amLODipine (NORVASC) 2.5 MG tablet Take 2.5 mg by mouth daily.   Yes [provider]  aspirin 81 MG chewable tablet Chew 1 tablet (81 mg total) by  mouth daily with breakfast. 11/23/18  Yes Emokpae, Courage, MD  levothyroxine (SYNTHROID) 100 MCG tablet Take 1 tablet (100 mcg total) by mouth daily before breakfast. 11/23/18  Yes Emokpae, Courage, MD  olmesartan (BENICAR) 5 MG tablet Take 5 mg by mouth daily.   Yes [provider]  omeprazole (PRILOSEC) 20 MG capsule Take 20 mg by mouth daily. 02/25/19  Yes [provider]  thiamine (VITAMIN B-1) 100 MG tablet Take 100 mg by mouth daily.   Yes [provider]  triamcinolone (KENALOG) 0.1 % Apply 1 application topically daily as  needed (itching). 08/05/20  Yes [provider]  furosemide (LASIX) 20 MG tablet Take 20 mg by mouth daily. 12/05/18   [provider]    Physical Exam: Vitals:   08/21/2020 1930 07/31/2020 2000 08/11/2020 2100 08/18/2020 2130  BP: 111/84 124/65 122/67 (!) 121/58  Pulse: 97 94 97 92  Resp: (!) 26 (!) 29 (!) 25 (!) 22  Temp:      TempSrc:      SpO2: 98% 96% 99% 100%  Weight:      Height:          Constitutional: Chronically ill looking and cachectic, no distress Vitals:   07/24/2020 1930 07/28/2020 2000 07/26/2020 2100 07/26/2020 2130  BP: 111/84 124/65 122/67 (!) 121/58  Pulse: 97 94 97 92  Resp: (!) 26 (!) 29 (!) 25 (!) 22  Temp:      TempSrc:      SpO2: 98% 96% 99% 100%  Weight:      Height:       Eyes: PERRL, lids and conjunctivae normal ENMT: Mucous membranes are moist. Posterior pharynx clear of any exudate or lesions.Normal dentition.  Neck: normal, supple, no masses, no thyromegaly Respiratory: Coarse breath sound bilaterally clear to auscultation bilaterally, no wheezing, mild bilateral basal crackles. Normal respiratory effort. No accessory muscle use.  Cardiovascular: Sinus tachycardia, no murmurs / rubs / gallops.  1+ extremity edema. 2+ pedal pulses. No carotid bruits.  Abdomen: no tenderness, no masses palpated. No hepatosplenomegaly. Bowel sounds positive.  Positive ascites Musculoskeletal: no clubbing / cyanosis. No joint deformity upper and lower extremities. Good ROM, no contractures. Normal muscle tone.  Skin: no rashes, lesions, ulcers. No induration Neurologic: CN 2-12 grossly intact. Sensation intact, DTR normal. Strength 5/5 in all 4.  Psychiatric: Normal judgment and insight. Alert and oriented x 3. Normal mood.     Labs on Admission: I have personally reviewed following labs and imaging studies  CBC: Recent Labs  Lab 07/26/2020 1733  WBC 12.9*  HGB 7.4*  HCT 23.2*  MCV 85.9  PLT 956*   Basic Metabolic Panel: Recent Labs  Lab  08/01/2020 1733 08/09/2020 2053  NA 130* 130*  K 6.5* 6.1*  CL 100 101  CO2 15* 14*  GLUCOSE 106* 113*  BUN 105* 92*  CREATININE 5.09* 5.02*  CALCIUM 8.7* 8.6*   GFR: Estimated Creatinine Clearance: 11.4 mL/min (A) (by C-G formula based on SCr of 5.02 mg/dL (H)). Liver Function Tests: Recent Labs  Lab 08/06/2020 1733  AST 73*  ALT 22  ALKPHOS 150*  BILITOT 0.7  PROT 6.9  ALBUMIN 2.6*   No results for input(s): LIPASE, AMYLASE in the last 168 hours. No results for input(s): AMMONIA in the last 168 hours. Coagulation Profile: No results for input(s): INR, PROTIME in the last 168 hours. Cardiac Enzymes: No results for input(s): CKTOTAL, CKMB, CKMBINDEX, TROPONINI in the last 168 hours. BNP (last 3 results) No  results for input(s): PROBNP in the last 8760 hours. HbA1C: No results for input(s): HGBA1C in the last 72 hours. CBG: Recent Labs  Lab 08/12/2020 2050  GLUCAP 104*   Lipid Profile: No results for input(s): CHOL, HDL, LDLCALC, TRIG, CHOLHDL, LDLDIRECT in the last 72 hours. Thyroid Function Tests: No results for input(s): TSH, T4TOTAL, FREET4, T3FREE, THYROIDAB in the last 72 hours. Anemia Panel: No results for input(s): VITAMINB12, FOLATE, FERRITIN, TIBC, IRON, RETICCTPCT in the last 72 hours. Urine analysis:    Component Value Date/Time   COLORURINE YELLOW 11/16/2018 1629   APPEARANCEUR CLEAR 11/16/2018 1629   LABSPEC 1.006 11/16/2018 1629   PHURINE 5.0 11/16/2018 1629   GLUCOSEU NEGATIVE 11/16/2018 1629   HGBUR SMALL (A) 11/16/2018 1629   BILIRUBINUR NEGATIVE 11/16/2018 1629   KETONESUR NEGATIVE 11/16/2018 1629   PROTEINUR 30 (A) 11/16/2018 1629   NITRITE NEGATIVE 11/16/2018 1629   LEUKOCYTESUR NEGATIVE 11/16/2018 1629   Sepsis Labs: @LABRCNTIP (procalcitonin:4,lacticidven:4) ) Recent Results (from the past 240 hour(s))  SARS CORONAVIRUS 2 (TAT 6-24 HRS) Nasopharyngeal Nasopharyngeal Swab     Status: None   Collection Time: 08/19/20 12:44 PM   Specimen:  Nasopharyngeal Swab  Result Value Ref Range Status   SARS Coronavirus 2 NEGATIVE NEGATIVE Final    Comment: (NOTE) SARS-CoV-2 target nucleic acids are NOT DETECTED.  The SARS-CoV-2 RNA is generally detectable in upper and lower respiratory specimens during the acute phase of infection. Negative results do not preclude SARS-CoV-2 infection, do not rule out co-infections with other pathogens, and should not be used as the sole basis for treatment or other patient management decisions. Negative results must be combined with clinical observations, patient history, and epidemiological information. The expected result is Negative.  Fact Sheet for Patients: SugarRoll.be  Fact Sheet for Healthcare Providers: https://www.woods-mathews.com/  This test is not yet approved or cleared by the [place] FDA and  has been authorized for detection and/or diagnosis of SARS-CoV-2 by FDA under an Emergency Use Authorization (EUA). This EUA will remain  in effect (meaning this test can be used) for the duration of the COVID-19 declaration under Se ction 564(b)(1) of the Act, 21 U.S.C. section 360bbb-3(b)(1), unless the authorization is terminated or revoked sooner.  Performed at [facility], [place] [address] [place], [place] [address]      Radiological Exams on Admission: US Renal  Result Date: 08/12/2020 CLINICAL DATA:  Acute renal failure, history of right nephrectomy for renal malignancy, history of hypertension EXAM: RENAL / URINARY TRACT ULTRASOUND COMPLETE COMPARISON:  Ultrasound 06/22/2020, CT 01/12/2019 FINDINGS: Right Kidney: Surgically absent. No gross abnormality in the right nephrectomy bed aside from the presence of upper abdominal ascites. Left Kidney: Renal measurements: 11.2 x 5.2 x 5.9 cm = volume: 180.6 mL. Echogenicity is within normal limits. No concerning renal mass, shadowing calculus or hydronephrosis. Bladder: Some mild bladder  wall thickening is nonspecific, could correlate for features of cystitis. Patient noted a were unable to void despite the prevoid volume of 84 mL. Other: Moderate volume ascites throughout the abdomen. Cirrhotic morphology of the imaged portions of the liver tip. IMPRESSION: 1. Status post right nephrectomy. No gross abnormality in the right nephrectomy bed. 2. Unremarkable sonographic appearance of the left kidney. 3. Mild bladder wall thickening is nonspecific, correlate for features of cystitis. 4. Patient noted in inability to void despite a prevoid volume of 84 mL. Correlate for clinical features of urinary retention. 5. Moderate volume ascites throughout the abdomen. Cirrhotic appearance of the included portions  of the liver. Electronically Signed   By: Lovena Le M.D.   On: 07/25/2020 20:39    EKG: Independently reviewed.  It shows sinus tachycardia with no significant ST changes  Assessment/Plan Principal Problem:   Acute renal failure superimposed on stage 3 chronic kidney disease, unspecified acute renal failure type, unspecified whether stage 3a or 3b CKD (HCC) Active Problems:   Alcoholic cirrhosis of liver without ascites ([place])   Essential (primary) hypertension   Hypothyroidism, unspecified   Malignant neoplasm of unspecified kidney, except renal pelvis (HCC)   Hyponatremia   Hyperkalemia     #1  Acute kidney failure: Patient's last creatinine in our system was 1.29 with GFR of more than 60 back in May 2020.  His current GFR of 11 with the use of diuretics as well as ARB indicates acute kidney injury.  No obstructive symptoms and ultrasound did not show any hydronephrosis.  We will admit the patient and treat for AKI.  Aggressive hydration and close follow-up with renal function.  Monitor closely.  If no improvement nephrology will be consulted.  #2  Renal cell carcinoma: Being managed by oncologist at [facility].  Scheduled to have endoscopy tomorrow which would be on hold.   Treat the patient back to baseline and if better will follow up with oncology in [facility].  #3 hyperkalemia: Initial treatment in the ER.  Patient will be on bicarb drip.  Kayexalate as needed.  If potassium continues to rise nephrology may be consulted for possible emergent hemodialysis.  #4 liver cirrhosis: Continue holding diuretics at this point.  He does have significant ascites.  #5 anemia of chronic disease: Hemoglobin noted to be 7.4.  Continue monitor.  No indication for it transfusion at this point.  #6 protein calorie malnutrition: Albumin of 2.6.  Partly due to cirrhosis.  Monitor closely.  #7 essential hypertension: Continue with Norvasc on hold diuretics.  #8 hyponatremia: Most likely due to cirrhosis and dehydration.  Monitor closely.     DVT prophylaxis: SCD Code Status: Full code Family Communication: No family at bedside Disposition Plan: Home Consults called: None but nephrology may be consulted in the morning if needed Admission status: Inpatient  Severity of Illness: The appropriate patient status for this patient is INPATIENT. Inpatient status is judged to be reasonable and necessary in order to provide the required intensity of service to ensure the patient's safety. The patient's presenting symptoms, physical exam findings, and initial radiographic and laboratory data in the context of their chronic comorbidities is felt to place them at high risk for further clinical deterioration. Furthermore, it is not anticipated that the patient will be medically stable for discharge from the hospital within 2 midnights of admission. The following factors support the patient status of inpatient.   " The patient's presenting symptoms include weakness and abnormal labs. " The worrisome physical exam findings include cachexia. " The initial radiographic and laboratory data are worrisome because of creatinine of 5.09,. " The chronic co-morbidities include renal cell  carcinoma.   * I certify that at the point of admission it is my clinical judgment that the patient will require inpatient hospital care spanning beyond 2 midnights from the point of admission due to high intensity of service, high risk for further deterioration and high frequency of surveillance required.Barbette Merino MD [facility] Pager [phone]  If 7PM-7AM, please contact night-coverage [email or web address] Password [email or web address]  08/18/2020, 10:45 PM

## 2020-08-22 NOTE — ED Notes (Signed)
400 mL found on bladder scan

## 2020-08-22 NOTE — ED Notes (Signed)
Ultrasound at bedside

## 2020-08-22 NOTE — ED Notes (Signed)
CRITICAL VALUE ALERT  Critical Value:  Potassium 6.5  Date & Time Notied: 08/08/2020 1856  Provider Notified: Eulis Foster, EDP  Orders Received/Actions taken: acknowledged

## 2020-08-23 ENCOUNTER — Encounter (HOSPITAL_COMMUNITY): Payer: Self-pay | Admitting: Internal Medicine

## 2020-08-23 ENCOUNTER — Encounter (HOSPITAL_COMMUNITY): Payer: Self-pay | Admitting: Certified Registered"

## 2020-08-23 ENCOUNTER — Encounter (HOSPITAL_COMMUNITY): Admission: EM | Disposition: E | Payer: Self-pay | Source: Home / Self Care | Attending: Internal Medicine

## 2020-08-23 ENCOUNTER — Other Ambulatory Visit: Payer: Self-pay

## 2020-08-23 ENCOUNTER — Ambulatory Visit (HOSPITAL_COMMUNITY): Admission: RE | Admit: 2020-08-23 | Payer: Medicare Other | Source: Home / Self Care | Admitting: Gastroenterology

## 2020-08-23 DIAGNOSIS — K7030 Alcoholic cirrhosis of liver without ascites: Secondary | ICD-10-CM

## 2020-08-23 DIAGNOSIS — E875 Hyperkalemia: Secondary | ICD-10-CM | POA: Diagnosis not present

## 2020-08-23 DIAGNOSIS — I10 Essential (primary) hypertension: Secondary | ICD-10-CM | POA: Diagnosis not present

## 2020-08-23 DIAGNOSIS — N179 Acute kidney failure, unspecified: Secondary | ICD-10-CM | POA: Diagnosis not present

## 2020-08-23 LAB — POTASSIUM
Potassium: 4.9 mmol/L (ref 3.5–5.1)
Potassium: 5 mmol/L (ref 3.5–5.1)
Potassium: 5.3 mmol/L — ABNORMAL HIGH (ref 3.5–5.1)
Potassium: 5.6 mmol/L — ABNORMAL HIGH (ref 3.5–5.1)

## 2020-08-23 LAB — COMPREHENSIVE METABOLIC PANEL
ALT: 24 U/L (ref 0–44)
AST: 57 U/L — ABNORMAL HIGH (ref 15–41)
Albumin: 2.3 g/dL — ABNORMAL LOW (ref 3.5–5.0)
Alkaline Phosphatase: 137 U/L — ABNORMAL HIGH (ref 38–126)
Anion gap: 14 (ref 5–15)
BUN: 84 mg/dL — ABNORMAL HIGH (ref 8–23)
CO2: 20 mmol/L — ABNORMAL LOW (ref 22–32)
Calcium: 8.3 mg/dL — ABNORMAL LOW (ref 8.9–10.3)
Chloride: 97 mmol/L — ABNORMAL LOW (ref 98–111)
Creatinine, Ser: 4.01 mg/dL — ABNORMAL HIGH (ref 0.61–1.24)
GFR, Estimated: 15 mL/min — ABNORMAL LOW (ref >60)
Glucose, Bld: 141 mg/dL — ABNORMAL HIGH (ref 70–99)
Potassium: 5.3 mmol/L — ABNORMAL HIGH (ref 3.5–5.1)
Sodium: 131 mmol/L — ABNORMAL LOW (ref 135–145)
Total Bilirubin: 0.7 mg/dL (ref 0.3–1.2)
Total Protein: 6.1 g/dL — ABNORMAL LOW (ref 6.5–8.1)

## 2020-08-23 LAB — PREPARE RBC (CROSSMATCH)

## 2020-08-23 LAB — CBC
HCT: 19.6 % — ABNORMAL LOW (ref 39.0–52.0)
Hemoglobin: 6.5 g/dL — CL (ref 13.0–17.0)
MCH: 27.7 pg (ref 26.0–34.0)
MCHC: 33.2 g/dL (ref 30.0–36.0)
MCV: 83.4 fL (ref 80.0–100.0)
Platelets: 354 10*3/uL (ref 150–400)
RBC: 2.35 MIL/uL — ABNORMAL LOW (ref 4.22–5.81)
RDW: 19.1 % — ABNORMAL HIGH (ref 11.5–15.5)
WBC: 11.2 10*3/uL — ABNORMAL HIGH (ref 4.0–10.5)
nRBC: 0 % (ref 0.0–0.2)

## 2020-08-23 LAB — CREATININE, SERUM
Creatinine, Ser: 4.77 mg/dL — ABNORMAL HIGH (ref 0.61–1.24)
GFR, Estimated: 12 mL/min — ABNORMAL LOW (ref >60)

## 2020-08-23 LAB — ABO/RH: ABO/RH(D): A POS

## 2020-08-23 SURGERY — CANCELLED PROCEDURE

## 2020-08-23 MED ORDER — SODIUM CHLORIDE 0.9 % IV SOLN: INTRAVENOUS | Status: DC

## 2020-08-23 MED ORDER — SODIUM ZIRCONIUM CYCLOSILICATE 5 G PO PACK
5.0000 g | PACK | Freq: Two times a day (BID) | ORAL | Status: DC
  Administered 2020-08-23: 5 g via ORAL
  Filled 2020-08-23: qty 1

## 2020-08-23 MED ORDER — SODIUM CHLORIDE 0.9% IV SOLUTION: Freq: Once | INTRAVENOUS | Status: AC

## 2020-08-23 MED ORDER — SODIUM ZIRCONIUM CYCLOSILICATE 10 G PO PACK
10.0000 g | PACK | Freq: Two times a day (BID) | ORAL | Status: DC
  Administered 2020-08-23 – 2020-08-24 (×3): 10 g via ORAL
  Filled 2020-08-23 (×3): qty 1

## 2020-08-23 SURGICAL SUPPLY — 24 items

## 2020-08-23 NOTE — ED Notes (Signed)
Logan RN at bedside to attempt [place] IV

## 2020-08-23 NOTE — Progress Notes (Signed)
PROGRESS NOTE    Stephen Grimes  ERX:[id number] DOB: [birth year] DOA: 08/18/2020 PCP: Seward Carol, MD    Chief Complaint  Patient presents with  . Chronic Kidney Disease    Brief Narrative:  Stephen Grimes is a 70 y.o. male with medical history significant of right renal cell carcinoma status post nephrectomy 6 years ago, liver cirrhosis, coronary artery disease, essential hypertension, previous history of of alcoholism, hypothyroidism and COPD who has been doing well after his nephrectomy and chemotherapy for his renal cell carcinoma , was told that he was failing chemotherapy, was being evaluated for EGD and echocardiogram in preparation for new chemo, and his labs revealed creatinine of 4. He was sent to ED for further evaluation.  On arrival to ED he was found to have a creatinine of 5 and hyperkalemic with a potassium of 6.5 He was referred to [facility] for admission .  Nephrology consulted for further recommendations.  Ultrasound of the renal did not show any obstructive pathology.  Assessment & Plan:   Principal Problem:   Acute renal failure superimposed on stage 3 chronic kidney disease, unspecified acute renal failure type, unspecified whether stage 3a or 3b CKD (HCC) Active Problems:   Alcoholic cirrhosis of liver without ascites (HCC)   Essential (primary) hypertension   Hypothyroidism, unspecified   Malignant neoplasm of unspecified kidney, except renal pelvis (HCC)   Hyponatremia   Hyperkalemia    Acute on stage IV CKD with metabolic acidosis and hyperkalemia Probably secondary to poor oral intake and use of ARB and diuretics. Slight improvement of creatinine from 5-4 after IV fluids. Continue with gentle hydration.  Avoid hypotension. Ultrasound of the left kidney did not show any hydronephrosis or obstructive pathology. Nephrology consulted for further evaluation. Bicarbonate drip stopped after bicarb improved to 20.    Hyperkalemia Resolved with  [place].  Malnutrition probably secondary to liver cirrhosis and metastatic renal cell carcinoma.   Renal cell carcinoma Recommend outpatient follow-up with oncology. Palliative care consulted in view of poor oral intake, worsening renal parameters, clinical deterioration.   Alcoholic liver cirrhosis with moderate ascites Patient currently denies any nausea vomiting. No signs of encephalopathy at this time.   History of Takotsubo cardiomyopathy, dilated cardiomyopathy. Cardiology recommends holding of Plavix and to continue with aspirin. Repeat echocardiogram normalized left ventricular ejection fraction.   COPD No wheezing heard   Hypothyroidism Continue with Synthroid.   Hyponatremia probably secondary to dehydration.    Anemia of chronic disease Probably secondary to renal cell carcinoma.  Hemoglobin 6.5 this morning. No evidence of obvious bleeding. 1 unit of PRBC transfusion ordered. Repeat cbc in am.    Hypertension Blood pressure parameters are optimal.  DVT prophylaxis: Heparin.  Code Status: Full code.  Family Communication: none at bedside.  Disposition:   Status is: Inpatient  Remains inpatient appropriate because:Ongoing diagnostic testing needed not appropriate for outpatient work up, Unsafe d/c plan, IV treatments appropriate due to intensity of illness or inability to take PO and Inpatient level of care appropriate due to severity of illness   Dispo: The patient is from: Home              Anticipated d/c is to: pending.              Anticipated d/c date is: > 3 days              Patient currently is not medically stable to d/c.   Difficult to place patient No   Level  of care: Telemetry     Consultants:   Nephrology.    Procedures: US RENAL.    Antimicrobials: none.    Subjective: No new complaints.   Objective: Vitals:   09/03/2020 1100 09/01/2020 1403 09/01/2020 1404 09/19/2020 1419  BP: 121/68 (!) 118/58 (!) 123/58 119/64   Pulse: 89 87 90 88  Resp: 19 20 19  (!) 21  Temp:  (!) 97.4 F (36.3 C)  (!) 97.4 F (36.3 C)  TempSrc:  Oral Oral Oral  SpO2: 99% 98%  98%  Weight:      Height:        Intake/Output Summary (Last 24 hours) at 09/16/2020 1532 Last data filed at 08/30/2020 1404 Gross per 24 hour  Intake 310 ml  Output 280 ml  Net 30 ml   Filed Weights   08/07/2020 1716  Weight: 59.9 kg    Examination:  General exam: Appears calm and comfortable  Respiratory system: Clear to auscultation. Respiratory effort normal. Cardiovascular system: S1 & S2 heard, RRR.  Gastrointestinal system: Abdomen is soft, distended, non tender.   Normal bowel sounds heard. Central nervous system: Alert and oriented. No focal neurological deficits. Extremities: Symmetric 5 x 5 power. Skin: No rashes seen.  Psychiatry: Mood & affect appropriate.     Data Reviewed: I have personally reviewed following labs and imaging studies  CBC: Recent Labs  Lab 08/01/2020 1733 08/30/2020 0505  WBC 12.9* 11.2*  HGB 7.4* 6.5*  HCT 23.2* 19.6*  MCV 85.9 83.4  PLT 436* 160    Basic Metabolic Panel: Recent Labs  Lab 07/31/2020 1733 07/23/2020 2053 08/03/2020 2338 09/11/2020 0338 09/15/2020 0505 09/02/2020 0619 09/12/2020 1155  NA 130* 130*  --   --  131*  --   --   K 6.5* 6.1* 5.6* 5.3* 5.3* 4.9 5.0  CL 100 101  --   --  97*  --   --   CO2 15* 14*  --   --  20*  --   --   GLUCOSE 106* 113*  --   --  141*  --   --   BUN 105* 92*  --   --  84*  --   --   CREATININE 5.09* 5.02* 4.77*  --  4.01*  --   --   CALCIUM 8.7* 8.6*  --   --  8.3*  --   --     GFR: Estimated Creatinine Clearance: 14.3 mL/min (A) (by C-G formula based on SCr of 4.01 mg/dL (H)).  Liver Function Tests: Recent Labs  Lab 07/24/2020 1733 09/02/2020 0505  AST 73* 57*  ALT 22 24  ALKPHOS 150* 137*  BILITOT 0.7 0.7  PROT 6.9 6.1*  ALBUMIN 2.6* 2.3*    CBG: Recent Labs  Lab 08/02/2020 2050  GLUCAP 104*     Recent Results (from the past 240 hour(s))   SARS CORONAVIRUS 2 (TAT 6-24 HRS) Nasopharyngeal Nasopharyngeal Swab     Status: None   Collection Time: 08/19/20 12:44 PM   Specimen: Nasopharyngeal Swab  Result Value Ref Range Status   SARS Coronavirus 2 NEGATIVE NEGATIVE Final    Comment: (NOTE) SARS-CoV-2 target nucleic acids are NOT DETECTED.  The SARS-CoV-2 RNA is generally detectable in upper and lower respiratory specimens during the acute phase of infection. Negative results do not preclude SARS-CoV-2 infection, do not rule out co-infections with other pathogens, and should not be used as the sole basis for treatment or other patient management decisions. Negative results must be  combined with clinical observations, patient history, and epidemiological information. The expected result is Negative.  Fact Sheet for Patients: SugarRoll.be  Fact Sheet for Healthcare Providers: https://www.woods-mathews.com/  This test is not yet approved or cleared by the [place] FDA and  has been authorized for detection and/or diagnosis of SARS-CoV-2 by FDA under an Emergency Use Authorization (EUA). This EUA will remain  in effect (meaning this test can be used) for the duration of the COVID-19 declaration under Se ction 564(b)(1) of the Act, 21 U.S.C. section 360bbb-3(b)(1), unless the authorization is terminated or revoked sooner.  Performed at [facility], [place] [address], [place], [place] [address]          Radiology Studies: US Renal  Result Date: 08/13/2020 CLINICAL DATA:  Acute renal failure, history of right nephrectomy for renal malignancy, history of hypertension EXAM: RENAL / URINARY TRACT ULTRASOUND COMPLETE COMPARISON:  Ultrasound 06/22/2020, CT 01/12/2019 FINDINGS: Right Kidney: Surgically absent. No gross abnormality in the right nephrectomy bed aside from the presence of upper abdominal ascites. Left Kidney: Renal measurements: 11.2 x 5.2 x 5.9 cm = volume: 180.6  mL. Echogenicity is within normal limits. No concerning renal mass, shadowing calculus or hydronephrosis. Bladder: Some mild bladder wall thickening is nonspecific, could correlate for features of cystitis. Patient noted a were unable to void despite the prevoid volume of 84 mL. Other: Moderate volume ascites throughout the abdomen. Cirrhotic morphology of the imaged portions of the liver tip. IMPRESSION: 1. Status post right nephrectomy. No gross abnormality in the right nephrectomy bed. 2. Unremarkable sonographic appearance of the left kidney. 3. Mild bladder wall thickening is nonspecific, correlate for features of cystitis. 4. Patient noted in inability to void despite a prevoid volume of 84 mL. Correlate for clinical features of urinary retention. 5. Moderate volume ascites throughout the abdomen. Cirrhotic appearance of the included portions of the liver. Electronically Signed   By: Lovena Le M.D.   On: 08/08/2020 20:39        Scheduled Meds: . amLODipine  2.5 mg Oral Daily  . aspirin  81 mg Oral Q breakfast  . heparin  5,000 Units Subcutaneous Q8H  . levothyroxine  100 mcg Oral QAC breakfast  . pantoprazole  40 mg Oral Daily  . sodium zirconium cyclosilicate  10 g Oral BID  . thiamine  100 mg Oral Daily   Continuous Infusions: . sodium chloride       LOS: 1 day        Hosie Poisson, MD [facility]   To contact the attending provider between 7A-7P or the covering provider during after hours 7P-7A, please log into the web site [email or web address] and access using universal [place] password for that web site. If you do not have the password, please call the hospital operator.  09/01/2020, 3:32 PM

## 2020-08-23 NOTE — ED Notes (Addendum)
RN assisted pt to standing- pt able to use urinal, steady, no complications- pt urinated 227ml

## 2020-08-23 NOTE — Consult Note (Signed)
Reason for Consult: AKI/CKD stage IV Referring Physician: Karleen Hampshire, MD  Stephen Grimes is an 70 y.o. male with a PMH significant for metastatic renal cell carcinoma (s/p right nephrectomy and failing chemo), cirrhosis, CAD, HTN, COPD, and CKD stage IV (baseline Scr 2-2.5) who was being evaluated with ECHO and EGD in preparation for new chemo (axitinib), when his labs revealed a Scr of 4.  He was contacted by his PCP and directed him to  [facility] ED.  In the ED he was tachycardic at 140, Hgb 7.4, K 6.1, BUN 92, Cr 5.02, and covid negative.  He had reported decreased po intake over the past week or so associated with fatigue and weakness.  He denied any N/V/D but did have poor po intake.  Renal US was negative for obstruction.  He was admitted for further evaluation and we were consulted to help evaluate and manage his AKI/CKD stage IV.  Of note, he had been taking an ARB and diuretics during this time of decreased po intake.  He is normally followed by Dr. Smith Mince of [facility] and was aware of his progressive CKD stage IV prior to admission.  The trend in Scr is seen below.  He was started on olmesartan 5 mg on 06/28/20.  Trend in Creatinine: Creatinine, Ser  Date/Time Value Ref Range Status  09/13/2020 05:05 AM 4.01 (H) 0.61 - 1.24 mg/dL Final  07/30/2020 11:38 PM 4.77 (H) 0.61 - 1.24 mg/dL Final  07/28/2020 08:53 PM 5.02 (H) 0.61 - 1.24 mg/dL Final  08/12/2020 05:33 PM 5.09 (H) 0.61 - 1.24 mg/dL Final  08/10/2020 2.54    07/27/2020 2.51    06/01/2020 2.24    05/13/2020 1.91    11/22/2018 05:18 AM 1.29 (H) 0.61 - 1.24 mg/dL Final  11/21/2018 02:38 AM 1.29 (H) 0.61 - 1.24 mg/dL Final  11/20/2018 03:14 AM 1.38 (H) 0.61 - 1.24 mg/dL Final  11/19/2018 03:11 AM 1.72 (H) 0.61 - 1.24 mg/dL Final  11/18/2018 03:27 AM 2.28 (H) 0.61 - 1.24 mg/dL Final  11/17/2018 01:14 AM 3.00 (H) 0.61 - 1.24 mg/dL Final  11/16/2018 06:43 PM 2.98 (H) 0.61 - 1.24 mg/dL Final  11/16/2018 12:46 PM 3.80 (H) 0.61 - 1.24 mg/dL  Final  11/16/2018 12:41 PM 3.46 (H) 0.61 - 1.24 mg/dL Final  09/18/2018 05:46 AM 1.14 0.61 - 1.24 mg/dL Final  09/17/2018 06:28 AM 1.13 0.61 - 1.24 mg/dL Final  09/16/2018 06:35 AM 1.23 0.61 - 1.24 mg/dL Final  09/15/2018 03:48 AM 1.48 (H) 0.61 - 1.24 mg/dL Final  09/14/2018 03:46 AM 1.60 (H) 0.61 - 1.24 mg/dL Final  09/14/2018 12:33 AM 1.69 (H) 0.61 - 1.24 mg/dL Final  09/13/2018 08:35 PM 1.89 (H) 0.61 - 1.24 mg/dL Final  09/13/2018 04:21 PM 1.91 (H) 0.61 - 1.24 mg/dL Final  09/13/2018 11:54 AM 2.06 (H) 0.61 - 1.24 mg/dL Final  09/13/2018 08:05 AM 2.13 (H) 0.61 - 1.24 mg/dL Final  09/13/2018 02:58 AM 2.27 (H) 0.61 - 1.24 mg/dL Final  09/12/2018 08:00 PM 2.70 (H) 0.61 - 1.24 mg/dL Final  09/12/2018 03:00 PM 3.36 (H) 0.61 - 1.24 mg/dL Final  07/20/2014 03:43 PM 2.29 (H) 0.50 - 1.35 mg/dL Final  12/11/2011 03:31 PM 1.28 0.50 - 1.35 mg/dL Final  05/18/2010 10:33 AM 1.12 0.40 - 1.50 mg/dL Final    PMH:   Past Medical History:  Diagnosis Date  . Alcoholic cirrhosis ([place])   . Cancer [facility])    kidney cancer  . Cirrhosis ([place])   . COPD (chronic obstructive  pulmonary disease) ([place])   . Coronary artery disease   . HTN (hypertension)   . Hypertension   . Metastatic renal cell carcinoma ([place])   . Renal cancer ([place])   . Renal disorder   . Renal impairment   . Thrombocytopathia ([place])   . Thrombocytopenia ([place]) 12/11/2011  . Thyroid disease     PSH:   Past Surgical History:  Procedure Laterality Date  . ESOPHAGOGASTRODUODENOSCOPY (EGD) WITH PROPOFOL N/A 04/28/2019   Procedure: ESOPHAGOGASTRODUODENOSCOPY (EGD) WITH PROPOFOL;  Surgeon: Wilford Corner, MD;  Location: [facility] ENDOSCOPY;  Service: Endoscopy;  Laterality: N/A;  . kidney removed Right   . TESTICLE REMOVAL  1972    Allergies: No Known Allergies  Medications:   Prior to Admission medications   Medication Sig Start Date End Date Taking? Authorizing Provider  amLODipine (NORVASC) 2.5 MG tablet Take 2.5 mg by mouth daily.   Yes  [provider]  aspirin 81 MG chewable tablet Chew 1 tablet (81 mg total) by mouth daily with breakfast. 11/23/18  Yes Emokpae, Courage, MD  levothyroxine (SYNTHROID) 100 MCG tablet Take 1 tablet (100 mcg total) by mouth daily before breakfast. 11/23/18  Yes Emokpae, Courage, MD  olmesartan (BENICAR) 5 MG tablet Take 5 mg by mouth daily.   Yes [provider]  omeprazole (PRILOSEC) 20 MG capsule Take 20 mg by mouth daily. 02/25/19  Yes [provider]  thiamine (VITAMIN B-1) 100 MG tablet Take 100 mg by mouth daily.   Yes [provider]  triamcinolone (KENALOG) 0.1 % Apply 1 application topically daily as needed (itching). 08/05/20  Yes [provider]  furosemide (LASIX) 20 MG tablet Take 20 mg by mouth daily. 12/05/18   [provider]    Inpatient medications: . sodium chloride   Intravenous Once  . amLODipine  2.5 mg Oral Daily  . aspirin  81 mg Oral Q breakfast  . heparin  5,000 Units Subcutaneous Q8H  . levothyroxine  100 mcg Oral QAC breakfast  . pantoprazole  40 mg Oral Daily  . sodium zirconium cyclosilicate  5 g Oral BID  . thiamine  100 mg Oral Daily    Discontinued Meds:   Medications Discontinued During This Encounter  Medication Reason  . spironolactone (ALDACTONE) 25 MG tablet Patient Preference  . sodium bicarbonate 100 mEq in dextrose 5 % 1,000 mL infusion   . sodium bicarbonate 150 mEq in dextrose 5 % 1,000 mL infusion   . sodium bicarbonate 150 mEq in sterile water 1,000 mL infusion     Social History:  reports that he has never smoked. He has never used smokeless tobacco. He reports previous drug use. He reports that he does not drink alcohol.  Family History:  No family history on file.  Pertinent items are noted in HPI. Weight change:   Intake/Output Summary (Last 24 hours) at 08/25/2020 1330 Last data filed at 09/04/2020 4627 Gross per 24 hour  Intake --  Output 280 ml  Net -280 ml   BP 121/68   Pulse 89    Temp 98.2 F (36.8 C) (Oral)   Resp 19   Ht 5\' 7"  (1.702 m)   Wt 59.9 kg   SpO2 99%   BMI 20.67 kg/m  Vitals:   08/26/2020 0700 09/01/2020 0800 09/14/2020 1018 08/28/2020 1100  BP: 103/64 110/64 126/72 121/68  Pulse: 85 88  89  Resp: 19 (!) 26  19  Temp:      TempSrc:      SpO2:  99% 97%  99%  Weight:      Height:         General appearance: fatigued and no distress Head: Normocephalic, without obvious abnormality, atraumatic Eyes: negative findings: lids and lashes normal, conjunctivae and sclerae normal and corneas clear Resp: clear to auscultation bilaterally Cardio: regular rate and rhythm and no rub GI: distended abdomen with incisional hernia on the right, +BS, soft, mild tenderness to deep palpation Extremities: edema 1+ bilateral lower extremity edema  Labs: Basic Metabolic Panel: Recent Labs  Lab 08/08/2020 1733 08/16/2020 2053 08/13/2020 2338 09/15/2020 0338 09/01/2020 0505 09/10/2020 0619 09/03/2020 1155  NA 130* 130*  --   --  131*  --   --   K 6.5* 6.1* 5.6* 5.3* 5.3* 4.9 5.0  CL 100 101  --   --  97*  --   --   CO2 15* 14*  --   --  20*  --   --   GLUCOSE 106* 113*  --   --  141*  --   --   BUN 105* 92*  --   --  84*  --   --   CREATININE 5.09* 5.02* 4.77*  --  4.01*  --   --   ALBUMIN 2.6*  --   --   --  2.3*  --   --   CALCIUM 8.7* 8.6*  --   --  8.3*  --   --    Liver Function Tests: Recent Labs  Lab 08/01/2020 1733 09/11/2020 0505  AST 73* 57*  ALT 22 24  ALKPHOS 150* 137*  BILITOT 0.7 0.7  PROT 6.9 6.1*  ALBUMIN 2.6* 2.3*   No results for input(s): LIPASE, AMYLASE in the last 168 hours. No results for input(s): AMMONIA in the last 168 hours. CBC: Recent Labs  Lab 08/12/2020 1733 09/06/2020 0505  WBC 12.9* 11.2*  HGB 7.4* 6.5*  HCT 23.2* 19.6*  MCV 85.9 83.4  PLT 436* 354   PT/INR: @LABRCNTIP (inr:5) Cardiac Enzymes: )No results for input(s): CKTOTAL, CKMB, CKMBINDEX, TROPONINI in the last 168 hours. CBG: Recent Labs  Lab 08/02/2020 2050  GLUCAP 104*     Iron Studies: No results for input(s): IRON, TIBC, TRANSFERRIN, FERRITIN in the last 168 hours.  Xrays/Other Studies: US Renal  Result Date: 08/09/2020 CLINICAL DATA:  Acute renal failure, history of right nephrectomy for renal malignancy, history of hypertension EXAM: RENAL / URINARY TRACT ULTRASOUND COMPLETE COMPARISON:  Ultrasound 06/22/2020, CT 01/12/2019 FINDINGS: Right Kidney: Surgically absent. No gross abnormality in the right nephrectomy bed aside from the presence of upper abdominal ascites. Left Kidney: Renal measurements: 11.2 x 5.2 x 5.9 cm = volume: 180.6 mL. Echogenicity is within normal limits. No concerning renal mass, shadowing calculus or hydronephrosis. Bladder: Some mild bladder wall thickening is nonspecific, could correlate for features of cystitis. Patient noted a were unable to void despite the prevoid volume of 84 mL. Other: Moderate volume ascites throughout the abdomen. Cirrhotic morphology of the imaged portions of the liver tip. IMPRESSION: 1. Status post right nephrectomy. No gross abnormality in the right nephrectomy bed. 2. Unremarkable sonographic appearance of the left kidney. 3. Mild bladder wall thickening is nonspecific, correlate for features of cystitis. 4. Patient noted in inability to void despite a prevoid volume of 84 mL. Correlate for clinical features of urinary retention. 5. Moderate volume ascites throughout the abdomen. Cirrhotic appearance of the included portions of the liver. Electronically Signed   By: Elwin Sleight.D.  On: 08/18/2020 20:39     Assessment/Plan: 1.  AKI/CKD stage IV- presumably ischemic ATN in setting of poor po intake and concomitant ARB and diuretic use.  BUN/Cr improved overnight after holding ARB and diuretics and IVF's.  1. Continue with gentle IVF replacement 2. Would not resume ARB due to risks outweighing benefits 3. No indication for dialysis at this time and will continue to follow.   2. Metastatic renal cell  carcinoma- failing current chemo and was taken off 08/15/20 with plan to try axitinib, however now with worsening renal function.  Palliative care is following patient. 3. Cirrhosis 4. Hyperkalemia- improved with IVF's and holding ARB 5. Anemia of malignancy/chemo- transfuse prn, no ESA 6. Moderate protein malnutrition- with cirrhosis and metastatic cancer 7. HTN- stable 8. Hyponatremia- multifactorial, edema, AKI.  Improving with IVF's, cont to follow. 9. Disposition- pt with metastatic renal cell carcinoma with progressive disease despite ongoing chemo.  Consider palliative care consult to help set goals/limits of care.    [place] 09/08/2020, 1:30 PM

## 2020-08-23 NOTE — ED Notes (Signed)
Patient repositioned, new brief, and new pad placed. External urinary catheter applied, secured, and placed on suction.

## 2020-08-23 DEATH — deceased

## 2020-08-24 DIAGNOSIS — Z515 Encounter for palliative care: Secondary | ICD-10-CM | POA: Diagnosis not present

## 2020-08-24 DIAGNOSIS — R531 Weakness: Secondary | ICD-10-CM

## 2020-08-24 DIAGNOSIS — N179 Acute kidney failure, unspecified: Secondary | ICD-10-CM | POA: Diagnosis not present

## 2020-08-24 DIAGNOSIS — Z7189 Other specified counseling: Secondary | ICD-10-CM

## 2020-08-24 DIAGNOSIS — N183 Chronic kidney disease, stage 3 unspecified: Secondary | ICD-10-CM | POA: Diagnosis not present

## 2020-08-24 LAB — CBC
HCT: 26.8 % — ABNORMAL LOW (ref 39.0–52.0)
Hemoglobin: 8.8 g/dL — ABNORMAL LOW (ref 13.0–17.0)
MCH: 27.8 pg (ref 26.0–34.0)
MCHC: 32.8 g/dL (ref 30.0–36.0)
MCV: 84.8 fL (ref 80.0–100.0)
Platelets: 339 10*3/uL (ref 150–400)
RBC: 3.16 MIL/uL — ABNORMAL LOW (ref 4.22–5.81)
RDW: 18 % — ABNORMAL HIGH (ref 11.5–15.5)
WBC: 11.9 10*3/uL — ABNORMAL HIGH (ref 4.0–10.5)
nRBC: 0 % (ref 0.0–0.2)

## 2020-08-24 LAB — RENAL FUNCTION PANEL
Albumin: 2.3 g/dL — ABNORMAL LOW (ref 3.5–5.0)
Anion gap: 14 (ref 5–15)
BUN: 92 mg/dL — ABNORMAL HIGH (ref 8–23)
CO2: 19 mmol/L — ABNORMAL LOW (ref 22–32)
Calcium: 8.3 mg/dL — ABNORMAL LOW (ref 8.9–10.3)
Chloride: 98 mmol/L (ref 98–111)
Creatinine, Ser: 4.25 mg/dL — ABNORMAL HIGH (ref 0.61–1.24)
GFR, Estimated: 14 mL/min — ABNORMAL LOW (ref >60)
Glucose, Bld: 116 mg/dL — ABNORMAL HIGH (ref 70–99)
Phosphorus: 6.8 mg/dL — ABNORMAL HIGH (ref 2.5–4.6)
Potassium: 4.6 mmol/L (ref 3.5–5.1)
Sodium: 131 mmol/L — ABNORMAL LOW (ref 135–145)

## 2020-08-24 LAB — TYPE AND SCREEN
ABO/RH(D): A POS
Antibody Screen: NEGATIVE
Unit division: 0

## 2020-08-24 LAB — BPAM RBC
Blood Product Expiration Date: 202202202359
ISSUE DATE / TIME: 202202011355
Unit Type and Rh: 6200

## 2020-08-24 LAB — SARS CORONAVIRUS 2 (TAT 6-24 HRS): SARS Coronavirus 2: NEGATIVE

## 2020-08-24 NOTE — Consult Note (Signed)
Consultation Note Date: 08/24/2020   Patient Name: Stephen Grimes  DOB: 04-02-1951  MRN: [id number]  Age / Sex: 70 y.o., male  PCP: Seward Carol, MD Referring Physician: Edwin Dada, *  Reason for Consultation: Establishing goals of care  HPI/Patient Profile: 70 y.o. male  admitted on 08/09/2020   Clinical Assessment and Goals of Care:  71 year old with metastatic renal cell carcinoma admitted with AKI/CKD IV, cirrhosis, anemia [place]/chemotherapy, moderate protein calorie mal nutrition.  Remains admitted to hospital medicine service, patient has been seen by renal specialist colleagues.  Acute kidney injury is being deemed secondary to possible acute tubular necrosis, possible medication use.  Offending medications are being held.  Renal function is being tracked.  Patient is being given IV fluids.  Chart review, it is noted that the patient is connected with a [facility].  He had 1 consultation with palliative provider in the outpatient setting in fall of last year.  At that time, he had a relatively good functional status, was full code/full scope, and was following with [facility].  Palliative consultation has been requested in this hospitalization for ongoing goals of care discussions.  Patient is resting in bed.  Introduced myself and palliative care as follows:  Palliative medicine is specialized medical care for people living with serious illness. It focuses on providing relief from the symptoms and stress of a serious illness. The goal is to improve quality of life for both the patient and the family.  Goals of care: Broad aims of medical therapy in relation to the patient's values and preferences. Our aim is to provide medical care aimed at enabling patients to achieve the goals that matter most to them, given the circumstances of their particular medical situation and their  constraints.   She states that he is resting.  For the most part he is laying in bed with covers drawn.  He denies any pain, he denies any nausea.  He believes that he is getting better.  He states that he has been told that his renal indices are improving.  Couple of very brief goals of care discussions, he endorses full code/full scope.  Does not wish to engage further.  HCPOA  wife and niece.   SUMMARY OF RECOMMENDATIONS   Full code/full scope of care as per patient's preferences at this point. Recommend resuming outpatient palliative services towards the end of this hospitalization. Palliative Medicine team to follow hospital course and overall disease trajectory during this hospitalization for repeat and ongoing goals of care discussions. Thank you for the consult.  Code Status/Advance Care Planning:  Full code    Symptom Management:   As above.   Palliative Prophylaxis:   Delirium Protocol  Additional Recommendations (Limitations, Scope, Preferences):  Full Scope Treatment  Psycho-social/Spiritual:   Desire for further Chaplaincy support:yes  Additional Recommendations: Education on Hospice  Prognosis:    Unable to determine  Discharge Planning: To Be Determined      Primary Diagnoses: Present on Admission: . Acute renal  failure superimposed on stage 3 chronic kidney disease, unspecified acute renal failure type, unspecified whether stage 3a or 3b CKD ([place]) . Alcoholic cirrhosis of liver without ascites ([place]) . Essential (primary) hypertension . Malignant neoplasm of unspecified kidney, except renal pelvis ([place]) . Hypothyroidism, unspecified . Hyperkalemia . Hyponatremia   I have reviewed the medical record, interviewed the patient and family, and examined the patient. The following aspects are pertinent.  Past Medical History:  Diagnosis Date  . Alcoholic cirrhosis ([place])   . Cancer [facility])    kidney cancer  . Cirrhosis ([place])   . COPD (chronic  obstructive pulmonary disease) ([place])   . Coronary artery disease   . HTN (hypertension)   . Hypertension   . Metastatic renal cell carcinoma ([place])   . Renal cancer ([place])   . Renal disorder   . Renal impairment   . Thrombocytopathia ([place])   . Thrombocytopenia ([place]) 12/11/2011  . Thyroid disease    Social History   Socioeconomic History  . Marital status: Single    Spouse name: Not on file  . Number of children: Not on file  . Years of education: Not on file  . Highest education level: Not on file  Occupational History  . Not on file  Tobacco Use  . Smoking status: Never Smoker  . Smokeless tobacco: Never Used  Vaping Use  . Vaping Use: Never used  Substance and Sexual Activity  . Alcohol use: No  . Drug use: Not Currently  . Sexual activity: Not Currently  Other Topics Concern  . Not on file  Social History Narrative   ** Merged History Encounter **       Social Determinants of Health   Financial Resource Strain: Not on file  Food Insecurity: Not on file  Transportation Needs: Not on file  Physical Activity: Not on file  Stress: Not on file  Social Connections: Not on file   History reviewed. No pertinent family history. Scheduled Meds: . amLODipine  2.5 mg Oral Daily  . aspirin  81 mg Oral Q breakfast  . heparin  5,000 Units Subcutaneous Q8H  . levothyroxine  100 mcg Oral QAC breakfast  . pantoprazole  40 mg Oral Daily  . sodium zirconium cyclosilicate  10 g Oral BID  . thiamine  100 mg Oral Daily   Continuous Infusions: . sodium chloride 75 mL/hr at 08/24/20 0031   PRN Meds:.acetaminophen **OR** acetaminophen, calcium carbonate (dosed in mg elemental calcium), camphor-menthol **AND** hydrOXYzine, docusate sodium, feeding supplement (NEPRO CARB STEADY), ondansetron **OR** ondansetron (ZOFRAN) IV, sorbitol, triamcinolone, zolpidem Medications Prior to Admission:  Prior to Admission medications   Medication Sig Start Date End Date Taking? Authorizing  Provider  amLODipine (NORVASC) 2.5 MG tablet Take 2.5 mg by mouth daily.   Yes [provider]  aspirin 81 MG chewable tablet Chew 1 tablet (81 mg total) by mouth daily with breakfast. 11/23/18  Yes Emokpae, Courage, MD  levothyroxine (SYNTHROID) 100 MCG tablet Take 1 tablet (100 mcg total) by mouth daily before breakfast. 11/23/18  Yes Emokpae, Courage, MD  olmesartan (BENICAR) 5 MG tablet Take 5 mg by mouth daily.   Yes [provider]  omeprazole (PRILOSEC) 20 MG capsule Take 20 mg by mouth daily. 02/25/19  Yes [provider]  thiamine (VITAMIN B-1) 100 MG tablet Take 100 mg by mouth daily.   Yes [provider]  triamcinolone (KENALOG) 0.1 % Apply 1 application topically daily as needed (itching). 08/05/20  Yes [provider]  furosemide (LASIX) 20 MG tablet Take 20 mg by mouth daily. 12/05/18   [provider]   No Known Allergies Review of Systems + weakness.   Physical Exam NAD Regular work of breathing Abdomen distended  No edema  S 1 S 2  Vital Signs: BP 126/66 (BP Location: Left Arm)   Pulse 91   Temp (!) 97.2 F (36.2 C) (Oral)   Resp (!) 24   Ht 5\' 7"  (1.702 m)   Wt 59.6 kg   SpO2 97%   BMI 20.58 kg/m  Pain Scale: 0-10   Pain Score: 0-No pain   SpO2: SpO2: 97 % O2 Device:SpO2: 97 % O2 Flow Rate: .   IO: Intake/output summary:   Intake/Output Summary (Last 24 hours) at 08/24/2020 1459 Last data filed at 08/24/2020 1104 Gross per 24 hour  Intake 1313.53 ml  Output -  Net 1313.53 ml    LBM: Last BM Date: 08/11/20 Baseline Weight: Weight: 59.9 kg Most recent weight: Weight: 59.6 kg     Palliative Assessment/Data:   40%  Time In: 1400 Time Out:  1500 Time Total:  60 min.  Greater than 50%  of this time was spent counseling and coordinating care related to the above assessment and plan.  Signed by: Loistine Chance, MD   Please contact Palliative Medicine Team phone at [phone] for questions and concerns.   For individual provider: See Shea Evans

## 2020-08-24 NOTE — Evaluation (Signed)
Physical Therapy Evaluation Patient Details Name: Stephen Grimes MRN: [id number] DOB: 18-Feb-1951 Today's Date: 08/24/2020   History of Present Illness  70 yo male admitted with ARF on CKD. Hx of met renal cell ca, CAD, cirrhosis, COPD, CKD  Clinical Impression  On eval, pt required Min assist for ambulation. He walked ~60 feet around the unit. Pt presents with general weakness, decreased activity tolerance, and impaired gait and balance. He is unsteady and at risk for falls. Pt likely needs to use, at the very least, a cane for ambulation safety. Will plan to follow and progress activity as tolerated.     Follow Up Recommendations Home health PT    Equipment Recommendations  None recommended by PT    Recommendations for Other Services       Precautions / Restrictions Precautions Precautions: Fall Restrictions Weight Bearing Restrictions: No      Mobility  Bed Mobility Overal bed mobility: Needs Assistance Bed Mobility: Supine to Sit;Sit to Supine     Supine to sit: Supervision;HOB elevated Sit to supine: Supervision;HOB elevated        Transfers Overall transfer level: Needs assistance   Transfers: Sit to/from Stand Sit to Stand: Min guard         General transfer comment: Min guard for safety. Increased time.  Ambulation/Gait Ambulation/Gait assistance: Min assist Gait Distance (Feet): 60 Feet Assistive device: None Gait Pattern/deviations: Step-through pattern;Decreased stride length     General Gait Details: Unsteady. Assist to stabilize intermittently. Slow gait speed. Dyspnea 2/4  Stairs            Wheelchair Mobility    Modified Rankin (Stroke Patients Only)       Balance Overall balance assessment: Needs assistance           Standing balance-Leahy Scale: Fair                               Pertinent Vitals/Pain Pain Assessment: No/denies pain    Home Living Family/patient expects to be discharged to:: Private  residence Living Arrangements: Spouse/significant other Available Help at Discharge: Family;Available PRN/intermittently Type of Home: House Home Access: Stairs to enter Entrance Stairs-Rails: Left Entrance Stairs-Number of Steps: 4 Home Layout: One level Home Equipment: Cane - single point      Prior Function Level of Independence: Independent               Hand Dominance        Extremity/Trunk Assessment   Upper Extremity Assessment Upper Extremity Assessment: Defer to OT evaluation    Lower Extremity Assessment Lower Extremity Assessment: Generalized weakness    Cervical / Trunk Assessment Cervical / Trunk Assessment: Normal  Communication   Communication: No difficulties  Cognition Arousal/Alertness: Awake/alert Behavior During Therapy: WFL for tasks assessed/performed Overall Cognitive Status: Within Functional Limits for tasks assessed                                        General Comments      Exercises     Assessment/Plan    PT Assessment Patient needs continued PT services  PT Problem List Decreased strength;Decreased mobility;Decreased activity tolerance;Decreased balance;Decreased knowledge of use of DME       PT Treatment Interventions DME instruction;Gait training;Therapeutic activities;Therapeutic exercise;Patient/family education;Balance training;Functional mobility training    PT Goals (Current goals can be found  in the Care Plan section)  Acute Rehab PT Goals Patient Stated Goal: to get better and get home soon PT Goal Formulation: With patient Time For Goal Achievement: 09/07/20 Potential to Achieve Goals: Good    Frequency Min 3X/week   Barriers to discharge        Co-evaluation               AM-PAC PT "6 Clicks" Mobility  Outcome Measure Help needed turning from your back to your side while in a flat bed without using bedrails?: A Little Help needed moving from lying on your back to sitting on the  side of a flat bed without using bedrails?: A Little Help needed moving to and from a bed to a chair (including a wheelchair)?: A Little Help needed standing up from a chair using your arms (e.g., wheelchair or bedside chair)?: A Little Help needed to walk in hospital room?: A Little Help needed climbing 3-5 steps with a railing? : A Little 6 Click Score: 18    End of Session Equipment Utilized During Treatment: Gait belt Activity Tolerance: Patient limited by fatigue Patient left: in bed;with call bell/phone within reach;with bed alarm set   PT Visit Diagnosis: Muscle weakness (generalized) (M62.81);Unsteadiness on feet (R26.81)    Time: 1115-1130 PT Time Calculation (min) (ACUTE ONLY): 15 min   Charges:   PT Evaluation $PT Eval Moderate Complexity: 1 Mod             Doreatha Massed, PT Acute Rehabilitation  Office: [phone] Pager: [phone]

## 2020-08-24 NOTE — Progress Notes (Signed)
[facility] PROGRESS NOTE    ARISH REDNER  EHO:[id number] DOB: Nov 25, 1950 DOA: 08/05/2020 PCP: Seward Carol, MD      Brief Narrative:  Mr. Gladu is a 70 y.o. M with CKD IV baseline Cr 2.1-2.5, cirrhosis, renal cell cancer s/p nephrectomy 2015, now with metastasis to peritoneum, right renal fossa, and possibly bone, CAD s/p STEMI and Takotsubo associated with Lenvatinib, HTN, ventral hernia, hypothyroidism who presented with abnormal labs.  Patient is getting medical work up to resume chemotherapy with Dr. Arthur Holms at [facility] when he was found to have creatinine up to 4 mg/dL, sent to the ER.  Lasix and ARB stopped.  Started on fluids.       Assessment & Plan:  Acute renal failure on chronic kidney disease stage IV Baseline Cr is 2.1-2.4. Here 5.09 > 5.02 > 4.77 > 4.01 > 4.25 today  UOP not measured.  No swelling, dyspnea, acidosis, uremia.  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     Hypertension Coronary disease, secondary prevention BP normal -Continue aspirin -Hold amlodipine  Hypothyroidism -Continue levothyroxine  Cirrhosis Well compensated without significant portal hypertension.  His ascites is probably malignant.  GERD -Continue pantoprazole  Hyperkalemia Resolved  Hyponatremia Mild, asymptomatic  Anemia of chronic kidney disease Stable, no clinical bleeding           Disposition: Status is: Inpatient  Remains inpatient appropriate because:IV treatments appropriate due to intensity of illness or inability to take PO   Dispo: The patient is from: Home              Anticipated d/c is to: Home               Anticipated d/c date is: 2 days              Patient currently is not medically stable to d/c.   Difficult to place patient No       Level of care: Telemetry       MDM: The below labs and imaging reports were reviewed and summarized above.  Medication management as above.    DVT prophylaxis: heparin injection 5,000 Units Start: 08/14/2020 2345  Code Status: FULL Family Communication:            Subjective: He is feeling more energy, better appetite.  He has had no confusion, fever, dyspnea, swelling.  He has had no chest pain.  No orthopnea.      Objective: Vitals:   09/13/2020 2154 08/24/20 0515 08/24/20 1329 08/24/20 2015  BP: (!) 124/58 (!) 117/58 126/66 115/60  Pulse: 91 91 91 92  Resp: 20 20 (!) 24 18  Temp: 98.2 F (36.8 C) 98.6 F (37 C) (!) 97.2 F (36.2 C) 97.8 F (36.6 C)  TempSrc: Axillary Axillary Oral Oral  SpO2: 100%  97% 97%  Weight:  59.6 kg    Height:        Intake/Output Summary (Last 24 hours) at 08/24/2020 2150 Last data filed at 08/24/2020 2146 Gross per 24 hour  Intake 1433.53 ml  Output 300 ml  Net 1133.53 ml   Filed Weights   08/15/2020 1716 08/24/20 0515  Weight: 59.9 kg 59.6 kg  Examination: General appearance: Thin, chronically ill-appearing adult male, alert and in no obvious distress.  Sitting on the edge of the bed, eating breakfast. HEENT: Anicteric, conjunctiva pink, lids and lashes normal. No nasal deformity, discharge, epistaxis.  Lips moist oropharynx moist, no oral lesions.  Hearing normal Skin: Warm and dry.  No jaundice.  No suspicious rashes or lesions. Cardiac: RRR, nl S1-S2, no murmurs appreciated.  Capillary refill is brisk.  JVP normal.  Trace nonpitting LE edema.  Radial pulses 2+ and symmetric. Respiratory: Normal respiratory rate and rhythm.  CTAB without rales or wheezes. Abdomen: Abdomen with large ventral hernia.  No TTP or guarding.  Moderate nontense ascites, distention.  I do not appreciate  liver edge. MSK: No deformities or effusions.  Reduced muscle mass and fat. Neuro: Awake and alert.  EOMI, moves all extremities with symmetric generalized weakness. Speech fluent.    Psych: Sensorium intact and responding to questions, attention normal. Affect blunted.  Judgment and insight appear normal.    Data Reviewed: I have personally reviewed following labs and imaging studies:  CBC: Recent Labs  Lab 08/02/2020 1733 09/11/2020 0505 08/24/20 0447  WBC 12.9* 11.2* 11.9*  HGB 7.4* 6.5* 8.8*  HCT 23.2* 19.6* 26.8*  MCV 85.9 83.4 84.8  PLT 436* 354 379   Basic Metabolic Panel: Recent Labs  Lab 08/17/2020 1733 07/24/2020 2053 07/25/2020 2338 09/14/2020 0338 09/02/2020 0505 09/08/2020 0619 09/19/2020 1155 08/24/20 0447  NA 130* 130*  --   --  131*  --   --  131*  K 6.5* 6.1* 5.6* 5.3* 5.3* 4.9 5.0 4.6  CL 100 101  --   --  97*  --   --  98  CO2 15* 14*  --   --  20*  --   --  19*  GLUCOSE 106* 113*  --   --  141*  --   --  116*  BUN 105* 92*  --   --  84*  --   --  92*  CREATININE 5.09* 5.02* 4.77*  --  4.01*  --   --  4.25*  CALCIUM 8.7* 8.6*  --   --  8.3*  --   --  8.3*  PHOS  --   --   --   --   --   --   --  6.8*   GFR: Estimated Creatinine Clearance: 13.4 mL/min (A) (by C-G formula based on SCr of 4.25 mg/dL (H)). Liver Function Tests: Recent Labs  Lab 07/28/2020 1733 09/11/2020 0505 08/24/20 0447  AST 73* 57*  --   ALT 22 24  --   ALKPHOS 150* 137*  --   BILITOT 0.7 0.7  --   PROT 6.9 6.1*  --   ALBUMIN 2.6* 2.3* 2.3*   No results for input(s): LIPASE, AMYLASE in the last 168 hours. No results for input(s): AMMONIA in the last 168 hours. Coagulation Profile: No results for input(s): INR, PROTIME in the last 168 hours. Cardiac Enzymes: No results for input(s): CKTOTAL, CKMB, CKMBINDEX, TROPONINI in the last 168 hours. BNP (last 3 results) No results for input(s): PROBNP in the last 8760 hours. HbA1C: No results for input(s): HGBA1C in the last 72  hours. CBG: Recent Labs  Lab 08/17/2020 2050  GLUCAP 104*   Lipid Profile: No results for input(s): CHOL, HDL, LDLCALC, TRIG, CHOLHDL, LDLDIRECT in the last 72 hours. Thyroid Function Tests: No results for input(s): TSH, T4TOTAL, FREET4, T3FREE, THYROIDAB in the last 72 hours. Anemia Panel:  No results for input(s): VITAMINB12, FOLATE, FERRITIN, TIBC, IRON, RETICCTPCT in the last 72 hours. Urine analysis:    Component Value Date/Time   COLORURINE YELLOW 11/16/2018 1629   APPEARANCEUR CLEAR 11/16/2018 1629   LABSPEC 1.006 11/16/2018 1629   PHURINE 5.0 11/16/2018 1629   GLUCOSEU NEGATIVE 11/16/2018 1629   HGBUR SMALL (A) 11/16/2018 1629   BILIRUBINUR NEGATIVE 11/16/2018 1629   KETONESUR NEGATIVE 11/16/2018 1629   PROTEINUR 30 (A) 11/16/2018 1629   NITRITE NEGATIVE 11/16/2018 1629   LEUKOCYTESUR NEGATIVE 11/16/2018 1629   Sepsis Labs: @LABRCNTIP (procalcitonin:4,lacticacidven:4)  ) Recent Results (from the past 240 hour(s))  SARS CORONAVIRUS 2 (TAT 6-24 HRS) Nasopharyngeal Nasopharyngeal Swab     Status: None   Collection Time: 08/19/20 12:44 PM   Specimen: Nasopharyngeal Swab  Result Value Ref Range Status   SARS Coronavirus 2 NEGATIVE NEGATIVE Final    Comment: (NOTE) SARS-CoV-2 target nucleic acids are NOT DETECTED.  The SARS-CoV-2 RNA is generally detectable in upper and lower respiratory specimens during the acute phase of infection. Negative results do not preclude SARS-CoV-2 infection, do not rule out co-infections with other pathogens, and should not be used as the sole basis for treatment or other patient management decisions. Negative results must be combined with clinical observations, patient history, and epidemiological information. The expected result is Negative.  Fact Sheet for Patients: SugarRoll.be  Fact Sheet for Healthcare Providers: https://www.woods-mathews.com/  This test is not yet approved or cleared by  the [place] FDA and  has been authorized for detection and/or diagnosis of SARS-CoV-2 by FDA under an Emergency Use Authorization (EUA). This EUA will remain  in effect (meaning this test can be used) for the duration of the COVID-19 declaration under Se ction 564(b)(1) of the Act, 21 U.S.C. section 360bbb-3(b)(1), unless the authorization is terminated or revoked sooner.  Performed at [facility], [place] [address] [place], [place] [address]   SARS CORONAVIRUS 2 (TAT 6-24 HRS) Nasopharyngeal Nasopharyngeal Swab     Status: None   Collection Time: 09/11/2020  4:36 PM   Specimen: Nasopharyngeal Swab  Result Value Ref Range Status   SARS Coronavirus 2 NEGATIVE NEGATIVE Final    Comment: (NOTE) SARS-CoV-2 target nucleic acids are NOT DETECTED.  The SARS-CoV-2 RNA is generally detectable in upper and lower respiratory specimens during the acute phase of infection. Negative results do not preclude SARS-CoV-2 infection, do not rule out co-infections with other pathogens, and should not be used as the sole basis for treatment or other patient management decisions. Negative results must be combined with clinical observations, patient history, and epidemiological information. The expected result is Negative.  Fact Sheet for Patients: SugarRoll.be  Fact Sheet for Healthcare Providers: https://www.woods-mathews.com/  This test is not yet approved or cleared by the [place] FDA and  has been authorized for detection and/or diagnosis of SARS-CoV-2 by FDA under an Emergency Use Authorization (EUA). This EUA will remain  in effect (meaning this test can be used) for the duration of the COVID-19 declaration under Se ction 564(b)(1) of the Act, 21 U.S.C. section 360bbb-3(b)(1), unless the authorization is terminated or revoked sooner.  Performed at [facility], [place] [address], [place], [place] [address]          Radiology  Studies: No results found.      Scheduled Meds: . amLODipine  2.5 mg Oral Daily  . aspirin  81 mg Oral Q breakfast  . heparin  5,000 Units Subcutaneous Q8H  . levothyroxine  100 mcg Oral QAC breakfast  . pantoprazole  40 mg Oral Daily  . sodium zirconium cyclosilicate  10 g Oral BID  . thiamine  100 mg Oral Daily   Continuous Infusions: . sodium chloride 75 mL/hr at 08/24/20 0031     LOS: 2 days    Time spent: 25 minutes    Edwin Dada, MD [facility] 08/24/2020, 9:50 PM     Please page though [place] or Epic secure chat:  For [employer], Adult nurse

## 2020-08-24 NOTE — Progress Notes (Signed)
Patient ID: Stephen Grimes, male   DOB: Nov 22, 1950, 70 y.o.   MRN: [id number] S: Starting to feel a little better. O:BP (!) 117/58 (BP Location: Left Arm)   Pulse 91   Temp 98.6 F (37 C) (Axillary)   Resp 20   Ht 5\' 7"  (1.702 m)   Wt 59.6 kg   SpO2 100%   BMI 20.58 kg/m   Intake/Output Summary (Last 24 hours) at 08/24/2020 1253 Last data filed at 08/24/2020 1104 Gross per 24 hour  Intake 1623.53 ml  Output --  Net 1623.53 ml   Intake/Output: I/O last 3 completed shifts: In: 1383.5 [P.O.:360; I.V.:713.5; Blood:310] Out: 280 [Urine:280]  Intake/Output this shift:  Total I/O In: 240 [P.O.:240] Out: -  Weight change: -0.275 kg Gen:NAD CVS: RRR Resp: cta Abd: distended, +BS, soft, NT Ext: no edema  Recent Labs  Lab 08/14/2020 1733 08/15/2020 2053 08/09/2020 2338 09/05/2020 0338 09/01/2020 0505 09/07/2020 0619 09/06/2020 1155 08/24/20 0447  NA 130* 130*  --   --  131*  --   --  131*  K 6.5* 6.1* 5.6* 5.3* 5.3* 4.9 5.0 4.6  CL 100 101  --   --  97*  --   --  98  CO2 15* 14*  --   --  20*  --   --  19*  GLUCOSE 106* 113*  --   --  141*  --   --  116*  BUN 105* 92*  --   --  84*  --   --  92*  CREATININE 5.09* 5.02* 4.77*  --  4.01*  --   --  4.25*  ALBUMIN 2.6*  --   --   --  2.3*  --   --  2.3*  CALCIUM 8.7* 8.6*  --   --  8.3*  --   --  8.3*  PHOS  --   --   --   --   --   --   --  6.8*  AST 73*  --   --   --  57*  --   --   --   ALT 22  --   --   --  24  --   --   --    Liver Function Tests: Recent Labs  Lab 08/17/2020 1733 09/13/2020 0505 08/24/20 0447  AST 73* 57*  --   ALT 22 24  --   ALKPHOS 150* 137*  --   BILITOT 0.7 0.7  --   PROT 6.9 6.1*  --   ALBUMIN 2.6* 2.3* 2.3*   No results for input(s): LIPASE, AMYLASE in the last 168 hours. No results for input(s): AMMONIA in the last 168 hours. CBC: Recent Labs  Lab 07/29/2020 1733 09/05/2020 0505 08/24/20 0447  WBC 12.9* 11.2* 11.9*  HGB 7.4* 6.5* 8.8*  HCT 23.2* 19.6* 26.8*  MCV 85.9 83.4 84.8  PLT 436* 354 339    Cardiac Enzymes: No results for input(s): CKTOTAL, CKMB, CKMBINDEX, TROPONINI in the last 168 hours. CBG: Recent Labs  Lab 07/24/2020 2050  GLUCAP 104*    Iron Studies: No results for input(s): IRON, TIBC, TRANSFERRIN, FERRITIN in the last 72 hours. Studies/Results: US Renal  Result Date: 07/24/2020 CLINICAL DATA:  Acute renal failure, history of right nephrectomy for renal malignancy, history of hypertension EXAM: RENAL / URINARY TRACT ULTRASOUND COMPLETE COMPARISON:  Ultrasound 06/22/2020, CT 01/12/2019 FINDINGS: Right Kidney: Surgically absent. No gross abnormality in the right nephrectomy bed aside from the presence  of upper abdominal ascites. Left Kidney: Renal measurements: 11.2 x 5.2 x 5.9 cm = volume: 180.6 mL. Echogenicity is within normal limits. No concerning renal mass, shadowing calculus or hydronephrosis. Bladder: Some mild bladder wall thickening is nonspecific, could correlate for features of cystitis. Patient noted a were unable to void despite the prevoid volume of 84 mL. Other: Moderate volume ascites throughout the abdomen. Cirrhotic morphology of the imaged portions of the liver tip. IMPRESSION: 1. Status post right nephrectomy. No gross abnormality in the right nephrectomy bed. 2. Unremarkable sonographic appearance of the left kidney. 3. Mild bladder wall thickening is nonspecific, correlate for features of cystitis. 4. Patient noted in inability to void despite a prevoid volume of 84 mL. Correlate for clinical features of urinary retention. 5. Moderate volume ascites throughout the abdomen. Cirrhotic appearance of the included portions of the liver. Electronically Signed   By: Lovena Le M.D.   On: 07/31/2020 20:39   . amLODipine  2.5 mg Oral Daily  . aspirin  81 mg Oral Q breakfast  . heparin  5,000 Units Subcutaneous Q8H  . levothyroxine  100 mcg Oral QAC breakfast  . pantoprazole  40 mg Oral Daily  . sodium zirconium cyclosilicate  10 g Oral BID  . thiamine  100 mg  Oral Daily    BMET    Component Value Date/Time   NA 131 (L) 08/24/2020 0447   K 4.6 08/24/2020 0447   CL 98 08/24/2020 0447   CO2 19 (L) 08/24/2020 0447   GLUCOSE 116 (H) 08/24/2020 0447   BUN 92 (H) 08/24/2020 0447   CREATININE 4.25 (H) 08/24/2020 0447   CALCIUM 8.3 (L) 08/24/2020 0447   GFRNONAA 14 (L) 08/24/2020 0447   GFRAA >60 11/22/2018 0518   CBC    Component Value Date/Time   WBC 11.9 (H) 08/24/2020 0447   RBC 3.16 (L) 08/24/2020 0447   HGB 8.8 (L) 08/24/2020 0447   HGB 13.8 12/11/2011 1531   HCT 26.8 (L) 08/24/2020 0447   HCT 32.0 (L) 09/16/2018 0635   HCT 40.3 12/11/2011 1531   PLT 339 08/24/2020 0447   PLT 64 (L) 12/11/2011 1531   MCV 84.8 08/24/2020 0447   MCV 99.5 (H) 12/11/2011 1531   MCH 27.8 08/24/2020 0447   MCHC 32.8 08/24/2020 0447   RDW 18.0 (H) 08/24/2020 0447   RDW 13.3 12/11/2011 1531   LYMPHSABS 1.4 11/16/2018 1241   LYMPHSABS 1.5 12/11/2011 1531   MONOABS 0.7 11/16/2018 1241   MONOABS 0.6 12/11/2011 1531   EOSABS 0.0 11/16/2018 1241   EOSABS 0.0 12/11/2011 1531   BASOSABS 0.0 11/16/2018 1241   BASOSABS 0.0 12/11/2011 1531     Assessment/Plan: 1.  AKI/CKD stage IV- is normally followed by Nephrology at [facility] (Dr. Smith Mince) who has noted that his baseline Scr has been climbing over the past year from 1.6-1.9 up to 2-2.5 prior to his hospitalization.  The AKI/CKD stage IV is presumably due to ischemic ATN in setting of poor po intake and concomitant ARB and diuretic use.  BUN/Cr improved initially after holding ARB and diuretics and giving IVF's however it has reached a plateau around 4.25. 1. Will restart IVF's given poor po intake 2. Would not resume ARB due to risks outweighing benefits 3. No indication for dialysis at this time and will continue to follow.   4. Recommend palliative care consult to help set goals/limits of care as I do not think he would do well with HD 2. Metastatic renal cell carcinoma-  failing current chemo and was taken off  08/15/20 with plan to try axitinib, however now with worsening renal function.  Palliative care is following patient. 3. Cirrhosis 4. Hyperkalemia- improved with IVF's and holding ARB 5. Anemia of malignancy/chemo- transfuse prn, no ESA 6. Moderate protein malnutrition- with cirrhosis and metastatic cancer 7. HTN- stable 8. Hyponatremia- multifactorial, edema, AKI.  Improving with IVF's, cont to follow. 9. Disposition- pt with metastatic renal cell carcinoma with progressive disease despite ongoing chemo.  Consider palliative care consult to help set goals/limits of care.    Donetta Potts, MD [employer] [phone]

## 2020-08-24 NOTE — Progress Notes (Signed)
Rn was was notified by NT about patients concern regarding a blood on his stool, RN went and assessed, noticed fresh blood on the  toilet tissue. Patient denies any history of hemorrhoids but complains of soreness around the area every time he wiped patient was then instructed to do an skin assessment next time he uses the bathroom .We will continue to monitor.

## 2020-08-24 NOTE — Progress Notes (Signed)
Pt alert and aware in bed. Pt states he has been talking to a lot folks here at the hospital. He says the busyness was wore him out. He said has no needs at present. The chaplain offered caring presence.

## 2020-08-25 DIAGNOSIS — D649 Anemia, unspecified: Secondary | ICD-10-CM

## 2020-08-25 DIAGNOSIS — N179 Acute kidney failure, unspecified: Secondary | ICD-10-CM | POA: Diagnosis not present

## 2020-08-25 DIAGNOSIS — I499 Cardiac arrhythmia, unspecified: Secondary | ICD-10-CM | POA: Diagnosis not present

## 2020-08-25 DIAGNOSIS — Z8679 Personal history of other diseases of the circulatory system: Secondary | ICD-10-CM

## 2020-08-25 DIAGNOSIS — N1832 Chronic kidney disease, stage 3b: Secondary | ICD-10-CM

## 2020-08-25 DIAGNOSIS — E875 Hyperkalemia: Secondary | ICD-10-CM | POA: Diagnosis not present

## 2020-08-25 DIAGNOSIS — J449 Chronic obstructive pulmonary disease, unspecified: Secondary | ICD-10-CM

## 2020-08-25 DIAGNOSIS — E46 Unspecified protein-calorie malnutrition: Secondary | ICD-10-CM

## 2020-08-25 LAB — RENAL FUNCTION PANEL
Albumin: 2.1 g/dL — ABNORMAL LOW (ref 3.5–5.0)
Anion gap: 14 (ref 5–15)
BUN: 88 mg/dL — ABNORMAL HIGH (ref 8–23)
CO2: 19 mmol/L — ABNORMAL LOW (ref 22–32)
Calcium: 8.1 mg/dL — ABNORMAL LOW (ref 8.9–10.3)
Chloride: 100 mmol/L (ref 98–111)
Creatinine, Ser: 4.25 mg/dL — ABNORMAL HIGH (ref 0.61–1.24)
GFR, Estimated: 14 mL/min — ABNORMAL LOW (ref >60)
Glucose, Bld: 108 mg/dL — ABNORMAL HIGH (ref 70–99)
Phosphorus: 7.1 mg/dL — ABNORMAL HIGH (ref 2.5–4.6)
Potassium: 3.9 mmol/L (ref 3.5–5.1)
Sodium: 133 mmol/L — ABNORMAL LOW (ref 135–145)

## 2020-08-25 LAB — CBC
HCT: 25.5 % — ABNORMAL LOW (ref 39.0–52.0)
Hemoglobin: 8.4 g/dL — ABNORMAL LOW (ref 13.0–17.0)
MCH: 28.2 pg (ref 26.0–34.0)
MCHC: 32.9 g/dL (ref 30.0–36.0)
MCV: 85.6 fL (ref 80.0–100.0)
Platelets: 352 10*3/uL (ref 150–400)
RBC: 2.98 MIL/uL — ABNORMAL LOW (ref 4.22–5.81)
RDW: 18.4 % — ABNORMAL HIGH (ref 11.5–15.5)
WBC: 13.1 10*3/uL — ABNORMAL HIGH (ref 4.0–10.5)
nRBC: 0 % (ref 0.0–0.2)

## 2020-08-25 NOTE — Plan of Care (Signed)
  Problem: Health Behavior/Discharge Planning: Goal: Ability to manage health-related needs will improve Outcome: Progressing   Problem: Clinical Measurements: Goal: Will remain free from infection Outcome: Progressing   Problem: Pain Managment: Goal: General experience of comfort will improve Outcome: Progressing   Problem: Safety: Goal: Ability to remain free from injury will improve Outcome: Progressing   Problem: Education: Goal: Knowledge of General Education information will improve Description: Including pain rating scale, medication(s)/side effects and non-pharmacologic comfort measures Outcome: Progressing   Problem: Health Behavior/Discharge Planning: Goal: Ability to manage health-related needs will improve Outcome: Progressing

## 2020-08-25 NOTE — Assessment & Plan Note (Addendum)
-  patient has history of CKD3b. Baseline creat ~ 2.2 - 2.4, eGFR 33 - patient presents with increase in creat >0.3 mg/dL above baseline, creat increase >1.5x baseline presumed to have occurred within past 7 days PTA - nephrology following; patient considered poor dialysis candidate at this time -No obstructive etiology noted on renal ultrasound -GOC discussion held bedside on 08/25/2020. As of 2/5, he has elected for DNR/DNI due to minimal improvement and decrease overall in quality of life.  - RRT not an option per nephrology due to functional status and malignancy, I also agree with this; patient also understands and this has been discussed personally with patient - his SOB described on 2/4 was moreso attributed to his physical exertion that day and talking on a phone a lot he says; he did not think the IVF made him feel SOB. Therefore, since RRT not an option, I discussed we would try IVF again and he can inform us if he feels SOB at which time we can stop fluids again but for now if wishing for any medical treatment, this is his best option; he is also in agreement - by 2/6 he continues to decline and now is showing signs of uremia and that he is approaching end of life; discussed [place] with niece and plan is for comfort care at this time with in hospital death expected

## 2020-08-25 NOTE — Hospital Course (Addendum)
Stephen Grimes is a 70 y.o. male with medical history significant of right renal cell carcinoma status post right nephrectomy 6 years ago, liver cirrhosis, coronary artery disease, essential hypertension, previous history of of alcoholism, hypothyroidism and COPD who has been doing well after his nephrectomy and chemotherapy for his renal cell carcinoma , was told that he was failing chemotherapy, was being evaluated for EGD and echocardiogram in preparation for new chemo, and his labs revealed creatinine of 4.  He was sent to ED for further evaluation.  On arrival to ED he was found to have a creatinine of 5 and hyperkalemic with a potassium of 6.5 He was referred to [facility] for admission .  Nephrology consulted for further recommendations.  Ultrasound of the renal did not show any obstructive pathology. Given his multiple comorbidities with underlying malignancy, palliative care was consulted.  He initially expressed wishes to remain full code with full measures for treatment. As hospitalization progressed and his quality of life began to decrease along with no significant recovery of renal function, goals of care were again discussed on 08/27/2020 with palliative care.  At that time, he elected for transitioning to a DNR/DNI.  He still wished to continue treatment medically as able. He again was evaluated by nephrology, and not felt to be a good dialysis candidate given his deteriorating functional status and underlying aggressive malignancy. Plan will be to continue fluids as he can tolerate and monitor renal function.  On 2/6, patient appeared to have declined even further. His renal function continued to worsen and he became more lethargic and confused. GOC were again discussed with family and ultimately since patient appears to be approaching end of life, the goal became to pursue comfort; with an in-hospital death expected as patient was not considered stable enough for discharging to residential  hospice.   Family was able to visit and comfort care was continued.  Patient continued to decline and passed naturally at 0400 on September 21, 2020.

## 2020-08-25 NOTE — Assessment & Plan Note (Addendum)
-  Patient does have history of Takotsubo cardiomyopathy however no noted history of A. Fib. -EKG obtained on 08/25/2020 and personally reviewed.  Sinus rhythm with significant sinus arrhythmia.  PR 152, QTc 490 -No further work-up at this time.  Findings discussed with patient on diagnosis

## 2020-08-25 NOTE — Assessment & Plan Note (Addendum)
-  Presumed anemia of chronic disease due to underlying CKD, renal cell carcinoma and right nephrectomy -Hemoglobin 6.5 g/dL on admission, given 1 unit PRBC

## 2020-08-25 NOTE — Evaluation (Signed)
Occupational Therapy Evaluation Patient Details Name: Stephen Grimes MRN: [id number] DOB: [birth year] Today's Date: 08/25/2020    History of Present Illness 70 yo male admitted with ARF on CKD. Hx of met renal cell ca, CAD, cirrhosis, COPD, CKD   Clinical Impression   Stephen Grimes is a 70 year old man who presents to the hospital with above medical history. On evaluation patient demonstrates functional upper body strength, ability to perform bed mobility and ADLs. Patient ambulated two laps in room limited by IV pole and external catheter. Patient demonstrates the physical abilities to perform all aspects of ADLs - only limited by hospital environment and equipment. Patient reports having assistance from spouse and family at home, a shower chair and RW if needed. Patient has no OT needs at this time.    Follow Up Recommendations  No OT follow up    Equipment Recommendations  None recommended by OT    Recommendations for Other Services       Precautions / Restrictions Precautions Precautions: Fall Restrictions Weight Bearing Restrictions: No      Mobility Bed Mobility Overal bed mobility: Needs Assistance Bed Mobility: Supine to Sit;Sit to Supine     Supine to sit: Supervision;HOB elevated Sit to supine: Supervision;HOB elevated        Transfers Overall transfer level: Needs assistance Equipment used: None Transfers: Sit to/from Stand Sit to Stand: Min guard         General transfer comment: Min guard to ambulate in room x 2 laps. No overt loss of balance. Gait slow - limited by IV and external catheter.    Balance Overall balance assessment: No apparent balance deficits (not formally assessed)                                         ADL either performed or assessed with clinical judgement   ADL Overall ADL's : Modified independent                                             Vision Patient Visual Report: No change  from baseline       Perception     Praxis      Pertinent Vitals/Pain Pain Assessment: No/denies pain     Hand Dominance     Extremity/Trunk Assessment     Lower Extremity Assessment Lower Extremity Assessment: Defer to PT evaluation       Communication Communication Communication: No difficulties   Cognition Arousal/Alertness: Awake/alert Behavior During Therapy: WFL for tasks assessed/performed Overall Cognitive Status: Within Functional Limits for tasks assessed                                     General Comments       Exercises     Shoulder Instructions      Home Living Family/patient expects to be discharged to:: Private residence Living Arrangements: Spouse/significant other Available Help at Discharge: Family;Available PRN/intermittently Type of Home: House Home Access: Stairs to enter [employer] of Steps: 4 Entrance Stairs-Rails: Left Home Layout: One level     Bathroom Shower/Tub: Teacher, early years/pre: Standard     Home Equipment: Cane - single point;Walker -  2 wheels;Shower seat          Prior Functioning/Environment Level of Independence: Independent                 OT Problem List:        OT Treatment/Interventions:      OT Goals(Current goals can be found in the care plan section) Acute Rehab OT Goals Patient Stated Goal: to get better and get home soon OT Goal Formulation: All assessment and education complete, DC therapy  OT Frequency:     Barriers to D/C:            Co-evaluation              AM-PAC OT "6 Clicks" Daily Activity     Outcome Measure Help from another person eating meals?: None Help from another person taking care of personal grooming?: None Help from another person toileting, which includes using toliet, bedpan, or urinal?: None Help from another person bathing (including washing, rinsing, drying)?: None Help from another person to put on and taking  off regular upper body clothing?: None Help from another person to put on and taking off regular lower body clothing?: None 6 Click Score: 24   End of Session Nurse Communication: Mobility status  Activity Tolerance: Patient tolerated treatment well Patient left: in bed;with call bell/phone within reach;with bed alarm set  OT Visit Diagnosis: Muscle weakness (generalized) (M62.81)                Time: 4680-3212 OT Time Calculation (min): 20 min Charges:  OT General Charges $OT Visit: 1 Visit OT Evaluation $OT Eval Low Complexity: 1 Low  Reeve Turnley, OTR/L [place]  Office [phone] Pager: [place] 08/25/2020, 4:56 PM

## 2020-08-25 NOTE — Assessment & Plan Note (Addendum)
-  Likely multifactorial in setting of underlying cirrhosis as well as probable prerenal component on admission - no further treatment; goal will now be pursuing comfort care

## 2020-08-25 NOTE — Assessment & Plan Note (Signed)
-  Continue Synthroid; history of abnormal TSH -Last free T4 1.47 (normal) on 07/27/2020

## 2020-08-25 NOTE — Progress Notes (Signed)
Patient ID: Stephen Grimes, male   DOB: [birth year], 70 y.o.   MRN: [id number] S: no new complaints.  Discussion with palliative care noted. O:BP 112/63 (BP Location: Left Arm)   Pulse 89   Temp 98.4 F (36.9 C) (Oral)   Resp (!) 21   Ht 5\' 7"  (1.702 m)   Wt 59.6 kg   SpO2 100%   BMI 20.58 kg/m   Intake/Output Summary (Last 24 hours) at 08/25/2020 1323 Last data filed at 08/25/2020 0544 Gross per 24 hour  Intake 240 ml  Output 300 ml  Net -60 ml   Intake/Output: I/O last 3 completed shifts: In: 1553.5 [P.O.:840; I.V.:713.5] Out: 300 [Urine:300]  Intake/Output this shift:  No intake/output data recorded. Weight change:  Gen: NAD CVS: RRR Resp: cta Abd: distended, +BS, soft, NT Ext: no edema  Recent Labs  Lab 08/07/2020 1733 08/14/2020 2053 08/17/2020 2338 08/28/2020 0338 08/30/2020 0505 08/28/2020 0619 09/19/2020 1155 08/24/20 0447 08/25/20 0408  NA 130* 130*  --   --  131*  --   --  131* 133*  K 6.5* 6.1* 5.6* 5.3* 5.3* 4.9 5.0 4.6 3.9  CL 100 101  --   --  97*  --   --  98 100  CO2 15* 14*  --   --  20*  --   --  19* 19*  GLUCOSE 106* 113*  --   --  141*  --   --  116* 108*  BUN 105* 92*  --   --  84*  --   --  92* 88*  CREATININE 5.09* 5.02* 4.77*  --  4.01*  --   --  4.25* 4.25*  ALBUMIN 2.6*  --   --   --  2.3*  --   --  2.3* 2.1*  CALCIUM 8.7* 8.6*  --   --  8.3*  --   --  8.3* 8.1*  PHOS  --   --   --   --   --   --   --  6.8* 7.1*  AST 73*  --   --   --  57*  --   --   --   --   ALT 22  --   --   --  24  --   --   --   --    Liver Function Tests: Recent Labs  Lab 08/12/2020 1733 09/06/2020 0505 08/24/20 0447 08/25/20 0408  AST 73* 57*  --   --   ALT 22 24  --   --   ALKPHOS 150* 137*  --   --   BILITOT 0.7 0.7  --   --   PROT 6.9 6.1*  --   --   ALBUMIN 2.6* 2.3* 2.3* 2.1*   No results for input(s): LIPASE, AMYLASE in the last 168 hours. No results for input(s): AMMONIA in the last 168 hours. CBC: Recent Labs  Lab 08/16/2020 1733 09/16/2020 0505 08/24/20 0447  08/25/20 0408  WBC 12.9* 11.2* 11.9* 13.1*  HGB 7.4* 6.5* 8.8* 8.4*  HCT 23.2* 19.6* 26.8* 25.5*  MCV 85.9 83.4 84.8 85.6  PLT 436* 354 339 352   Cardiac Enzymes: No results for input(s): CKTOTAL, CKMB, CKMBINDEX, TROPONINI in the last 168 hours. CBG: Recent Labs  Lab 08/07/2020 2050  GLUCAP 104*    Iron Studies: No results for input(s): IRON, TIBC, TRANSFERRIN, FERRITIN in the last 72 hours. Studies/Results: No results found. Marland Kitchen aspirin  81 mg Oral Q  breakfast  . heparin  5,000 Units Subcutaneous Q8H  . levothyroxine  100 mcg Oral QAC breakfast  . pantoprazole  40 mg Oral Daily  . thiamine  100 mg Oral Daily    BMET    Component Value Date/Time   NA 133 (L) 08/25/2020 0408   K 3.9 08/25/2020 0408   CL 100 08/25/2020 0408   CO2 19 (L) 08/25/2020 0408   GLUCOSE 108 (H) 08/25/2020 0408   BUN 88 (H) 08/25/2020 0408   CREATININE 4.25 (H) 08/25/2020 0408   CALCIUM 8.1 (L) 08/25/2020 0408   GFRNONAA 14 (L) 08/25/2020 0408   GFRAA >60 11/22/2018 0518   CBC    Component Value Date/Time   WBC 13.1 (H) 08/25/2020 0408   RBC 2.98 (L) 08/25/2020 0408   HGB 8.4 (L) 08/25/2020 0408   HGB 13.8 12/11/2011 1531   HCT 25.5 (L) 08/25/2020 0408   HCT 32.0 (L) 09/16/2018 0635   HCT 40.3 12/11/2011 1531   PLT 352 08/25/2020 0408   PLT 64 (L) 12/11/2011 1531   MCV 85.6 08/25/2020 0408   MCV 99.5 (H) 12/11/2011 1531   MCH 28.2 08/25/2020 0408   MCHC 32.9 08/25/2020 0408   RDW 18.4 (H) 08/25/2020 0408   RDW 13.3 12/11/2011 1531   LYMPHSABS 1.4 11/16/2018 1241   LYMPHSABS 1.5 12/11/2011 1531   MONOABS 0.7 11/16/2018 1241   MONOABS 0.6 12/11/2011 1531   EOSABS 0.0 11/16/2018 1241   EOSABS 0.0 12/11/2011 1531   BASOSABS 0.0 11/16/2018 1241   BASOSABS 0.0 12/11/2011 1531    Assessment/Plan: 1. AKI/CKD stage IV- is normally followed by Nephrology at [facility] (Dr. Smith Mince) who has noted that his baseline Scr has been climbing over the past year from 1.6-1.9 up to 2-2.5 prior to his  hospitalization.  The AKI/CKD stage IV is presumably due to ischemic ATN in setting of poor po intake and concomitant ARB and diuretic use. BUN/Cr improved initially after holding ARB and diuretics and giving IVF's however it has reached a plateau around 4.25. 1. Will continue IVF's given poor po intake (although no input recorded for IVF's overnight) 2. Would not resume ARB due to risks outweighing benefits 3. No indication for dialysis at this time and will continue to follow.  4. Appreciate palliative care consult to help set goals/limits of care as I do not think he would do well with HD.  I did discuss HD with him and what to expect, mainly worsening functional status and fatigue.  He appears a little depressed about his current health issues. 2. Metastatic renal cell carcinoma- failing current chemo and was taken off 08/15/20 with plan to try axitinib, however now with worsening renal function. Palliative care is following patient. 3. Cirrhosis 4. Hyperkalemia- improved with IVF's and holding ARB 5. Anemia of malignancy/chemo- transfuse prn, no ESA 6. Moderate protein malnutrition- with cirrhosis and metastatic cancer 7. HTN- stable 8. Hyponatremia- multifactorial, edema, AKI. Improving with IVF's, cont to follow. 9. Disposition- pt with metastatic renal cell carcinoma with progressive disease despite ongoing chemo. Consider palliative care consult to help set goals/limits of care.    Donetta Potts, MD [employer] [phone]

## 2020-08-25 NOTE — Assessment & Plan Note (Addendum)
-  continue comfort care

## 2020-08-25 NOTE — Assessment & Plan Note (Addendum)
-  now encephalopathic as of 2/6, partly due to renal failure

## 2020-08-25 NOTE — Assessment & Plan Note (Addendum)
-   Patient's BMI is Body mass index is 20.58 kg/m.. - Patient has the following signs/symptoms consistent with PCM: (fat loss, muscle loss, muscle wasting, cachexia). - consult placed for RD as well - too encephalopathic now to take in nutrition

## 2020-08-25 NOTE — Assessment & Plan Note (Addendum)
-   follows with oncology at [facility]; note reviewed in care everywhere -Appears to have overall poor prognosis given advanced malignancy and failure of previous tried treatment plans -On admission, patient had initially wished to pursue outpatient treatments however he continues to decline and has had further discussions with palliative care.   - given renal failure, patient unfortunately continues to decline and is now approaching end of life

## 2020-08-25 NOTE — Progress Notes (Signed)
PROGRESS NOTE    MATT DELPIZZO   CBU:[id number]  DOB: 07-26-1950  DOA: 07/25/2020     3  PCP: Seward Carol, MD  CC: Sent to the hospital for abnormal lab work  Hospital Course: Stephen Grimes is a 70 y.o. male with medical history significant of right renal cell carcinoma status post right nephrectomy 6 years ago, liver cirrhosis, coronary artery disease, essential hypertension, previous history of of alcoholism, hypothyroidism and COPD who has been doing well after his nephrectomy and chemotherapy for his renal cell carcinoma , was told that he was failing chemotherapy, was being evaluated for EGD and echocardiogram in preparation for new chemo, and his labs revealed creatinine of 4.  He was sent to ED for further evaluation.  On arrival to ED he was found to have a creatinine of 5 and hyperkalemic with a potassium of 6.5 He was referred to [facility] for admission .  Nephrology consulted for further recommendations.  Ultrasound of the renal did not show any obstructive pathology. Given his multiple comorbidities with underlying malignancy, palliative care was consulted.  He expressed wishes to remain full code with full measures for treatment.   Interval History:  No events overnight.  Patient laying in bed in no distress.  We reviewed history of his malignancy as well as current events especially centered around renal function.  He also reiterated wishes to remain full code and that he would want to be aggressive in his treatment even if it meant a decreased quality of life which he has been expecting for quite some time.  We also discussed that he may not be a hemodialysis candidate and if renal function worsens, that may mean transitioning to palliative care approach; he does understand this as well.  If he were able to need dialysis and be offered it, he states that he would still want to think about it some before hand.  He also had many questions centered around what dialysis truly  entails; gave him a broad overview but stated we could discuss in further detail if this became an actual reality. He continues to plan on new chemo with [facility] upon discharge if he is still a candidate as well.  Old records reviewed in assessment of this patient  ROS: Constitutional: negative for chills and fevers, Respiratory: negative for cough, Cardiovascular: negative for chest pain and Gastrointestinal: negative for abdominal pain  Assessment & Plan: * Acute renal failure superimposed on stage 3b chronic kidney disease ([place]) - patient has history of CKD3b. Baseline creat ~ 2.2 - 2.4, eGFR 33 - patient presents with increase in creat >0.3 mg/dL above baseline, creat increase >1.5x baseline presumed to have occurred within past 7 days PTA - nephrology following; patient considered poor dialysis candidate at this time -No obstructive etiology noted on renal ultrasound -S/p bicarb drip on admission, discontinued after improvement of acidosis -Continue IV fluids -GOC discussion held bedside on 08/25/2020.  Patient still getting thought as to whether he would want dialysis if it was even offered; otherwise, understands if worsening renal function and not a dialysis candidate, further discussions will be held regarding transitioning to a palliative care/hospice approach; however, at this time he wishes to remain fully aggressive   Malignant neoplasm of unspecified kidney, except renal pelvis [facility]) Oncology History Overview Note  Previous Treatments: Sunitnib (07/27/2016-12/03/2016) Nivolumab+Ipilimumab (2 cycles)/ Nivo via OMNIVORE clinical trial (01/21/2017-05/29/2017) Cabozantinib (07/08/2017-07/22/2018) Lenvatinib +everolimus (07/24/2018-11/17/2018) Everolimus 35m (02/16/2019- D/Ced 07/27/2020)  - [facility] note reviewed  in care everywhere -Appears to have overall poor prognosis given advanced malignancy and failure of previous tried treatment plans -However, patient still wishes  to pursue upcoming tentative plans for a different chemotherapy if he is still a candidate as renal function at this time may be precluding treatment which we discussed bedside -Recently was taken off of monotherapy everolimus on 07/27/2020 -Palliative care met with patient and patient was uninterested in much discussion regarding [place]; remains full code/full scope  Irregular cardiac rhythm -Patient does have history of Takotsubo cardiomyopathy however no noted history of A. fib. -Obtain EKG  Hyperkalemia - 2/2 renal failure - resolved with Lokelma; can d/c now  Protein calorie malnutrition ([place]) - - Patient's BMI is Body mass index is 20.58 kg/m.. - Patient has the following signs/symptoms consistent with PCM: (fat loss, muscle loss, muscle wasting, cachexia). - consult placed for RD as well - continue diet   Alcoholic cirrhosis of liver without ascites ([place]) -Mentation remains normal; no signs of encephalopathy -Denies any nausea/vomiting  Normocytic anemia -Presumed anemia of chronic disease due to underlying CKD, renal cell carcinoma and right nephrectomy -Hemoglobin 6.5 g/dL on admission, given 1 unit PRBC -Continue monitoring CBC daily  COPD (chronic obstructive pulmonary disease) (HCC) -No signs or symptoms of exacerbation at this time -Continue supportive care  History of cardiomyopathy -Follows at [facility].  Recently underwent repeat echo on 07/28/2020 -EF 55 to 16%, no diastolic dysfunction noted.  LV is normal size, no LVH, no pulmonary hypertension appreciated on echo  Hyponatremia -Considered prerenal for now on admission -Continue fluids and trend BMP  Hypothyroidism, unspecified -Continue Synthroid; history of abnormal TSH -Last free T4 1.47 (normal) on 07/27/2020  Essential (primary) hypertension -Blood pressure controlled at this time without need for medication; on low end of normal actually -will continue monitoring for any need for  medications   Antimicrobials:   DVT prophylaxis: heparin injection 5,000 Units Start: 08/09/2020 2345   Code Status:   Code Status: Full Code Family Communication: none present  Disposition Plan: Status is: Inpatient  Remains inpatient appropriate because:Ongoing diagnostic testing needed not appropriate for outpatient work up, Unsafe d/c plan, IV treatments appropriate due to intensity of illness or inability to take PO and Inpatient level of care appropriate due to severity of illness   Dispo: The patient is from: Home              Anticipated d/c is to: Home              Anticipated d/c date is: > 3 days              Patient currently is not medically stable to d/c.   Difficult to place patient No      Risk of unplanned readmission score: Unplanned Admission- Pilot do not use: 22.12   Objective: Blood pressure 112/63, pulse 89, temperature 98.4 F (36.9 C), temperature source Oral, resp. rate (!) 21, height '5\' 7"'  (1.702 m), weight 59.6 kg, SpO2 100 %.  Examination: General appearance: Pleasant but chronically ill-appearing elderly male resting in bed in no obvious distress Head: Normocephalic, without obvious abnormality, atraumatic Eyes: EOMI Lungs: clear to auscultation bilaterally Heart: irregularly irregular rhythm and S1, S2 normal Abdomen: normal findings: bowel sounds normal and soft, non-tender Extremities: thin, trace LLE edema Skin: mobility and turgor normal Neurologic: Grossly normal  Consultants:   Nephrology  PCM  Procedures:     Data Reviewed: I have personally reviewed following labs and imaging studies Results for  orders placed or performed during the hospital encounter of 08/10/2020 (from the past 24 hour(s))  Renal function panel     Status: Abnormal   Collection Time: 08/25/20  4:08 AM  Result Value Ref Range   Sodium 133 (L) 135 - 145 mmol/L   Potassium 3.9 3.5 - 5.1 mmol/L   Chloride 100 98 - 111 mmol/L   CO2 19 (L) 22 - 32 mmol/L    Glucose, Bld 108 (H) 70 - 99 mg/dL   BUN 88 (H) 8 - 23 mg/dL   Creatinine, Ser 4.25 (H) 0.61 - 1.24 mg/dL   Calcium 8.1 (L) 8.9 - 10.3 mg/dL   Phosphorus 7.1 (H) 2.5 - 4.6 mg/dL   Albumin 2.1 (L) 3.5 - 5.0 g/dL   GFR, Estimated 14 (L) >60 mL/min   Anion gap 14 5 - 15  CBC     Status: Abnormal   Collection Time: 08/25/20  4:08 AM  Result Value Ref Range   WBC 13.1 (H) 4.0 - 10.5 K/uL   RBC 2.98 (L) 4.22 - 5.81 MIL/uL   Hemoglobin 8.4 (L) 13.0 - 17.0 g/dL   HCT 25.5 (L) 39.0 - 52.0 %   MCV 85.6 80.0 - 100.0 fL   MCH 28.2 26.0 - 34.0 pg   MCHC 32.9 30.0 - 36.0 g/dL   RDW 18.4 (H) 11.5 - 15.5 %   Platelets 352 150 - 400 K/uL   nRBC 0.0 0.0 - 0.2 %    Recent Results (from the past 240 hour(s))  SARS CORONAVIRUS 2 (TAT 6-24 HRS) Nasopharyngeal Nasopharyngeal Swab     Status: None   Collection Time: 08/19/20 12:44 PM   Specimen: Nasopharyngeal Swab  Result Value Ref Range Status   SARS Coronavirus 2 NEGATIVE NEGATIVE Final    Comment: (NOTE) SARS-CoV-2 target nucleic acids are NOT DETECTED.  The SARS-CoV-2 RNA is generally detectable in upper and lower respiratory specimens during the acute phase of infection. Negative results do not preclude SARS-CoV-2 infection, do not rule out co-infections with other pathogens, and should not be used as the sole basis for treatment or other patient management decisions. Negative results must be combined with clinical observations, patient history, and epidemiological information. The expected result is Negative.  Fact Sheet for Patients: SugarRoll.be  Fact Sheet for Healthcare Providers: https://www.woods-mathews.com/  This test is not yet approved or cleared by the [place] FDA and  has been authorized for detection and/or diagnosis of SARS-CoV-2 by FDA under an Emergency Use Authorization (EUA). This EUA will remain  in effect (meaning this test can be used) for the duration of the COVID-19  declaration under Se ction 564(b)(1) of the Act, 21 U.S.C. section 360bbb-3(b)(1), unless the authorization is terminated or revoked sooner.  Performed at [facility], [place] [address] [place], [place] [address]   SARS CORONAVIRUS 2 (TAT 6-24 HRS) Nasopharyngeal Nasopharyngeal Swab     Status: None   Collection Time: 09/13/2020  4:36 PM   Specimen: Nasopharyngeal Swab  Result Value Ref Range Status   SARS Coronavirus 2 NEGATIVE NEGATIVE Final    Comment: (NOTE) SARS-CoV-2 target nucleic acids are NOT DETECTED.  The SARS-CoV-2 RNA is generally detectable in upper and lower respiratory specimens during the acute phase of infection. Negative results do not preclude SARS-CoV-2 infection, do not rule out co-infections with other pathogens, and should not be used as the sole basis for treatment or other patient management decisions. Negative results must be combined with clinical observations, patient history, and  epidemiological information. The expected result is Negative.  Fact Sheet for Patients: SugarRoll.be  Fact Sheet for Healthcare Providers: https://www.woods-mathews.com/  This test is not yet approved or cleared by the [place] FDA and  has been authorized for detection and/or diagnosis of SARS-CoV-2 by FDA under an Emergency Use Authorization (EUA). This EUA will remain  in effect (meaning this test can be used) for the duration of the COVID-19 declaration under Se ction 564(b)(1) of the Act, 21 U.S.C. section 360bbb-3(b)(1), unless the authorization is terminated or revoked sooner.  Performed at [facility], [place] [address], [place], [place] [address]      Radiology Studies: No results found. US Renal  Final Result      Scheduled Meds: . aspirin  81 mg Oral Q breakfast  . heparin  5,000 Units Subcutaneous Q8H  . levothyroxine  100 mcg Oral QAC breakfast  . pantoprazole  40 mg Oral Daily  . thiamine   100 mg Oral Daily   PRN Meds: acetaminophen **OR** acetaminophen, calcium carbonate (dosed in mg elemental calcium), camphor-menthol **AND** hydrOXYzine, docusate sodium, feeding supplement (NEPRO CARB STEADY), ondansetron **OR** ondansetron (ZOFRAN) IV, sorbitol, triamcinolone, zolpidem Continuous Infusions: . sodium chloride 75 mL/hr at 08/25/20 1018     LOS: 3 days  Time spent: Greater than 50% of the 35 minute visit was spent in counseling/coordination of care for the patient as laid out in the A&P.   Dwyane Dee, MD [facility] 08/25/2020, 2:41 PM

## 2020-08-25 NOTE — Assessment & Plan Note (Signed)
-  Follows at [facility].  Recently underwent repeat echo on 07/28/2020 -EF 55 to 24%, no diastolic dysfunction noted.  LV is normal size, no LVH, no pulmonary hypertension appreciated on echo

## 2020-08-25 NOTE — Assessment & Plan Note (Signed)
-   2/2 renal failure - resolved with Lokelma; can d/c now

## 2020-08-25 NOTE — Assessment & Plan Note (Addendum)
-  tolerating elevation in HR at this time; goal is comfort

## 2020-08-26 DIAGNOSIS — N179 Acute kidney failure, unspecified: Secondary | ICD-10-CM | POA: Diagnosis not present

## 2020-08-26 DIAGNOSIS — N1832 Chronic kidney disease, stage 3b: Secondary | ICD-10-CM | POA: Diagnosis not present

## 2020-08-26 LAB — CBC WITH DIFFERENTIAL/PLATELET
Abs Immature Granulocytes: 0.15 10*3/uL — ABNORMAL HIGH (ref 0.00–0.07)
Basophils Absolute: 0 10*3/uL (ref 0.0–0.1)
Basophils Relative: 0 %
Eosinophils Absolute: 0 10*3/uL (ref 0.0–0.5)
Eosinophils Relative: 0 %
HCT: 23.6 % — ABNORMAL LOW (ref 39.0–52.0)
Hemoglobin: 7.7 g/dL — ABNORMAL LOW (ref 13.0–17.0)
Immature Granulocytes: 1 %
Lymphocytes Relative: 7 %
Lymphs Abs: 0.9 10*3/uL (ref 0.7–4.0)
MCH: 28 pg (ref 26.0–34.0)
MCHC: 32.6 g/dL (ref 30.0–36.0)
MCV: 85.8 fL (ref 80.0–100.0)
Monocytes Absolute: 1.9 10*3/uL — ABNORMAL HIGH (ref 0.1–1.0)
Monocytes Relative: 15 %
Neutro Abs: 10 10*3/uL — ABNORMAL HIGH (ref 1.7–7.7)
Neutrophils Relative %: 77 %
Platelets: 334 10*3/uL (ref 150–400)
RBC: 2.75 MIL/uL — ABNORMAL LOW (ref 4.22–5.81)
RDW: 18.4 % — ABNORMAL HIGH (ref 11.5–15.5)
WBC: 12.9 10*3/uL — ABNORMAL HIGH (ref 4.0–10.5)
nRBC: 0.2 % (ref 0.0–0.2)

## 2020-08-26 LAB — MAGNESIUM: Magnesium: 2.7 mg/dL — ABNORMAL HIGH (ref 1.7–2.4)

## 2020-08-26 LAB — GLUCOSE, CAPILLARY
Glucose-Capillary: 100 mg/dL — ABNORMAL HIGH (ref 70–99)
Glucose-Capillary: 97 mg/dL (ref 70–99)
Glucose-Capillary: 101 mg/dL — ABNORMAL HIGH (ref 70–99)

## 2020-08-26 LAB — RENAL FUNCTION PANEL
Albumin: 2.1 g/dL — ABNORMAL LOW (ref 3.5–5.0)
Anion gap: 14 (ref 5–15)
BUN: 97 mg/dL — ABNORMAL HIGH (ref 8–23)
CO2: 18 mmol/L — ABNORMAL LOW (ref 22–32)
Calcium: 8.1 mg/dL — ABNORMAL LOW (ref 8.9–10.3)
Chloride: 101 mmol/L (ref 98–111)
Creatinine, Ser: 4.24 mg/dL — ABNORMAL HIGH (ref 0.61–1.24)
GFR, Estimated: 14 mL/min — ABNORMAL LOW (ref >60)
Glucose, Bld: 113 mg/dL — ABNORMAL HIGH (ref 70–99)
Phosphorus: 6.4 mg/dL — ABNORMAL HIGH (ref 2.5–4.6)
Potassium: 3.7 mmol/L (ref 3.5–5.1)
Sodium: 133 mmol/L — ABNORMAL LOW (ref 135–145)

## 2020-08-26 MED ORDER — SODIUM BICARBONATE 8.4 % IV SOLN
INTRAVENOUS | Status: DC
  Filled 2020-08-26: qty 150

## 2020-08-26 MED ORDER — PROSOURCE PLUS PO LIQD
30.0000 mL | Freq: Two times a day (BID) | ORAL | Status: DC
  Administered 2020-08-26 – 2020-08-27 (×3): 30 mL via ORAL
  Filled 2020-08-26 (×3): qty 30

## 2020-08-26 MED ORDER — SEVELAMER CARBONATE 800 MG PO TABS
800.0000 mg | ORAL_TABLET | Freq: Three times a day (TID) | ORAL | Status: DC
  Administered 2020-08-26 – 2020-08-27 (×5): 800 mg via ORAL
  Filled 2020-08-26 (×8): qty 1

## 2020-08-26 NOTE — Progress Notes (Signed)
Nephrology Progress Note  Patient ID: Stephen Grimes, male   DOB: September 08, 1950, 70 y.o.   MRN: [id number]  S: he had 670 mL UOP documented over 2/3. Has been on bicarb gtt at 125 ml/hr.  He's a little short of breath and has just gotten back into bed.  He states that he would still want to pursue HD if needed  Review of systems:  Eating more in the past day or two  Reports some Shortness of breath ; no cp Denies n/v currently    O:BP (!) 112/47 (BP Location: Left Arm)   Pulse 99   Temp 98.6 F (37 C) (Oral)   Resp 20   Ht 5\' 7"  (1.702 m)   Wt 66.8 kg   SpO2 98%   BMI 23.05 kg/m   Intake/Output Summary (Last 24 hours) at 08/26/2020 1103 Last data filed at 08/26/2020 1053 Gross per 24 hour  Intake 2078 ml  Output 670 ml  Net 1408 ml   Intake/Output: I/O last 3 completed shifts: In: 2553 [P.O.:1015; I.V.:1538] Out: 670 [Urine:670]  Intake/Output this shift:  Total I/O In: 120 [P.O.:120] Out: -  Weight change:   General adult male in bed in no acute distress; thin and chronically ill-appearing HEENT normocephalic atraumatic extraocular movements intact  Neck supple trachea midline Lungs clear to auscultation bilaterally normal work of breathing at rest - increased with exertion Heart S1S2 no rub Abdomen distended and nontender. + bowel sounds Soft. Has large hernia.  Extremities no edema  Psych normal mood and affect Neuro alert and oriented and conversant, follows commands  Recent Labs  Lab 08/15/2020 1733 08/06/2020 2053 08/06/2020 2338 09/01/2020 0338 09/13/2020 0505 09/08/2020 0619 09/03/2020 1155 08/24/20 0447 08/25/20 0408 08/26/20 0403  NA 130* 130*  --   --  131*  --   --  131* 133* 133*  K 6.5* 6.1* 5.6* 5.3* 5.3* 4.9 5.0 4.6 3.9 3.7  CL 100 101  --   --  97*  --   --  98 100 101  CO2 15* 14*  --   --  20*  --   --  19* 19* 18*  GLUCOSE 106* 113*  --   --  141*  --   --  116* 108* 113*  BUN 105* 92*  --   --  84*  --   --  92* 88* 97*  CREATININE 5.09* 5.02* 4.77*   --  4.01*  --   --  4.25* 4.25* 4.24*  ALBUMIN 2.6*  --   --   --  2.3*  --   --  2.3* 2.1* 2.1*  CALCIUM 8.7* 8.6*  --   --  8.3*  --   --  8.3* 8.1* 8.1*  PHOS  --   --   --   --   --   --   --  6.8* 7.1* 6.4*  AST 73*  --   --   --  57*  --   --   --   --   --   ALT 22  --   --   --  24  --   --   --   --   --    Liver Function Tests: Recent Labs  Lab 08/08/2020 1733 08/31/2020 0505 08/24/20 0447 08/25/20 0408 08/26/20 0403  AST 73* 57*  --   --   --   ALT 22 24  --   --   --   Arabella Merles  150* 137*  --   --   --   BILITOT 0.7 0.7  --   --   --   PROT 6.9 6.1*  --   --   --   ALBUMIN 2.6* 2.3* 2.3* 2.1* 2.1*   No results for input(s): LIPASE, AMYLASE in the last 168 hours. No results for input(s): AMMONIA in the last 168 hours. CBC: Recent Labs  Lab 08/01/2020 1733 09/02/2020 0505 08/24/20 0447 08/25/20 0408 08/26/20 0403  WBC 12.9* 11.2* 11.9* 13.1* 12.9*  NEUTROABS  --   --   --   --  10.0*  HGB 7.4* 6.5* 8.8* 8.4* 7.7*  HCT 23.2* 19.6* 26.8* 25.5* 23.6*  MCV 85.9 83.4 84.8 85.6 85.8  PLT 436* 354 339 352 334   Cardiac Enzymes: No results for input(s): CKTOTAL, CKMB, CKMBINDEX, TROPONINI in the last 168 hours. CBG: Recent Labs  Lab 08/01/2020 2050 08/26/20 0729  GLUCAP 104* 100*    Iron Studies: No results for input(s): IRON, TIBC, TRANSFERRIN, FERRITIN in the last 72 hours. Studies/Results: No results found. Marland Kitchen aspirin  81 mg Oral Q breakfast  . heparin  5,000 Units Subcutaneous Q8H  . levothyroxine  100 mcg Oral QAC breakfast  . pantoprazole  40 mg Oral Daily  . thiamine  100 mg Oral Daily    BMET    Component Value Date/Time   NA 133 (L) 08/26/2020 0403   K 3.7 08/26/2020 0403   CL 101 08/26/2020 0403   CO2 18 (L) 08/26/2020 0403   GLUCOSE 113 (H) 08/26/2020 0403   BUN 97 (H) 08/26/2020 0403   CREATININE 4.24 (H) 08/26/2020 0403   CALCIUM 8.1 (L) 08/26/2020 0403   GFRNONAA 14 (L) 08/26/2020 0403   GFRAA >60 11/22/2018 0518   CBC    Component Value  Date/Time   WBC 12.9 (H) 08/26/2020 0403   RBC 2.75 (L) 08/26/2020 0403   HGB 7.7 (L) 08/26/2020 0403   HGB 13.8 12/11/2011 1531   HCT 23.6 (L) 08/26/2020 0403   HCT 32.0 (L) 09/16/2018 0635   HCT 40.3 12/11/2011 1531   PLT 334 08/26/2020 0403   PLT 64 (L) 12/11/2011 1531   MCV 85.8 08/26/2020 0403   MCV 99.5 (H) 12/11/2011 1531   MCH 28.0 08/26/2020 0403   MCHC 32.6 08/26/2020 0403   RDW 18.4 (H) 08/26/2020 0403   RDW 13.3 12/11/2011 1531   LYMPHSABS 0.9 08/26/2020 0403   LYMPHSABS 1.5 12/11/2011 1531   MONOABS 1.9 (H) 08/26/2020 0403   MONOABS 0.6 12/11/2011 1531   EOSABS 0.0 08/26/2020 0403   EOSABS 0.0 12/11/2011 1531   BASOSABS 0.0 08/26/2020 0403   BASOSABS 0.0 12/11/2011 1531    Assessment/Plan: 1. AKI/CKD stage IV- is normally followed by Nephrology at [facility] (Dr. Smith Mince) who has noted that his baseline Scr has been climbing over the past year from 1.6-1.9 up to 2-2.5 prior to his hospitalization.  The AKI/CKD stage IV is presumably due to ischemic ATN in setting of poor po intake and concomitant ARB and diuretic use. BUN/Cr improved initially after holding ARB and diuretics and giving IVF's however it has reached a plateau around 4.25. 1. Discontinue sodium bicarb gtt  2. Would not resume ARB due to risks outweighing benefits 3. No indication for dialysis at this time and will continue to follow.  4. Appreciate palliative care consult to help set goals/limits of care as we do not think he would do well with HD.  Nephrology has discussed HD with him  and what to expect, mainly worsening functional status and fatigue.   2. Metastatic renal cell carcinoma- failing current chemo and was taken off 08/15/20 with plan to try axitinib, however now with worsening renal function. Palliative care is following patient. 3. Cirrhosis - noted  4. Hyperkalemia- resolved with IVF's and holding ARB  5. Anemia of malignancy/chemo- transfuse prn, no ESA 6. Moderate protein malnutrition- with  cirrhosis and metastatic cancer 7. HTN- controlled  8. Hyponatremia- multifactorial, edema, AKI. Improving with IVF's 9. Metabolic bone disease - start renvela with meals; on renal diet 10. Disposition- pt with metastatic renal cell carcinoma with progressive disease despite ongoing chemo. palliative care has been consulted  Claudia Desanctis, MD 08/26/2020  11:25 AM

## 2020-08-26 NOTE — Progress Notes (Signed)
Initial Nutrition Assessment  RD working remotely.  DOCUMENTATION CODES:   Not applicable  INTERVENTION:  - will order 30 ml Prosource Plus BID, each supplement provides 100 kcal and 15 grams protein.  - continue Nepro Shake po TID PRN, each supplement provides 425 kcal and 19 grams protein. - complete NFPE at follow-up. - will monitor for need for diet education.   NUTRITION DIAGNOSIS:   Increased nutrient needs related to acute illness,catabolic illness,cancer and cancer related treatments as evidenced by estimated needs.  GOAL:   Patient will meet greater than or equal to 90% of their needs  MONITOR:   PO intake,Supplement acceptance,Labs,Weight trends,I & O's  REASON FOR ASSESSMENT:   Consult Assessment of nutrition requirement/status  ASSESSMENT:   70 y.o. male with medical history of R renal cell carcinoma s/p R nephrectomy 6 years ago, liver cirrhosis, CAD, HTN, previous history of alcohol abuse, hypothyroidism, and COPD. Outpatient he was to have EGD and echocardiogram in preparation for new round of chemo. Labs revealed creatinine of 4 mg/dl so he was sent the ED for further evaluation. In the ED, creatinine was 5 mg/dl and K was 6.5 mmol/l. Nephrology and Palliative Care were consulted. Renal ultrasound did not show any obstructive pathology.  The only meal intakes documented since admission were 100% of lunch on 2/2 and 75% of dinner on 2/4. Nepro Shake was ordered TID PRN on 1/31 and no documentation of patient receiving this supplement at any time.  MST score of 0. He has not been seen by a [place] RD since 11/20/18.   Weight today is 147 lb, weight on 2/2 was 131 lb, and weight on 1/31 was 132 lb. PTA, the most recently documented weight was on 03/25/19 when he weighed 140 lb.   No information documented in the edema section of flow sheet.   Per notes: - ARF on stage 3 CKD - malignant neoplasm of kidney s/p several rounds of chemo and immunotherapy--follows  at [facility] - poor prognosis--remains Full Code - PCM with noted fat and muscle wasting - alcoholic cirrhosis without ascites   Labs reviewed; CBGs: 100 and 97 mg/dl, Na: 133 mmol/l, BUN: 97 mg/dl, creatinine: 4.24 mg/dl, Ca: 8.1 mg/dl, Phos: 6.4 mg/dl, Mg: 2.7 mg/dl, GFR: 14 ml/min.  Medications reviewed; 100 mcg oral synthroid/day, 40 mg oral protonix/day, 800 mg renvela TID, 100 mg oral thiamine/day.     NUTRITION - FOCUSED PHYSICAL EXAM:  unable to complete at this time.   Diet Order:   Diet Order            Diet renal with fluid restriction Fluid restriction: 1500 mL Fluid; Room service appropriate? Yes; Fluid consistency: Thin  Diet effective now                 EDUCATION NEEDS:   No education needs have been identified at this time  Skin:  Skin Assessment: Reviewed RN Assessment  Last BM:  2/2 (type 6)  Height:   Ht Readings from Last 1 Encounters:  07/23/2020 5\' 7"  (1.702 m)    Weight:   Wt Readings from Last 1 Encounters:  08/26/20 66.8 kg    Estimated Nutritional Needs:  Kcal:  2000-2250 kcal Protein:  100-115 grams Fluid:  >/= 2.2 L/day      Jarome Matin, MS, RD, LDN, CNSC Inpatient Clinical Dietitian RD pager # available in [facility]  After hours/weekend pager # available in [facility]

## 2020-08-26 NOTE — Plan of Care (Signed)
  Problem: Health Behavior/Discharge Planning: Goal: Ability to manage health-related needs will improve Outcome: Progressing   Problem: Clinical Measurements: Goal: Will remain free from infection Outcome: Progressing   Problem: Pain Managment: Goal: General experience of comfort will improve Outcome: Progressing   

## 2020-08-26 NOTE — Progress Notes (Signed)
PROGRESS NOTE    CHAYANNE SPEIR   DGL:[id number]  DOB: 05/03/1951  DOA: 08/07/2020     4  PCP: Seward Carol, MD  CC: Sent to the hospital for abnormal lab work  Hospital Course: DMAURI ROSENOW is a 70 y.o. male with medical history significant of right renal cell carcinoma status post right nephrectomy 6 years ago, liver cirrhosis, coronary artery disease, essential hypertension, previous history of of alcoholism, hypothyroidism and COPD who has been doing well after his nephrectomy and chemotherapy for his renal cell carcinoma , was told that he was failing chemotherapy, was being evaluated for EGD and echocardiogram in preparation for new chemo, and his labs revealed creatinine of 4.  He was sent to ED for further evaluation.  On arrival to ED he was found to have a creatinine of 5 and hyperkalemic with a potassium of 6.5 He was referred to [facility] for admission .  Nephrology consulted for further recommendations.  Ultrasound of the renal did not show any obstructive pathology. Given his multiple comorbidities with underlying malignancy, palliative care was consulted.  He expressed wishes to remain full code with full measures for treatment.   Interval History:  No events overnight.  Mostly lethargic when seen by me this morning.  Started on a bicarb drip after reviewing labs noting slightly worsening acidosis.  Later in the morning he felt more short of breath and nephrology recommended to discontinue bicarb drip. Hemoglobin starting to downtrend again also.  Old records reviewed in assessment of this patient  ROS: Constitutional: negative for chills and fevers, Respiratory: negative for cough, Cardiovascular: negative for chest pain and Gastrointestinal: negative for abdominal pain  Assessment & Plan: * Acute renal failure superimposed on stage 3b chronic kidney disease ([place]) - patient has history of CKD3b. Baseline creat ~ 2.2 - 2.4, eGFR 33 - patient presents with increase in  creat >0.3 mg/dL above baseline, creat increase >1.5x baseline presumed to have occurred within past 7 days PTA - nephrology following; patient considered poor dialysis candidate at this time -No obstructive etiology noted on renal ultrasound -GOC discussion held bedside on 08/25/2020.  Patient still giving thought as to whether he would want dialysis if it was even offered; otherwise, understands if worsening renal function and not a dialysis candidate, further discussions will be held regarding transitioning to a palliative care/hospice approach; however, at this time he wishes to remain fully aggressive -S/p bicarb drip on admission, discontinued after improvement of acidosis -Was continued on NS after bicarb discontinued however on 08/26/2020, his acidosis had slightly worsened along with increasing BUN.  He was reordered a bicarb infusion however became more short of breath; discussed with nephrology and IV fluids will be discontinued altogether for now and we will monitor renal response   Malignant neoplasm of unspecified kidney, except renal pelvis ([place]) Oncology History Overview Note  Previous Treatments: Sunitnib (07/27/2016-12/03/2016) Nivolumab+Ipilimumab (2 cycles)/ Nivo via OMNIVORE clinical trial (01/21/2017-05/29/2017) Cabozantinib (07/08/2017-07/22/2018) Lenvatinib +everolimus (07/24/2018-11/17/2018) Everolimus 49m (02/16/2019- D/Ced 07/27/2020)  - follows with oncology at [facility] note reviewed in care everywhere -Appears to have overall poor prognosis given advanced malignancy and failure of previous tried treatment plans -However, patient still wishes to pursue upcoming tentative plans for a different chemotherapy if he is still a candidate as renal function at this time may be precluding treatment which we discussed bedside -Recently was taken off of monotherapy everolimus on 07/27/2020 -Palliative care met with patient and patient was uninterested in much discussion regarding GLorain remains full  code/full scope  Hyperkalemia-resolved as of 08/26/2020 - 2/2 renal failure - resolved with Lokelma; can d/c now  Irregular cardiac rhythm -Patient does have history of Takotsubo cardiomyopathy however no noted history of A. Fib. -EKG obtained on 08/25/2020 and personally reviewed.  Sinus rhythm with significant sinus arrhythmia.  PR 152, QTc 490 -No further work-up at this time.  Findings discussed with patient  Protein calorie malnutrition ([place]) - - Patient's BMI is Body mass index is 20.58 kg/m.. - Patient has the following signs/symptoms consistent with PCM: (fat loss, muscle loss, muscle wasting, cachexia). - consult placed for RD as well - continue diet   Alcoholic cirrhosis of liver without ascites ([place]) -Mentation remains normal; no signs of encephalopathy -Denies any nausea/vomiting  Normocytic anemia -Presumed anemia of chronic disease due to underlying CKD, renal cell carcinoma and right nephrectomy -Hemoglobin 6.5 g/dL on admission, given 1 unit PRBC -Continue monitoring CBC daily  COPD (chronic obstructive pulmonary disease) (HCC) -No signs or symptoms of exacerbation at this time -Continue supportive care  History of cardiomyopathy -Follows at [facility].  Recently underwent repeat echo on 07/28/2020 -EF 55 to 59%, no diastolic dysfunction noted.  LV is normal size, no LVH, no pulmonary hypertension appreciated on echo  Hyponatremia -Likely multifactorial in setting of underlying cirrhosis as well as probable prerenal component on admission -Fluids have been discontinued for now -Continue trending BMP  Hypothyroidism, unspecified -Continue Synthroid; history of abnormal TSH -Last free T4 1.47 (normal) on 07/27/2020  Essential (primary) hypertension -Blood pressure controlled at this time without need for medication; on low end of normal actually -will continue monitoring for any need for medications   Antimicrobials:   DVT prophylaxis: heparin injection 5,000 Units  Start: 08/10/2020 2345   Code Status:   Code Status: Full Code Family Communication: none present  Disposition Plan: Status is: Inpatient  Remains inpatient appropriate because:Ongoing diagnostic testing needed not appropriate for outpatient work up, Unsafe d/c plan, IV treatments appropriate due to intensity of illness or inability to take PO and Inpatient level of care appropriate due to severity of illness   Dispo: The patient is from: Home              Anticipated d/c is to: Home              Anticipated d/c date is: > 3 days              Patient currently is not medically stable to d/c.   Difficult to place patient No  Risk of unplanned readmission score: Unplanned Admission- Pilot do not use: 24.88   Objective: Blood pressure (!) 112/47, pulse 99, temperature 98.6 F (37 C), temperature source Oral, resp. rate 20, height '5\' 7"'  (1.702 m), weight 66.8 kg, SpO2 98 %.  Examination: General appearance: Chronically ill-appearing and lethargic elderly gentleman sitting up in recliner in no obvious distress Head: Normocephalic, without obvious abnormality, atraumatic Eyes: EOMI Lungs: clear to auscultation bilaterally Heart: irregularly irregular rhythm and S1, S2 normal Abdomen: Large right lower abdominal irregularity palpated, mildly tender to palpation and is soft.  Bowel sounds present Extremities: thin, no edema noted Skin: mobility and turgor normal Neurologic: No focal deficits.  No asterixis.  No obvious encephalopathy.  Consultants:   Nephrology  PCM  Procedures:     Data Reviewed: I have personally reviewed following labs and imaging studies Results for orders placed or performed during the hospital encounter of 08/19/2020 (from the past 24 hour(s))  Renal function panel  Status: Abnormal   Collection Time: 08/26/20  4:03 AM  Result Value Ref Range   Sodium 133 (L) 135 - 145 mmol/L   Potassium 3.7 3.5 - 5.1 mmol/L   Chloride 101 98 - 111 mmol/L   CO2 18 (L)  22 - 32 mmol/L   Glucose, Bld 113 (H) 70 - 99 mg/dL   BUN 97 (H) 8 - 23 mg/dL   Creatinine, Ser 4.24 (H) 0.61 - 1.24 mg/dL   Calcium 8.1 (L) 8.9 - 10.3 mg/dL   Phosphorus 6.4 (H) 2.5 - 4.6 mg/dL   Albumin 2.1 (L) 3.5 - 5.0 g/dL   GFR, Estimated 14 (L) >60 mL/min   Anion gap 14 5 - 15  CBC with Differential/Platelet     Status: Abnormal   Collection Time: 08/26/20  4:03 AM  Result Value Ref Range   WBC 12.9 (H) 4.0 - 10.5 K/uL   RBC 2.75 (L) 4.22 - 5.81 MIL/uL   Hemoglobin 7.7 (L) 13.0 - 17.0 g/dL   HCT 23.6 (L) 39.0 - 52.0 %   MCV 85.8 80.0 - 100.0 fL   MCH 28.0 26.0 - 34.0 pg   MCHC 32.6 30.0 - 36.0 g/dL   RDW 18.4 (H) 11.5 - 15.5 %   Platelets 334 150 - 400 K/uL   nRBC 0.2 0.0 - 0.2 %   Neutrophils Relative % 77 %   Neutro Abs 10.0 (H) 1.7 - 7.7 K/uL   Lymphocytes Relative 7 %   Lymphs Abs 0.9 0.7 - 4.0 K/uL   Monocytes Relative 15 %   Monocytes Absolute 1.9 (H) 0.1 - 1.0 K/uL   Eosinophils Relative 0 %   Eosinophils Absolute 0.0 0.0 - 0.5 K/uL   Basophils Relative 0 %   Basophils Absolute 0.0 0.0 - 0.1 K/uL   Immature Granulocytes 1 %   Abs Immature Granulocytes 0.15 (H) 0.00 - 0.07 K/uL  Magnesium     Status: Abnormal   Collection Time: 08/26/20  4:03 AM  Result Value Ref Range   Magnesium 2.7 (H) 1.7 - 2.4 mg/dL  Glucose, capillary     Status: Abnormal   Collection Time: 08/26/20  7:29 AM  Result Value Ref Range   Glucose-Capillary 100 (H) 70 - 99 mg/dL  Glucose, capillary     Status: None   Collection Time: 08/26/20 11:01 AM  Result Value Ref Range   Glucose-Capillary 97 70 - 99 mg/dL    Recent Results (from the past 240 hour(s))  SARS CORONAVIRUS 2 (TAT 6-24 HRS) Nasopharyngeal Nasopharyngeal Swab     Status: None   Collection Time: 08/19/20 12:44 PM   Specimen: Nasopharyngeal Swab  Result Value Ref Range Status   SARS Coronavirus 2 NEGATIVE NEGATIVE Final    Comment: (NOTE) SARS-CoV-2 target nucleic acids are NOT DETECTED.  The SARS-CoV-2 RNA is  generally detectable in upper and lower respiratory specimens during the acute phase of infection. Negative results do not preclude SARS-CoV-2 infection, do not rule out co-infections with other pathogens, and should not be used as the sole basis for treatment or other patient management decisions. Negative results must be combined with clinical observations, patient history, and epidemiological information. The expected result is Negative.  Fact Sheet for Patients: SugarRoll.be  Fact Sheet for Healthcare Providers: https://www.woods-mathews.com/  This test is not yet approved or cleared by the [place] FDA and  has been authorized for detection and/or diagnosis of SARS-CoV-2 by FDA under an Emergency Use Authorization (EUA). This EUA will remain  in  effect (meaning this test can be used) for the duration of the COVID-19 declaration under Se ction 564(b)(1) of the Act, 21 U.S.C. section 360bbb-3(b)(1), unless the authorization is terminated or revoked sooner.  Performed at [facility], [place] [address] [place], [place] [address]   SARS CORONAVIRUS 2 (TAT 6-24 HRS) Nasopharyngeal Nasopharyngeal Swab     Status: None   Collection Time: 08/27/2020  4:36 PM   Specimen: Nasopharyngeal Swab  Result Value Ref Range Status   SARS Coronavirus 2 NEGATIVE NEGATIVE Final    Comment: (NOTE) SARS-CoV-2 target nucleic acids are NOT DETECTED.  The SARS-CoV-2 RNA is generally detectable in upper and lower respiratory specimens during the acute phase of infection. Negative results do not preclude SARS-CoV-2 infection, do not rule out co-infections with other pathogens, and should not be used as the sole basis for treatment or other patient management decisions. Negative results must be combined with clinical observations, patient history, and epidemiological information. The expected result is Negative.  Fact Sheet for  Patients: SugarRoll.be  Fact Sheet for Healthcare Providers: https://www.woods-mathews.com/  This test is not yet approved or cleared by the [place] FDA and  has been authorized for detection and/or diagnosis of SARS-CoV-2 by FDA under an Emergency Use Authorization (EUA). This EUA will remain  in effect (meaning this test can be used) for the duration of the COVID-19 declaration under Se ction 564(b)(1) of the Act, 21 U.S.C. section 360bbb-3(b)(1), unless the authorization is terminated or revoked sooner.  Performed at [facility], [place] [address] [place], [place] [address]      Radiology Studies: No results found. US Renal  Final Result      Scheduled Meds: . aspirin  81 mg Oral Q breakfast  . heparin  5,000 Units Subcutaneous Q8H  . levothyroxine  100 mcg Oral QAC breakfast  . pantoprazole  40 mg Oral Daily  . sevelamer carbonate  800 mg Oral TID WC  . thiamine  100 mg Oral Daily   PRN Meds: acetaminophen **OR** acetaminophen, calcium carbonate (dosed in mg elemental calcium), camphor-menthol **AND** hydrOXYzine, docusate sodium, feeding supplement (NEPRO CARB STEADY), ondansetron **OR** ondansetron (ZOFRAN) IV, sorbitol, triamcinolone, zolpidem Continuous Infusions:    LOS: 4 days  Time spent: Greater than 50% of the 35 minute visit was spent in counseling/coordination of care for the patient as laid out in the A&P.   Dwyane Dee, MD [facility] 08/26/2020, 12:27 PM

## 2020-08-27 DIAGNOSIS — C649 Malignant neoplasm of unspecified kidney, except renal pelvis: Secondary | ICD-10-CM

## 2020-08-27 DIAGNOSIS — N1832 Chronic kidney disease, stage 3b: Secondary | ICD-10-CM | POA: Diagnosis not present

## 2020-08-27 DIAGNOSIS — R531 Weakness: Secondary | ICD-10-CM | POA: Diagnosis not present

## 2020-08-27 DIAGNOSIS — N179 Acute kidney failure, unspecified: Secondary | ICD-10-CM | POA: Diagnosis not present

## 2020-08-27 DIAGNOSIS — Z515 Encounter for palliative care: Secondary | ICD-10-CM | POA: Diagnosis not present

## 2020-08-27 DIAGNOSIS — Z7189 Other specified counseling: Secondary | ICD-10-CM | POA: Diagnosis not present

## 2020-08-27 LAB — CBC WITH DIFFERENTIAL/PLATELET
Abs Immature Granulocytes: 0.14 K/uL — ABNORMAL HIGH (ref 0.00–0.07)
Basophils Absolute: 0 K/uL (ref 0.0–0.1)
Basophils Relative: 0 %
Eosinophils Absolute: 0 K/uL (ref 0.0–0.5)
Eosinophils Relative: 0 %
HCT: 23.5 % — ABNORMAL LOW (ref 39.0–52.0)
Hemoglobin: 7.7 g/dL — ABNORMAL LOW (ref 13.0–17.0)
Immature Granulocytes: 1 %
Lymphocytes Relative: 8 %
Lymphs Abs: 1.2 K/uL (ref 0.7–4.0)
MCH: 28.2 pg (ref 26.0–34.0)
MCHC: 32.8 g/dL (ref 30.0–36.0)
MCV: 86.1 fL (ref 80.0–100.0)
Monocytes Absolute: 2 K/uL — ABNORMAL HIGH (ref 0.1–1.0)
Monocytes Relative: 14 %
Neutro Abs: 10.4 K/uL — ABNORMAL HIGH (ref 1.7–7.7)
Neutrophils Relative %: 77 %
Platelets: 307 K/uL (ref 150–400)
RBC: 2.73 MIL/uL — ABNORMAL LOW (ref 4.22–5.81)
RDW: 18.3 % — ABNORMAL HIGH (ref 11.5–15.5)
WBC: 13.6 K/uL — ABNORMAL HIGH (ref 4.0–10.5)
nRBC: 0 % (ref 0.0–0.2)

## 2020-08-27 LAB — RENAL FUNCTION PANEL
Albumin: 2 g/dL — ABNORMAL LOW (ref 3.5–5.0)
Anion gap: 15 (ref 5–15)
BUN: 98 mg/dL — ABNORMAL HIGH (ref 8–23)
CO2: 18 mmol/L — ABNORMAL LOW (ref 22–32)
Calcium: 8.1 mg/dL — ABNORMAL LOW (ref 8.9–10.3)
Chloride: 101 mmol/L (ref 98–111)
Creatinine, Ser: 4.32 mg/dL — ABNORMAL HIGH (ref 0.61–1.24)
GFR, Estimated: 14 mL/min — ABNORMAL LOW (ref >60)
Glucose, Bld: 102 mg/dL — ABNORMAL HIGH (ref 70–99)
Phosphorus: 6.2 mg/dL — ABNORMAL HIGH (ref 2.5–4.6)
Potassium: 3.6 mmol/L (ref 3.5–5.1)
Sodium: 134 mmol/L — ABNORMAL LOW (ref 135–145)

## 2020-08-27 LAB — MAGNESIUM: Magnesium: 2.6 mg/dL — ABNORMAL HIGH (ref 1.7–2.4)

## 2020-08-27 MED ORDER — SODIUM BICARBONATE 8.4 % IV SOLN
INTRAVENOUS | Status: DC
  Filled 2020-08-27: qty 850
  Filled 2020-08-27 (×2): qty 150

## 2020-08-27 NOTE — Progress Notes (Signed)
Patient ID: Stephen Grimes, male   DOB: 12/04/1950, 70 y.o.   MRN: [id number] S: Not eating much today O:BP (!) 115/59 (BP Location: Right Arm)   Pulse 89   Temp 98.4 F (36.9 C) (Oral)   Resp 20   Ht 5\' 7"  (1.702 m)   Wt 68.2 kg   SpO2 97%   BMI 23.56 kg/m   Intake/Output Summary (Last 24 hours) at 08/27/2020 1317 Last data filed at 08/27/2020 1100 Gross per 24 hour  Intake 600 ml  Output 670 ml  Net -70 ml   Intake/Output: I/O last 3 completed shifts: In: 9449 [P.O.:780; I.V.:713] Out: 1040 [Urine:1040]  Intake/Output this shift:  Total I/O In: 240 [P.O.:240] Out: -  Weight change: 1.452 kg Gen: frail and chronically ill-appearing CVS: RRR Resp: decreased BS at bases Abd: distended, +BS, soft Ext: trace pretibial edema  Recent Labs  Lab 07/31/2020 1733 07/29/2020 2053 08/21/2020 2338 09/04/2020 0338 09/10/2020 0505 09/19/2020 0619 09/04/2020 1155 08/24/20 0447 08/25/20 0408 08/26/20 0403 08/27/20 0510  NA 130* 130*  --   --  131*  --   --  131* 133* 133* 134*  K 6.5* 6.1* 5.6*   < > 5.3* 4.9 5.0 4.6 3.9 3.7 3.6  CL 100 101  --   --  97*  --   --  98 100 101 101  CO2 15* 14*  --   --  20*  --   --  19* 19* 18* 18*  GLUCOSE 106* 113*  --   --  141*  --   --  116* 108* 113* 102*  BUN 105* 92*  --   --  84*  --   --  92* 88* 97* 98*  CREATININE 5.09* 5.02* 4.77*  --  4.01*  --   --  4.25* 4.25* 4.24* 4.32*  ALBUMIN 2.6*  --   --   --  2.3*  --   --  2.3* 2.1* 2.1* 2.0*  CALCIUM 8.7* 8.6*  --   --  8.3*  --   --  8.3* 8.1* 8.1* 8.1*  PHOS  --   --   --   --   --   --   --  6.8* 7.1* 6.4* 6.2*  AST 73*  --   --   --  57*  --   --   --   --   --   --   ALT 22  --   --   --  24  --   --   --   --   --   --    < > = values in this interval not displayed.   Liver Function Tests: Recent Labs  Lab 07/27/2020 1733 08/26/2020 0505 08/24/20 0447 08/25/20 0408 08/26/20 0403 08/27/20 0510  AST 73* 57*  --   --   --   --   ALT 22 24  --   --   --   --   ALKPHOS 150* 137*  --   --    --   --   BILITOT 0.7 0.7  --   --   --   --   PROT 6.9 6.1*  --   --   --   --   ALBUMIN 2.6* 2.3*   < > 2.1* 2.1* 2.0*   < > = values in this interval not displayed.   No results for input(s): LIPASE, AMYLASE in the last 168 hours. No results for  input(s): AMMONIA in the last 168 hours. CBC: Recent Labs  Lab 09/15/2020 0505 08/24/20 0447 08/25/20 0408 08/26/20 0403 08/27/20 0510  WBC 11.2* 11.9* 13.1* 12.9* 13.6*  NEUTROABS  --   --   --  10.0* 10.4*  HGB 6.5* 8.8* 8.4* 7.7* 7.7*  HCT 19.6* 26.8* 25.5* 23.6* 23.5*  MCV 83.4 84.8 85.6 85.8 86.1  PLT 354 339 352 334 307   Cardiac Enzymes: No results for input(s): CKTOTAL, CKMB, CKMBINDEX, TROPONINI in the last 168 hours. CBG: Recent Labs  Lab 07/30/2020 2050 08/26/20 0729 08/26/20 1101 08/26/20 1654  GLUCAP 104* 100* 97 101*    Iron Studies: No results for input(s): IRON, TIBC, TRANSFERRIN, FERRITIN in the last 72 hours. Studies/Results: No results found. . (feeding supplement) PROSource Plus  30 mL Oral BID BM  . aspirin  81 mg Oral Q breakfast  . heparin  5,000 Units Subcutaneous Q8H  . levothyroxine  100 mcg Oral QAC breakfast  . pantoprazole  40 mg Oral Daily  . sevelamer carbonate  800 mg Oral TID WC  . thiamine  100 mg Oral Daily    BMET    Component Value Date/Time   NA 134 (L) 08/27/2020 0510   K 3.6 08/27/2020 0510   CL 101 08/27/2020 0510   CO2 18 (L) 08/27/2020 0510   GLUCOSE 102 (H) 08/27/2020 0510   BUN 98 (H) 08/27/2020 0510   CREATININE 4.32 (H) 08/27/2020 0510   CALCIUM 8.1 (L) 08/27/2020 0510   GFRNONAA 14 (L) 08/27/2020 0510   GFRAA >60 11/22/2018 0518   CBC    Component Value Date/Time   WBC 13.6 (H) 08/27/2020 0510   RBC 2.73 (L) 08/27/2020 0510   HGB 7.7 (L) 08/27/2020 0510   HGB 13.8 12/11/2011 1531   HCT 23.5 (L) 08/27/2020 0510   HCT 32.0 (L) 09/16/2018 0635   HCT 40.3 12/11/2011 1531   PLT 307 08/27/2020 0510   PLT 64 (L) 12/11/2011 1531   MCV 86.1 08/27/2020 0510   MCV  99.5 (H) 12/11/2011 1531   MCH 28.2 08/27/2020 0510   MCHC 32.8 08/27/2020 0510   RDW 18.3 (H) 08/27/2020 0510   RDW 13.3 12/11/2011 1531   LYMPHSABS 1.2 08/27/2020 0510   LYMPHSABS 1.5 12/11/2011 1531   MONOABS 2.0 (H) 08/27/2020 0510   MONOABS 0.6 12/11/2011 1531   EOSABS 0.0 08/27/2020 0510   EOSABS 0.0 12/11/2011 1531   BASOSABS 0.0 08/27/2020 0510   BASOSABS 0.0 12/11/2011 1531    Assessment/Plan: 1. AKI/CKD stage IV-is normally followed by Nephrology at [facility] (Dr. Smith Mince) who has noted that his baseline Scr has been climbing over the past year from 1.6-1.9 up to 2-2.5 prior to his hospitalization. The AKI/CKD stage IV is presumablydue toischemic ATN in setting of poor po intake and concomitant ARB and diuretic use. BUN/Cr improvedinitiallyafter holding ARB and diuretics andgiving IVF's however it has reached a plateau around 4.25. 1. poor po intake  2. IVF's stopped due to the development of SOB 3. Would not resume ARB due to risks outweighing benefits 4. No indication for dialysis at this time and will continue to follow. 5. Appreciate palliative care consult to help set goals/limits of care as I do not think he would do well with HD.  I did discuss HD with him and what to expect, mainly worsening functional status and fatigue.  He appears a little depressed about his current health issues. 6. He is not a suitable candidate for outpatient hemodialysis given his rapidly  declining functional and nutritional status as well as his progressive metastatic cancer.  Would not offer RRT.  Discussed with Palliative Care who are in agreement. 2. Metastatic renal cell carcinoma- failing current chemo and was taken off 08/15/20 with plan to try axitinib, however now with worsening renal function. Palliative care is following patient. 3. Cirrhosis 4. Hyperkalemia- improved with IVF's and holding ARB 5. Anemia of malignancy/chemo- transfuse prn, no ESA 6. Moderate protein malnutrition-  with cirrhosis and metastatic cancer 7. HTN- stable 8. Hyponatremia- multifactorial, edema, AKI. Improving with IVF's, cont to follow. 9. Disposition- pt with metastatic renal cell carcinoma with progressive disease despite ongoing chemo. Appreciate palliative care consult to help set goals/limits of care, he is now DNR.   Donetta Potts, MD [employer] [phone]

## 2020-08-27 NOTE — Progress Notes (Signed)
Daily Progress Note   Patient Name: Stephen Grimes       Date: 08/27/2020 DOB: [birth year]  Age: 70 y.o. MRN#: [id number] Attending Physician: Dwyane Dee, MD Primary Care Physician: Seward Carol, MD Admit Date: 08/02/2020  Reason for Consultation/Follow-up: Establishing goals of care  Subjective: Patient is resting in bed.  He appears weak.  He is awake alert and oriented.  He complains of mild generalized distress, does not admit to specific shortness of breath.  Appears weaker than when I saw him at the time of initial consultation on 08-24-20.  CODE STATUS and goals of care discussions were undertaken.  I reviewed with him his previously prepared advance care directives.  We discussed about appropriate healthcare power of attorney agent.  Goals wishes and values attempted to be explored.  See below.  Length of Stay: 5  Current Medications: Scheduled Meds:  . (feeding supplement) PROSource Plus  30 mL Oral BID BM  . aspirin  81 mg Oral Q breakfast  . heparin  5,000 Units Subcutaneous Q8H  . levothyroxine  100 mcg Oral QAC breakfast  . pantoprazole  40 mg Oral Daily  . sevelamer carbonate  800 mg Oral TID WC  . thiamine  100 mg Oral Daily    Continuous Infusions: . sodium bicarbonate (isotonic) 150 mEq in D5W 1000 mL infusion      PRN Meds: acetaminophen **OR** acetaminophen, calcium carbonate (dosed in mg elemental calcium), camphor-menthol **AND** hydrOXYzine, docusate sodium, feeding supplement (NEPRO CARB STEADY), ondansetron **OR** ondansetron (ZOFRAN) IV, sorbitol, triamcinolone, zolpidem  Physical Exam         Chronically ill-appearing Appears in mild distress Irregularly irregular Shallow regular breath sounds Abdomen is distended has a palpable mass Does not have  edema Has some muscle wasting  Vital Signs: BP (!) 115/59 (BP Location: Right Arm)   Pulse 89   Temp 98.4 F (36.9 C) (Oral)   Resp 20   Ht 5\' 7"  (1.702 m)   Wt 68.2 kg   SpO2 97%   BMI 23.56 kg/m  SpO2: SpO2: 97 % O2 Device: O2 Device: Room Air O2 Flow Rate:    Intake/output summary:   Intake/Output Summary (Last 24 hours) at 08/27/2020 1041 Last data filed at 08/27/2020 0500 Gross per 24 hour  Intake 480 ml  Output 670 ml  Net -190 ml   LBM: Last BM Date: 08/26/20 Baseline Weight: Weight: 59.9 kg Most recent weight: Weight: 68.2 kg       Palliative Assessment/Data:      Patient Active Problem List   Diagnosis Date Noted  . Protein calorie malnutrition ([place]) 08/25/2020  . History of cardiomyopathy 08/25/2020  . COPD (chronic obstructive pulmonary disease) ([place]) 08/25/2020  . Normocytic anemia 08/25/2020  . Irregular cardiac rhythm 08/25/2020  . Acute renal failure superimposed on stage 3b chronic kidney disease ([place]) 08/06/2020  . Hepatic encephalopathy ([place]) 11/23/2018  . Palliative care by specialist   . Goals of care, counseling/discussion   . Encephalopathy   . AKI (acute kidney injury) ([place])   . Acute congestive heart failure ([place])   . Metastatic renal cell carcinoma ([place])   . Acute respiratory failure ([place]) 11/16/2018  . HCAP (healthcare-associated pneumonia) 09/12/2018  . Hyponatremia 09/12/2018  . Acute metabolic encephalopathy 77/41/2878  . Sepsis ([place]) 09/12/2018  . Cirrhosis of liver ([place]) 06/10/2018  . Encounter for long-term (current) use of high-risk medication 06/10/2018  . Thrombocytopathia ([place]) 06/10/2018  . Metastatic renal cell carcinoma to lymph node ([place]) 08/31/2016  . Alcoholic cirrhosis of liver without ascites ([place]) 03/19/2016  . ED (erectile dysfunction) 03/19/2016  . Malignant neoplasm of unspecified kidney, except renal pelvis ([place]) 03/19/2016  . AKI (acute kidney injury) ([place]) 07/02/2014  . Essential (primary) hypertension  07/02/2014  . Hypothyroidism, unspecified 07/02/2014  . Thrombocytopenia ([place]) 12/11/2011    Palliative Care Assessment & Plan   Patient Profile:    Assessment: 70 year old gentleman with right-sided renal cell carcinoma status post nephrectomy.  Patient also has history of liver cirrhosis, coronary artery disease and hypertension.  Previous history of alcoholism.  Has a history of COPD and hypothyroidism.  Patient follows with [facility] for his cancer care reportedly has been told recently that he was not benefiting anymore from chemotherapy.  And admitted to the hospital with creatinine of 5 and potassium of 6.5.  Patient is being followed by renal colleagues.  Patient was also seen by palliative services in the outpatient setting.  Palliative medicine team consulted here in the hospital for ongoing goals of care discussions.  Recommendations/Plan:  Goals of care discussions:  I discussed with the patient about scope of current hospitalization as well as his serious underlying illnesses.  Goals wishes and values attempted to be explored.  We discussed extensively about CODE STATUS and differences between full code versus DO NOT RESUSCITATE/DO NOT INTUBATE.  Patient states, " I want to exhaust all resources, but if I am gone, do not belabor the point." We discussed about continuing current mode of care however at point of death, to have DNR/DNI.  At the time of his passing, discussed medical recommendation to continue efforts aimed at maximizing comfort and avoidance of suffering.  Discussed about judicious use of medications to prevent common symptoms at end-of-life.  Discussed that patient undergoing the full scope of a resuscitative attempt might have more risks than benefits, might be traumatic, and frankly might not even be successful.  If life review performed.  Patient preferred advance care directives in the year 2000.  At that time he designated his wife [name] to be his healthcare  power of attorney agent.  In those initial paperwork he made his wishes known that he would not want to be kept alive by artificial means if he was in a persistent vegetative state.  At present, he states that his wife has dementia  and that he was her primary caregiver.  He states that his sister-in-law his patient's wife as well as his niece [name] [phone] are helping him, patient states he was under the process of updating his living will and healthcare power of attorney agent and wished to to designate his niece as his new healthcare power of attorney agent.  For time for space and reflection.  All of the patient's questions and concerns addressed to the best of my ability.  Patient wishes to establish DO NOT RESUSCITATE/DO NOT INTUBATE however he wished for me to speak with his niece as well.  Call placed and discussed with Stephen Grimes [phone].  I discussed with her about patient's wishes to establish DNR/DNI.  I discussed with her frankly but compassionately about the patient's ongoing decline not just from a renal standpoint but from a global functional standpoint as well and that this points towards a poor prognosis and markedly limited life expectancy.  She states she is aware.  She wishes to bring in the patient's wife to visit with the patient since the patient's wife has dementia and cannot come by herself.  The only limitation of care that the patient desires is DNR/DNI at time of death.  He wishes to continue with all offered interventions and therapies for now and would also consider HD he is saying.   PMT to follow.   Code Status:    Code Status Orders  (From admission, onward)         Start     Ordered   08/27/20 1033  Do not attempt resuscitation (DNR)  Continuous       Question Answer Comment  In the event of cardiac or respiratory ARREST Do not call a "code blue"   In the event of cardiac or respiratory ARREST Do not perform Intubation, CPR, defibrillation or ACLS    In the event of cardiac or respiratory ARREST Use medication by any route, position, wound care, and other measures to relive pain and suffering. May use oxygen, suction and manual treatment of airway obstruction as needed for comfort.      08/27/20 1035        Code Status History    Date Active Date Inactive Code Status Order ID Comments User Context   08/05/2020 2337 08/27/2020 1035 Full Code [id number]  Elwyn Reach, MD ED   11/19/2018 1012 11/23/2018 1932 DNR [id number]  Cristal Generous, NP Inpatient   11/16/2018 1452 11/19/2018 1012 Full Code [id number]  Melvenia Needles, NP ED   09/12/2018 2224 09/18/2018 1626 Full Code [id number]  Bonnielee Haff, MD ED   Advance Care Planning Activity       Prognosis:   Unable to determine  Discharge Planning:  To Be Determined  Care plan was discussed with  Patient and niece.   Thank you for allowing the Palliative Medicine Team to assist in the care of this patient.   Time In: 10 Time Out: 10.40 Total Time 40 Prolonged Time Billed no      Greater than 50%  of this time was spent counseling and coordinating care related to the above assessment and plan.  Loistine Chance, MD  Please contact Palliative Medicine Team phone at [phone] for questions and concerns.

## 2020-08-27 NOTE — Progress Notes (Addendum)
70 yo M admitted with ARF on CKD. Hx of met renal cell ca, CAD, cirrhosis, COPD, CKD.  Received referral to assist with HHPT. Met with pt. He plans to return home with his wife and the support of his niece. He agrees with HHPT. He is not sure if he needs a [place] aide. He reports that he used [facility] in the past and he wants to use them again. Contacted Georgina Snell with [facility] and he accepted the referral.  Will continue to f/u to assist with the D/C plan.

## 2020-08-27 NOTE — Progress Notes (Signed)
PROGRESS NOTE    Stephen Grimes   EHU:[id number]  DOB: 07-03-1951  DOA: 08/02/2020     5  PCP: Seward Carol, MD  CC: Sent to the hospital for abnormal lab work  Hospital Course: Stephen Grimes is a 70 y.o. male with medical history significant of right renal cell carcinoma status post right nephrectomy 6 years ago, liver cirrhosis, coronary artery disease, essential hypertension, previous history of of alcoholism, hypothyroidism and COPD who has been doing well after his nephrectomy and chemotherapy for his renal cell carcinoma , was told that he was failing chemotherapy, was being evaluated for EGD and echocardiogram in preparation for new chemo, and his labs revealed creatinine of 4.  He was sent to ED for further evaluation.  On arrival to ED he was found to have a creatinine of 5 and hyperkalemic with a potassium of 6.5 He was referred to [facility] for admission .  Nephrology consulted for further recommendations.  Ultrasound of the renal did not show any obstructive pathology. Given his multiple comorbidities with underlying malignancy, palliative care was consulted.  He initially expressed wishes to remain full code with full measures for treatment. As hospitalization progressed and his quality of life began to decrease along with no significant recovery of renal function, goals of care were again discussed on 08/27/2020 with palliative care.  At that time, he elected for transitioning to a DNR/DNI.  He still wished to continue treatment medically as able. He again was evaluated by nephrology, and not felt to be a good dialysis candidate given his deteriorating functional status and underlying aggressive malignancy. Plan will be to continue fluids as he can tolerate and monitor renal function.   Interval History:  No events overnight.  He appears more lethargic and overall depressed today.  He endorsed that his shortness of breath yesterday was more so from physical exertion and talking on  the phone.  He was amenable for retrying fluids today and understands to inform nurse if he becomes short of breath.  He also had more discussions with palliative care today and changed his CODE STATUS to DNR/DNI.  He still wishes to have some medical treatment and to exhaust all options.  Old records reviewed in assessment of this patient  ROS: Constitutional: negative for chills and fevers, Respiratory: negative for cough, Cardiovascular: negative for chest pain and Gastrointestinal: negative for abdominal pain  Assessment & Plan: * Acute renal failure superimposed on stage 3b chronic kidney disease ([place]) - patient has history of CKD3b. Baseline creat ~ 2.2 - 2.4, eGFR 33 - patient presents with increase in creat >0.3 mg/dL above baseline, creat increase >1.5x baseline presumed to have occurred within past 7 days PTA - nephrology following; patient considered poor dialysis candidate at this time -No obstructive etiology noted on renal ultrasound -GOC discussion held bedside on 08/25/2020. As of 2/5, he has elected for DNR/DNI due to minimal improvement and decrease overall in quality of life.  - RRT not an option per nephrology due to functional status and malignancy, I also agree with this; patient also understands and this has been discussed personally with patient - his SOB described on 2/4 was moreso attributed to his physical exertion that day and talking on a phone a lot he says; he did not think the IVF made him feel SOB. Therefore, since RRT not an option, I discussed we would try IVF again and he can inform us if he feels SOB at which time we can stop fluids  again but for now if wishing for any medical treatment, this is his best option; he is also in agreement   Malignant neoplasm of unspecified kidney, except renal pelvis ([place]) Oncology History Overview Note  Previous Treatments: Sunitnib (07/27/2016-12/03/2016) Nivolumab+Ipilimumab (2 cycles)/ Nivo via OMNIVORE clinical trial  (01/21/2017-05/29/2017) Cabozantinib (07/08/2017-07/22/2018) Lenvatinib +everolimus (07/24/2018-11/17/2018) Everolimus 38m (02/16/2019- D/Ced 07/27/2020)  - [facility] reviewed in care everywhere -Appears to have overall poor prognosis given advanced malignancy and failure of previous tried treatment plans -On admission, patient had initially wished to pursue outpatient treatments however he continues to decline and has had further discussions with palliative care.  As of 08/27/2020, he wishes for ongoing medical treatment but has changed his status to DNR/DNI -Further plans to be determined pending further clinical course  Hyperkalemia-resolved as of 08/26/2020 - 2/2 renal failure - resolved with Lokelma; can d/c now  Irregular cardiac rhythm -Patient does have history of Takotsubo cardiomyopathy however no noted history of A. Fib. -EKG obtained on 08/25/2020 and personally reviewed.  Sinus rhythm with significant sinus arrhythmia.  PR 152, QTc 490 -No further work-up at this time.  Findings discussed with patient  Protein calorie malnutrition ([facility] - Patient's BMI is Body mass index is 20.58 kg/m.. - Patient has the following signs/symptoms consistent with PCM: (fat loss, muscle loss, muscle wasting, cachexia). - consult placed for RD as well - continue diet and supplements   Alcoholic cirrhosis of liver without ascites ([facility] -Mentation remains normal; no signs of encephalopathy -Denies any nausea/vomiting  Normocytic anemia -Presumed anemia of chronic disease due to underlying CKD, renal cell carcinoma and right nephrectomy -Hemoglobin 6.5 g/dL on admission, given 1 unit PRBC -Continue monitoring CBC daily  COPD (chronic obstructive pulmonary disease) (HCC) -No signs or symptoms of exacerbation at this time -Continue supportive care  History of cardiomyopathy -[facility]  Recently underwent repeat echo on 07/28/2020 -EF 55 to 677% no diastolic dysfunction  noted.  LV is normal size, no LVH, no pulmonary hypertension appreciated on echo  Hyponatremia -Likely multifactorial in setting of underlying cirrhosis as well as probable prerenal component on admission -Fluids have been discontinued for now -Continue trending BMP  Hypothyroidism, unspecified -Continue Synthroid; history of abnormal TSH -Last free T4 1.47 (normal) on 07/27/2020  Essential (primary) hypertension -Blood pressure controlled at this time without need for medication; on low end of normal actually -will continue monitoring for any need for medications   Antimicrobials:   DVT prophylaxis: heparin injection 5,000 Units Start: 07/25/2020 2345   Code Status:   Code Status: DNR Family Communication: none present  Disposition Plan: Status is: Inpatient  Remains inpatient appropriate because:Ongoing diagnostic testing needed not appropriate for outpatient work up, Unsafe d/c plan, IV treatments appropriate due to intensity of illness or inability to take PO and Inpatient level of care appropriate due to severity of illness   Dispo: The patient is from: Home              Anticipated d/c is to: pending PT eval; patient starting to decline some              Anticipated d/c date is: > 3 days              Patient currently is not medically stable to d/c.   Difficult to place patient No  Risk of unplanned readmission score: Unplanned Admission- Pilot do not use: 25.45   Objective: Blood pressure (!) 111/50, pulse 85, temperature 98.8 F (37.1  C), temperature source Oral, resp. rate (!) 24, height _0  (1.702 m), weight 68.2 kg, SpO2 97 %.  Examination: General appearance: laying in bed, appears even more tired/weak today and depressed Head: Normocephalic, without obvious abnormality, atraumatic Eyes: EOMI Lungs: clear to auscultation bilaterally Heart: regularly irregular rhythm and S1, S2 normal Abdomen: Large right lower abdominal irregularity palpated, mildly tender to  palpation and is soft.  Bowel sounds present Extremities: thin, no edema noted Skin: mobility and turgor normal Neurologic: No focal deficits.  No asterixis.  No obvious encephalopathy.  Consultants:   Nephrology  PCM  Procedures:     Data Reviewed: I have personally reviewed following labs and imaging studies Results for orders placed or performed during the hospital encounter of 08/21/2020 (from the past 24 hour(s))  Glucose, capillary     Status: Abnormal   Collection Time: 08/26/20  4:54 PM  Result Value Ref Range   Glucose-Capillary 101 (H) 70 - 99 mg/dL  Renal function panel     Status: Abnormal   Collection Time: 08/27/20  5:10 AM  Result Value Ref Range   Sodium 134 (L) 135 - 145 mmol/L   Potassium 3.6 3.5 - 5.1 mmol/L   Chloride 101 98 - 111 mmol/L   CO2 18 (L) 22 - 32 mmol/L   Glucose, Bld 102 (H) 70 - 99 mg/dL   BUN 98 (H) 8 - 23 mg/dL   Creatinine, Ser 4.32 (H) 0.61 - 1.24 mg/dL   Calcium 8.1 (L) 8.9 - 10.3 mg/dL   Phosphorus 6.2 (H) 2.5 - 4.6 mg/dL   Albumin 2.0 (L) 3.5 - 5.0 g/dL   GFR, Estimated 14 (L) >60 mL/min   Anion gap 15 5 - 15  CBC with Differential/Platelet     Status: Abnormal   Collection Time: 08/27/20  5:10 AM  Result Value Ref Range   WBC 13.6 (H) 4.0 - 10.5 K/uL   RBC 2.73 (L) 4.22 - 5.81 MIL/uL   Hemoglobin 7.7 (L) 13.0 - 17.0 g/dL   HCT 23.5 (L) 39.0 - 52.0 %   MCV 86.1 80.0 - 100.0 fL   MCH 28.2 26.0 - 34.0 pg   MCHC 32.8 30.0 - 36.0 g/dL   RDW 18.3 (H) 11.5 - 15.5 %   Platelets 307 150 - 400 K/uL   nRBC 0.0 0.0 - 0.2 %   Neutrophils Relative % 77 %   Neutro Abs 10.4 (H) 1.7 - 7.7 K/uL   Lymphocytes Relative 8 %   Lymphs Abs 1.2 0.7 - 4.0 K/uL   Monocytes Relative 14 %   Monocytes Absolute 2.0 (H) 0.1 - 1.0 K/uL   Eosinophils Relative 0 %   Eosinophils Absolute 0.0 0.0 - 0.5 K/uL   Basophils Relative 0 %   Basophils Absolute 0.0 0.0 - 0.1 K/uL   Immature Granulocytes 1 %   Abs Immature Granulocytes 0.14 (H) 0.00 - 0.07 K/uL   Magnesium     Status: Abnormal   Collection Time: 08/27/20  5:10 AM  Result Value Ref Range   Magnesium 2.6 (H) 1.7 - 2.4 mg/dL    Recent Results (from the past 240 hour(s))  SARS CORONAVIRUS 2 (TAT 6-24 HRS) Nasopharyngeal Nasopharyngeal Swab     Status: None   Collection Time: 08/19/20 12:44 PM   Specimen: Nasopharyngeal Swab  Result Value Ref Range Status   SARS Coronavirus 2 NEGATIVE NEGATIVE Final    Comment: (NOTE) SARS-CoV-2 target nucleic acids are NOT DETECTED.  The SARS-CoV-2 RNA is generally detectable  in upper and lower respiratory specimens during the acute phase of infection. Negative results do not preclude SARS-CoV-2 infection, do not rule out co-infections with other pathogens, and should not be used as the sole basis for treatment or other patient management decisions. Negative results must be combined with clinical observations, patient history, and epidemiological information. The expected result is Negative.  Fact Sheet for Patients: SugarRoll.be  Fact Sheet for Healthcare Providers: https://www.woods-mathews.com/  This test is not yet approved or cleared by the [place] FDA and  has been authorized for detection and/or diagnosis of SARS-CoV-2 by FDA under an Emergency Use Authorization (EUA). This EUA will remain  in effect (meaning this test can be used) for the duration of the COVID-19 declaration under Se ction 564(b)(1) of the Act, 21 U.S.C. section 360bbb-3(b)(1), unless the authorization is terminated or revoked sooner.  Performed at [facility], [place] [address], [place], [place] [address]   SARS CORONAVIRUS 2 (TAT 6-24 HRS) Nasopharyngeal Nasopharyngeal Swab     Status: None   Collection Time: 09/09/2020  4:36 PM   Specimen: Nasopharyngeal Swab  Result Value Ref Range Status   SARS Coronavirus 2 NEGATIVE NEGATIVE Final    Comment: (NOTE) SARS-CoV-2 target nucleic acids are NOT  DETECTED.  The SARS-CoV-2 RNA is generally detectable in upper and lower respiratory specimens during the acute phase of infection. Negative results do not preclude SARS-CoV-2 infection, do not rule out co-infections with other pathogens, and should not be used as the sole basis for treatment or other patient management decisions. Negative results must be combined with clinical observations, patient history, and epidemiological information. The expected result is Negative.  Fact Sheet for Patients: SugarRoll.be  Fact Sheet for Healthcare Providers: https://www.woods-mathews.com/  This test is not yet approved or cleared by the [place] FDA and  has been authorized for detection and/or diagnosis of SARS-CoV-2 by FDA under an Emergency Use Authorization (EUA). This EUA will remain  in effect (meaning this test can be used) for the duration of the COVID-19 declaration under Se ction 564(b)(1) of the Act, 21 U.S.C. section 360bbb-3(b)(1), unless the authorization is terminated or revoked sooner.  Performed at [facility], [place] [address], [place], [place] [address]      Radiology Studies: No results found. US Renal  Final Result      Scheduled Meds: . (feeding supplement) PROSource Plus  30 mL Oral BID BM  . aspirin  81 mg Oral Q breakfast  . heparin  5,000 Units Subcutaneous Q8H  . levothyroxine  100 mcg Oral QAC breakfast  . pantoprazole  40 mg Oral Daily  . sevelamer carbonate  800 mg Oral TID WC  . thiamine  100 mg Oral Daily   PRN Meds: acetaminophen **OR** acetaminophen, calcium carbonate (dosed in mg elemental calcium), camphor-menthol **AND** hydrOXYzine, docusate sodium, feeding supplement (NEPRO CARB STEADY), ondansetron **OR** ondansetron (ZOFRAN) IV, sorbitol, triamcinolone, zolpidem Continuous Infusions: . sodium bicarbonate (isotonic) 150 mEq in D5W 1000 mL infusion       LOS: 5 days  Time spent: Greater  than 50% of the 35 minute visit was spent in counseling/coordination of care for the patient as laid out in the A&P.   Dwyane Dee, MD [facility] 08/27/2020, 1:39 PM

## 2020-08-27 NOTE — Progress Notes (Signed)
Physical Therapy Treatment Patient Details Name: Stephen Grimes MRN: [id number] DOB: [birth year] Today's Date: 08/27/2020    History of Present Illness 70 yo male admitted with ARF on CKD. Hx of met renal cell ca, CAD, cirrhosis, COPD, CKD    PT Comments    Patient seems weaker and with decreased activity tolerance compared to evaluation.  He was participative, but did not eat much of his dinner as reported feeling nausea once starting to eat.  He was also not really able to take hands off the walker and was dependent on UE support.  He landed on the bed rather than sitting down with less eccentric control. Feel he will continue to benefit from skilled PT in the acute setting and hopeful he can d/c home with capable 24 hour supervision and follow up HHPT.    Follow Up Recommendations  Home health PT;Supervision/Assistance - 24 hour     Equipment Recommendations  None recommended by PT    Recommendations for Other Services       Precautions / Restrictions Precautions Precautions: Fall    Mobility  Bed Mobility Overal bed mobility: Needs Assistance Bed Mobility: Supine to Sit;Sit to Supine     Supine to sit: Min assist;HOB elevated Sit to supine: Min assist   General bed mobility comments: help to move L leg to EOB after pt struggling, then to scoot to EOB; to supine cues for technique to sidelying, then assist guiding leg onto bed and repositioning hips and shoulder once supine  Transfers Overall transfer level: Needs assistance Equipment used: Rolling walker (2 wheeled) Transfers: Sit to/from Stand Sit to Stand: Min assist         General transfer comment: up to RW with A for balance with increased time,  Sat with uncontrolled descent onto bed  Ambulation/Gait Ambulation/Gait assistance: Min guard;Min assist Gait Distance (Feet): 40 Feet Assistive device: Rolling walker (2 wheeled) Gait Pattern/deviations: Step-to pattern;Decreased stride length;Shuffle      General Gait Details: limited to in room today due to nausea; shuffling feet and increased time needed with cues for walker management on turns   Stairs             Wheelchair Mobility    Modified Rankin (Stroke Patients Only)       Balance Overall balance assessment: Needs assistance   Sitting balance-Leahy Scale: Good     Standing balance support: Bilateral upper extremity supported Standing balance-Leahy Scale: Poor Standing balance comment: today he needed UE support for balance                            Cognition Arousal/Alertness: Awake/alert Behavior During Therapy: Flat affect Overall Cognitive Status: Within Functional Limits for tasks assessed                                        Exercises General Exercises - Lower Extremity Long Arc Quad: AROM;5 reps;Both;Seated Toe Raises: AROM;5 reps;Both;Seated Heel Raises: AROM;10 reps;Both;Seated    General Comments General comments (skin integrity, edema, etc.): protruding abdomen; c/o tightness      Pertinent Vitals/Pain Pain Assessment: Faces Faces Pain Scale: Hurts little more Pain Location: abdomen Pain Descriptors / Indicators: Tightness Pain Intervention(s): Monitored during session;Repositioned    Home Living  Prior Function            PT Goals (current goals can now be found in the care plan section) Progress towards PT goals: Not progressing toward goals - comment    Frequency    Min 3X/week      PT Plan Current plan remains appropriate    Co-evaluation              AM-PAC PT "6 Clicks" Mobility   Outcome Measure  Help needed turning from your back to your side while in a flat bed without using bedrails?: A Little Help needed moving from lying on your back to sitting on the side of a flat bed without using bedrails?: A Little Help needed moving to and from a bed to a chair (including a wheelchair)?: A Little Help  needed standing up from a chair using your arms (e.g., wheelchair or bedside chair)?: A Little Help needed to walk in hospital room?: A Little Help needed climbing 3-5 steps with a railing? : A Lot 6 Click Score: 17    End of Session   Activity Tolerance: Patient limited by fatigue Patient left: in bed;with call bell/phone within reach   PT Visit Diagnosis: Muscle weakness (generalized) (M62.81);Unsteadiness on feet (R26.81);Other abnormalities of gait and mobility (R26.89)     Time: 9828-6751 PT Time Calculation (min) (ACUTE ONLY): 27 min  Charges:  $Gait Training: 8-22 mins $Therapeutic Activity: 8-22 mins                     Magda Kiel, PT Acute Rehabilitation Services [phone] [phone] 08/27/2020    Reginia Naas 08/27/2020, 6:50 PM

## 2020-08-28 DIAGNOSIS — N19 Unspecified kidney failure: Secondary | ICD-10-CM

## 2020-08-28 DIAGNOSIS — R52 Pain, unspecified: Secondary | ICD-10-CM

## 2020-08-28 DIAGNOSIS — Z7189 Other specified counseling: Secondary | ICD-10-CM | POA: Diagnosis not present

## 2020-08-28 DIAGNOSIS — G9349 Other encephalopathy: Secondary | ICD-10-CM

## 2020-08-28 DIAGNOSIS — R531 Weakness: Secondary | ICD-10-CM | POA: Diagnosis not present

## 2020-08-28 DIAGNOSIS — N1832 Chronic kidney disease, stage 3b: Secondary | ICD-10-CM | POA: Diagnosis not present

## 2020-08-28 DIAGNOSIS — N179 Acute kidney failure, unspecified: Secondary | ICD-10-CM | POA: Diagnosis not present

## 2020-08-28 DIAGNOSIS — Z515 Encounter for palliative care: Secondary | ICD-10-CM | POA: Diagnosis not present

## 2020-08-28 LAB — CBC WITH DIFFERENTIAL/PLATELET
Abs Immature Granulocytes: 0.16 10*3/uL — ABNORMAL HIGH (ref 0.00–0.07)
Basophils Absolute: 0 10*3/uL (ref 0.0–0.1)
Basophils Relative: 0 %
Eosinophils Absolute: 0 10*3/uL (ref 0.0–0.5)
Eosinophils Relative: 0 %
HCT: 27.2 % — ABNORMAL LOW (ref 39.0–52.0)
Hemoglobin: 8.6 g/dL — ABNORMAL LOW (ref 13.0–17.0)
Immature Granulocytes: 1 %
Lymphocytes Relative: 5 %
Lymphs Abs: 0.8 10*3/uL (ref 0.7–4.0)
MCH: 27.3 pg (ref 26.0–34.0)
MCHC: 31.6 g/dL (ref 30.0–36.0)
MCV: 86.3 fL (ref 80.0–100.0)
Monocytes Absolute: 1.6 10*3/uL — ABNORMAL HIGH (ref 0.1–1.0)
Monocytes Relative: 11 %
Neutro Abs: 12.2 10*3/uL — ABNORMAL HIGH (ref 1.7–7.7)
Neutrophils Relative %: 83 %
Platelets: 394 10*3/uL (ref 150–400)
RBC: 3.15 MIL/uL — ABNORMAL LOW (ref 4.22–5.81)
RDW: 18.6 % — ABNORMAL HIGH (ref 11.5–15.5)
WBC: 14.8 10*3/uL — ABNORMAL HIGH (ref 4.0–10.5)
nRBC: 0.2 % (ref 0.0–0.2)

## 2020-08-28 LAB — RENAL FUNCTION PANEL
Albumin: 2 g/dL — ABNORMAL LOW (ref 3.5–5.0)
Anion gap: 17 — ABNORMAL HIGH (ref 5–15)
BUN: 107 mg/dL — ABNORMAL HIGH (ref 8–23)
CO2: 21 mmol/L — ABNORMAL LOW (ref 22–32)
Calcium: 8.3 mg/dL — ABNORMAL LOW (ref 8.9–10.3)
Chloride: 97 mmol/L — ABNORMAL LOW (ref 98–111)
Creatinine, Ser: 4.28 mg/dL — ABNORMAL HIGH (ref 0.61–1.24)
GFR, Estimated: 14 mL/min — ABNORMAL LOW (ref >60)
Glucose, Bld: 125 mg/dL — ABNORMAL HIGH (ref 70–99)
Phosphorus: 5.6 mg/dL — ABNORMAL HIGH (ref 2.5–4.6)
Potassium: 3.3 mmol/L — ABNORMAL LOW (ref 3.5–5.1)
Sodium: 135 mmol/L (ref 135–145)

## 2020-08-28 LAB — MAGNESIUM: Magnesium: 2.8 mg/dL — ABNORMAL HIGH (ref 1.7–2.4)

## 2020-08-28 MED ORDER — LORAZEPAM 2 MG/ML IJ SOLN: 0.5000 mg | INTRAMUSCULAR | Status: DC | PRN

## 2020-08-28 MED ORDER — POTASSIUM CHLORIDE CRYS ER 20 MEQ PO TBCR: 20.0000 meq | EXTENDED_RELEASE_TABLET | Freq: Once | ORAL | Status: DC

## 2020-08-28 MED ORDER — FENTANYL CITRATE (PF) 100 MCG/2ML IJ SOLN: 12.5000 ug | INTRAMUSCULAR | Status: DC | PRN

## 2020-08-28 NOTE — Progress Notes (Signed)
Patient noted to be lethargic, was only able to tell me his name. Noted HR in the 160s manually, Dr. Sabino Gasser notified on the way to see patient. Will continue to assess patient

## 2020-08-28 NOTE — Plan of Care (Signed)
  Problem: Clinical Measurements: Goal: Will remain free from infection Outcome: Progressing   Problem: Safety: Goal: Ability to remain free from injury will improve Outcome: Progressing   

## 2020-08-28 NOTE — Assessment & Plan Note (Signed)
-  Due to worsening renal failure.  Patient not a dialysis candidate.  Has been evaluated by nephrology -Patient now on comfort care measures due to approaching end-of-life from multiple comorbidities and underlying malignancy -Continue comfort care

## 2020-08-28 NOTE — Progress Notes (Signed)
PROGRESS NOTE    Stephen Grimes   QQI:[id number]  DOB: January 12, 1951  DOA: 08/17/2020     6  PCP: Seward Carol, MD  CC: Sent to the hospital for abnormal lab work  Hospital Course: Stephen Grimes is a 70 y.o. male with medical history significant of right renal cell carcinoma status post right nephrectomy 6 years ago, liver cirrhosis, coronary artery disease, essential hypertension, previous history of of alcoholism, hypothyroidism and COPD who has been doing well after his nephrectomy and chemotherapy for his renal cell carcinoma , was told that he was failing chemotherapy, was being evaluated for EGD and echocardiogram in preparation for new chemo, and his labs revealed creatinine of 4.  He was sent to ED for further evaluation.  On arrival to ED he was found to have a creatinine of 5 and hyperkalemic with a potassium of 6.5 He was referred to [facility] for admission .  Nephrology consulted for further recommendations.  Ultrasound of the renal did not show any obstructive pathology. Given his multiple comorbidities with underlying malignancy, palliative care was consulted.  He initially expressed wishes to remain full code with full measures for treatment. As hospitalization progressed and his quality of life began to decrease along with no significant recovery of renal function, goals of care were again discussed on 08/27/2020 with palliative care.  At that time, he elected for transitioning to a DNR/DNI.  He still wished to continue treatment medically as able. He again was evaluated by nephrology, and not felt to be a good dialysis candidate given his deteriorating functional status and underlying aggressive malignancy. Plan will be to continue fluids as he can tolerate and monitor renal function.  On 2/6, patient appeared to have declined even further. His renal function continued to worsen and he became more lethargic and confused. GOC were again discussed with family and ultimately since  patient appears to be approaching end of life, the goal became to pursue comfort; with an in-hospital death expected as patient was not considered stable enough for discharging to residential hospice.    Interval History:  This morning he was noted to be extremely lethargic and eyes were rolled up to the top of his head.  He was unable to fully partake in conversation this morning and it was obvious he was becoming uremic. Called and discussed change in clinical status with Stephen Grimes, his niece.  She understands the poor prognosis and that I have informed her that patient appears to be approaching end-of-life imminently.  Visitation restrictions will be relaxed and family will be allowed to come visit.   ROS: Constitutional: negative for chills and fevers, Respiratory: negative for cough, Cardiovascular: negative for chest pain and Gastrointestinal: negative for abdominal pain  Assessment & Plan: * Acute renal failure superimposed on stage 3b chronic kidney disease ([place]) - patient has history of CKD3b. Baseline creat ~ 2.2 - 2.4, eGFR 33 - patient presents with increase in creat >0.3 mg/dL above baseline, creat increase >1.5x baseline presumed to have occurred within past 7 days PTA - nephrology following; patient considered poor dialysis candidate at this time -No obstructive etiology noted on renal ultrasound -GOC discussion held bedside on 08/25/2020. As of 2/5, he has elected for DNR/DNI due to minimal improvement and decrease overall in quality of life.  - RRT not an option per nephrology due to functional status and malignancy, I also agree with this; patient also understands and this has been discussed personally with patient - his SOB described on  2/4 was moreso attributed to his physical exertion that day and talking on a phone a lot he says; he did not think the IVF made him feel SOB. Therefore, since RRT not an option, I discussed we would try IVF again and he can inform us if he feels SOB  at which time we can stop fluids again but for now if wishing for any medical treatment, this is his best option; he is also in agreement - by 2/6 he continues to decline and now is showing signs of uremia and that he is approaching end of life; discussed [place] with niece and plan is for comfort care at this time with in hospital death expected   Malignant neoplasm of unspecified kidney, except renal pelvis ([place]) Oncology History Overview Note  Previous Treatments: Sunitnib (07/27/2016-12/03/2016) Nivolumab+Ipilimumab (2 cycles)/ Nivo via OMNIVORE clinical trial (01/21/2017-05/29/2017) Cabozantinib (07/08/2017-07/22/2018) Lenvatinib +everolimus (07/24/2018-11/17/2018) Everolimus 69m (02/16/2019- D/Ced 07/27/2020)  - follows with oncology at [facility] note reviewed in care everywhere -Appears to have overall poor prognosis given advanced malignancy and failure of previous tried treatment plans -On admission, patient had initially wished to pursue outpatient treatments however he continues to decline and has had further discussions with palliative care.   - given renal failure, patient unfortunately continues to decline and is now approaching end of life  Uremic encephalopathy -Due to worsening renal failure.  Patient not a dialysis candidate.  Has been evaluated by nephrology -Patient now on comfort care measures due to approaching end-of-life from multiple comorbidities and underlying malignancy -Continue comfort care  Hyperkalemia-resolved as of 08/26/2020 - 2/2 renal failure - resolved with Lokelma; can d/c now  Irregular cardiac rhythm -Patient does have history of Takotsubo cardiomyopathy however no noted history of A. Fib. -EKG obtained on 08/25/2020 and personally reviewed.  Sinus rhythm with significant sinus arrhythmia.  PR 152, QTc 490 -No further work-up at this time.  Findings discussed with patient on diagnosis  Protein calorie malnutrition ([place] - Patient's BMI is Body mass index is 20.58  kg/m.. - Patient has the following signs/symptoms consistent with PCM: (fat loss, muscle loss, muscle wasting, cachexia). - consult placed for RD as well - too encephalopathic now to take in nutrition   Alcoholic cirrhosis of liver without ascites ([place] -now encephalopathic as of 2/6, partly due to renal failure   Normocytic anemia -Presumed anemia of chronic disease due to underlying CKD, renal cell carcinoma and right nephrectomy -Hemoglobin 6.5 g/dL on admission, given 1 unit PRBC  COPD (chronic obstructive pulmonary disease) ([place] -continue comfort care   History of cardiomyopathy -[facility]  Recently underwent repeat echo on 07/28/2020 -EF 55 to 635% no diastolic dysfunction noted.  LV is normal size, no LVH, no pulmonary hypertension appreciated on echo  Hyponatremia -Likely multifactorial in setting of underlying cirrhosis as well as probable prerenal component on admission - no further treatment; goal will now be pursuing comfort care  Hypothyroidism, unspecified -Continue Synthroid; history of abnormal TSH -Last free T4 1.47 (normal) on 07/27/2020  Essential (primary) hypertension -tolerating elevation in HR at this time; goal is comfort   Antimicrobials:   DVT prophylaxis:    Code Status:   Code Status: DNR Family Communication: Stephen Grimes on phone  Disposition Plan: Status is: Inpatient  Remains inpatient appropriate because:Ongoing diagnostic testing needed not appropriate for outpatient work up, Unsafe d/c plan, IV treatments appropriate due to intensity of illness or inability to take PO and Inpatient level of care appropriate due to severity of illness  Dispo: The patient is from: Home              Anticipated d/c is to: In-hospital death expected              Anticipated d/c date is:               Patient currently is not medically stable to d/c.   Difficult to place patient No  Risk of unplanned readmission score: Unplanned Admission- Pilot do not  use: 26.02   Objective: Blood pressure (!) 100/59, pulse (!) 164, temperature 98.6 F (37 C), temperature source Oral, resp. rate (!) 22, height '5\' 7"'  (1.702 m), weight 68.6 kg, SpO2 95 %.  Examination: General appearance: Patient lying in bed appearing extremely lethargic and is confused.  Eyes are rolled up to the top of his head Head: Normocephalic, without obvious abnormality, atraumatic Eyes: EOMI Lungs: clear to auscultation bilaterally Heart: regularly irregular rhythm and S1, S2 normal Abdomen: Large right lower abdominal irregularity palpated, mildly tender to palpation and is soft.  Bowel sounds present Extremities: thin, no edema noted Skin: mobility and turgor normal Neurologic: Uremic encephalopathy appreciated  Consultants:   Nephrology  PCM  Procedures:     Data Reviewed: I have personally reviewed following labs and imaging studies Results for orders placed or performed during the hospital encounter of 08/07/2020 (from the past 24 hour(s))  Renal function panel     Status: Abnormal   Collection Time: 08/28/20  4:53 AM  Result Value Ref Range   Sodium 135 135 - 145 mmol/L   Potassium 3.3 (L) 3.5 - 5.1 mmol/L   Chloride 97 (L) 98 - 111 mmol/L   CO2 21 (L) 22 - 32 mmol/L   Glucose, Bld 125 (H) 70 - 99 mg/dL   BUN 107 (H) 8 - 23 mg/dL   Creatinine, Ser 4.28 (H) 0.61 - 1.24 mg/dL   Calcium 8.3 (L) 8.9 - 10.3 mg/dL   Phosphorus 5.6 (H) 2.5 - 4.6 mg/dL   Albumin 2.0 (L) 3.5 - 5.0 g/dL   GFR, Estimated 14 (L) >60 mL/min   Anion gap 17 (H) 5 - 15  CBC with Differential/Platelet     Status: Abnormal   Collection Time: 08/28/20  4:53 AM  Result Value Ref Range   WBC 14.8 (H) 4.0 - 10.5 K/uL   RBC 3.15 (L) 4.22 - 5.81 MIL/uL   Hemoglobin 8.6 (L) 13.0 - 17.0 g/dL   HCT 27.2 (L) 39.0 - 52.0 %   MCV 86.3 80.0 - 100.0 fL   MCH 27.3 26.0 - 34.0 pg   MCHC 31.6 30.0 - 36.0 g/dL   RDW 18.6 (H) 11.5 - 15.5 %   Platelets 394 150 - 400 K/uL   nRBC 0.2 0.0 - 0.2 %    Neutrophils Relative % 83 %   Neutro Abs 12.2 (H) 1.7 - 7.7 K/uL   Lymphocytes Relative 5 %   Lymphs Abs 0.8 0.7 - 4.0 K/uL   Monocytes Relative 11 %   Monocytes Absolute 1.6 (H) 0.1 - 1.0 K/uL   Eosinophils Relative 0 %   Eosinophils Absolute 0.0 0.0 - 0.5 K/uL   Basophils Relative 0 %   Basophils Absolute 0.0 0.0 - 0.1 K/uL   Immature Granulocytes 1 %   Abs Immature Granulocytes 0.16 (H) 0.00 - 0.07 K/uL  Magnesium     Status: Abnormal   Collection Time: 08/28/20  4:53 AM  Result Value Ref Range   Magnesium 2.8 (H) 1.7 -  2.4 mg/dL    Recent Results (from the past 240 hour(s))  SARS CORONAVIRUS 2 (TAT 6-24 HRS) Nasopharyngeal Nasopharyngeal Swab     Status: None   Collection Time: 08/19/20 12:44 PM   Specimen: Nasopharyngeal Swab  Result Value Ref Range Status   SARS Coronavirus 2 NEGATIVE NEGATIVE Final    Comment: (NOTE) SARS-CoV-2 target nucleic acids are NOT DETECTED.  The SARS-CoV-2 RNA is generally detectable in upper and lower respiratory specimens during the acute phase of infection. Negative results do not preclude SARS-CoV-2 infection, do not rule out co-infections with other pathogens, and should not be used as the sole basis for treatment or other patient management decisions. Negative results must be combined with clinical observations, patient history, and epidemiological information. The expected result is Negative.  Fact Sheet for Patients: SugarRoll.be  Fact Sheet for Healthcare Providers: https://www.woods-mathews.com/  This test is not yet approved or cleared by the [place] FDA and  has been authorized for detection and/or diagnosis of SARS-CoV-2 by FDA under an Emergency Use Authorization (EUA). This EUA will remain  in effect (meaning this test can be used) for the duration of the COVID-19 declaration under Se ction 564(b)(1) of the Act, 21 U.S.C. section 360bbb-3(b)(1), unless the authorization is  terminated or revoked sooner.  Performed at [facility], [place] [address] [place], [place] [address]   SARS CORONAVIRUS 2 (TAT 6-24 HRS) Nasopharyngeal Nasopharyngeal Swab     Status: None   Collection Time: 09/02/2020  4:36 PM   Specimen: Nasopharyngeal Swab  Result Value Ref Range Status   SARS Coronavirus 2 NEGATIVE NEGATIVE Final    Comment: (NOTE) SARS-CoV-2 target nucleic acids are NOT DETECTED.  The SARS-CoV-2 RNA is generally detectable in upper and lower respiratory specimens during the acute phase of infection. Negative results do not preclude SARS-CoV-2 infection, do not rule out co-infections with other pathogens, and should not be used as the sole basis for treatment or other patient management decisions. Negative results must be combined with clinical observations, patient history, and epidemiological information. The expected result is Negative.  Fact Sheet for Patients: SugarRoll.be  Fact Sheet for Healthcare Providers: https://www.woods-mathews.com/  This test is not yet approved or cleared by the [place] FDA and  has been authorized for detection and/or diagnosis of SARS-CoV-2 by FDA under an Emergency Use Authorization (EUA). This EUA will remain  in effect (meaning this test can be used) for the duration of the COVID-19 declaration under Se ction 564(b)(1) of the Act, 21 U.S.C. section 360bbb-3(b)(1), unless the authorization is terminated or revoked sooner.  Performed at [facility], [place] [address], [place], [place] [address]      Radiology Studies: No results found. US Renal  Final Result      Scheduled Meds: . (feeding supplement) PROSource Plus  30 mL Oral BID BM   PRN Meds: acetaminophen **OR** acetaminophen, camphor-menthol **AND** hydrOXYzine, fentaNYL (SUBLIMAZE) injection, LORazepam, ondansetron **OR** ondansetron (ZOFRAN) IV, triamcinolone Continuous Infusions:    LOS: 6 days   Time spent: Greater than 50% of the 35 minute visit was spent in counseling/coordination of care for the patient as laid out in the A&P.   Dwyane Dee, MD [facility] 08/28/2020, 2:52 PM

## 2020-08-28 NOTE — Progress Notes (Signed)
Daily Progress Note   Patient Name: Stephen Grimes       Date: 08/28/2020 DOB: Nov 25, 1950  Age: 70 y.o. MRN#: [id number] Attending Physician: Dwyane Dee, MD Primary Care Physician: Seward Carol, MD Admit Date: 08/17/2020  Reason for Consultation/Follow-up: Establishing goals of care  Subjective: Patient is lethargic, ongoing functional and cognitive decline since the past 24 hours. Niece and wife arrived at bedside, family meeting to re discuss goals of care and to establish a focus on comfort measures was undertaken.    Length of Stay: 6  Current Medications: Scheduled Meds:  . (feeding supplement) PROSource Plus  30 mL Oral BID BM    Continuous Infusions:   PRN Meds: acetaminophen **OR** acetaminophen, camphor-menthol **AND** hydrOXYzine, fentaNYL (SUBLIMAZE) injection, LORazepam, ondansetron **OR** ondansetron (ZOFRAN) IV, triamcinolone  Physical Exam         Chronically ill-appearing Not awake not alert Irregularly irregular Shallow regular breath sounds Abdomen is distended has a palpable mass Does not have edema Has some muscle wasting  Vital Signs: BP (!) 100/59 (BP Location: Right Arm)   Pulse (!) 164   Temp 98.6 F (37 C) (Oral)   Resp (!) 22   Ht 5\' 7"  (1.702 m)   Wt 68.6 kg   SpO2 95%   BMI 23.69 kg/m  SpO2: SpO2: 95 % O2 Device: O2 Device: Room Air O2 Flow Rate:    Intake/output summary:   Intake/Output Summary (Last 24 hours) at 08/28/2020 1307 Last data filed at 08/28/2020 0600 Gross per 24 hour  Intake 1674.61 ml  Output 800 ml  Net 874.61 ml   LBM: Last BM Date: 08/27/20 Baseline Weight: Weight: 59.9 kg Most recent weight: Weight: 68.6 kg       Palliative Assessment/Data:      Patient Active Problem List   Diagnosis Date Noted  .  Protein calorie malnutrition ([place]) 08/25/2020  . History of cardiomyopathy 08/25/2020  . COPD (chronic obstructive pulmonary disease) ([place]) 08/25/2020  . Normocytic anemia 08/25/2020  . Irregular cardiac rhythm 08/25/2020  . Acute renal failure superimposed on stage 3b chronic kidney disease ([place]) 08/17/2020  . Hepatic encephalopathy ([place]) 11/23/2018  . Palliative care by specialist   . Goals of care, counseling/discussion   . Encephalopathy   . AKI (acute kidney injury) ([place])   . Acute congestive heart  failure ([place])   . Metastatic renal cell carcinoma ([place])   . Acute respiratory failure ([place]) 11/16/2018  . HCAP (healthcare-associated pneumonia) 09/12/2018  . Hyponatremia 09/12/2018  . Acute metabolic encephalopathy 91/63/8466  . Sepsis ([place]) 09/12/2018  . Cirrhosis of liver ([place]) 06/10/2018  . Encounter for long-term (current) use of high-risk medication 06/10/2018  . Thrombocytopathia ([place]) 06/10/2018  . Metastatic renal cell carcinoma to lymph node ([place]) 08/31/2016  . Alcoholic cirrhosis of liver without ascites ([place]) 03/19/2016  . ED (erectile dysfunction) 03/19/2016  . Malignant neoplasm of unspecified kidney, except renal pelvis ([place]) 03/19/2016  . AKI (acute kidney injury) ([place]) 07/02/2014  . Essential (primary) hypertension 07/02/2014  . Hypothyroidism, unspecified 07/02/2014  . Thrombocytopenia ([place]) 12/11/2011    Palliative Care Assessment & Plan   Patient Profile:    Assessment: 70 year old gentleman with right-sided renal cell carcinoma status post nephrectomy.  Patient also has history of liver cirrhosis, coronary artery disease and hypertension.  Previous history of alcoholism.  Has a history of COPD and hypothyroidism.  Patient follows with [facility] for his cancer care reportedly has been told recently that he was not benefiting anymore from chemotherapy.  And admitted to the hospital with creatinine of 5 and potassium of 6.5.  Patient is being followed by renal  colleagues.  Patient was also seen by palliative services in the outpatient setting.  Palliative medicine team consulted here in the hospital for ongoing goals of care discussions.  Recommendations/Plan:  Goals of care discussions:  DNR DNI  Comfort measures  D.C labs  D/C medications not directly contributing to comfort care.   Prognosis hours to some very limited number of days: anticipate hospital death. Patient rapidly declining, not safe to transport to residential hospice, in my opinion.   Chaplain consult.   PRN IV Fentanyl since the patient has renal failure  PRN IV Ativan for agitation.       PMT to follow.   Code Status:    Code Status Orders  (From admission, onward)         Start     Ordered   08/27/20 1033  Do not attempt resuscitation (DNR)  Continuous       Question Answer Comment  In the event of cardiac or respiratory ARREST Do not call a "code blue"   In the event of cardiac or respiratory ARREST Do not perform Intubation, CPR, defibrillation or ACLS   In the event of cardiac or respiratory ARREST Use medication by any route, position, wound care, and other measures to relive pain and suffering. May use oxygen, suction and manual treatment of airway obstruction as needed for comfort.      08/27/20 1035        Code Status History    Date Active Date Inactive Code Status Order ID Comments User Context   08/08/2020 2337 08/27/2020 1035 Full Code [id number]  Elwyn Reach, MD ED   11/19/2018 1012 11/23/2018 1932 DNR [id number]  Cristal Generous, NP Inpatient   11/16/2018 1452 11/19/2018 1012 Full Code [id number]  Melvenia Needles, NP ED   09/12/2018 2224 09/18/2018 1626 Full Code [id number]  Bonnielee Haff, MD ED   Advance Care Planning Activity       Prognosis:   hours to days.   Discharge Planning:  Anticipated hospital death  Care plan was discussed with  Patient and wife and niece. Also discussed with RN. Appreciate their help. Discussed with Dr  Sabino Gasser.   Thank you for allowing the Palliative  Medicine Team to assist in the care of this patient.   Time In: 12.30 Time Out: 1305 Total Time 35 Prolonged Time Billed no      Greater than 50%  of this time was spent counseling and coordinating care related to the above assessment and plan.  Loistine Chance, MD  Please contact Palliative Medicine Team phone at [phone] for questions and concerns.

## 2020-08-28 NOTE — Progress Notes (Signed)
Patient ID: ESAUL DORWART, male   DOB: 04-27-1951, 70 y.o.   MRN: [id number] S: Very lethargic and confused this morning. O:BP 91/64 (BP Location: Left Arm)   Pulse 100   Temp 97.6 F (36.4 C) (Oral)   Resp 20   Ht 5\' 7"  (1.702 m)   Wt 68.6 kg   SpO2 94%   BMI 23.69 kg/m   Intake/Output Summary (Last 24 hours) at 08/28/2020 0919 Last data filed at 08/28/2020 0600 Gross per 24 hour  Intake 1914.61 ml  Output 800 ml  Net 1114.61 ml   Intake/Output: I/O last 3 completed shifts: In: 2274.6 [P.O.:1120; I.V.:1154.6] Out: 1120 [Urine:1120]  Intake/Output this shift:  No intake/output data recorded. Weight change: 0.379 kg Gen: chronically ill-appearing, lethargic, confused, weak CVS: tachy at 100 Resp: poor inspiratory effort Abd: distended, +BS, mildly tender Ext: trace pretibial edema bilaterally  Recent Labs  Lab 07/23/2020 1733 08/19/2020 2053 08/20/2020 2338 08/27/2020 0338 09/04/2020 0505 09/11/2020 0619 08/28/2020 1155 08/24/20 0447 08/25/20 0408 08/26/20 0403 08/27/20 0510 08/28/20 0453  NA 130* 130*  --   --  131*  --   --  131* 133* 133* 134* 135  K 6.5* 6.1* 5.6*   < > 5.3* 4.9 5.0 4.6 3.9 3.7 3.6 3.3*  CL 100 101  --   --  97*  --   --  98 100 101 101 97*  CO2 15* 14*  --   --  20*  --   --  19* 19* 18* 18* 21*  GLUCOSE 106* 113*  --   --  141*  --   --  116* 108* 113* 102* 125*  BUN 105* 92*  --   --  84*  --   --  92* 88* 97* 98* 107*  CREATININE 5.09* 5.02* 4.77*  --  4.01*  --   --  4.25* 4.25* 4.24* 4.32* 4.28*  ALBUMIN 2.6*  --   --   --  2.3*  --   --  2.3* 2.1* 2.1* 2.0* 2.0*  CALCIUM 8.7* 8.6*  --   --  8.3*  --   --  8.3* 8.1* 8.1* 8.1* 8.3*  PHOS  --   --   --   --   --   --   --  6.8* 7.1* 6.4* 6.2* 5.6*  AST 73*  --   --   --  57*  --   --   --   --   --   --   --   ALT 22  --   --   --  24  --   --   --   --   --   --   --    < > = values in this interval not displayed.   Liver Function Tests: Recent Labs  Lab 08/02/2020 1733 09/03/2020 0505  08/24/20 0447 08/26/20 0403 08/27/20 0510 08/28/20 0453  AST 73* 57*  --   --   --   --   ALT 22 24  --   --   --   --   ALKPHOS 150* 137*  --   --   --   --   BILITOT 0.7 0.7  --   --   --   --   PROT 6.9 6.1*  --   --   --   --   ALBUMIN 2.6* 2.3*   < > 2.1* 2.0* 2.0*   < > = values in this  interval not displayed.   No results for input(s): LIPASE, AMYLASE in the last 168 hours. No results for input(s): AMMONIA in the last 168 hours. CBC: Recent Labs  Lab 08/24/20 0447 08/25/20 0408 08/26/20 0403 08/27/20 0510 08/28/20 0453  WBC 11.9* 13.1* 12.9* 13.6* 14.8*  NEUTROABS  --   --  10.0* 10.4* 12.2*  HGB 8.8* 8.4* 7.7* 7.7* 8.6*  HCT 26.8* 25.5* 23.6* 23.5* 27.2*  MCV 84.8 85.6 85.8 86.1 86.3  PLT 339 352 334 307 394   Cardiac Enzymes: No results for input(s): CKTOTAL, CKMB, CKMBINDEX, TROPONINI in the last 168 hours. CBG: Recent Labs  Lab 08/04/2020 2050 08/26/20 0729 08/26/20 1101 08/26/20 1654  GLUCAP 104* 100* 97 101*    Iron Studies: No results for input(s): IRON, TIBC, TRANSFERRIN, FERRITIN in the last 72 hours. Studies/Results: No results found. . (feeding supplement) PROSource Plus  30 mL Oral BID BM  . aspirin  81 mg Oral Q breakfast  . heparin  5,000 Units Subcutaneous Q8H  . levothyroxine  100 mcg Oral QAC breakfast  . pantoprazole  40 mg Oral Daily  . potassium chloride  20 mEq Oral Once  . sevelamer carbonate  800 mg Oral TID WC  . thiamine  100 mg Oral Daily    BMET    Component Value Date/Time   NA 135 08/28/2020 0453   K 3.3 (L) 08/28/2020 0453   CL 97 (L) 08/28/2020 0453   CO2 21 (L) 08/28/2020 0453   GLUCOSE 125 (H) 08/28/2020 0453   BUN 107 (H) 08/28/2020 0453   CREATININE 4.28 (H) 08/28/2020 0453   CALCIUM 8.3 (L) 08/28/2020 0453   GFRNONAA 14 (L) 08/28/2020 0453   GFRAA >60 11/22/2018 0518   CBC    Component Value Date/Time   WBC 14.8 (H) 08/28/2020 0453   RBC 3.15 (L) 08/28/2020 0453   HGB 8.6 (L) 08/28/2020 0453   HGB 13.8  12/11/2011 1531   HCT 27.2 (L) 08/28/2020 0453   HCT 32.0 (L) 09/16/2018 0635   HCT 40.3 12/11/2011 1531   PLT 394 08/28/2020 0453   PLT 64 (L) 12/11/2011 1531   MCV 86.3 08/28/2020 0453   MCV 99.5 (H) 12/11/2011 1531   MCH 27.3 08/28/2020 0453   MCHC 31.6 08/28/2020 0453   RDW 18.6 (H) 08/28/2020 0453   RDW 13.3 12/11/2011 1531   LYMPHSABS 0.8 08/28/2020 0453   LYMPHSABS 1.5 12/11/2011 1531   MONOABS 1.6 (H) 08/28/2020 0453   MONOABS 0.6 12/11/2011 1531   EOSABS 0.0 08/28/2020 0453   EOSABS 0.0 12/11/2011 1531   BASOSABS 0.0 08/28/2020 0453   BASOSABS 0.0 12/11/2011 1531     Assessment/Plan: 1. Altered mental status - this is new.  He is very lethargic and confused.  Minimally responsive.  Does not appear that he received any pain meds this am.  Possible infection.  Will send for urine culture and sensitivity as well as UA.  Further workup per primary. 2. AKI/CKD stage IV-is normally followed by Nephrology at [facility] (Dr. Smith Mince) who has noted that his baseline Scr has been climbing over the past year from 1.6-1.9 up to 2-2.5 prior to his hospitalization. The AKI/CKD stage IV is presumablydue toischemic ATN in setting of poor po intake and concomitant ARB and diuretic use. BUN/Cr improvedinitiallyafter holding ARB and diuretics andgiving IVF's however it has reached a plateau around 4.25. 1. poor po intake  2. IVF's restarted 08/27/20, however BUN continues to climb. 3. Would not resume ARB due to risks  outweighing benefits 4. No indication for dialysis at this time and will continue to follow. 5. Appreciatepalliative care consult to help set goals/limits of care as I do not think he would do well with HD. I did discuss HD with him and what to expect, mainly worsening functional status and fatigue. He appears a little depressed about his current health issues. 6. He is not a suitable candidate for outpatient hemodialysis given his rapidly declining functional and nutritional  status as well as his progressive metastatic cancer.  Would not offer RRT.  Discussed with Palliative Care who are in agreement. 3. Metastatic renal cell carcinoma- failing current chemo and was taken off 08/15/20 with plan to try axitinib, however now with worsening renal function. Palliative care is following patient. 4. Cirrhosis 5. Hyperkalemia- improved with IVF's and holding ARB 6. Anemia of malignancy/chemo- transfuse prn, no ESA 7. Moderate protein malnutrition- with cirrhosis and metastatic cancer 8. HTN- stable 9. Hyponatremia- multifactorial, edema, AKI. Improving with IVF's, cont to follow. 10. Disposition- pt with metastatic renal cell carcinoma with progressive disease despite ongoing chemo. Appreciate palliative care consult to help set goals/limits of care, he is now DNR.   Donetta Potts, MD [employer] [phone]

## 2020-08-28 NOTE — Plan of Care (Signed)
  Problem: Safety: Goal: Ability to remain free from injury will improve Outcome: Progressing   Problem: Nutrition: Goal: Adequate nutrition will be maintained Outcome: Progressing   Problem: Safety: Goal: Ability to remain free from injury will improve Outcome: Progressing

## 2020-08-29 DIAGNOSIS — C641 Malignant neoplasm of right kidney, except renal pelvis: Secondary | ICD-10-CM

## 2020-08-29 DIAGNOSIS — N179 Acute kidney failure, unspecified: Secondary | ICD-10-CM | POA: Diagnosis not present

## 2020-08-29 DIAGNOSIS — N1832 Chronic kidney disease, stage 3b: Secondary | ICD-10-CM | POA: Diagnosis not present

## 2020-08-29 DIAGNOSIS — G9349 Other encephalopathy: Secondary | ICD-10-CM | POA: Diagnosis not present

## 2020-08-31 ENCOUNTER — Ambulatory Visit: Admit: 2020-08-31 | Payer: MEDICARE

## 2020-08-31 ENCOUNTER — Ambulatory Visit: Admit: 2020-08-31 | Payer: MEDICARE | Attending: Family | Primary: Family

## 2020-09-20 NOTE — Discharge Summary (Signed)
Death Summary  Stephen Grimes DVV:[id number] DOB: 1951/02/18 DOA: 09/04/2020  PCP: Seward Carol, MD  Admit date: 09/04/2020 Date of Death: 09-11-20 Time of Death: 0400 Notification: Seward Carol, MD notified of death of 09/11/20   History of present illness:  Stephen Grimes is a 70 y.o. male with medical history significant of right renal cell carcinoma status post right nephrectomy 6 years ago, liver cirrhosis, coronary artery disease, essential hypertension, previous history of of alcoholism, hypothyroidism and COPD who has been doing well after his nephrectomy and chemotherapy for his renal cell carcinoma , was told that he was failing chemotherapy, was being evaluated for EGD and echocardiogram in preparation for new chemo, and his labs revealed creatinine of 4.  He was sent to ED for further evaluation.  On arrival to ED he was found to have a creatinine of 5 and hyperkalemic with a potassium of 6.5 He was referred to [facility] for admission .  Nephrology consulted for further recommendations.  Ultrasound of the renal did not show any obstructive pathology. Given his multiple comorbidities with underlying malignancy, palliative care was consulted.  He initially expressed wishes to remain full code with full measures for treatment. As hospitalization progressed and his quality of life began to decrease along with no significant recovery of renal function, goals of care were again discussed on 08/27/2020 with palliative care.  At that time, he elected for transitioning to a DNR/DNI.  He still wished to continue treatment medically as able. He again was evaluated by nephrology, and not felt to be a good dialysis candidate given his deteriorating functional status and underlying aggressive malignancy. Plan will be to continue fluids as he can tolerate and monitor renal function.  On 2/6, patient appeared to have declined even further. His renal function continued to worsen and he became more  lethargic and confused. GOC were again discussed with family and ultimately since patient appears to be approaching end of life, the goal became to pursue comfort; with an in-hospital death expected as patient was not considered stable enough for discharging to residential hospice.   Family was able to visit and comfort care was continued.  Patient continued to decline and passed naturally at 0400 on 09-11-2020.   Final Diagnoses:  Acute on chronic kidney disease stage IIIb Stage IV Renal cell carcinoma Uremic encephalopathy    The results of significant diagnostics from this hospitalization (including imaging, microbiology, ancillary and laboratory) are listed below for reference.    Significant Diagnostic Studies: US Renal  Result Date: 09/04/20 CLINICAL DATA:  Acute renal failure, history of right nephrectomy for renal malignancy, history of hypertension EXAM: RENAL / URINARY TRACT ULTRASOUND COMPLETE COMPARISON:  Ultrasound 06/22/2020, CT 01/12/2019 FINDINGS: Right Kidney: Surgically absent. No gross abnormality in the right nephrectomy bed aside from the presence of upper abdominal ascites. Left Kidney: Renal measurements: 11.2 x 5.2 x 5.9 cm = volume: 180.6 mL. Echogenicity is within normal limits. No concerning renal mass, shadowing calculus or hydronephrosis. Bladder: Some mild bladder wall thickening is nonspecific, could correlate for features of cystitis. Patient noted a were unable to void despite the prevoid volume of 84 mL. Other: Moderate volume ascites throughout the abdomen. Cirrhotic morphology of the imaged portions of the liver tip. IMPRESSION: 1. Status post right nephrectomy. No gross abnormality in the right nephrectomy bed. 2. Unremarkable sonographic appearance of the left kidney. 3. Mild bladder wall thickening is nonspecific, correlate for features of cystitis. 4. Patient noted in inability to void despite a  prevoid volume of 84 mL. Correlate for clinical features of  urinary retention. 5. Moderate volume ascites throughout the abdomen. Cirrhotic appearance of the included portions of the liver. Electronically Signed   By: Lovena Le M.D.   On: 08/18/2020 20:39    Microbiology: Recent Results (from the past 240 hour(s))  SARS CORONAVIRUS 2 (TAT 6-24 HRS) Nasopharyngeal Nasopharyngeal Swab     Status: None   Collection Time: 09/10/2020  4:36 PM   Specimen: Nasopharyngeal Swab  Result Value Ref Range Status   SARS Coronavirus 2 NEGATIVE NEGATIVE Final    Comment: (NOTE) SARS-CoV-2 target nucleic acids are NOT DETECTED.  The SARS-CoV-2 RNA is generally detectable in upper and lower respiratory specimens during the acute phase of infection. Negative results do not preclude SARS-CoV-2 infection, do not rule out co-infections with other pathogens, and should not be used as the sole basis for treatment or other patient management decisions. Negative results must be combined with clinical observations, patient history, and epidemiological information. The expected result is Negative.  Fact Sheet for Patients: SugarRoll.be  Fact Sheet for Healthcare Providers: https://www.woods-mathews.com/  This test is not yet approved or cleared by the [place] FDA and  has been authorized for detection and/or diagnosis of SARS-CoV-2 by FDA under an Emergency Use Authorization (EUA). This EUA will remain  in effect (meaning this test can be used) for the duration of the COVID-19 declaration under Se ction 564(b)(1) of the Act, 21 U.S.C. section 360bbb-3(b)(1), unless the authorization is terminated or revoked sooner.  Performed at [facility], [place] [address], [place], [place] [address]      Labs: Basic Metabolic Panel: Recent Labs  Lab 08/24/20 0447 08/25/20 0408 08/26/20 0403 08/27/20 0510 08/28/20 0453  NA 131* 133* 133* 134* 135  K 4.6 3.9 3.7 3.6 3.3*  CL 98 100 101 101 97*  CO2 19* 19* 18* 18*  21*  GLUCOSE 116* 108* 113* 102* 125*  BUN 92* 88* 97* 98* 107*  CREATININE 4.25* 4.25* 4.24* 4.32* 4.28*  CALCIUM 8.3* 8.1* 8.1* 8.1* 8.3*  MG  --   --  2.7* 2.6* 2.8*  PHOS 6.8* 7.1* 6.4* 6.2* 5.6*   Liver Function Tests: Recent Labs  Lab 08/14/2020 1733 09/16/2020 0505 08/24/20 0447 08/25/20 0408 08/26/20 0403 08/27/20 0510 08/28/20 0453  AST 73* 57*  --   --   --   --   --   ALT 22 24  --   --   --   --   --   ALKPHOS 150* 137*  --   --   --   --   --   BILITOT 0.7 0.7  --   --   --   --   --   PROT 6.9 6.1*  --   --   --   --   --   ALBUMIN 2.6* 2.3* 2.3* 2.1* 2.1* 2.0* 2.0*   No results for input(s): LIPASE, AMYLASE in the last 168 hours. No results for input(s): AMMONIA in the last 168 hours. CBC: Recent Labs  Lab 08/24/20 0447 08/25/20 0408 08/26/20 0403 08/27/20 0510 08/28/20 0453  WBC 11.9* 13.1* 12.9* 13.6* 14.8*  NEUTROABS  --   --  10.0* 10.4* 12.2*  HGB 8.8* 8.4* 7.7* 7.7* 8.6*  HCT 26.8* 25.5* 23.6* 23.5* 27.2*  MCV 84.8 85.6 85.8 86.1 86.3  PLT 339 352 334 307 394   Cardiac Enzymes: No results for input(s): CKTOTAL, CKMB, CKMBINDEX, TROPONINI in the last  168 hours. D-Dimer No results for input(s): DDIMER in the last 72 hours. BNP: Invalid input(s): POCBNP CBG: Recent Labs  Lab 08/01/2020 2050 08/26/20 0729 08/26/20 1101 08/26/20 1654  GLUCAP 104* 100* 97 101*   Anemia work up No results for input(s): VITAMINB12, FOLATE, FERRITIN, TIBC, IRON, RETICCTPCT in the last 72 hours. Urinalysis    Component Value Date/Time   COLORURINE YELLOW 11/16/2018 1629   APPEARANCEUR CLEAR 11/16/2018 1629   LABSPEC 1.006 11/16/2018 1629   PHURINE 5.0 11/16/2018 1629   GLUCOSEU NEGATIVE 11/16/2018 1629   HGBUR SMALL (A) 11/16/2018 1629   BILIRUBINUR NEGATIVE 11/16/2018 [place] 11/16/2018 1629   PROTEINUR 30 (A) 11/16/2018 1629   NITRITE NEGATIVE 11/16/2018 1629   LEUKOCYTESUR NEGATIVE 11/16/2018 1629   Sepsis Labs Invalid input(s):  PROCALCITONIN,  WBC,  LACTICIDVEN     SIGNED:  Dwyane Dee, MD  [facility] 2020-09-11, 4:33 PM

## 2020-09-20 DEATH — deceased

## 2021-04-17 IMAGING — CT CT ABDOMEN AND PELVIS WITH CONTRAST
1 of 3 series · 13 of 32 positions shown, 18 images · IV contrast (APPLIED)
Comparison: 07/20/2014 CT abdomen/pelvis.

CLINICAL DATA: Alcoholic cirrhosis. Generalized abdominal pain.
History of right nephrectomy for kidney cancer.

EXAM:
CT ABDOMEN AND PELVIS WITH CONTRAST
TECHNIQUE: Multidetector CT imaging of the abdomen and pelvis was performed
using the standard protocol following bolus administration of
intravenous contrast.
CONTRAST:  100mL AVGDQ6-F55 IOPAMIDOL (AVGDQ6-F55) INJECTION 61%

[Series 2: abd/pelvis w/cm · axial · 0.72mm/px · z∈[-496,-141]mm · 13 of 83 slices shown, 18 images]
[im 6/83  soft-tissue]
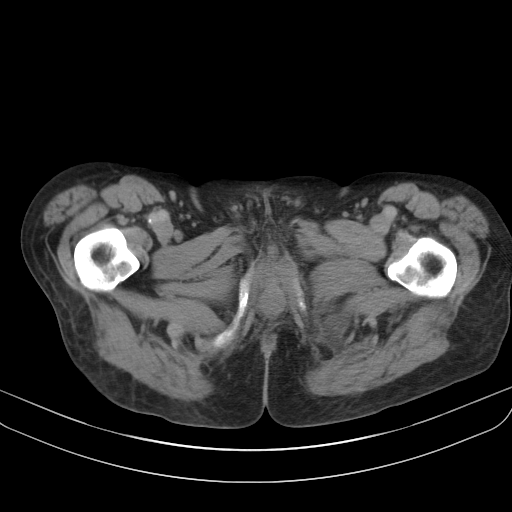
[im 6/83  bone]
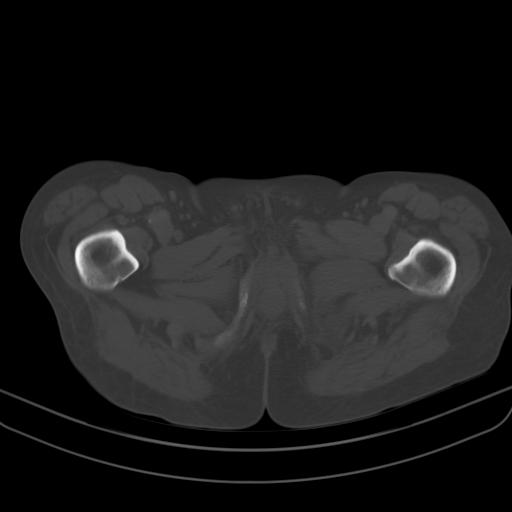
[im 11/83  soft-tissue]
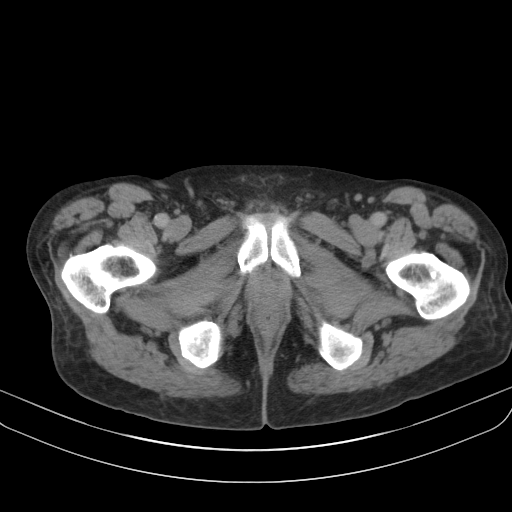
[im 21/83  soft-tissue]
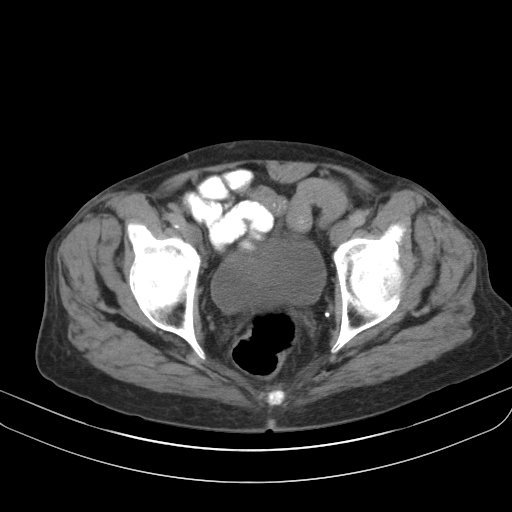
[im 26/83  soft-tissue]
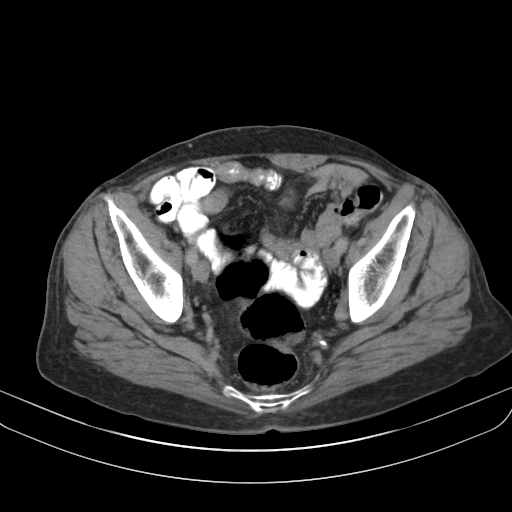
[im 31/83  soft-tissue]
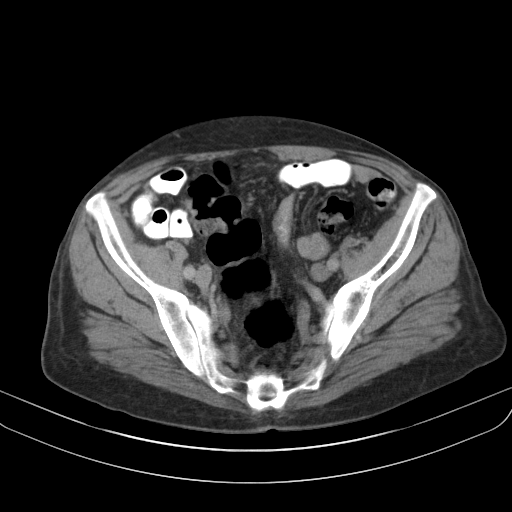
[im 36/83  soft-tissue]
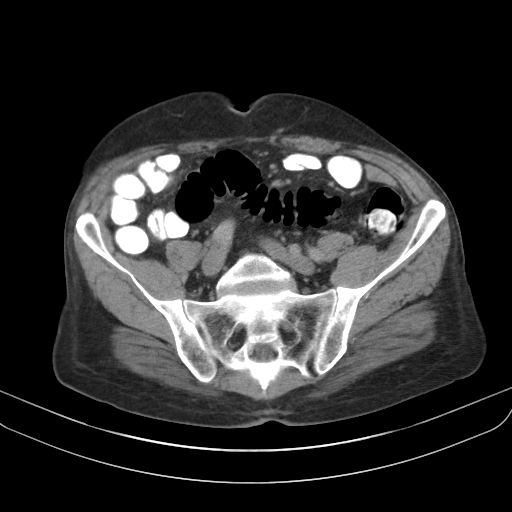
[im 47/83  soft-tissue]
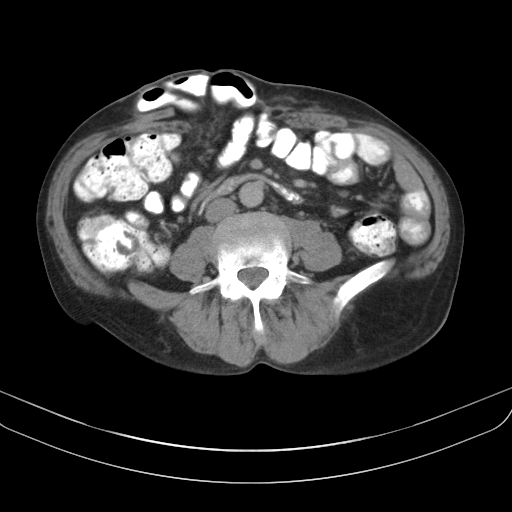
[im 52/83  soft-tissue]
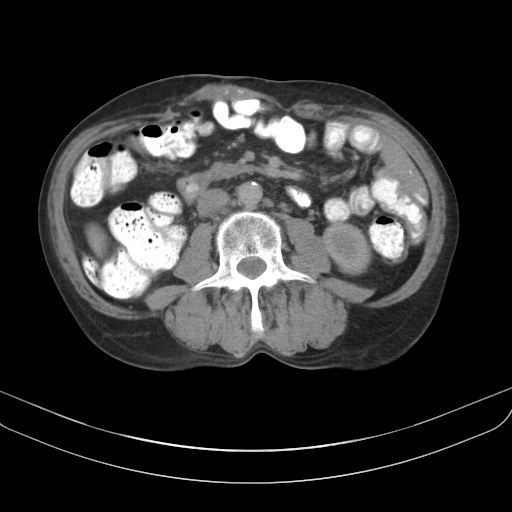
[im 57/83  soft-tissue]
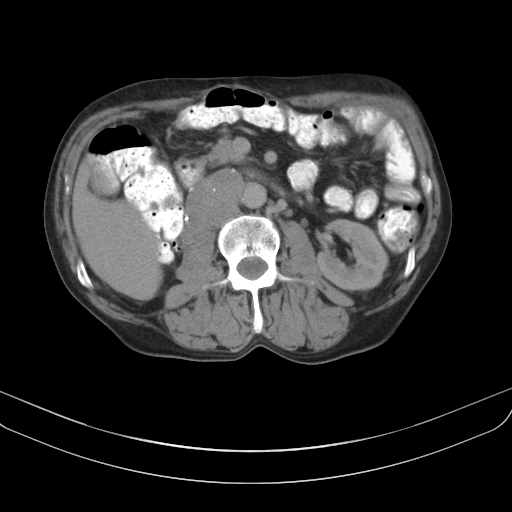
[im 57/83  bone]
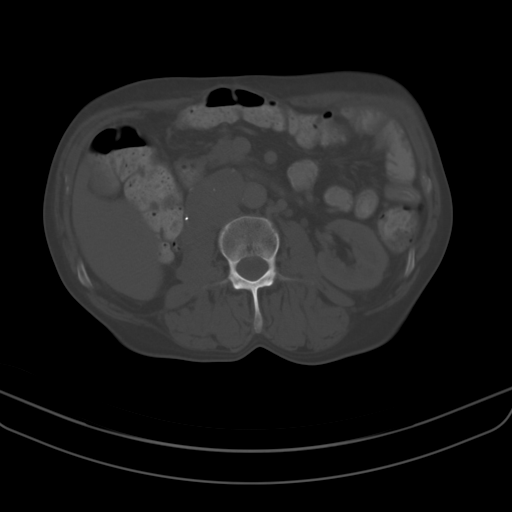
[im 62/83  soft-tissue]
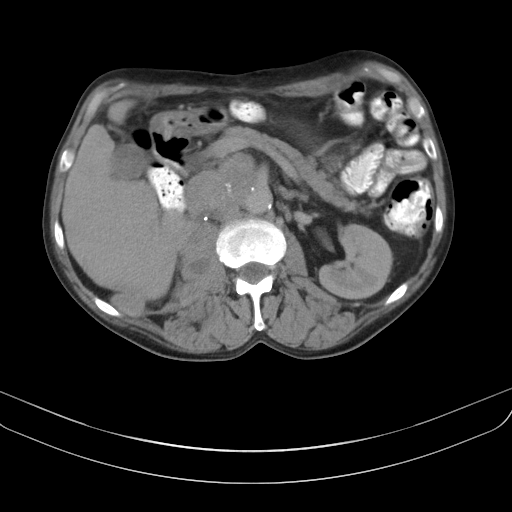
[im 62/83  lung]
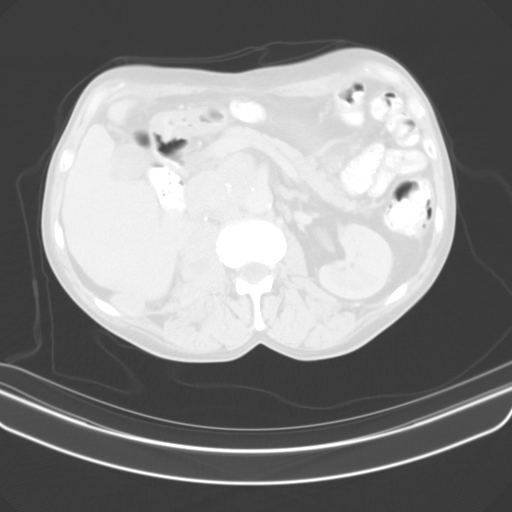
[im 67/83  lung]
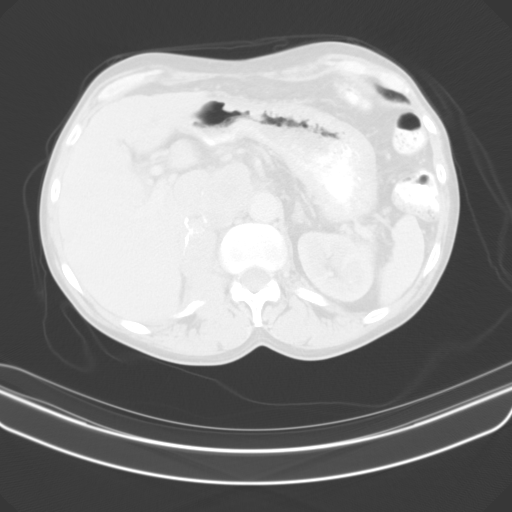
[im 72/83  soft-tissue]
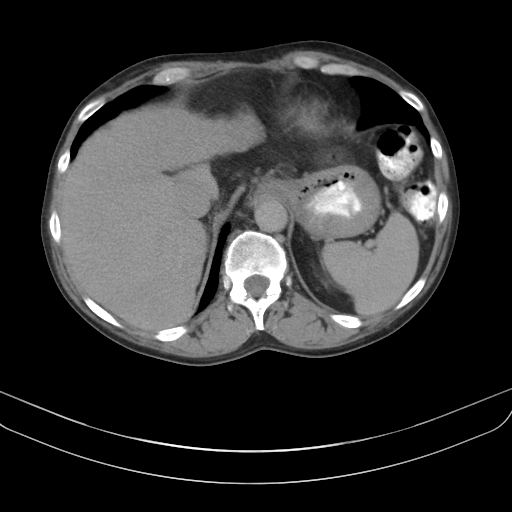
[im 72/83  lung]
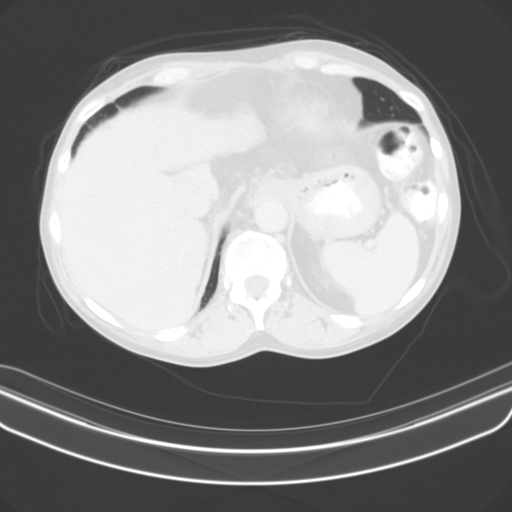
[im 77/83  soft-tissue]
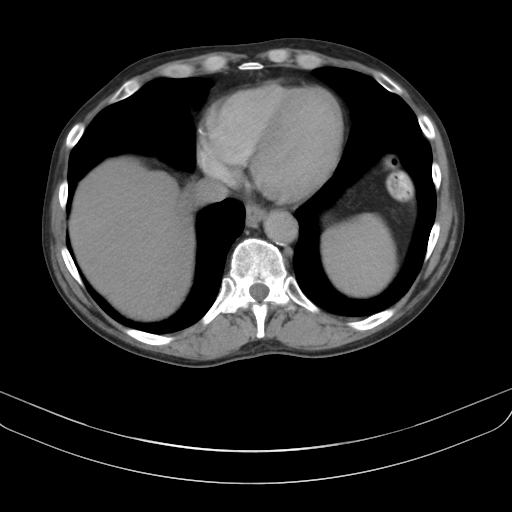
[im 77/83  lung]
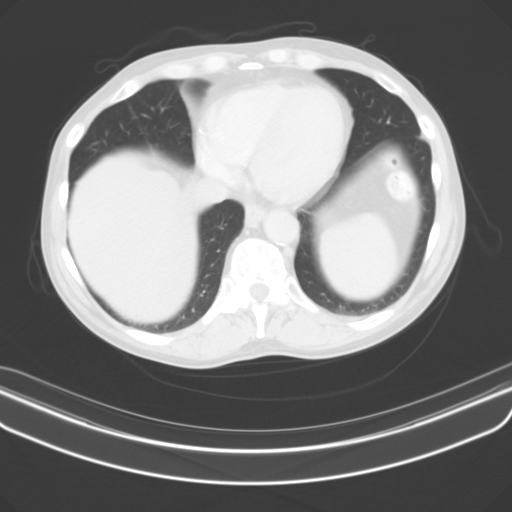

[13 of 32 positions shown; findings below may reference images not displayed]

FINDINGS: Lower chest: No significant pulmonary nodules or acute consolidative
airspace disease. Right coronary atherosclerosis.

Hepatobiliary: Diffusely irregular liver surface compatible with
cirrhosis. There is a new 2.6 x 1.9 cm right retroperitoneal mass
abutting the posterior right liver capsule (series 2/image 22). No
intrinsic liver mass. Normal gallbladder with no radiopaque
cholelithiasis. No biliary ductal dilatation.

Pancreas: Normal, with no mass or duct dilation.

Spleen: Normal size. No mass.

Adrenals/Urinary Tract: No discrete adrenal nodules. Status post
right nephrectomy. There are new infiltrative confluent right
nephrectomy bed masses with heterogeneous enhancement measuring
x 3.2 cm anteriorly in the right paraspinal region (series 2/image
21) and measuring 3.8 x 2.8 cm posteriorly along the inferior margin
of the right twelfth rib (series 2/image 18). Normal size left
kidney with no left hydronephrosis. Subcentimeter hypodense
interpolar left renal cortical lesion is too small to characterize
and requires no follow-up. Normal bladder.

Stomach/Bowel: Normal non-distended stomach. Large ventral right
abdominal hernia contains multiple small bowel loops, with no
significant small bowel dilatation, pneumatosis, wall thickening or
focal caliber transition. Normal appendix. Oral contrast transits to
the left colon. Mild left colonic diverticulosis, with no large
bowel wall thickening or significant pericolonic fat stranding.

Vascular/Lymphatic: Atherosclerotic nonaneurysmal abdominal aorta.
Patent portal, splenic, hepatic and renal veins. New infiltrative
aortocaval adenopathy with heterogeneous enhancement, measuring
cm short axis diameter (series 2/image 17). New infiltrative right
retrocaval adenopathy measuring 2.8 cm short axis diameter (series
2/image 26). No pelvic adenopathy.

Reproductive: Normal size prostate.

Other: No pneumoperitoneum, ascites or focal fluid collection.

Musculoskeletal: No aggressive appearing focal osseous lesions. Mild
lumbar spondylosis.
IMPRESSION: 1. New infiltrative confluent right nephrectomy bed masses with
heterogeneous enhancement, compatible with local tumor recurrence,
including along the posterior right liver capsule.
2. New bulky infiltrative right retroperitoneal adenopathy
compatible with metastatic disease.
3. Hepatic cirrhosis.  No ascites.  Normal size spleen.
4. Large ventral right abdominal hernia contains small bowel loops,
with no acute bowel complication. Mild left colonic diverticulosis.
5.  Aortic Atherosclerosis ([id number]).

## 2022-09-26 IMAGING — US US ABDOMEN LIMITED
1 series · 14 of 25 positions shown · non-contrast
Comparison: CT abdomen pelvis January 12, 2019

CLINICAL DATA: Right upper quadrant abdominal pain

EXAM:
ULTRASOUND ABDOMEN LIMITED RIGHT UPPER QUADRANT

[Series 1: us abdomen limited · 0.23mm/px · 50 acquisitions, 14 frames shown]
[im 1/50]
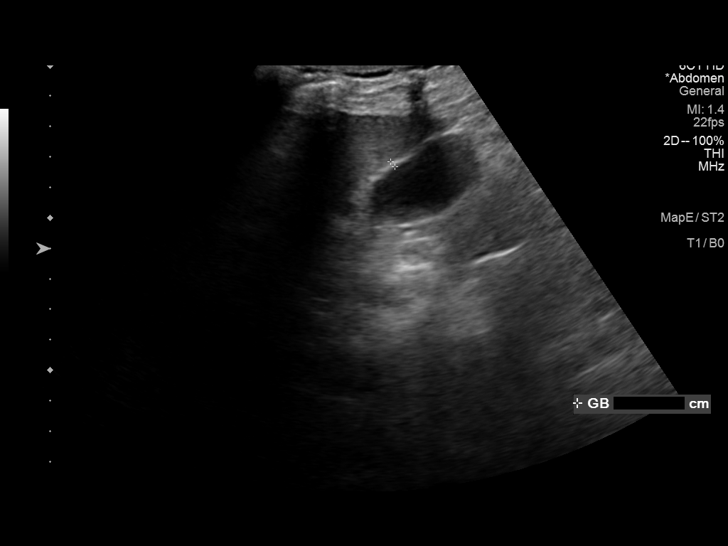
[im 5/50]
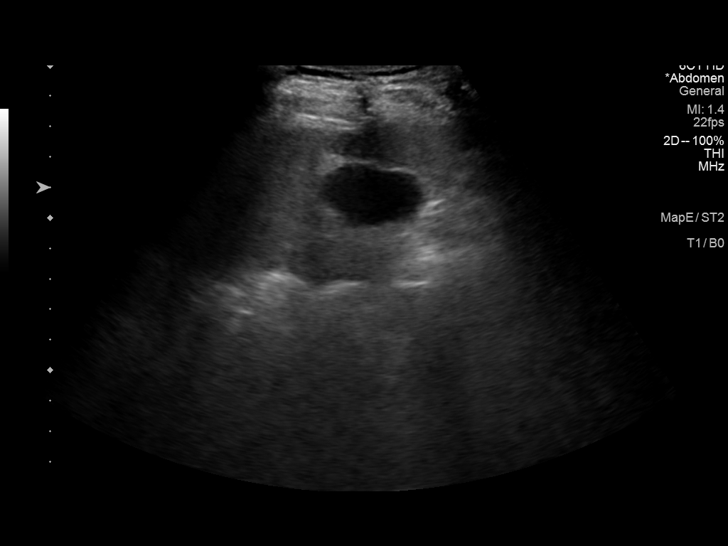
[im 9/50]
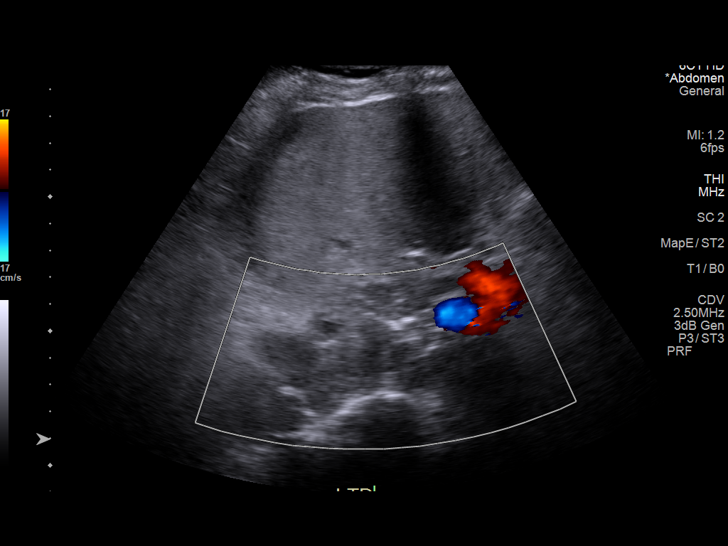
[im 13/50]
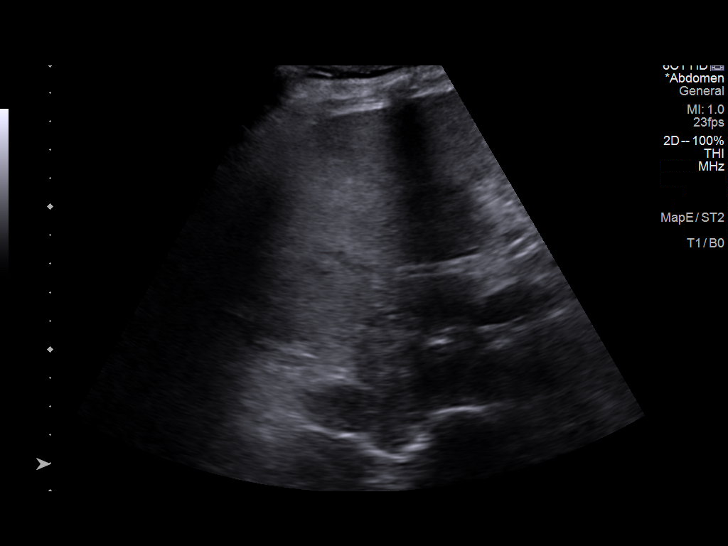
[im 17/50]
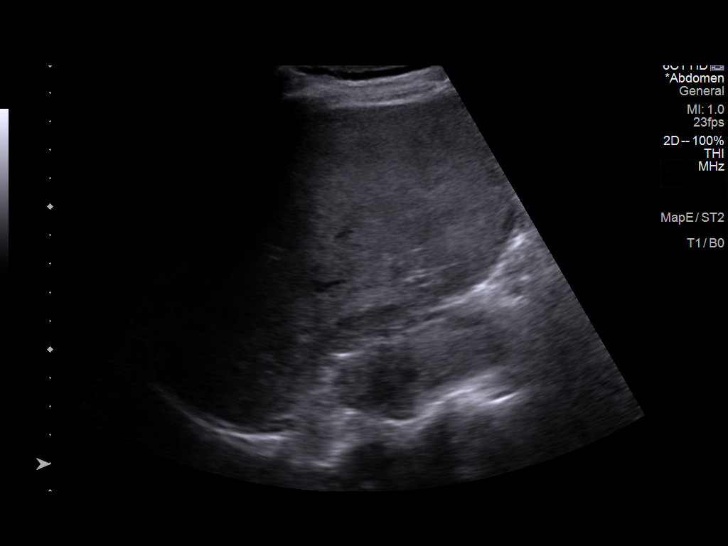
[im 19/50]
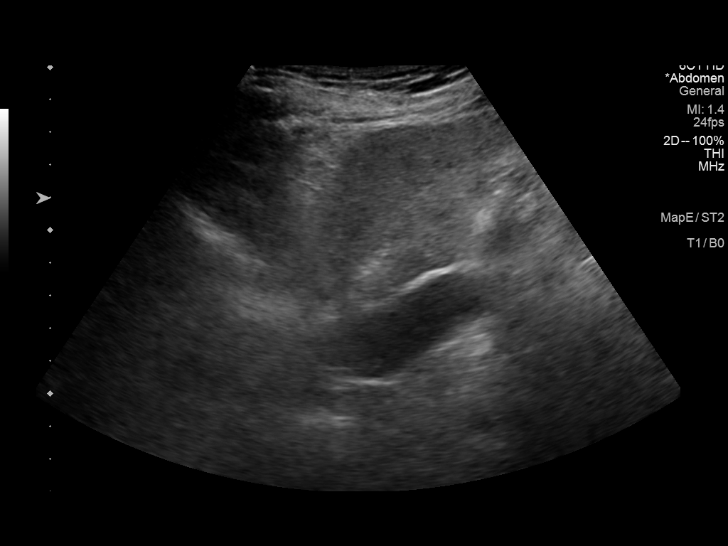
[im 23/50]
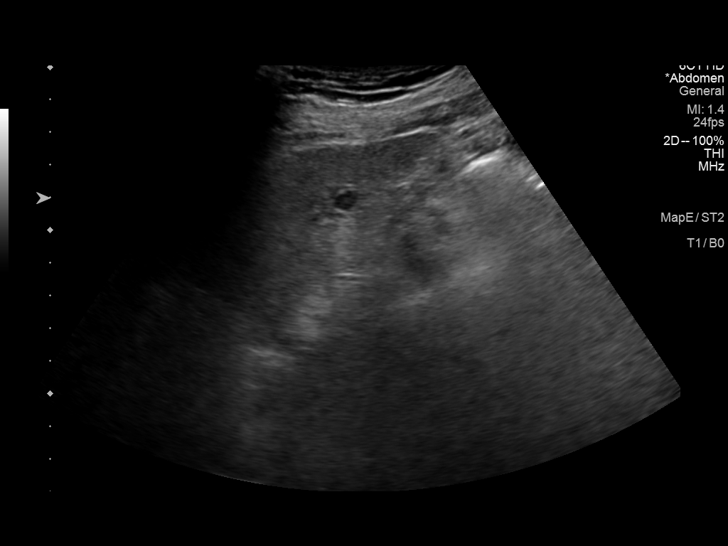
[im 27/50]
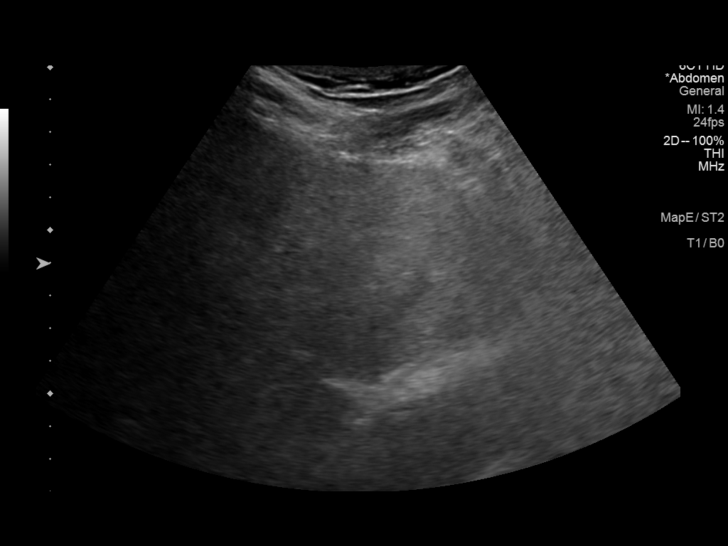
[im 31/50]
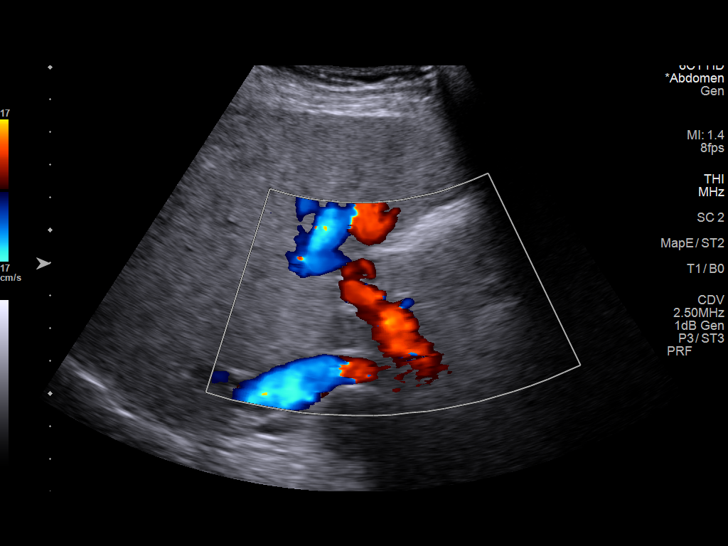
[im 33/50]
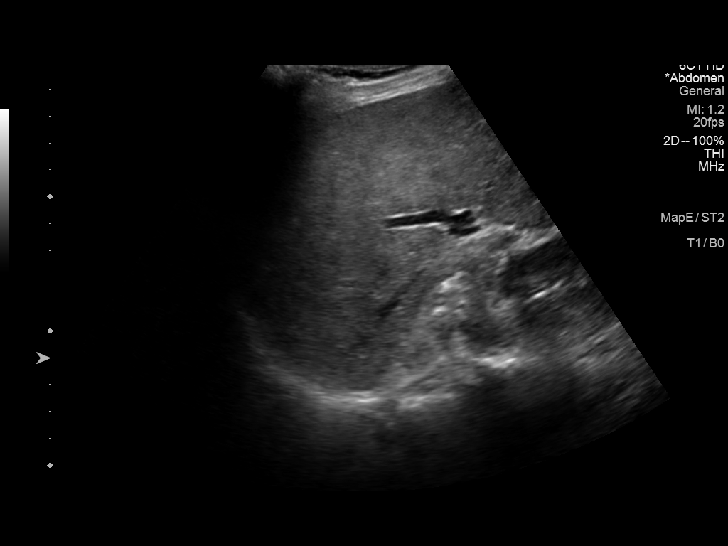
[im 37/50]
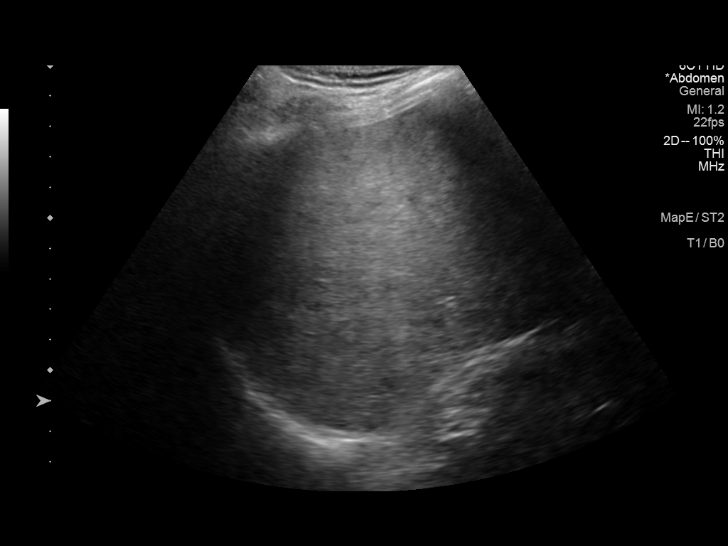
[im 41/50]
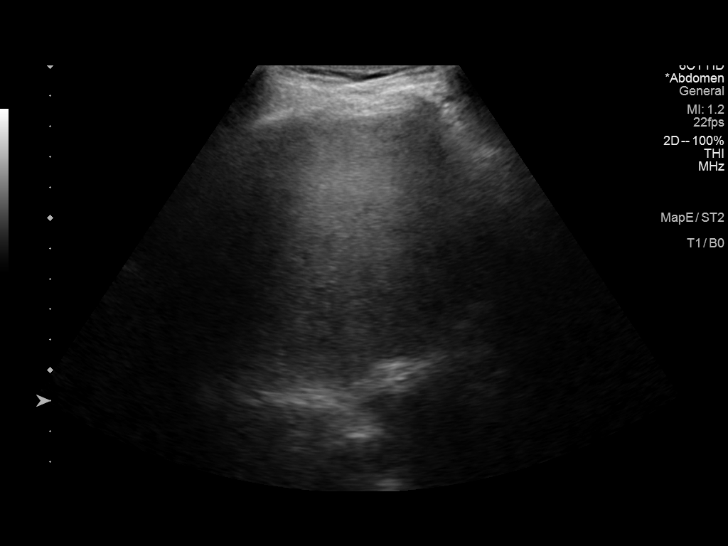
[im 45/50]
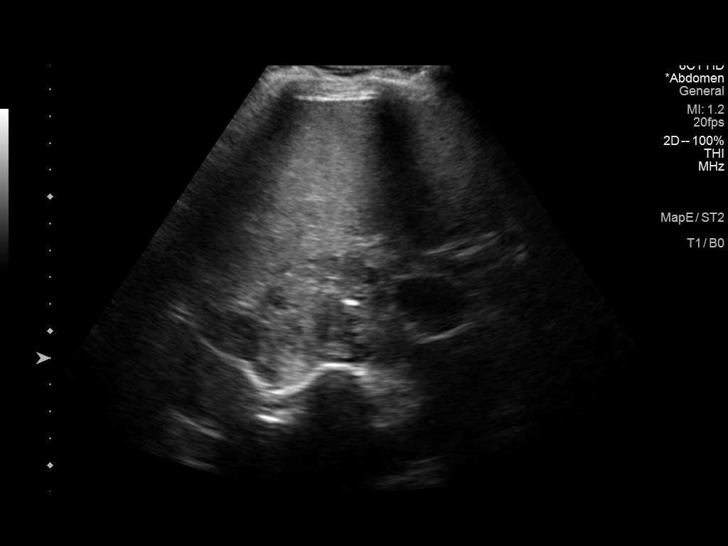
[im 50/50]
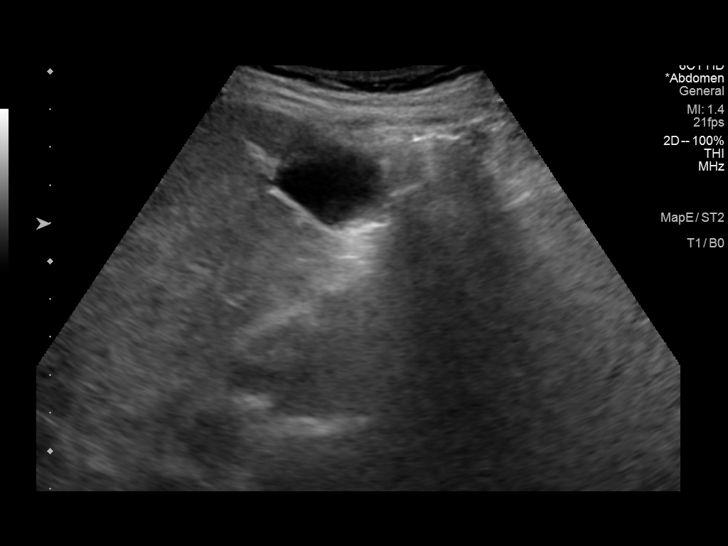

[14 of 25 positions shown; findings below may reference images not displayed]

FINDINGS: Gallbladder:

No gallstones or wall thickening visualized. No sonographic Murphy
sign noted by sonographer.

Common bile duct:

Diameter: 4 mm

Liver:

Coarsened hepatic echotexture with increased echogenicity and
surface nodularity. Small hypoechoic lesion left lobe liver mass
which measures 8 x 6 x 6 mm. Portal vein is patent on color Doppler
imaging with normal direction of blood flow towards the liver.

Other: Incompletely visualized heterogeneously echoic
retroperitoneal mass abutting the posterior right liver capsule in
the right nephrectomy bed.
IMPRESSION: 1.  Cirrhotic hepatic morphology with trace perihepatic ascites.

2. New 8 mm hypoechoic lesion in the left lobe of the liver, which
is indeterminate and for which better characterization with
contrasted CT or MRI is recommended.

3. Incompletely visualized retroperitoneal mass abutting the
posterior right liver capsule in the right nephrectomy bed, which
likely corresponds with the mass seen on prior CT abdomen.

## 2022-11-26 IMAGING — US US RENAL
1 series · 13 of 25 positions shown · non-contrast
Comparison: Ultrasound 06/22/2020, CT 01/12/2019

CLINICAL DATA: Acute renal failure, history of right nephrectomy
for renal malignancy, history of hypertension

EXAM:
RENAL / URINARY TRACT ULTRASOUND COMPLETE

[Series 1: us renal · 13 of 36 slices shown]
[im 1/36]
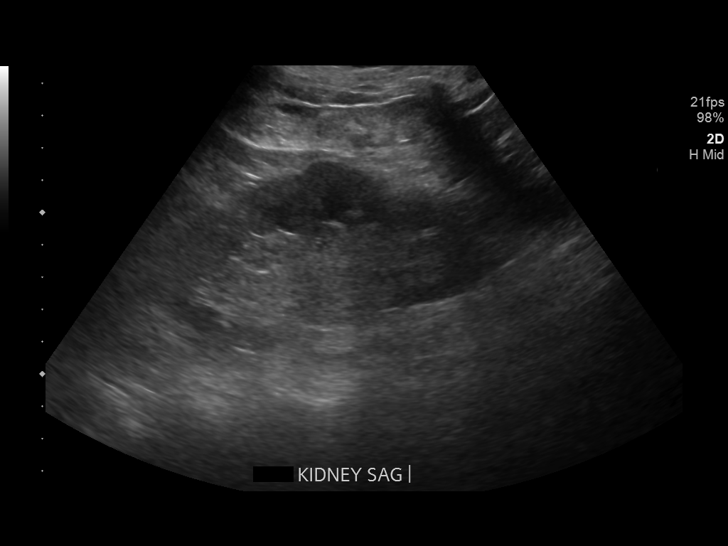
[im 3/36]
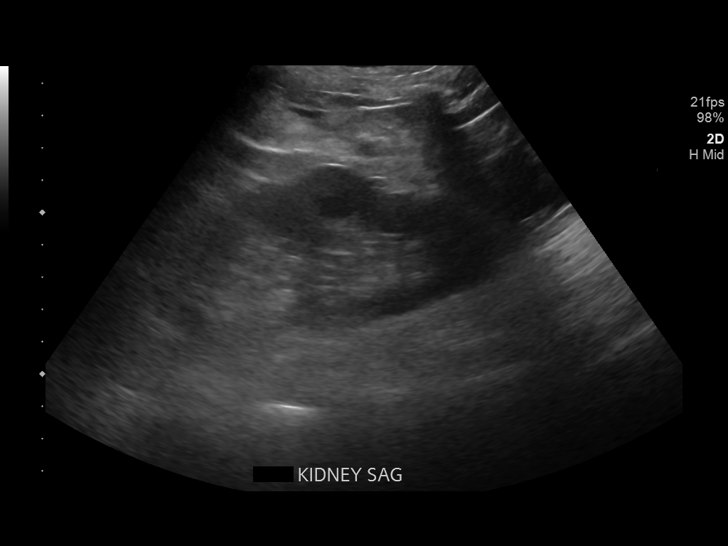
[im 6/36]
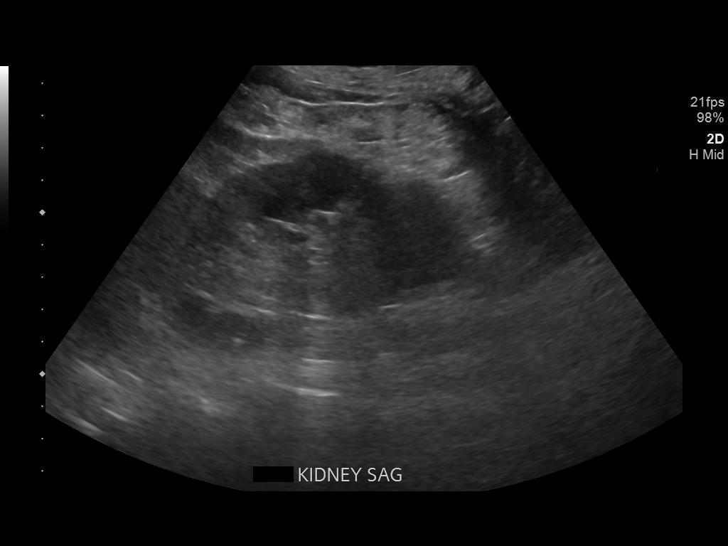
[im 9/36]
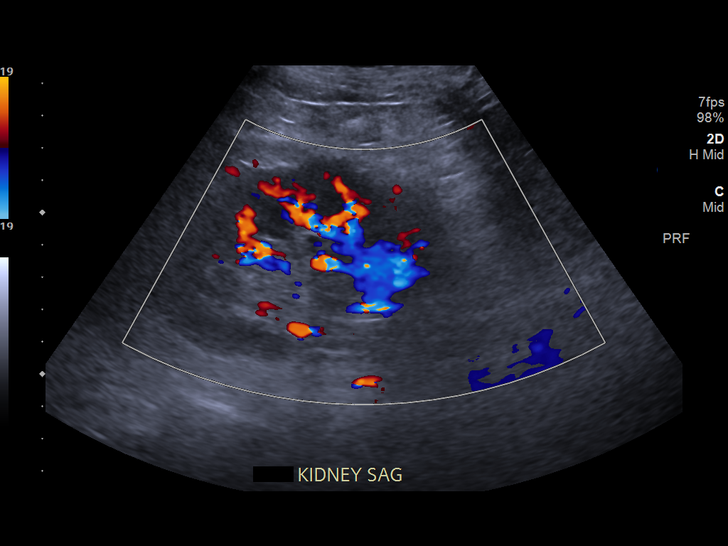
[im 12/36]
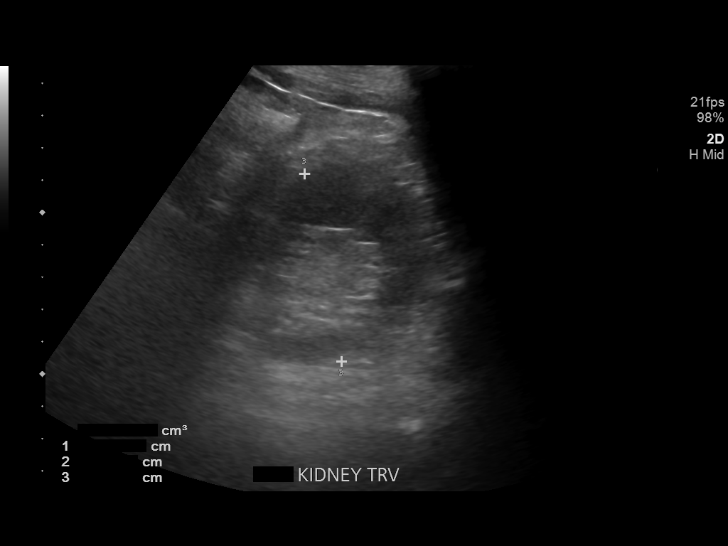
[im 15/36]
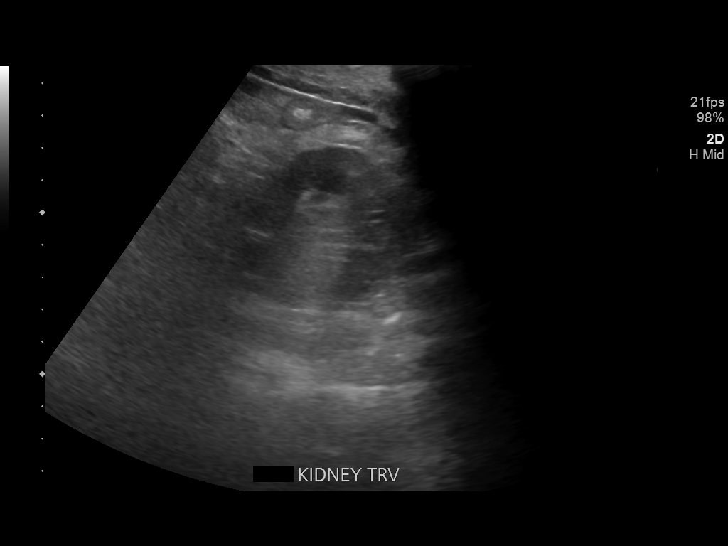
[im 18/36]
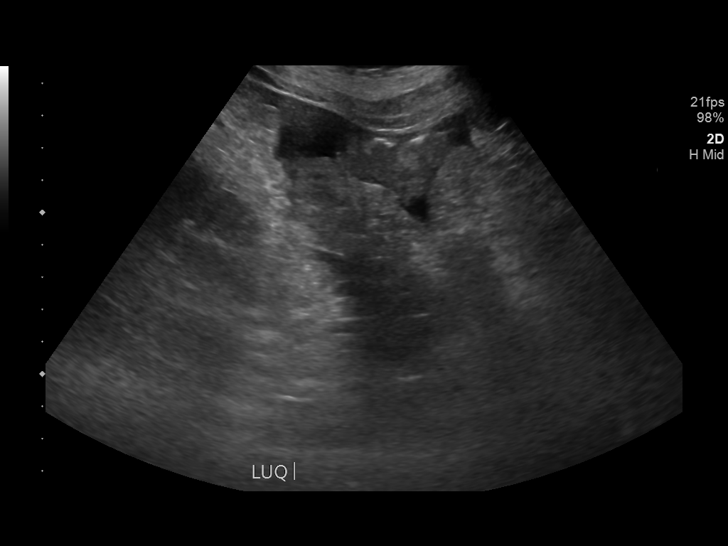
[im 21/36]
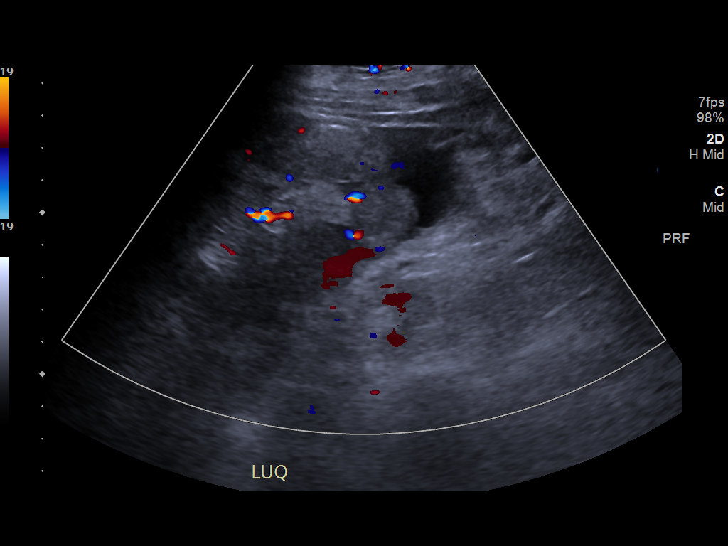
[im 24/36]
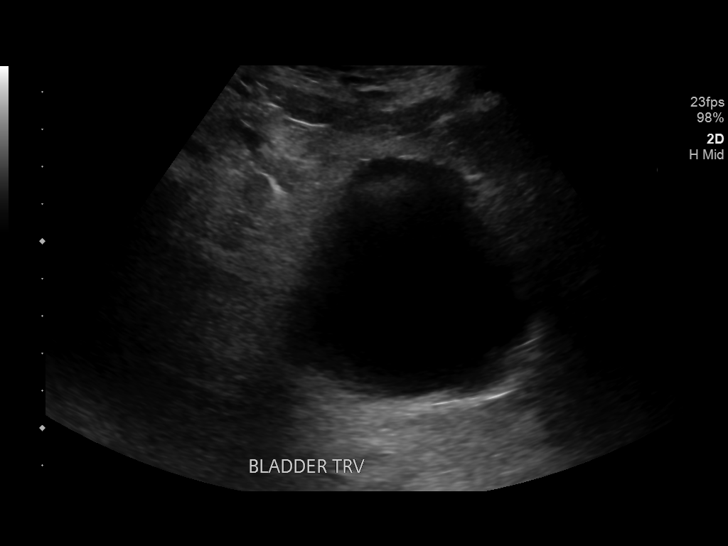
[im 27/36]
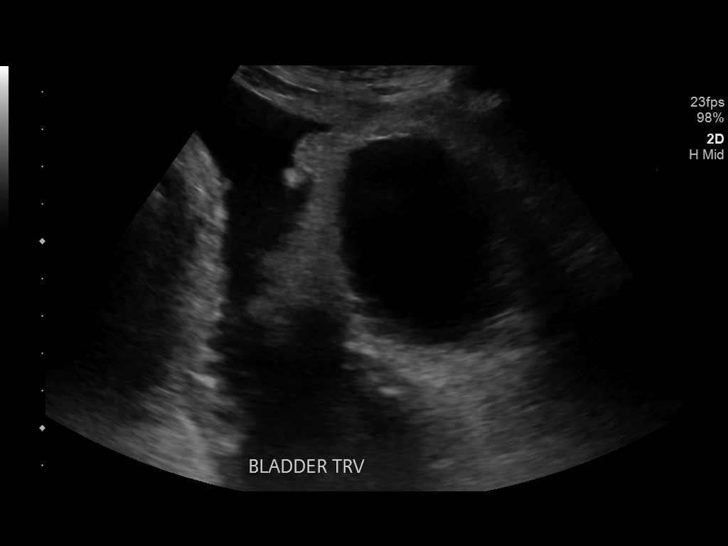
[im 30/36]
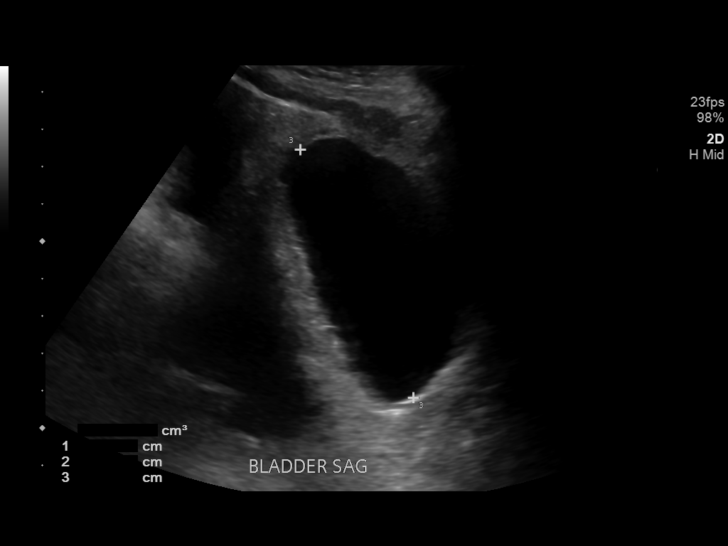
[im 33/36]
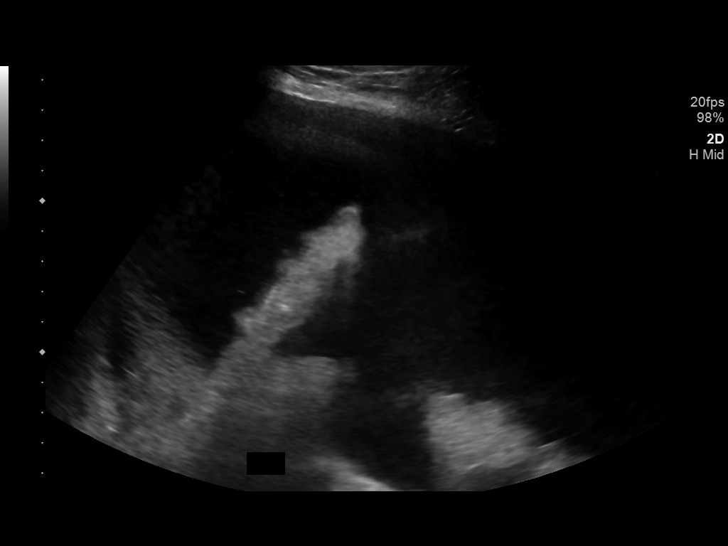
[im 36/36]
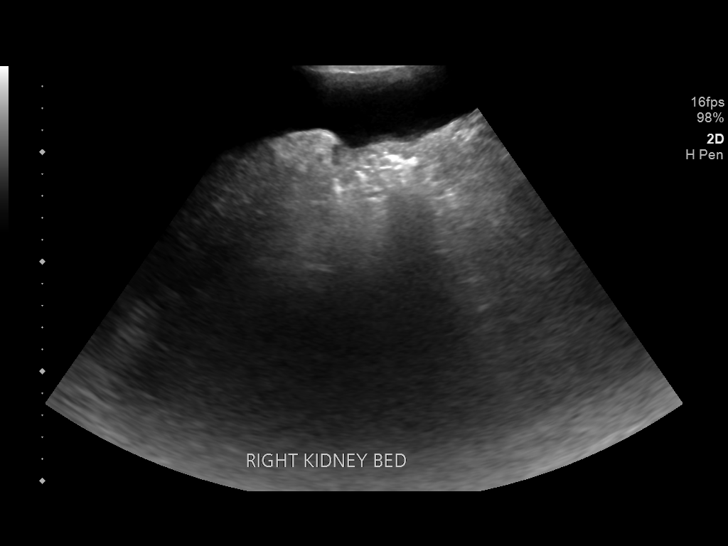

[13 of 25 positions shown; findings below may reference images not displayed]

FINDINGS: Right Kidney:

Surgically absent. No gross abnormality in the right nephrectomy bed
aside from the presence of upper abdominal ascites.

Left Kidney:

Renal measurements: 11.2 x 5.2 x 5.9 cm = volume: 180.6 mL.
Echogenicity is within normal limits. No concerning renal mass,
shadowing calculus or hydronephrosis.

Bladder:

Some mild bladder wall thickening is nonspecific, could correlate
for features of cystitis. Patient noted a were unable to void
despite the prevoid volume of 84 mL.

Other:

Moderate volume ascites throughout the abdomen. Cirrhotic morphology
of the imaged portions of the liver tip.
IMPRESSION: 1. Status post right nephrectomy. No gross abnormality in the right
nephrectomy bed.
2. Unremarkable sonographic appearance of the left kidney.
3. Mild bladder wall thickening is nonspecific, correlate for
features of cystitis.
4. Patient noted in inability to void despite a prevoid volume of 84
mL. Correlate for clinical features of urinary retention.
5. Moderate volume ascites throughout the abdomen. Cirrhotic
appearance of the included portions of the liver.
